# Patient Record
Sex: Female | Born: 1965 | Race: Black or African American | Hispanic: No | Marital: Married | Smoking: Current every day smoker
Health system: Southern US, Community
[De-identification: ages and names within clinical notes are randomized; demographics above are authoritative.]

## PROBLEM LIST (undated history)

## (undated) ENCOUNTER — Emergency Department (HOSPITAL_COMMUNITY): Admission: EM | Payer: Self-pay | Source: Home / Self Care

## (undated) DIAGNOSIS — I10 Essential (primary) hypertension: Secondary | ICD-10-CM

## (undated) DIAGNOSIS — E039 Hypothyroidism, unspecified: Secondary | ICD-10-CM

## (undated) DIAGNOSIS — R87629 Unspecified abnormal cytological findings in specimens from vagina: Secondary | ICD-10-CM

## (undated) DIAGNOSIS — E7800 Pure hypercholesterolemia, unspecified: Secondary | ICD-10-CM

## (undated) DIAGNOSIS — I639 Cerebral infarction, unspecified: Secondary | ICD-10-CM

## (undated) HISTORY — DX: Unspecified abnormal cytological findings in specimens from vagina: R87.629

## (undated) HISTORY — PX: DILATION AND CURETTAGE OF UTERUS: SHX78

---

## 2001-10-21 ENCOUNTER — Emergency Department (HOSPITAL_COMMUNITY): Admission: EM | Admit: 2001-10-21 | Discharge: 2001-10-21 | Payer: Self-pay | Admitting: Emergency Medicine

## 2003-07-05 ENCOUNTER — Emergency Department (HOSPITAL_COMMUNITY): Admission: EM | Admit: 2003-07-05 | Discharge: 2003-07-05 | Payer: Self-pay | Admitting: Emergency Medicine

## 2003-08-28 ENCOUNTER — Emergency Department (HOSPITAL_COMMUNITY): Admission: EM | Admit: 2003-08-28 | Discharge: 2003-08-28 | Payer: Self-pay | Admitting: Emergency Medicine

## 2003-09-01 ENCOUNTER — Emergency Department (HOSPITAL_COMMUNITY): Admission: EM | Admit: 2003-09-01 | Discharge: 2003-09-01 | Payer: Self-pay | Admitting: Emergency Medicine

## 2005-07-19 ENCOUNTER — Emergency Department (HOSPITAL_COMMUNITY): Admission: EM | Admit: 2005-07-19 | Discharge: 2005-07-20 | Payer: Self-pay | Admitting: Emergency Medicine

## 2008-12-31 ENCOUNTER — Emergency Department (HOSPITAL_COMMUNITY): Admission: EM | Admit: 2008-12-31 | Discharge: 2008-12-31 | Payer: Self-pay | Admitting: Emergency Medicine

## 2009-10-29 ENCOUNTER — Emergency Department (HOSPITAL_COMMUNITY): Admission: EM | Admit: 2009-10-29 | Discharge: 2009-10-30 | Payer: Self-pay | Admitting: Emergency Medicine

## 2010-01-27 ENCOUNTER — Emergency Department (HOSPITAL_COMMUNITY): Admission: EM | Admit: 2010-01-27 | Discharge: 2010-01-27 | Payer: Self-pay | Admitting: Emergency Medicine

## 2011-04-07 ENCOUNTER — Emergency Department (HOSPITAL_COMMUNITY): Payer: Self-pay

## 2011-04-07 ENCOUNTER — Inpatient Hospital Stay (HOSPITAL_COMMUNITY)
Admission: EM | Admit: 2011-04-07 | Discharge: 2011-04-10 | DRG: 065 | Disposition: A | Discharge status: Home / Self Care | Payer: Self-pay | Attending: Internal Medicine | Admitting: Internal Medicine

## 2011-04-07 ENCOUNTER — Other Ambulatory Visit: Payer: Self-pay

## 2011-04-07 DIAGNOSIS — I639 Cerebral infarction, unspecified: Secondary | ICD-10-CM | POA: Diagnosis present

## 2011-04-07 DIAGNOSIS — Z7902 Long term (current) use of antithrombotics/antiplatelets: Secondary | ICD-10-CM

## 2011-04-07 DIAGNOSIS — R29898 Other symptoms and signs involving the musculoskeletal system: Secondary | ICD-10-CM | POA: Diagnosis present

## 2011-04-07 DIAGNOSIS — Z6841 Body Mass Index (BMI) 40.0 and over, adult: Secondary | ICD-10-CM

## 2011-04-07 DIAGNOSIS — F17200 Nicotine dependence, unspecified, uncomplicated: Secondary | ICD-10-CM | POA: Diagnosis present

## 2011-04-07 DIAGNOSIS — Z7982 Long term (current) use of aspirin: Secondary | ICD-10-CM

## 2011-04-07 DIAGNOSIS — E669 Obesity, unspecified: Secondary | ICD-10-CM | POA: Diagnosis present

## 2011-04-07 DIAGNOSIS — I6529 Occlusion and stenosis of unspecified carotid artery: Secondary | ICD-10-CM | POA: Diagnosis present

## 2011-04-07 DIAGNOSIS — Z79899 Other long term (current) drug therapy: Secondary | ICD-10-CM

## 2011-04-07 DIAGNOSIS — I63239 Cerebral infarction due to unspecified occlusion or stenosis of unspecified carotid arteries: Principal | ICD-10-CM | POA: Diagnosis present

## 2011-04-07 DIAGNOSIS — I10 Essential (primary) hypertension: Secondary | ICD-10-CM | POA: Diagnosis present

## 2011-04-07 DIAGNOSIS — E785 Hyperlipidemia, unspecified: Secondary | ICD-10-CM | POA: Diagnosis present

## 2011-04-07 LAB — BASIC METABOLIC PANEL
BUN: 5 mg/dL — ABNORMAL LOW (ref 6–23)
Chloride: 105 mEq/L (ref 96–112)
GFR calc non Af Amer: >90 mL/min (ref >90)
Glucose, Bld: 94 mg/dL (ref 70–99)
Potassium: 3.5 mEq/L (ref 3.5–5.1)
Sodium: 137 mEq/L (ref 135–145)

## 2011-04-07 LAB — CBC
Hemoglobin: 12.4 g/dL (ref 12.0–15.0)
Platelets: 367 10*3/uL (ref 150–400)
RBC: 4.46 MIL/uL (ref 3.87–5.11)
WBC: 7.9 10*3/uL (ref 4.0–10.5)

## 2011-04-07 LAB — DIFFERENTIAL
Eosinophils Absolute: 0 10*3/uL (ref 0.0–0.7)
Lymphocytes Relative: 37 % (ref 12–46)
Lymphs Abs: 2.9 10*3/uL (ref 0.7–4.0)
Monocytes Relative: 5 % (ref 3–12)
Neutro Abs: 4.6 10*3/uL (ref 1.7–7.7)
Neutrophils Relative %: 58 % (ref 43–77)

## 2011-04-07 MED ORDER — ACETAMINOPHEN 325 MG PO TABS
650.0000 mg | ORAL_TABLET | ORAL | Status: DC | PRN
  Administered 2011-04-08: 650 mg via ORAL
  Filled 2011-04-07: qty 2

## 2011-04-07 MED ORDER — SENNOSIDES-DOCUSATE SODIUM 8.6-50 MG PO TABS: 1.0000 | ORAL_TABLET | Freq: Every evening | ORAL | Status: DC | PRN

## 2011-04-07 MED ORDER — IOHEXOL 350 MG/ML SOLN
80.0000 mL | Freq: Once | INTRAVENOUS | Status: AC | PRN
  Administered 2011-04-07: 80 mL via INTRAVENOUS

## 2011-04-07 MED ORDER — ASPIRIN 81 MG PO CHEW
324.0000 mg | CHEWABLE_TABLET | Freq: Once | ORAL | Status: AC
  Administered 2011-04-07: 324 mg via ORAL
  Filled 2011-04-07: qty 4

## 2011-04-07 MED ORDER — NICOTINE 7 MG/24HR TD PT24
7.0000 mg | MEDICATED_PATCH | Freq: Every day | TRANSDERMAL | Status: DC
  Administered 2011-04-07 – 2011-04-09 (×3): 7 mg via TRANSDERMAL
  Filled 2011-04-07 (×3): qty 1

## 2011-04-07 MED ORDER — SODIUM CHLORIDE 0.9 % IJ SOLN: 3.0000 mL | INTRAMUSCULAR | Status: DC | PRN

## 2011-04-07 MED ORDER — ASPIRIN 81 MG PO CHEW
324.0000 mg | CHEWABLE_TABLET | Freq: Every day | ORAL | Status: DC
  Administered 2011-04-08: 324 mg via ORAL
  Filled 2011-04-07: qty 4

## 2011-04-07 MED ORDER — SODIUM CHLORIDE 0.9 % IV SOLN: 250.0000 mL | INTRAVENOUS | Status: DC | PRN

## 2011-04-07 MED ORDER — SODIUM CHLORIDE 0.9 % IJ SOLN
3.0000 mL | Freq: Two times a day (BID) | INTRAMUSCULAR | Status: DC
  Administered 2011-04-07 – 2011-04-09 (×4): 3 mL via INTRAVENOUS

## 2011-04-07 MED ORDER — ACETAMINOPHEN 650 MG RE SUPP: 650.0000 mg | RECTAL | Status: DC | PRN

## 2011-04-07 NOTE — ED Notes (Signed)
Complain of weakness in left arm 2 days

## 2011-04-07 NOTE — ED Notes (Signed)
Family at bedside. EDP speaking with pt's daughter at this time

## 2011-04-07 NOTE — ED Notes (Signed)
Dr. Onalee Hua has spoken with pt and pt now to be transported to ct

## 2011-04-07 NOTE — H&P (Signed)
Chief Complaint:  Left arm and hand weakness  HPI: 46 year old African American female with only a questionable history of hypertension in the past but is not taking any medications and tobacco abuse who presents to the emergency department after noticing some left upper extremity weakness and numbness since yesterday morning. She has never previously had any cardiovascular disease she states that she's having difficulty in grasping things with her left hand. She denies any facial drooping or slurred speech. She denies any problems with her gait or any weakness or numbness in her lower extremities. Her right upper extremity has been functioning normally. She denies any blurred vision any difficulty in swallowing or any headache. She denies any recent illnesses no fever, nausea, vomiting, diarrhea, rashes.  Review of Systems:  Otherwise negative  Past Medical History: History reviewed. No pertinent past medical history. History reviewed. No pertinent past surgical history.  Medications: Prior to Admission medications   Not on File    Allergies:  No Known Allergies  Social History:  reports that she has been smoking Cigarettes.  She does not have any smokeless tobacco history on file. She reports that she does not drink alcohol or use illicit drugs.  Family History: History reviewed. No pertinent family history.  Physical Exam: Filed Vitals:   04/07/11 1322 04/07/11 1844 04/07/11 1845 04/07/11 1945  BP: 166/98  151/99 171/90  Pulse: 88  84 85  Temp: 98.4 F (36.9 C) 98 F (36.7 C) 98 F (36.7 C)   TempSrc: Oral  Oral   Resp: 20   18  Height: 5\' 6"  (1.676 m)     Weight: 117.935 kg (260 lb)     SpO2: 98%  100% 100%   BP 171/90  Pulse 85  Temp(Src) 98 F (36.7 C) (Oral)  Resp 18  Ht 5\' 6"  (1.676 m)  Wt 117.935 kg (260 lb)  BMI 41.97 kg/m2  SpO2 100%  LMP 03/27/2011 General appearance: alert, cooperative and no distress Lungs: clear to auscultation bilaterally Heart:  regular rate and rhythm, S1, S2 normal, no murmur, click, rub or gallop Abdomen: soft, non-tender; bowel sounds normal; no masses,  no organomegaly Extremities: extremities normal, atraumatic, no cyanosis or edema Pulses: 2+ and symmetric Skin: Skin color, texture, turgor normal. No rashes or lesions Neurologic: Mental status: Alert, oriented, thought content appropriate Cranial nerves: normal Motor: decreased strength lue 4/5 with decreased hand grasp compared to right arm(s) left  Reflexes: 2+ and symmetric    Labs on Admission:   [facility] 04/07/11 1712  NA 137  K 3.5  CL 105  CO2 27  GLUCOSE 94  BUN 5*  CREATININE 0.67  CALCIUM 10.1  MG --  PHOS --    Basename 04/07/11 1712  WBC 7.9  NEUTROABS 4.6  HGB 12.4  HCT 39.0  MCV 87.4  PLT 367    Radiological Exams on Admission: Mr Brain Wo Contrast  04/07/2011  *RADIOLOGY REPORT*  Clinical Data: Weakness of the left arm and leg for 2 days.  MRI HEAD WITHOUT CONTRAST  Technique:  Multiplanar, multiecho pulse sequences of the brain and surrounding structures were obtained according to standard protocol without intravenous contrast.  Comparison: None.  Findings: There is right common carotid occlusion with an absence of flow in that vessel.  There are areas of acute infarction affecting the right basal ganglia, anterior limb internal capsule, scattered foci within the right middle cerebral artery territory and watershed infarctions along the margins of the anterior and middle cerebral arteries on  the right.  There is no abnormality of the brainstem or cerebellum.  There is no abnormality of the left cerebral hemisphere.  No evidence of hemorrhage.  No significant swelling or mass effect/shift.  No mass lesion, hydrocephalus or extra-axial collection.  No pituitary mass.  Sinuses, middle ears and mastoids are clear.  IMPRESSION: Right carotid artery occlusion. Acute watershed zone infarctions throughout the right hemisphere along the  margins of the anterior and middle cerebral artery territories.  Scattered small cortical infarctions in the right middle cerebral artery territory.  Acute infarction in the right basal ganglia and anterior limb internal capsule.  Original Report Authenticated By: Thomasenia Sales, M.D.    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 EDP Dr. Garnetta Buddy has already notified neurology Dr. Pearlean Brownie at [facility] who recommended patient to be transferred there for further evaluation and treatment. We'll obtain frequent neurological checks placed on telemetry floor. She is currently getting a CTA of her head and neck here at [facility].  to better evaluate any stenosis. I have written orders for the [facility] staff to notify both [facility] hospitalist service and neurology service on her arrival to their floor. I've also spoken to triad hospitalist at [facility] about her case.  .Carotid artery occlusion CTA head and neck pending further recommendations per neurology service at [facility].  .Left hand weakness .HTN (hypertension) parameters have been placed would not aggressively decreased at this point due to the above issues.  Patient be on [place] team  #4 and is a full code and is currently hemodynamically stable.   Sherilynn Dieu A [phone] 04/07/2011, 8:35 PM

## 2011-04-07 NOTE — ED Notes (Signed)
Pt left shoes, [facility] had already left so shoes given to pt's daughter

## 2011-04-07 NOTE — ED Notes (Signed)
Report taken from [place], pt requesting to go outside and smoke, pt advised of no smoking policy EDP in to speak with pt

## 2011-04-07 NOTE — ED Notes (Signed)
Pt still unable to move left arm

## 2011-04-07 NOTE — ED Provider Notes (Signed)
History     CSN: [id number]  Arrival date & time 04/07/11  1305   First MD Initiated Contact with Patient 04/07/11 1723      Chief Complaint  Patient presents with  . Arm Pain    Patient is a 46 y.o. female presenting with extremity weakness. The history is provided by the patient.  Extremity Weakness This is a new problem. The current episode started yesterday. The problem occurs constantly. The problem has been gradually worsening. Associated symptoms include headaches. Pertinent negatives include no chest pain, no abdominal pain and no shortness of breath. The symptoms are aggravated by nothing. The symptoms are relieved by nothing.  Pt reports that she woke up yesterday morning with left UE weakness No numbness No trauma/fall No neck pain No back pain No cp/sob No leg weakness No numbness to left UE, just reported difficulty gripping with left hand Never had this before Denies h/o CAD/CVA   PMH - none  History reviewed. No pertinent past surgical history.  History reviewed. No pertinent family history.  History  Substance Use Topics  . Smoking status: Current Everyday Smoker    Types: Cigarettes  . Smokeless tobacco: Not on file  . Alcohol Use: No    OB History    Grav Para Term Preterm Abortions TAB SAB Ect Mult Living                  Review of Systems  Respiratory: Negative for shortness of breath.   Cardiovascular: Negative for chest pain.  Gastrointestinal: Negative for abdominal pain.  Musculoskeletal: Positive for extremity weakness.  Neurological: Positive for headaches.  All other systems reviewed and are negative.    Allergies  Review of patient's allergies indicates no known allergies.  Home Medications  No current outpatient prescriptions on file.  BP 166/98  Pulse 88  Temp(Src) 98.4 F (36.9 C) (Oral)  Resp 20  Ht 5\' 6"  (1.676 m)  Wt 260 lb (117.935 kg)  BMI 41.97 kg/m2  SpO2 98%  LMP 03/27/2011  Physical  Exam  CONSTITUTIONAL: Well developed/well nourished HEAD AND FACE: Normocephalic/atraumatic EYES: EOMI/PERRL ENMT: Mucous membranes moist NECK: supple no meningeal signs SPINE:entire spine nontender, mild cspine tenderness with ROM of neck CV: S1/S2 noted, no murmurs/rubs/gallops noted LUNGS: Lungs are clear to auscultation bilaterally, no apparent distress ABDOMEN: soft, nontender, no rebound or guarding GU:no cva tenderness NEURO: Pt is awake/alert, gait normal  facies symmetric, no leg drift is noted Cranial nerves 3/4/5/6/10/10/08/11/12 tested and intact No visual deficit is noted She has some drift of left UE against gravity.  She equal power with left elbow flexion/extension.  She has some weakness noted with grip of left hand and is unable to fully extend left 4th/5th finger due to weakness There is no sensory deficit in the left UE EXTREMITIES: pulses normal/equal, full ROM SKIN: warm, color normal PSYCH: no abnormalities of mood noted   ED Course  Procedures    Labs Reviewed  CBC  DIFFERENTIAL  BASIC METABOLIC PANEL   4:09 PM tPA in stroke considered but not given due to:  Onset over 3-4.5hours  Pt without any other neurologic symptoms except weakness in left UE.  ? Cervical radiculopathy, will obtain mr brain/cspine  7:06 PM Mr brain shows CVA, likely right carotid occlusion Pt has no new symptoms Vascular paged, will also likely need neuro consult  7:14 PM D/w vascular, no acute intervention at this point would defer to neuro at this point Awaiting call back from neuro  7:37 PM D/w dr Pearlean Brownie, neuro can be admitted to [facility] via hospitalist, obtain CT head/neck angio with/without contrast  7:46 PM D/w triad, will admit/transfer to [facility]  MDM  Nursing notes reviewed and considered in documentation All labs/vitals reviewed and considered        Date: 04/07/2011  Rate: 81  Rhythm: normal sinus rhythm  QRS Axis: normal  Intervals: normal  ST/T  Wave abnormalities: normal  Conduction Disutrbances:none  Narrative Interpretation:      Joya Gaskins, MD 04/07/11 1946

## 2011-04-08 ENCOUNTER — Inpatient Hospital Stay (HOSPITAL_COMMUNITY): Payer: Self-pay

## 2011-04-08 LAB — HEMOGLOBIN A1C
Hgb A1c MFr Bld: 5.2 % (ref <5.7)
Mean Plasma Glucose: 103 mg/dL (ref <117)

## 2011-04-08 LAB — LIPID PANEL
HDL: 37 mg/dL — ABNORMAL LOW (ref >39)
Triglycerides: 114 mg/dL (ref <150)

## 2011-04-08 MED ORDER — CLOPIDOGREL BISULFATE 75 MG PO TABS
75.0000 mg | ORAL_TABLET | Freq: Every day | ORAL | Status: DC
  Administered 2011-04-09 – 2011-04-10 (×2): 75 mg via ORAL
  Filled 2011-04-08 (×3): qty 1

## 2011-04-08 MED ORDER — SIMVASTATIN 40 MG PO TABS
40.0000 mg | ORAL_TABLET | Freq: Every day | ORAL | Status: DC
  Administered 2011-04-08 – 2011-04-09 (×2): 40 mg via ORAL
  Filled 2011-04-08 (×3): qty 1

## 2011-04-08 MED ORDER — ASPIRIN 81 MG PO CHEW
81.0000 mg | CHEWABLE_TABLET | Freq: Every day | ORAL | Status: DC
  Administered 2011-04-09 – 2011-04-10 (×2): 81 mg via ORAL
  Filled 2011-04-08 (×2): qty 1

## 2011-04-08 NOTE — Progress Notes (Signed)
Physical Therapy Evaluation Patient Details Name: [name] MRN: [id number] DOB: 03/15/1966 Today's Date: 04/08/2011  Problem List:  Patient Active Problem List  Diagnoses  . CVA (cerebral infarction)  . Carotid artery occlusion  . Left hand weakness  . HTN (hypertension)    Past Medical History: History reviewed. No pertinent past medical history. Past Surgical History: History reviewed. No pertinent past surgical history.  PT Assessment/Plan/Recommendation PT Assessment Clinical Impression Statement: Patient is a 46 yo female admitted with right CVA with left hemiparesis.  Patient with decerased balance and gait abnormality.  Recommend follow-up OP PT to address these issues. PT Recommendation/Assessment: Patient will need skilled PT in the acute care venue PT Problem List: Decreased strength;Decreased balance;Decreased coordination;Decreased knowledge of use of DME PT Therapy Diagnosis : Abnormality of gait;Hemiplegia non-dominant side PT Plan PT Frequency: Min 4X/week PT Treatment/Interventions: DME instruction;Gait training;Stair training;Functional mobility training;Balance training;Patient/family education PT Recommendation Follow Up Recommendations: Outpatient PT Equipment Recommended: Cane PT Goals  Acute Rehab PT Goals PT Goal Formulation: With patient Time For Goal Achievement: 7 days Pt will go Supine/Side to Sit: Independently;with HOB 0 degrees PT Goal: Supine/Side to Sit - Progress: Not met Pt will go Sit to Stand: with modified independence PT Goal: Sit to Stand - Progress: Not met Pt will go Stand to Sit: with modified independence PT Goal: Stand to Sit - Progress: Not met Pt will Ambulate: >150 feet;with modified independence;with least restrictive assistive device (without loss of balance) PT Goal: Ambulate - Progress: Not met Pt will Go Up / Down Stairs: 3-5 stairs;with supervision;with least restrictive assistive device PT Goal: Up/Down Stairs -  Progress: Not met  PT Evaluation Precautions/Restrictions  Precautions Precautions: Fall Required Braces or Orthoses: No Restrictions Weight Bearing Restrictions: No Prior Functioning  Home Living Lives With: Family Type of Home: House Home Layout: One level Home Access: Stairs to enter Entrance Stairs-Rails: Right Entrance Stairs-Number of Steps: 3 Bathroom Shower/Tub: Forensic scientist: Standard Bathroom Accessibility: Yes How Accessible: Accessible via wheelchair;Accessible via walker Home Adaptive Equipment: None Prior Function Level of Independence: Independent with basic ADLs;Independent with homemaking with ambulation;Independent with gait Driving: Yes Vocation: Full time employment Cognition Cognition Arousal/Alertness: Awake/alert Overall Cognitive Status: Appears within functional limits for tasks assessed Orientation Level: Oriented X4 Cognition - Other Comments: Flat affect Sensation/Coordination Sensation Light Touch: Appears Intact Coordination Gross Motor Movements are Fluid and Coordinated: No Coordination and Movement Description: Decreased control of LLE during gait.  See OT note for LUE Extremity Assessment LUE Assessment LUE Assessment:  (See OT note) RLE Assessment RLE Assessment: Within Functional Limits LLE Assessment LLE Assessment: Within Functional Limits (Decreased strength/control hip/knee in closed chain) Mobility (including Balance) Bed Mobility Bed Mobility: No Transfers Transfers: Yes Sit to Stand: 5: Supervision;With upper extremity assist;With armrests;From chair/3-in-1 Sit to Stand Details (indicate cue type and reason): Cues to move slowly for safety Stand to Sit: 5: Supervision;With upper extremity assist;With armrests;To chair/3-in-1 Stand to Sit Details: Patient "flopped" back into chair.  Discussed safety and need to slowly lower self to chair. Ambulation/Gait Ambulation/Gait: Yes Ambulation/Gait  Assistance: 4: Min assist Ambulation/Gait Assistance Details (indicate cue type and reason): Patient requiring min assist to avoid obstacles in hallway.  Patient ran into wall computers, door jam, and bedside table during gait.  States she could see objects, but had trouble controlling gait to miss objects.  Patient also with decreased control of left knee during stance - knee "popping" back into extension.  Also noted hip instability on left.  Ambulation Distance (Feet): 250 Feet Assistive device: None Gait Pattern: Step-through pattern;Decreased stance time - left;Lateral hip instability (Decreased control left knee in stance) Gait velocity: Slow gait speed  Posture/Postural Control Posture/Postural Control: No significant limitations High Level Balance High Level Balance Activites: Direction changes;Turns;Sudden stops;Head turns High Level Balance Comments: Decrease balance and alteration of gait during high level balance activities Exercise    End of Session PT - End of Session Equipment Utilized During Treatment: Gait belt Activity Tolerance: Patient tolerated treatment well Patient left: in chair;with call bell in reach Nurse Communication: Mobility status for ambulation General Behavior During Session: [facility] for tasks performed Cognition: [facility] for tasks performed  Elizabeth, Frost 04/08/2011, 4:58 PM

## 2011-04-08 NOTE — Progress Notes (Signed)
Nursing- Pt arrived via [facility]. VSS. Pt in no signs of distress. Assessment with NIH completed. MD notified of pt arrival per order. Patient oriented to room and placed on telemetry. Pt continued to ask if she could have a cigarette. I made her aware of the anti-smoking policy, and informed her I would try to get a nicotine patch ordered from the MD.  Pt resting comfortably in bed. Will cont to monitor

## 2011-04-08 NOTE — Progress Notes (Signed)
Utilization review completed. [name] 04/08/2011

## 2011-04-08 NOTE — Progress Notes (Signed)
Subjective: [name] was admitted with left arm weakness. Left arm is still weak but seems to be somewhat better. 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 She is a smoker. She is obese. She is hypertensive. She is hyperlipidemic.           Physical Exam: Blood pressure 142/94, pulse 68, temperature 98 F (36.7 C), temperature source Oral, resp. rate 18, height 5\' 6"  (1.676 m), weight 117.935 kg (260 lb), last menstrual period 03/27/2011, SpO2 100.00%. She does look systemically well. She is alert and orientated. She does have left arm weakness and a clear pronator drift. The left leg is normal strength. There is no facial weakness. Heart sounds are present and normal. Lung fields are clear.   Investigations:     Basic Metabolic Panel:  Basename 04/07/11 1712  NA 137  K 3.5  CL 105  CO2 27  GLUCOSE 94  BUN 5*  CREATININE 0.67  CALCIUM 10.1  MG --  PHOS --       CBC:  Basename 04/07/11 1712  WBC 7.9  NEUTROABS 4.6  HGB 12.4  HCT 39.0  MCV 87.4  PLT 367    Ct Angio Head W/cm &/or Wo Cm  04/08/2011  *RADIOLOGY REPORT*  Clinical Data:  The right carotid occlusion.  Left arm weakness.  CT ANGIOGRAPHY HEAD AND NECK  Technique:  Multidetector CT imaging of the head and neck was performed using the standard protocol during bolus administration of intravenous contrast.  Multiplanar CT image reconstructions including MIPs were obtained to evaluate the vascular anatomy. Carotid stenosis measurements (when applicable) are obtained utilizing NASCET criteria, using the distal internal carotid diameter as the denominator.  Contrast: 80mL OMNIPAQUE IOHEXOL 350 MG/ML IV SOLN  Comparison:  MRI brain 04/06/2010.  CTA NECK  Findings: The left vertebral artery originates from the aortic arch, a normal variant.  The right vertebral artery originates from the subclavian.  There is some streak artifact across the arch area is secondary patient body habitus.  No focal  stenosis is evident.  The vertebral arteries are within normal limits throughout the remainder of the neck.  The right vertebral artery is slightly dominant to the left.  No focal stenosis is evident.  The right common carotid artery is within normal limits.  The right internal carotid artery is occluded 8 mm from the bifurcation. There is no proximal reconstitution.  The external carotid artery is within normal limits.  The left common carotid artery is within normal limits.  The bifurcation is unremarkable.  The left internal carotid artery is normal throughout the neck.  Mild degenerative changes are present at C5-6 without significant stenosis.   Review of the MIP images confirms the above findings.  IMPRESSION:  1.  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 2.  The left vertebral artery originates from the aortic arch, a normal variant. 3.  Normal appearance of the left carotid artery.  CTA HEAD  Findings:  The right caudate head and watershed territory infarcts are redemonstrated.  No hemorrhage or mass lesion is present.  The right internal carotid artery is occluded.  It is reconstituted at the level of the ophthalmic artery.  The A1 and M1 segments fill normally on both sides.  The MCA bifurcations are within normal limits.  There is significant attenuation of MCA branch vessels bilaterally, more prominently on the right.  The right vertebral artery is the dominant vessel.  The basilar artery is within normal  limits.  The PICA origins are not discretely visualized.  Both posterior cerebral arteries originate from basilar tip.  No left posterior communicating artery is present.  No definite right posterior communicating artery is seen. The there is some attenuation of distal PCA branch vessels bilaterally.   Review of the MIP images confirms the above findings.  IMPRESSION:  1.  Occluded right internal carotid artery is reconstituted at the level of the ophthalmic artery. 2.   Moderate small vessel disease. 3.  Significant asymmetric attenuation of right MCA branches versus the left, compatible with diminished perfusion.  Original Report Authenticated By: Jamesetta Orleans. MATTERN, M.D.   Dg Chest 2 View  04/08/2011  *RADIOLOGY REPORT*  Clinical Data: Headache.  Stroke.  CHEST - 2 VIEW  Comparison: 04/08/2011  Findings: Heart size is normal.  No pleural effusion or pulmonary edema.  There is no airspace consolidation identified.  Review of the visualized osseous structures is unremarkable.  IMPRESSION:  1.  No active cardiopulmonary abnormalities.  Original Report Authenticated By: Rosealee Albee, M.D.   Ct Angio Neck W/cm &/or Wo/cm  04/08/2011  *RADIOLOGY REPORT*  Clinical Data:  The right carotid occlusion.  Left arm weakness.  CT ANGIOGRAPHY HEAD AND NECK  Technique:  Multidetector CT imaging of the head and neck was performed using the standard protocol during bolus administration of intravenous contrast.  Multiplanar CT image reconstructions including MIPs were obtained to evaluate the vascular anatomy. Carotid stenosis measurements (when applicable) are obtained utilizing NASCET criteria, using the distal internal carotid diameter as the denominator.  Contrast: 80mL OMNIPAQUE IOHEXOL 350 MG/ML IV SOLN  Comparison:  MRI brain 04/06/2010.  CTA NECK  Findings: The left vertebral artery originates from the aortic arch, a normal variant.  The right vertebral artery originates from the subclavian.  There is some streak artifact across the arch area is secondary patient body habitus.  No focal stenosis is evident.  The vertebral arteries are within normal limits throughout the remainder of the neck.  The right vertebral artery is slightly dominant to the left.  No focal stenosis is evident.  The right common carotid artery is within normal limits.  The right internal carotid artery is occluded 8 mm from the bifurcation. There is no proximal reconstitution.  The external carotid artery  is within normal limits.  The left common carotid artery is within normal limits.  The bifurcation is unremarkable.  The left internal carotid artery is normal throughout the neck.  Mild degenerative changes are present at C5-6 without significant stenosis.   Review of the MIP images confirms the above findings.  IMPRESSION:  1.  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 2.  The left vertebral artery originates from the aortic arch, a normal variant. 3.  Normal appearance of the left carotid artery.  CTA HEAD  Findings:  The right caudate head and watershed territory infarcts are redemonstrated.  No hemorrhage or mass lesion is present.  The right internal carotid artery is occluded.  It is reconstituted at the level of the ophthalmic artery.  The A1 and M1 segments fill normally on both sides.  The MCA bifurcations are within normal limits.  There is significant attenuation of MCA branch vessels bilaterally, more prominently on the right.  The right vertebral artery is the dominant vessel.  The basilar artery is within normal limits.  The PICA origins are not discretely visualized.  Both posterior cerebral arteries originate from basilar tip.  No left posterior communicating  artery is present.  No definite right posterior communicating artery is seen. The there is some attenuation of distal PCA branch vessels bilaterally.   Review of the MIP images confirms the above findings.  IMPRESSION:  1.  Occluded right internal carotid artery is reconstituted at the level of the ophthalmic artery. 2.  Moderate small vessel disease. 3.  Significant asymmetric attenuation of right MCA branches versus the left, compatible with diminished perfusion.  Original Report Authenticated By: Jamesetta Orleans. MATTERN, M.D.   Mr Brain Wo Contrast  04/07/2011  *RADIOLOGY REPORT*  Clinical Data: Weakness of the left arm and leg for 2 days.  MRI HEAD WITHOUT CONTRAST  Technique:  Multiplanar, multiecho pulse  sequences of the brain and surrounding structures were obtained according to standard protocol without intravenous contrast.  Comparison: None.  Findings: There is right common carotid occlusion with an absence of flow in that vessel.  There are areas of acute infarction affecting the right basal ganglia, anterior limb internal capsule, scattered foci within the right middle cerebral artery territory and watershed infarctions along the margins of the anterior and middle cerebral arteries on the right.  There is no abnormality of the brainstem or cerebellum.  There is no abnormality of the left cerebral hemisphere.  No evidence of hemorrhage.  No significant swelling or mass effect/shift.  No mass lesion, hydrocephalus or extra-axial collection.  No pituitary mass.  Sinuses, middle ears and mastoids are clear.  IMPRESSION: Right carotid artery occlusion. Acute watershed zone infarctions throughout the right hemisphere along the margins of the anterior and middle cerebral artery territories.  Scattered small cortical infarctions in the right middle cerebral artery territory.  Acute infarction in the right basal ganglia and anterior limb internal capsule.  Original Report Authenticated By: Thomasenia Sales, M.D.      Medications: I have reviewed the patient's current medications.  Impression: 1. 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 2. Obesity. 3. Hypertension. 4. Hyperlipidemia. 5. Right carotid artery occlusion. 6. Tobacco abuse, ongoing.     Plan: 1. Agree with aspirin. 2. Start simvastatin for hyperlipidemia. 3. Await neurology consultation. 4. I've discussed at length with this patient regarding tobacco abuse and her obesity and strategies for losing weight.     LOS: 1 day   Wilson Singer Pager [phone]  04/08/2011, 9:46 AM

## 2011-04-08 NOTE — Consult Note (Signed)
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 Who developed sudden onset of left hand weakness 3 days ago upon arising from sleep. She chose not to seek medical advice until 2 days later when she came to [facility] yesterday. 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 She has remained stable since arrival here but continues to have left distal hand weakness. She denies any headache, blurred vision, vertigo, diplopia date of balance problems. She has no prior history of strokes TIAs migraines or any significant neurological problems.  Stroke risk factors include smoking, obesity and probably hypertension. She admits to smoking marijuana often on but denies other drug abuse. TPA considered : No as presented late    History reviewed. No pertinent past medical history.  History reviewed. No pertinent past surgical history.  History reviewed. No pertinent family history.  Social History:  reports that she has been smoking Cigarettes.  She does not have any smokeless tobacco history on file. She reports that she does not drink alcohol or use illicit drugs.  Allergies: No Known Allergies  Medications: I have reviewed the patient's current medications.  Results for orders placed during the hospital encounter of 04/07/11 (from the past 48 hour(s))  CBC     Status: Normal   Collection Time   04/07/11  5:12 PM      Component Value Range Comment   WBC 7.9  4.0 - 10.5 (K/uL)    RBC 4.46  3.87 - 5.11 (MIL/uL)    Hemoglobin 12.4  12.0 - 15.0 (g/dL)    HCT 16.1  09.6 - 04.5 (%)    MCV 87.4  78.0 - 100.0 (fL)    MCH 27.8  26.0 - 34.0 (pg)    MCHC 31.8  30.0 - 36.0 (g/dL)    RDW 40.9  81.1 - 91.4 (%)    Platelets 367  150 - 400 (K/uL)   DIFFERENTIAL     Status: Normal   Collection Time   04/07/11  5:12 PM      Component Value Range Comment   Neutrophils Relative 58  43 - 77 (%)    Neutro Abs 4.6  1.7 - 7.7 (K/uL)    Lymphocytes Relative 37  12 - 46 (%)    Lymphs Abs 2.9  0.7 - 4.0 (K/uL)    Monocytes Relative 5  3 - 12 (%)    Monocytes Absolute 0.4  0.1 - 1.0 (K/uL)    Eosinophils Relative 1  0 - 5 (%)    Eosinophils Absolute 0.0  0.0 - 0.7 (K/uL)    Basophils Relative 1  0 - 1 (%)    Basophils Absolute 0.0  0.0 - 0.1 (K/uL)   BASIC METABOLIC PANEL     Status: Abnormal   Collection Time   04/07/11  5:12 PM      Component Value Range Comment   Sodium 137  135 - 145 (mEq/L)    Potassium 3.5  3.5 - 5.1 (mEq/L)    Chloride 105  96 - 112 (mEq/L)    CO2 27  19 - 32 (mEq/L)    Glucose, Bld 94  70 - 99 (mg/dL)    BUN 5 (*) 6 -  23 (mg/dL)    Creatinine, Ser 4.09  0.50 - 1.10 (mg/dL)    Calcium 81.1  8.4 - 10.5 (mg/dL)    GFR calc non Af Amer >90  >90 (mL/min)    GFR calc Af Amer >90  >90 (mL/min)   LIPID PANEL     Status: Abnormal   Collection Time   04/08/11  6:15 AM      Component Value Range Comment   Cholesterol 213 (*) 0 - 200 (mg/dL)    Triglycerides 914  <150 (mg/dL)    HDL 37 (*) >78 (mg/dL)    Total CHOL/HDL Ratio 5.8      VLDL 23  0 - 40 (mg/dL)    LDL Cholesterol 295 (*) 0 - 99 (mg/dL)     Ct Angio Head W/cm &/or Wo Cm  04/08/2011  *RADIOLOGY REPORT*  Clinical Data:  The right carotid occlusion.  Left arm weakness.  CT ANGIOGRAPHY HEAD AND NECK  Technique:  Multidetector CT imaging of the head and neck was performed using the standard protocol during bolus administration of intravenous contrast.  Multiplanar CT image reconstructions including MIPs were obtained to evaluate the vascular anatomy. Carotid stenosis measurements (when applicable) are obtained utilizing NASCET criteria, using the distal internal carotid diameter as the denominator.   Contrast: 80mL OMNIPAQUE IOHEXOL 350 MG/ML IV SOLN  Comparison:  MRI brain 04/06/2010.  CTA NECK  Findings: The left vertebral artery originates from the aortic arch, a normal variant.  The right vertebral artery originates from the subclavian.  There is some streak artifact across the arch area is secondary patient body habitus.  No focal stenosis is evident.  The vertebral arteries are within normal limits throughout the remainder of the neck.  The right vertebral artery is slightly dominant to the left.  No focal stenosis is evident.  The right common carotid artery is within normal limits.  The right internal carotid artery is occluded 8 mm from the bifurcation. There is no proximal reconstitution.  The external carotid artery is within normal limits.  The left common carotid artery is within normal limits.  The bifurcation is unremarkable.  The left internal carotid artery is normal throughout the neck.  Mild degenerative changes are present at C5-6 without significant stenosis.   Review of the MIP images confirms the above findings.  IMPRESSION:  1.  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 2.  The left vertebral artery originates from the aortic arch, a normal variant. 3.  Normal appearance of the left carotid artery.  CTA HEAD  Findings:  The right caudate head and watershed territory infarcts are redemonstrated.  No hemorrhage or mass lesion is present.  The right internal carotid artery is occluded.  It is reconstituted at the level of the ophthalmic artery.  The A1 and M1 segments fill normally on both sides.  The MCA bifurcations are within normal limits.  There is significant attenuation of MCA branch vessels bilaterally, more prominently on the right.  The right vertebral artery is the dominant vessel.  The basilar artery is within normal limits.  The PICA origins are not discretely visualized.  Both posterior cerebral arteries originate from basilar tip.  No left  posterior communicating artery is present.  No definite right posterior communicating artery is seen. The there is some attenuation of distal PCA branch vessels bilaterally.   Review of the MIP images confirms the above findings.  IMPRESSION:  1.  Occluded right internal carotid artery is reconstituted  at the level of the ophthalmic artery. 2.  Moderate small vessel disease. 3.  Significant asymmetric attenuation of right MCA branches versus the left, compatible with diminished perfusion.  Original Report Authenticated By: Jamesetta Orleans. MATTERN, M.D.   Dg Chest 2 View  04/08/2011  *RADIOLOGY REPORT*  Clinical Data: Headache.  Stroke.  CHEST - 2 VIEW  Comparison: 04/08/2011  Findings: Heart size is normal.  No pleural effusion or pulmonary edema.  There is no airspace consolidation identified.  Review of the visualized osseous structures is unremarkable.  IMPRESSION:  1.  No active cardiopulmonary abnormalities.  Original Report Authenticated By: Rosealee Albee, M.D.   Ct Angio Neck W/cm &/or Wo/cm  04/08/2011  *RADIOLOGY REPORT*  Clinical Data:  The right carotid occlusion.  Left arm weakness.  CT ANGIOGRAPHY HEAD AND NECK  Technique:  Multidetector CT imaging of the head and neck was performed using the standard protocol during bolus administration of intravenous contrast.  Multiplanar CT image reconstructions including MIPs were obtained to evaluate the vascular anatomy. Carotid stenosis measurements (when applicable) are obtained utilizing NASCET criteria, using the distal internal carotid diameter as the denominator.  Contrast: 80mL OMNIPAQUE IOHEXOL 350 MG/ML IV SOLN  Comparison:  MRI brain 04/06/2010.  CTA NECK  Findings: The left vertebral artery originates from the aortic arch, a normal variant.  The right vertebral artery originates from the subclavian.  There is some streak artifact across the arch area is secondary patient body habitus.  No focal stenosis is evident.  The vertebral arteries are  within normal limits throughout the remainder of the neck.  The right vertebral artery is slightly dominant to the left.  No focal stenosis is evident.  The right common carotid artery is within normal limits.  The right internal carotid artery is occluded 8 mm from the bifurcation. There is no proximal reconstitution.  The external carotid artery is within normal limits.  The left common carotid artery is within normal limits.  The bifurcation is unremarkable.  The left internal carotid artery is normal throughout the neck.  Mild degenerative changes are present at C5-6 without significant stenosis.   Review of the MIP images confirms the above findings.  IMPRESSION:  1.  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 2.  The left vertebral artery originates from the aortic arch, a normal variant. 3.  Normal appearance of the left carotid artery.  CTA HEAD  Findings:  The right caudate head and watershed territory infarcts are redemonstrated.  No hemorrhage or mass lesion is present.  The right internal carotid artery is occluded.  It is reconstituted at the level of the ophthalmic artery.  The A1 and M1 segments fill normally on both sides.  The MCA bifurcations are within normal limits.  There is significant attenuation of MCA branch vessels bilaterally, more prominently on the right.  The right vertebral artery is the dominant vessel.  The basilar artery is within normal limits.  The PICA origins are not discretely visualized.  Both posterior cerebral arteries originate from basilar tip.  No left posterior communicating artery is present.  No definite right posterior communicating artery is seen. The there is some attenuation of distal PCA branch vessels bilaterally.   Review of the MIP images confirms the above findings.  IMPRESSION:  1.  Occluded right internal carotid artery is reconstituted at the level of the ophthalmic artery. 2.  Moderate small vessel disease. 3.  Significant  asymmetric attenuation of right MCA branches versus  the left, compatible with diminished perfusion.  Original Report Authenticated By: Jamesetta Orleans. MATTERN, M.D.   Mr Brain Wo Contrast  04/07/2011  *RADIOLOGY REPORT*  Clinical Data: Weakness of the left arm and leg for 2 days.  MRI HEAD WITHOUT CONTRAST  Technique:  Multiplanar, multiecho pulse sequences of the brain and surrounding structures were obtained according to standard protocol without intravenous contrast.  Comparison: None.  Findings: There is right common carotid occlusion with an absence of flow in that vessel.  There are areas of acute infarction affecting the right basal ganglia, anterior limb internal capsule, scattered foci within the right middle cerebral artery territory and watershed infarctions along the margins of the anterior and middle cerebral arteries on the right.  There is no abnormality of the brainstem or cerebellum.  There is no abnormality of the left cerebral hemisphere.  No evidence of hemorrhage.  No significant swelling or mass effect/shift.  No mass lesion, hydrocephalus or extra-axial collection.  No pituitary mass.  Sinuses, middle ears and mastoids are clear.  IMPRESSION: Right carotid artery occlusion. Acute watershed zone infarctions throughout the right hemisphere along the margins of the anterior and middle cerebral artery territories.  Scattered small cortical infarctions in the right middle cerebral artery territory.  Acute infarction in the right basal ganglia and anterior limb internal capsule.  Original Report Authenticated By: Thomasenia Sales, M.D.       ROS  Negative for any chest pain, shortness of breath, palpitations, diarrhea, vomiting or weight loss. Blood pressure 138/84, pulse 80, temperature 97.9 F (36.6 C), temperature source Oral, resp. rate 18, height 5\' 6"  (1.676 m), weight 117.935 kg (260 lb), last menstrual period 03/27/2011, SpO2 98.00%.  PHYSICAL EXAM  Young African american obese  lady  currently not in distress. Afebrile. Head isn on traumatic. Neck is supple without bruit. Hearing is normal. Cardiac exam normal heart sounds, no murmur or gallop. Lungs are clear to auscultation. Abdomen soft nontender. Neurological exam : Awake  Alert oriented x 3. Normal speech and language.eye movements full without nystagmus. Face symmetric. Tongue midline. Normal strength, tone, reflexes and coordination except minimum left upper extruded disc and significant weakness of the left grip intrinsic and muscles and wrist extensors. No lower extremity weakness.. Normal sensation. Gait deferred.   NIH stroke scale 1. Modified Rankin scale 0 Assessment/Plan: 46 year old lady with right brain watershed infarcts secondary to extracranial right internal carotid artery occlusion. Etiology not clear atherosclerotic versus dissection  even though medical history is not suggestive of any neck injury.patient has prevented beyond window for intervention and appears neurologically stable.  Plan: Aspirin 81 mg a day plus Plavix 75 mg for the next 3 months followed by Plavix alone. I have counseled the patient to quit smoking . Check hemoglobin A1c and lipid profile. Outpatient evaluation for sleep apnea later. Physica and l occupational therapy consults. Mobilize out of bed. Check 2-D echocardiogram. I had a long discussion with the patient her mother and other family members in regards to her condition, plan for evaluation, treatment and prognosis and answered questions.    [name],PRAMODKUMAR P 04/08/2011, 12:33 PM

## 2011-04-08 NOTE — Progress Notes (Signed)
Occupational Therapy Evaluation Patient Details Name: [name] MRN: [id number] DOB: [birth year] Today's Date: 04/08/2011 11:22-12:00  evII Problem List:  Patient Active Problem List  Diagnoses  . CVA (cerebral infarction)  . Carotid artery occlusion  . Left hand weakness  . HTN (hypertension)    Past Medical History: History reviewed. No pertinent past medical history. Past Surgical History: History reviewed. No pertinent past surgical history.  OT Assessment/Plan/Recommendation OT Assessment Clinical Impression Statement: 46 year old with watershed CVA in the right hemisphere.  Pt currently presents with decreased LUE functional use,coordination, and strength.  Will benefit from acute OT services to help address this weakness as it has impacted pt's current independence with ADLS.  Anticipate pt will need outpatient OT at d'/c to help maximze independence. OT Recommendation/Assessment: Patient will need skilled OT in the acute care venue OT Problem List: Decreased strength;Decreased range of motion;Decreased coordination;Impaired UE functional use OT Therapy Diagnosis : Generalized weakness;Hemiplegia non-dominant side OT Plan OT Frequency: Min 2X/week OT Treatment/Interventions: Therapeutic exercise;Self-care/ADL training;Therapeutic activities;Neuromuscular education;Balance training;Patient/family education OT Recommendation Follow Up Recommendations: Outpatient OT Equipment Recommended: None recommended by OT Individuals Consulted Consulted and Agree with Results and Recommendations: Patient OT Goals Acute Rehab OT Goals OT Goal Formulation: With patient Time For Goal Achievement: 7 days ADL Goals Pt Will Perform Upper Body Bathing: with modified independence;Standing at sink ADL Goal: Upper Body Bathing - Progress: Not met Pt Will Perform Upper Body Dressing: with modified independence;Sitting, bed ADL Goal: Upper Body Dressing - Progress: Not met Pt Will Perform  Lower Body Dressing: with modified independence;Sit to stand from bed ADL Goal: Lower Body Dressing - Progress: Not met Additional ADL Goal #1: Pt will perform AROM/AAROM exercises with independence following handout to increase functional use of the LUE. ADL Goal: Additional Goal #1 - Progress: Not met  OT Evaluation Precautions/Restrictions  Precautions Precautions: Fall Required Braces or Orthoses: No Restrictions Weight Bearing Restrictions: No Prior Functioning Home Living Lives With:  (Grandfather) Type of Home: House Home Layout: One level Home Access: Stairs to enter Entrance Stairs-Rails: Right Entrance Stairs-Number of Steps: 3 Bathroom Shower/Tub: Forensic scientist: Standard Bathroom Accessibility: Yes How Accessible: Accessible via walker;Accessible via wheelchair Prior Function Level of Independence: Independent with basic ADLs Driving: Yes Vocation: Full time employment ADL ADL Eating/Feeding: Simulated;Modified independent (Using RUE primarily.) Where Assessed - Eating/Feeding: Edge of bed Grooming: Simulated;Modified independent;Other (comment) (Using RUE primarily.) Where Assessed - Grooming: Standing at sink Upper Body Bathing: Simulated;Minimal assistance Where Assessed - Upper Body Bathing: Standing at sink Lower Body Bathing: Simulated;Supervision/safety Where Assessed - Lower Body Bathing: Sit to stand from bed Upper Body Dressing: Simulated;Minimal assistance Where Assessed - Upper Body Dressing: Sitting, bed Lower Body Dressing: Performed;Minimal assistance (For fastening buttons.) Where Assessed - Lower Body Dressing: Sit to stand from bed Toilet Transfer: Performed;Independent Toilet Transfer Method: Proofreader: Regular height toilet Toileting - Clothing Manipulation: Simulated;Modified independent Where Assessed - Toileting Clothing Manipulation: Sit to stand from 3-in-1 or toilet Toileting -  Hygiene: Simulated;Modified independent Where Assessed - Toileting Hygiene: Sit to stand from 3-in-1 or toilet Tub/Shower Transfer: Performed;Modified independent Tub/Shower Transfer Method: Science writer: Transfer tub bench ADL Comments: Pt with limited LUE function to an active assist level with min assist.  Needs assist from the RUE to lift overhead.  Gross grasp and release present but unable to fully extend fingers completely or oppose thumb to 4th and 5th digits. Vision/Perception  Vision - History Baseline Vision: No visual  deficits Vision - Assessment Eye Alignment: Within Functional Limits Vision Assessment: Vision tested Perception Perception: Within Functional Limits Praxis Praxis: Intact Cognition Cognition Arousal/Alertness: Awake/alert Overall Cognitive Status: Appears within functional limits for tasks assessed Orientation Level: Oriented X4 Sensation/Coordination Sensation Light Touch: Appears Intact Stereognosis: Impaired by gross assessment Hot/Cold: Appears Intact Proprioception: Appears Intact Coordination Gross Motor Movements are Fluid and Coordinated: No Fine Motor Movements are Fluid and Coordinated: No Coordination and Movement Description: Pt with limited LUE use secondary to CVA.  Currently with Brunnstrum stage 5 movement in the right arm and hand. Extremity Assessment RUE Assessment RUE Assessment: Within Functional Limits LUE Assessment LUE Assessment: Exceptions to WFL LUE AROM (degrees) LUE Overall AROM Comments: Pt currently Brunnstrum stage V in the arm and hand.  Pt can use the UE as a gross assist for selfcare tasks independently but needs assist to incorporate as an active assist.  Demonstrates good gross grasp but decreased digit extension, especially in the 4th and 5th digits.   Mobility  Bed Mobility Bed Mobility: Yes Supine to Sit: 7: Independent Transfers Transfers: Yes Sit to Stand: 7:  Independent Exercises General Exercises - Upper Extremity Shoulder Flexion: AROM;Supine;10 reps;Left Elbow Flexion: AROM;Left;10 reps;Seated;Standing Wrist Extension: AROM;Left;10 reps;Seated Composite Extension: AROM;Left;10 reps Hand Exercises Opposition: AROM;Left;10 reps;Seated End of Session OT - End of Session Activity Tolerance: Patient tolerated treatment well Patient left: in bed;with family/visitor present;with call bell in reach General Behavior During Session: [facility] for tasks performed Cognition: [facility] for tasks performed   Jayquan Bradsher OTR/L 04/08/2011, 12:47 PM  Pager number [phone]

## 2011-04-08 NOTE — Progress Notes (Signed)
Speech Language/Pathology Speech Language Pathology Evaluation Patient Details Name: [name] MRN: [id number] DOB: 1965-10-04 Today's Date: 04/08/2011  Problem List:  Patient Active Problem List  Diagnoses  . CVA (cerebral infarction)  . Carotid artery occlusion  . Left hand weakness  . HTN (hypertension)   Past Medical History: History reviewed. No pertinent past medical history. Past Surgical History: History reviewed. No pertinent past surgical history.  SLP Assessment/Plan/Recommendation Assessment Clinical Impression Statement: Pt admitted with acute watershed infarcts in right hemisphere affecting the anterrior and middle cerebral artery territories. Pt presents with cognitive impairment characterized by decreased awareness, selective attention and functional problem solving. with impulsive behavior impacting safety and decision making. Pt also demonstrates delayed processing  impacting  auditory comprehension. Pt would benefit from skilled SLP services  to maximize cognitive function and  independence.   SLP Recommendation/Assessment: Patient will need skilled Speech Lanaguage Pathology Services in the acute care venue to address identified deficits Problem List: Auditory comprehension;Attention;Problem Solving;Reasoning Therapy Diagnosis: Cognitive Impairments Plan Speech Therapy Frequency: min 2x/week Duration: 1 week Treatment/Interventions: Cognitive reorganization;Patient/family education;SLP instruction and feedback;Cueing hierarchy;Environmental controls;Functional tasks Potential to Achieve Goals: Good SLP Recommendations Equipment Recommended: None recommended by SLP Individuals Consulted Consulted and Agree with Results and Recommendations: Patient  SLP Goals  SLP Goals Potential to Achieve Goals: Good SLP Goal #1: Pt will demonstrate recall/carryover of safety precautions with Mod A question cues SLP Goal #2: Pt will utilize call bell to request  wants/needs with Mod A verbal and questioning cues SLP Goal #3: Pt will identify 2 cognitive deficits with Mod A semantic and questioning cues  SLP Evaluation Prior Functioning Type of Home: House Lives With: Family Vocation: Full time employment  Cognition Arousal/Alertness: Awake/alert Orientation Level: Oriented X4 Attention: Selective Selective Attention: Impaired Selective Attention Impairment: Functional basic Memory: Appears intact Awareness: Impaired Awareness Impairment: Emergent impairment Problem Solving: Impaired Problem Solving Impairment: Verbal basic;Functional basic Behaviors: Impulsive Safety/Judgment: Impaired Comments: Pt getting up out of bed without assistance and need Max verbal and tactile cues to wait for assistance to remove   Comprehension Auditory Comprehension Yes/No Questions: Impaired Complex Questions: 75-100% accurate (80 %) Paragraph Comprehension (via yes/no questions): 26-50% accurate (50%) Commands: Within Functional Limits Conversation: Simple Interfering Components: Attention EffectiveTechniques: Extra processing time Visual Recognition/Discrimination Discrimination: Within Function Limits Reading Comprehension Reading Status: Not tested  Expression Expression Primary Mode of Expression: Verbal Verbal Expression Overall Verbal Expression: Appears within functional limits for tasks assessed  Oral/Motor Oral Motor/Sensory Function Overall Oral Motor/Sensory Function: Appears within functional limits for tasks assessed  [name] 04/08/2011, 3:30 PM

## 2011-04-08 NOTE — Progress Notes (Signed)
Pt smokes 1/2 ppd and is in contemplation stage. Discussed risk factors of smoking and its adverse effects. Recommended 14 mg patch x 2 weeks and 7 mg patch x 2 weeks. Discussed patch use instructions. Referred to 1-800 quit now for f/u and support. Discussed oral fixation substitutes, second hand smoke and in home smoking policy. Reviewed and gave pt Written education/contact information.

## 2011-04-09 DIAGNOSIS — I6789 Other cerebrovascular disease: Secondary | ICD-10-CM

## 2011-04-09 LAB — COMPREHENSIVE METABOLIC PANEL
AST: 19 U/L (ref 0–37)
Albumin: 3.2 g/dL — ABNORMAL LOW (ref 3.5–5.2)
Calcium: 9.4 mg/dL (ref 8.4–10.5)
Creatinine, Ser: 0.7 mg/dL (ref 0.50–1.10)
Sodium: 137 mEq/L (ref 135–145)
Total Protein: 7.8 g/dL (ref 6.0–8.3)

## 2011-04-09 MED ORDER — NICOTINE 21 MG/24HR TD PT24
21.0000 mg | MEDICATED_PATCH | Freq: Every day | TRANSDERMAL | Status: DC
  Administered 2011-04-09 – 2011-04-10 (×2): 21 mg via TRANSDERMAL
  Filled 2011-04-09 (×2): qty 1

## 2011-04-09 MED ORDER — ZOLPIDEM TARTRATE 5 MG PO TABS
5.0000 mg | ORAL_TABLET | Freq: Once | ORAL | Status: AC
  Administered 2011-04-09: 5 mg via ORAL
  Filled 2011-04-09: qty 1

## 2011-04-09 MED ORDER — ENOXAPARIN SODIUM 40 MG/0.4ML ~~LOC~~ SOLN
40.0000 mg | SUBCUTANEOUS | Status: DC
  Administered 2011-04-09: 40 mg via SUBCUTANEOUS
  Filled 2011-04-09 (×2): qty 0.4

## 2011-04-09 NOTE — Progress Notes (Signed)
*  PRELIMINARY RESULTS* Echocardiogram 2D Echocardiogram has been performed.  Clide Deutscher RDCS 04/09/2011, 9:38 AM

## 2011-04-09 NOTE — Progress Notes (Signed)
Occupational Therapy Treatment Patient Details Name: [name] MRN: [id number] DOB: 1966-03-29 Today's Date: 04/09/2011  OT Assessment/Plan OT Assessment/Plan - Increased strength proximately. Brunstrom stage IV arm. III - IV hand. Will benefit from neuro outpt. Comments on Treatment Session: pt participated well. Appeared restless. Wanting to go smoke. OT Plan: Discharge plan remains appropriate OT Frequency: Min 2X/week Follow Up Recommendations: Outpatient OT OT Goals ADL Goals ADL Goal: Upper Body Bathing - Progress: Progressing toward goals ADL Goal: Upper Body Dressing - Progress: Progressing toward goals ADL Goal: Lower Body Dressing - Progress: Progressing toward goals ADL Goal: Additional Goal #1 - Progress: Progressing toward goals  OT Treatment Precautions/Restrictions  Precautions Precautions: Fall Required Braces or Orthoses: No Restrictions Weight Bearing Restrictions: No   ADL  overall supervision to min A Mobility  Transfers Transfers: Yes Sit to Stand: 5: Supervision Stand to Sit: 5: Supervision Exercises General Exercises - Upper Extremity Shoulder Flexion: AROM;AAROM;Self ROM;Strengthening;Left Shoulder ABduction: AROM;AAROM;Strengthening;Left Elbow Flexion: Self ROM;Strengthening;Left Elbow Extension: AROM;Self ROM;Strengthening;Left Wrist Flexion: AROM;Strengthening;Left Wrist Extension: AROM;AAROM;Strengthening;Left Digit Composite Flexion: AROM;Strengthening;Left Hand Exercises Digit Lifts: AROM;AAROM;Left Thumb Abduction: AROM;Self ROM;Strengthening Thumb Adduction: AROM;Self ROM;Strengthening;Left  End of Session OT - End of Session Equipment Utilized During Treatment: Gait belt Activity Tolerance: Patient tolerated treatment well Patient left: in chair;with call bell in reach;with family/visitor present General Behavior During Session: Restless Cognition: Impaired (appears impulsive. poor judgement. ? premorbid?)  [name],[name]    04/09/2011, 4:13 PM

## 2011-04-09 NOTE — Progress Notes (Signed)
PATIENT DETAILS Name: [name] Age: 46 y.o. Sex: female Date of Birth: 1965-07-14 Admit Date: 04/07/2011 PCP:No primary provider on file.  Subjective: Wants to smoke!!!  Objective: Vital signs in last 24 hours: Filed Vitals:   04/09/11 0315 04/09/11 0500 04/09/11 1005 04/09/11 1415  BP: 122/78 116/78 133/86 121/77  Pulse: 68 72 75 77  Temp: 97.9 F (36.6 C) 98.2 F (36.8 C) 97.9 F (36.6 C) 98.7 F (37.1 C)  TempSrc: Oral Oral Oral Oral  Resp: 18 18 16 16   Height:      Weight:      SpO2: 97% 95% 92% 96%    Weight change:   Body mass index is 41.97 kg/(m^2).  Intake/Output from previous day: No intake or output data in the 24 hours ending 04/09/11 1517  PHYSICAL EXAM: Gen Exam: Awake and alert with clear speech.   Neck: Supple, No JVD.   Chest: B/L Clear.   CVS: S1 S2 Regular, no murmurs.  Abdomen: soft, BS +, non tender, non distended.  Extremities: no edema, lower extremities warm to touch. Neurologic: Mild left upper extremity weakness. Skin: No Rash.   Wounds: N/A.    CONSULTS:  neurology  LAB RESULTS: CBC  Lab 04/07/11 1712  WBC 7.9  HGB 12.4  HCT 39.0  PLT 367  MCV 87.4  MCH 27.8  MCHC 31.8  RDW 14.5  LYMPHSABS 2.9  MONOABS 0.4  EOSABS 0.0  BASOSABS 0.0  BANDABS --    Chemistries   Lab 04/09/11 0640 04/07/11 1712  NA 137 137  K 3.4* 3.5  CL 103 105  CO2 24 27  GLUCOSE 91 94  BUN 6 5*  CREATININE 0.70 0.67  CALCIUM 9.4 10.1  MG -- --    GFR Estimated Creatinine Clearance: 117.2 ml/min (by C-G formula based on Cr of 0.7).  Coagulation profile No results found for this basename: INR:5,PROTIME:5 in the last 168 hours  Cardiac Enzymes No results found for this basename: CK:3,CKMB:3,TROPONINI:3,MYOGLOBIN:3 in the last 168 hours  No components found with this basename: POCBNP:3 No results found for this basename: DDIMER:2 in the last 72 hours  Basename 04/08/11 0615  HGBA1C 5.2    Basename 04/08/11 0615  CHOL 213*   HDL 37*  LDLCALC 153*  TRIG 114  CHOLHDL 5.8  LDLDIRECT --   No results found for this basename: TSH,T4TOTAL,FREET3,T3FREE,THYROIDAB in the last 72 hours No results found for this basename: VITAMINB12:2,FOLATE:2,FERRITIN:2,TIBC:2,IRON:2,RETICCTPCT:2 in the last 72 hours No results found for this basename: LIPASE:2,AMYLASE:2 in the last 72 hours  Urine Studies No results found for this basename: UACOL:2,UAPR:2,USPG:2,UPH:2,UTP:2,UGL:2,UKET:2,UBIL:2,UHGB:2,UNIT:2,UROB:2,ULEU:2,UEPI:2,UWBC:2,URBC:2,UBAC:2,CAST:2,CRYS:2,UCOM:2,BILUA:2 in the last 72 hours  MICROBIOLOGY: No results found for this or any previous visit (from the past 240 hour(s)).  RADIOLOGY STUDIES/RESULTS: Ct Angio Head W/cm &/or Wo Cm  04/08/2011  *RADIOLOGY REPORT*  Clinical Data:  The right carotid occlusion.  Left arm weakness.  CT ANGIOGRAPHY HEAD AND NECK  Technique:  Multidetector CT imaging of the head and neck was performed using the standard protocol during bolus administration of intravenous contrast.  Multiplanar CT image reconstructions including MIPs were obtained to evaluate the vascular anatomy. Carotid stenosis measurements (when applicable) are obtained utilizing NASCET criteria, using the distal internal carotid diameter as the denominator.  Contrast: 80mL OMNIPAQUE IOHEXOL 350 MG/ML IV SOLN  Comparison:  MRI brain 04/06/2010.  CTA NECK  Findings: The left vertebral artery originates from the aortic arch, a normal variant.  The right vertebral artery originates from the subclavian.  There is  some streak artifact across the arch area is secondary patient body habitus.  No focal stenosis is evident.  The vertebral arteries are within normal limits throughout the remainder of the neck.  The right vertebral artery is slightly dominant to the left.  No focal stenosis is evident.  The right common carotid artery is within normal limits.  The right internal carotid artery is occluded 8 mm from the bifurcation. There is no  proximal reconstitution.  The external carotid artery is within normal limits.  The left common carotid artery is within normal limits.  The bifurcation is unremarkable.  The left internal carotid artery is normal throughout the neck.  Mild degenerative changes are present at C5-6 without significant stenosis.   Review of the MIP images confirms the above findings.  IMPRESSION:  1.  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 2.  The left vertebral artery originates from the aortic arch, a normal variant. 3.  Normal appearance of the left carotid artery.  CTA HEAD  Findings:  The right caudate head and watershed territory infarcts are redemonstrated.  No hemorrhage or mass lesion is present.  The right internal carotid artery is occluded.  It is reconstituted at the level of the ophthalmic artery.  The A1 and M1 segments fill normally on both sides.  The MCA bifurcations are within normal limits.  There is significant attenuation of MCA branch vessels bilaterally, more prominently on the right.  The right vertebral artery is the dominant vessel.  The basilar artery is within normal limits.  The PICA origins are not discretely visualized.  Both posterior cerebral arteries originate from basilar tip.  No left posterior communicating artery is present.  No definite right posterior communicating artery is seen. The there is some attenuation of distal PCA branch vessels bilaterally.   Review of the MIP images confirms the above findings.  IMPRESSION:  1.  Occluded right internal carotid artery is reconstituted at the level of the ophthalmic artery. 2.  Moderate small vessel disease. 3.  Significant asymmetric attenuation of right MCA branches versus the left, compatible with diminished perfusion.  Original Report Authenticated By: Jamesetta Orleans. MATTERN, M.D.   Dg Chest 2 View  04/08/2011  *RADIOLOGY REPORT*  Clinical Data: Headache.  Stroke.  CHEST - 2 VIEW  Comparison: 04/08/2011   Findings: Heart size is normal.  No pleural effusion or pulmonary edema.  There is no airspace consolidation identified.  Review of the visualized osseous structures is unremarkable.  IMPRESSION:  1.  No active cardiopulmonary abnormalities.  Original Report Authenticated By: Rosealee Albee, M.D.   Ct Angio Neck W/cm &/or Wo/cm  04/08/2011  *RADIOLOGY REPORT*  Clinical Data:  The right carotid occlusion.  Left arm weakness.  CT ANGIOGRAPHY HEAD AND NECK  Technique:  Multidetector CT imaging of the head and neck was performed using the standard protocol during bolus administration of intravenous contrast.  Multiplanar CT image reconstructions including MIPs were obtained to evaluate the vascular anatomy. Carotid stenosis measurements (when applicable) are obtained utilizing NASCET criteria, using the distal internal carotid diameter as the denominator.  Contrast: 80mL OMNIPAQUE IOHEXOL 350 MG/ML IV SOLN  Comparison:  MRI brain 04/06/2010.  CTA NECK  Findings: The left vertebral artery originates from the aortic arch, a normal variant.  The right vertebral artery originates from the subclavian.  There is some streak artifact across the arch area is secondary patient body habitus.  No focal stenosis is evident.  The vertebral arteries are  within normal limits throughout the remainder of the neck.  The right vertebral artery is slightly dominant to the left.  No focal stenosis is evident.  The right common carotid artery is within normal limits.  The right internal carotid artery is occluded 8 mm from the bifurcation. There is no proximal reconstitution.  The external carotid artery is within normal limits.  The left common carotid artery is within normal limits.  The bifurcation is unremarkable.  The left internal carotid artery is normal throughout the neck.  Mild degenerative changes are present at C5-6 without significant stenosis.   Review of the MIP images confirms the above findings.  IMPRESSION:  1.   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 2.  The left vertebral artery originates from the aortic arch, a normal variant. 3.  Normal appearance of the left carotid artery.  CTA HEAD  Findings:  The right caudate head and watershed territory infarcts are redemonstrated.  No hemorrhage or mass lesion is present.  The right internal carotid artery is occluded.  It is reconstituted at the level of the ophthalmic artery.  The A1 and M1 segments fill normally on both sides.  The MCA bifurcations are within normal limits.  There is significant attenuation of MCA branch vessels bilaterally, more prominently on the right.  The right vertebral artery is the dominant vessel.  The basilar artery is within normal limits.  The PICA origins are not discretely visualized.  Both posterior cerebral arteries originate from basilar tip.  No left posterior communicating artery is present.  No definite right posterior communicating artery is seen. The there is some attenuation of distal PCA branch vessels bilaterally.   Review of the MIP images confirms the above findings.  IMPRESSION:  1.  Occluded right internal carotid artery is reconstituted at the level of the ophthalmic artery. 2.  Moderate small vessel disease. 3.  Significant asymmetric attenuation of right MCA branches versus the left, compatible with diminished perfusion.  Original Report Authenticated By: Jamesetta Orleans. MATTERN, M.D.   Mr Brain Wo Contrast  04/07/2011  *RADIOLOGY REPORT*  Clinical Data: Weakness of the left arm and leg for 2 days.  MRI HEAD WITHOUT CONTRAST  Technique:  Multiplanar, multiecho pulse sequences of the brain and surrounding structures were obtained according to standard protocol without intravenous contrast.  Comparison: None.  Findings: There is right common carotid occlusion with an absence of flow in that vessel.  There are areas of acute infarction affecting the right basal ganglia, anterior limb internal  capsule, scattered foci within the right middle cerebral artery territory and watershed infarctions along the margins of the anterior and middle cerebral arteries on the right.  There is no abnormality of the brainstem or cerebellum.  There is no abnormality of the left cerebral hemisphere.  No evidence of hemorrhage.  No significant swelling or mass effect/shift.  No mass lesion, hydrocephalus or extra-axial collection.  No pituitary mass.  Sinuses, middle ears and mastoids are clear.  IMPRESSION: Right carotid artery occlusion. Acute watershed zone infarctions throughout the right hemisphere along the margins of the anterior and middle cerebral artery territories.  Scattered small cortical infarctions in the right middle cerebral artery territory.  Acute infarction in the right basal ganglia and anterior limb internal capsule.  Original Report Authenticated By: Thomasenia Sales, M.D.    MEDICATIONS: Scheduled Meds:   . aspirin  81 mg Oral Daily  . clopidogrel  75 mg Oral Q breakfast  . nicotine  21 mg Transdermal Daily  . simvastatin  40 mg Oral q1800  . DISCONTD: nicotine  7 mg Transdermal Daily  . DISCONTD: sodium chloride  3 mL Intravenous Q12H   Continuous Infusions:  PRN Meds:.acetaminophen, acetaminophen, senna-docusate, DISCONTD: sodium chloride, DISCONTD: sodium chloride  Antibiotics: Anti-infectives    None     Assessment/Plan: Patient Active Hospital Problem List: CVA (cerebral infarction)    Assessment: Stable following a subacute CVA. Mostly has weakness and her left upper extremity    Plan: Continue with aspirin and Plavix. Await 2-D echocardiogram. Will continue with statin as well.   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    Plan: Continue with antiplatelet agents and statin.    Dyslipidemia    Assessment: DL  308    Plan: Continue with statin   Disposition: Home  DVT Prophylaxis: Lovenox  Code Status: Full code  Maretta Bees, MD. 04/09/2011, 3:17 PM

## 2011-04-09 NOTE — Progress Notes (Signed)
Stroke Team Progress Note   SUBJECTIVE  [name] is an 46 y.o. female. Who developed sudden onset of left hand weakness 3 days ago upon arising from sleep. She chose not to seek medical advice until 2 days later when she came to [facility] yesterday. 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 She has remained stable since arrival here but continues to have left distal hand weakness. She denies any headache, blurred vision, vertigo, diplopia date of balance problems. She has no prior history of strokes TIAs migraines or any significant neurological problems.  Stroke risk factors include smoking, obesity and probably hypertension. She admits to smoking marijuana often on but denies other drug abuse.  TPA considered : No as presented late  OBJECTIVE Most recent Vital Signs: Temp: 98.7 F (37.1 C) (01/05 1415) Temp src: Oral (01/05 1415) BP: 121/77 mmHg (01/05 1415) Pulse Rate: 77  (01/05 1415) Respiratory Rate: 16 O2 Saturdation: 96%  CBG (last 3)  No results found for this basename: GLUCAP:3 in the last 72 hours       . aspirin  81 mg Oral Daily  . clopidogrel  75 mg Oral Q breakfast  . nicotine  21 mg Transdermal Daily  . simvastatin  40 mg Oral q1800  . DISCONTD: nicotine  7 mg Transdermal Daily  . DISCONTD: sodium chloride  3 mL Intravenous Q12H  CBC    Component Value Date/Time   WBC 7.9 04/07/2011 1712   RBC 4.46 04/07/2011 1712   HGB 12.4 04/07/2011 1712   HCT 39.0 04/07/2011 1712   PLT 367 04/07/2011 1712   MCV 87.4 04/07/2011 1712   MCH 27.8 04/07/2011 1712   MCHC 31.8 04/07/2011 1712   RDW 14.5 04/07/2011 1712   LYMPHSABS 2.9 04/07/2011 1712   MONOABS 0.4 04/07/2011 1712   EOSABS 0.0 04/07/2011 1712   BASOSABS 0.0 04/07/2011 1712   CMP    Component Value Date/Time   NA 137 04/09/2011 0640   K 3.4* 04/09/2011 0640   CL 103 04/09/2011 0640   CO2 24 04/09/2011 0640   GLUCOSE 91 04/09/2011 0640   BUN 6 04/09/2011 0640   CREATININE 0.70 04/09/2011 0640   CALCIUM 9.4 04/09/2011 0640   PROT 7.8 04/09/2011 0640   ALBUMIN 3.2* 04/09/2011 0640   AST 19 04/09/2011 0640   ALT 17 04/09/2011 0640   ALKPHOS 90 04/09/2011 0640   BILITOT 0.3 04/09/2011 0640   GFRNONAA >90 04/09/2011 0640   GFRAA >90 04/09/2011 0640   COAGS No results found for this basename: INR, PROTIME   Lipid Panel    Component Value Date/Time   CHOL 213* 04/08/2011 0615   TRIG 114 04/08/2011 0615   HDL 37* 04/08/2011 0615   CHOLHDL 5.8 04/08/2011 0615   VLDL 23 04/08/2011 0615   LDLCALC 153* 04/08/2011 0615   HgbA1C  Lab Results  Component Value Date   HGBA1C 5.2 04/08/2011   Urine Drug Screen  No results found for this basename: labopia, cocainscrnur, labbenz, amphetmu, thcu, labbarb    Alcohol Level No results found for this basename: eth     Ct Angio Head W/cm &/or Wo Cm  04/08/2011  *RADIOLOGY REPORT*  Clinical Data:  The right carotid occlusion.  Left arm weakness.  CT ANGIOGRAPHY HEAD AND NECK  Technique:  Multidetector CT imaging of the head and neck was performed using the standard protocol during bolus administration of intravenous contrast.  Multiplanar CT image reconstructions including MIPs were obtained to evaluate the vascular anatomy. Carotid stenosis measurements (when applicable) are obtained utilizing NASCET criteria, using the distal internal carotid diameter as the denominator.  Contrast: 80mL OMNIPAQUE IOHEXOL 350 MG/ML IV SOLN  Comparison:  MRI brain 04/06/2010.  CTA NECK  Findings: The left vertebral artery originates from the aortic arch, a normal variant.  The right vertebral artery originates from the subclavian.  There is some streak artifact across the arch area is secondary patient body habitus.  No focal stenosis is evident.  The  vertebral arteries are within normal limits throughout the remainder of the neck.  The right vertebral artery is slightly dominant to the left.  No focal stenosis is evident.  The right common carotid artery is within normal limits.  The right internal carotid artery is occluded 8 mm from the bifurcation. There is no proximal reconstitution.  The external carotid artery is within normal limits.  The left common carotid artery is within normal limits.  The bifurcation is unremarkable.  The left internal carotid artery is normal throughout the neck.  Mild degenerative changes are present at C5-6 without significant stenosis.   Review of the MIP images confirms the above findings.  IMPRESSION:  1.  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 2.  The left vertebral artery originates from the aortic arch, a normal variant. 3.  Normal appearance of the left carotid artery.  CTA HEAD  Findings:  The right caudate head and watershed territory infarcts are redemonstrated.  No hemorrhage or mass lesion is present.  The right internal carotid artery is occluded.  It is reconstituted at the level of the ophthalmic artery.  The A1 and M1 segments fill normally on both sides.  The MCA bifurcations are within normal limits.  There is significant attenuation of MCA branch vessels bilaterally, more prominently on the right.  The right vertebral artery is the dominant vessel.  The basilar artery is within normal limits.  The PICA origins are not discretely visualized.  Both posterior cerebral arteries originate from basilar tip.  No left posterior communicating artery is present.  No definite right posterior communicating artery is seen. The there is some attenuation of distal PCA branch vessels bilaterally.   Review of the MIP images confirms the above findings.  IMPRESSION:  1.  Occluded right internal carotid artery is reconstituted at the level of the ophthalmic artery. 2.  Moderate small vessel  disease. 3.  Significant asymmetric attenuation of right MCA branches versus the left, compatible with diminished perfusion.  Original Report Authenticated By: Jamesetta Orleans. MATTERN, M.D.   Dg Chest 2 View  04/08/2011  *RADIOLOGY REPORT*  Clinical Data: Headache.  Stroke.  CHEST - 2 VIEW  Comparison: 04/08/2011  Findings: Heart size is normal.  No pleural effusion or pulmonary edema.  There is no airspace consolidation identified.  Review of the visualized osseous structures is unremarkable.  IMPRESSION:  1.  No active cardiopulmonary abnormalities.  Original Report Authenticated By: Rosealee Albee, M.D.   Ct Angio Neck W/cm &/or Wo/cm  04/08/2011  *RADIOLOGY REPORT*  Clinical Data:  The right carotid occlusion.  Left arm weakness.  CT ANGIOGRAPHY HEAD AND NECK  Technique:  Multidetector CT imaging of the head and neck was performed using the standard protocol during  bolus administration of intravenous contrast.  Multiplanar CT image reconstructions including MIPs were obtained to evaluate the vascular anatomy. Carotid stenosis measurements (when applicable) are obtained utilizing NASCET criteria, using the distal internal carotid diameter as the denominator.  Contrast: 80mL OMNIPAQUE IOHEXOL 350 MG/ML IV SOLN  Comparison:  MRI brain 04/06/2010.  CTA NECK  Findings: The left vertebral artery originates from the aortic arch, a normal variant.  The right vertebral artery originates from the subclavian.  There is some streak artifact across the arch area is secondary patient body habitus.  No focal stenosis is evident.  The vertebral arteries are within normal limits throughout the remainder of the neck.  The right vertebral artery is slightly dominant to the left.  No focal stenosis is evident.  The right common carotid artery is within normal limits.  The right internal carotid artery is occluded 8 mm from the bifurcation. There is no proximal reconstitution.  The external carotid artery is within normal  limits.  The left common carotid artery is within normal limits.  The bifurcation is unremarkable.  The left internal carotid artery is normal throughout the neck.  Mild degenerative changes are present at C5-6 without significant stenosis.   Review of the MIP images confirms the above findings.   IMPRESSION:  1.  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 2.  The left vertebral artery originates from the aortic arch, a normal variant. 3.  Normal appearance of the left carotid artery.  CTA HEAD  Findings:  The right caudate head and watershed territory infarcts are redemonstrated.  No hemorrhage or mass lesion is present.  The right internal carotid artery is occluded.  It is reconstituted at the level of the ophthalmic artery.  The A1 and M1 segments fill normally on both sides.  The MCA bifurcations are within normal limits.  There is significant attenuation of MCA branch vessels bilaterally, more prominently on the right.  The right vertebral artery is the dominant vessel.  The basilar artery is within normal limits.  The PICA origins are not discretely visualized.  Both posterior cerebral arteries originate from basilar tip.  No left posterior communicating artery is present.  No definite right posterior communicating artery is seen. The there is some attenuation of distal PCA branch vessels bilaterally.   Review of the MIP images confirms the above findings.  IMPRESSION:  1.  Occluded right internal carotid artery is reconstituted at the level of the ophthalmic artery. 2.  Moderate small vessel disease. 3.  Significant asymmetric attenuation of right MCA branches versus the left, compatible with diminished perfusion.  Original Report Authenticated By: Jamesetta Orleans. MATTERN, M.D.   Mr Brain Wo Contrast  04/07/2011  *RADIOLOGY REPORT*  Clinical Data: Weakness of the left arm and leg for 2 days.  MRI HEAD WITHOUT CONTRAST  Technique:  Multiplanar, multiecho pulse sequences of the  brain and surrounding structures were obtained according to standard protocol without intravenous contrast.  Comparison: None.  Findings: There is right common carotid occlusion with an absence of flow in that vessel.  There are areas of acute infarction affecting the right basal ganglia, anterior limb internal capsule, scattered foci within the right middle cerebral artery territory and watershed infarctions along the margins of the anterior and middle cerebral arteries on the right.  There is no abnormality of the brainstem or cerebellum.  There is no abnormality of the left cerebral hemisphere.  No evidence of hemorrhage.  No significant swelling or mass effect/shift.  No mass lesion, hydrocephalus or extra-axial collection.  No pituitary mass.  Sinuses, middle ears and mastoids are clear.  IMPRESSION: Right carotid artery occlusion. Acute watershed zone infarctions throughout the right hemisphere along the margins of the anterior and middle cerebral artery territories.  Scattered small cortical infarctions in the right middle cerebral artery territory.  Acute infarction in the right basal ganglia and anterior limb internal capsule.  Original Report Authenticated By: Thomasenia Sales, M.D.     EKG  NSR   Physical Exam Neurologic Exam:  Mental Status:  Alert, oriented, thought content appropriate. Speech fluent without evidence of aphasia. Able to follow 3 step commands without difficulty.  Cranial Nerves:  II- Visual fields grossly intact.  III/IV/VI-Extraocular movements intact. Pupils reactive bilaterally.  V/VII-Smile symmetric  VIII-hearing grossly intact  IX/X-normal gag  XI-bilateral shoulder shrug  XII-midline tongue extension  Motor: Has proximal 4+/5 strength L upper extremity with , weak grip L hand, and significant pronator drift on the L. LLE strength 5/5. R UE/LE 5/5. Normal muscle bulk.  Sensory: Light touch intact throughout, bilaterally  Deep Tendon Reflexes: 2+ and symmetric  throughout  Plantars: Downgoing bilaterally  Cerebellar: deferred.      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 Etiology not clear atherosclerotic versus dissection even though medical history is not suggestive of any neck injury. Patient has presented beyond window for intervention. Remains neurologically stable, but with persistent left UE and L grip weakness.  Plan:  1. Aspirin 81 mg a day plus Plavix 75 mg for the next 3 months followed by Plavix alone.  2. Smoking cessation consult recommended. Would not recommend nicotine replacement in recent stroke case.  3. LDL 153, has been started on simvastatin; Goal LDL < 100.  4. 2-D echocardiogram ordered, results pending.  5. PT, OT, ST.      Melvyn Novas, MD [facility] 04/09/2011 2:38 PMSubjective: Pt doing well.  Has weakness of LUE, uses R hand to lift up left.  Says Left fatigues easily.  Lives near the hospital in [place] and is willing to attend out patient rehab there.  Objective: BP 121/77  Pulse 77  Temp(Src) 98.7 F (37.1 C) (Oral)  Resp 16  Ht 5\' 6"  (1.676 m)  Wt 117.935 kg (260 lb)  BMI 41.97 kg/m2  SpO2 96%  LMP 03/27/2011   Medications:    . aspirin  81 mg Oral Daily  . clopidogrel  75 mg Oral Q breakfast  . nicotine  21 mg Transdermal Daily  . simvastatin  40 mg Oral q1800   Neurologic Exam: Mental Status: Alert, oriented, thought content appropriate.  Speech fluent without evidence of aphasia. Able to follow 3 step commands without difficulty. Cranial Nerves: II- Visual fields grossly intact. III/IV/VI-Extraocular movements intact.  Pupils reactive bilaterally. V/VII-Smile symmetric VIII-hearing grossly intact IX/X-normal gag XI-bilateral shoulder shrug XII-midline tongue extension Motor: Has proximal 4+/5 strength L upper extremity with , weak grip L hand, and significant  pronator drift on the L.  LLE strength 5/5. R UE/LE 5/5.  Normal muscle bulk. Sensory: Light touch intact throughout, bilaterally Deep Tendon Reflexes: 2+ and symmetric throughout Plantars: Downgoing bilaterally Cerebellar: deferred.  Lab Results: HEMOGLOBIN A1C     Status: Normal   04/08/11  6:15 AM   Hemoglobin A1C 5.2  <5.7 (%)    Mean Plasma Glucose 103  <117 (mg/dL)   LIPID PANEL     Status: Abnormal  04/08/11  6:15 AM   Cholesterol 213 (*) 0 - 200 (mg/dL)    Triglycerides 846  <150 (mg/dL)    HDL 37 (*) >96 (mg/dL)    Total CHOL/HDL Ratio 5.8      VLDL 23  0 - 40 (mg/dL)    LDL Cholesterol 295 (*) 0 - 99 (mg/dL)   COMPREHENSIVE METABOLIC PANEL     Status: Abnormal   04/09/11  6:40 AM   Sodium 137  135 - 145 (mEq/L)    Potassium 3.4 (*) 3.5 - 5.1 (mEq/L)    Chloride 103  96 - 112 (mEq/L)    CO2 24  19 - 32 (mEq/L)    Glucose, Bld 91  70 - 99 (mg/dL)    BUN 6  6 - 23 (mg/dL)    Creatinine, Ser 2.84  0.50 - 1.10 (mg/dL)    Calcium 9.4  8.4 - 10.5 (mg/dL)    Total Protein 7.8  6.0 - 8.3 (g/dL)    Albumin 3.2 (*) 3.5 - 5.2 (g/dL)    AST 19  0 - 37 (U/L)    ALT 17  0 - 35 (U/L)    Alkaline Phosphatase 90  39 - 117 (U/L)    Total Bilirubin 0.3  0.3 - 1.2 (mg/dL)    GFR calc non Af Amer >90  >90 (mL/min)    GFR calc Af Amer >90  >90 (mL/min)     Studies/Results:  04/08/2011   CT ANGIOGRAPHY  NECK Findings: The left vertebral artery originates from the aortic arch, a normal variant.  The right vertebral artery originates from the subclavian.  There is some streak artifact across the arch area is secondary patient body habitus.  No focal stenosis is evident.  The vertebral arteries are within normal limits throughout the remainder of the neck.  The right vertebral artery is slightly dominant to the left.  No focal stenosis is evident.  The right common carotid artery is within normal limits.  The right internal carotid artery is occluded 8 mm from the bifurcation. There is no proximal  reconstitution.  The external carotid artery is within normal limits.  The left common carotid artery is within normal limits.  The bifurcation is unremarkable.  The left internal carotid artery is normal throughout the neck.  Mild degenerative changes are present at C5-6 without significant stenosis.   Review of the MIP images confirms the above findings.  IMPRESSION:  1.  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 2.  The left vertebral artery originates from the aortic arch, a normal variant. 3.  Normal appearance of the left carotid artery.    04/08/2011 CTA HEAD  Findings:  The right caudate head and watershed territory infarcts are redemonstrated.  No hemorrhage or mass lesion is present.  The right internal carotid artery is occluded.  It is reconstituted at the level of the ophthalmic artery.  The A1 and M1 segments fill normally on both sides.  The MCA bifurcations are within normal limits.  There is significant attenuation of MCA branch vessels bilaterally, more prominently on the right.  The right vertebral artery is the dominant vessel.  The basilar artery is within normal limits.  The PICA origins are not discretely visualized.  Both posterior cerebral arteries originate from basilar tip.  No left posterior communicating artery is present.  No definite right posterior communicating artery is seen. The there is some attenuation of distal PCA branch vessels bilaterally.   Review of  the MIP images confirms the above findings.  IMPRESSION:  1.  Occluded right internal carotid artery is reconstituted at the level of the ophthalmic artery. 2.  Moderate small vessel disease. 3.  Significant asymmetric attenuation of right MCA branches versus the left, compatible with diminished perfusion.   CHRISTOPHER W. MATTERN, M.D.   04/07/2011 MRI HEAD WITHOUT CONTRAST    Findings: There is right common carotid occlusion with an absence of flow in that vessel.  There are areas of acute  infarction affecting the right basal ganglia, anterior limb internal capsule, scattered foci within the right middle cerebral artery territory and watershed infarctions along the margins of the anterior and middle cerebral arteries on the right.  There is no abnormality of the brainstem or cerebellum.  There is no abnormality of the left cerebral hemisphere.  No evidence of hemorrhage.  No significant swelling or mass effect/shift.  No mass lesion, hydrocephalus or extra-axial collection.  No pituitary mass.  Sinuses, middle ears and mastoids are clear.  IMPRESSION: Right carotid artery occlusion. Acute watershed zone infarctions throughout the right hemisphere along the margins of the anterior and middle cerebral artery territories.  Scattered small cortical infarctions in the right middle cerebral artery territory.  Acute infarction in the right basal ganglia and anterior limb internal capsule.Thomasenia Sales, M.D.   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 Etiology not clear atherosclerotic versus dissection even though medical history is not suggestive of any neck injury.  Patient has presented beyond window for intervention.  Remains neurologically stable, but with persistent left UE and L grip weakness.   Plan:  1. Aspirin 81 mg a day plus Plavix 75 mg for the next 3 months followed by Plavix alone.  2. Smoking cessation consult recommended.  Would not recommend nicotine replacement in recent stroke case. 3. LDL 153, has been started on simvastatin;  Goal LDL < 100. 4. 2-D echocardiogram ordered, results pending. 5. PT, OT, ST.   LOS: 2 days    04/09/2011  2:38 PM

## 2011-04-09 NOTE — Progress Notes (Signed)
Subjective: Pt doing well.  Has weakness of LUE, uses R hand to lift up left.  Says Left fatigues easily.  Lives near the hospital in [place] and is willing to attend out patient rehab there.  Objective: BP 133/86  Pulse 75  Temp(Src) 97.9 F (36.6 C) (Oral)  Resp 16  Ht 5\' 6"  (1.676 m)  Wt 117.935 kg (260 lb)  BMI 41.97 kg/m2  SpO2 92%  LMP 03/27/2011   Medications:    . aspirin  81 mg Oral Daily  . clopidogrel  75 mg Oral Q breakfast  . nicotine  21 mg Transdermal Daily  . simvastatin  40 mg Oral q1800   Neurologic Exam: Mental Status: Alert, oriented, thought content appropriate.  Speech fluent without evidence of aphasia. Able to follow 3 step commands without difficulty. Cranial Nerves: II- Visual fields grossly intact. III/IV/VI-Extraocular movements intact.  Pupils reactive bilaterally. V/VII-Smile symmetric VIII-hearing grossly intact IX/X-normal gag XI-bilateral shoulder shrug XII-midline tongue extension Motor: Has proximal 4+/5 strength L upper extremity with , weak grip L hand, and significant pronator drift on the L.  LLE strength 5/5. R UE/LE 5/5.  Normal muscle bulk. Sensory: Light touch intact throughout, bilaterally Deep Tendon Reflexes: 2+ and symmetric throughout Plantars: Downgoing bilaterally Cerebellar: deferred.  Lab Results: HEMOGLOBIN A1C     Status: Normal   04/08/11  6:15 AM   Hemoglobin A1C 5.2  <5.7 (%)    Mean Plasma Glucose 103  <117 (mg/dL)   LIPID PANEL     Status: Abnormal   04/08/11  6:15 AM   Cholesterol 213 (*) 0 - 200 (mg/dL)    Triglycerides 782  <150 (mg/dL)    HDL 37 (*) >95 (mg/dL)    Total CHOL/HDL Ratio 5.8      VLDL 23  0 - 40 (mg/dL)    LDL Cholesterol 621 (*) 0 - 99 (mg/dL)   COMPREHENSIVE METABOLIC PANEL     Status: Abnormal   04/09/11  6:40 AM   Sodium 137  135 - 145 (mEq/L)    Potassium 3.4 (*) 3.5 - 5.1 (mEq/L)    Chloride 103  96 - 112 (mEq/L)    CO2 24  19 - 32 (mEq/L)    Glucose, Bld 91  70 - 99 (mg/dL)    BUN 6  6 - 23 (mg/dL)    Creatinine, Ser 3.08  0.50 - 1.10 (mg/dL)    Calcium 9.4  8.4 - 10.5 (mg/dL)    Total Protein 7.8  6.0 - 8.3 (g/dL)    Albumin 3.2 (*) 3.5 - 5.2 (g/dL)    AST 19  0 - 37 (U/L)    ALT 17  0 - 35 (U/L)    Alkaline Phosphatase 90  39 - 117 (U/L)    Total Bilirubin 0.3  0.3 - 1.2 (mg/dL)    GFR calc non Af Amer >90  >90 (mL/min)    GFR calc Af Amer >90  >90 (mL/min)     Studies/Results:  04/08/2011   CT ANGIOGRAPHY  NECK Findings: The left vertebral artery originates from the aortic arch, a normal variant.  The right vertebral artery originates from the subclavian.  There is some streak artifact across the arch area is secondary patient body habitus.  No focal stenosis is evident.  The vertebral arteries are within normal limits throughout the remainder of the neck.  The right vertebral artery is slightly dominant to the left.  No focal stenosis is evident.  The right common carotid artery  is within normal limits.  The right internal carotid artery is occluded 8 mm from the bifurcation. There is no proximal reconstitution.  The external carotid artery is within normal limits.  The left common carotid artery is within normal limits.  The bifurcation is unremarkable.  The left internal carotid artery is normal throughout the neck.  Mild degenerative changes are present at C5-6 without significant stenosis.   Review of the MIP images confirms the above findings.  IMPRESSION:  1.  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 2.  The left vertebral artery originates from the aortic arch, a normal variant. 3.  Normal appearance of the left carotid artery.    04/08/2011 CTA HEAD  Findings:  The right caudate head and watershed territory infarcts are redemonstrated.  No hemorrhage or mass lesion is present.  The right internal carotid artery is occluded.  It is reconstituted at the level of the ophthalmic artery.  The A1 and M1 segments fill normally on both  sides.  The MCA bifurcations are within normal limits.  There is significant attenuation of MCA branch vessels bilaterally, more prominently on the right.  The right vertebral artery is the dominant vessel.  The basilar artery is within normal limits.  The PICA origins are not discretely visualized.  Both posterior cerebral arteries originate from basilar tip.  No left posterior communicating artery is present.  No definite right posterior communicating artery is seen. The there is some attenuation of distal PCA branch vessels bilaterally.   Review of the MIP images confirms the above findings.  IMPRESSION:  1.  Occluded right internal carotid artery is reconstituted at the level of the ophthalmic artery. 2.  Moderate small vessel disease. 3.  Significant asymmetric attenuation of right MCA branches versus the left, compatible with diminished perfusion.   CHRISTOPHER W. MATTERN, M.D.   04/07/2011 MRI HEAD WITHOUT CONTRAST    Findings: There is right common carotid occlusion with an absence of flow in that vessel.  There are areas of acute infarction affecting the right basal ganglia, anterior limb internal capsule, scattered foci within the right middle cerebral artery territory and watershed infarctions along the margins of the anterior and middle cerebral arteries on the right.  There is no abnormality of the brainstem or cerebellum.  There is no abnormality of the left cerebral hemisphere.  No evidence of hemorrhage.  No significant swelling or mass effect/shift.  No mass lesion, hydrocephalus or extra-axial collection.  No pituitary mass.  Sinuses, middle ears and mastoids are clear.  IMPRESSION: Right carotid artery occlusion. Acute watershed zone infarctions throughout the right hemisphere along the margins of the anterior and middle cerebral artery territories.  Scattered small cortical infarctions in the right middle cerebral artery territory.  Acute infarction in the right basal ganglia and anterior limb  internal capsule.Thomasenia Sales, M.D.   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 Etiology not clear atherosclerotic versus dissection even though medical history is not suggestive of any neck injury.  Patient has presented beyond window for intervention.  Remains neurologically stable, but with persistent left UE and L grip weakness.   Plan:  1. Aspirin 81 mg a day plus Plavix 75 mg for the next 3 months followed by Plavix alone.  2. Smoking cessation consult recommended.  Would not recommend nicotine replacement in recent stroke case. 3. LDL 153, has been started on simvastatin;  Goal LDL < 100. 4. 2-D  echocardiogram ordered, results pending. 5. PT, OT, ST.   LOS: 2 days   Marya Fossa PA-C [facility] [phone] 04/09/2011  1:44 PM

## 2011-04-10 MED ORDER — SIMVASTATIN 40 MG PO TABS: 40.0000 mg | ORAL_TABLET | Freq: Every day | ORAL | Status: DC

## 2011-04-10 MED ORDER — CARVEDILOL 3.125 MG PO TABS: 3.1250 mg | ORAL_TABLET | Freq: Two times a day (BID) | ORAL | Status: DC

## 2011-04-10 MED ORDER — ASPIRIN 81 MG PO CHEW: 81.0000 mg | CHEWABLE_TABLET | Freq: Every day | ORAL | Status: AC

## 2011-04-10 MED ORDER — CLOPIDOGREL BISULFATE 75 MG PO TABS: 75.0000 mg | ORAL_TABLET | Freq: Every day | ORAL | Status: DC

## 2011-04-10 NOTE — Progress Notes (Signed)
Pt non complaint on using call light, and falling instructions.  Has cigarettes and lighter in room.  Refuses to give them to staff, states she won't smoke in here.  Gets up and walks about in room without calling for assistance.  Disconnects telemetery leads.

## 2011-04-10 NOTE — Discharge Summary (Signed)
PATIENT DETAILS Name: [name] Age: 46 y.o. Sex: female Date of Birth: 1965-05-14 MRN: [id number]. Admit Date: 04/07/2011 Admitting Physician: Jeoffrey Massed PCP:No primary provider on file.  PRIMARY DISCHARGE DIAGNOSIS:  Principal Problem:  *CVA (cerebral infarction) Active Problems:  Carotid artery occlusion  Left hand weakness  HTN (hypertension)      PAST MEDICAL HISTORY: History reviewed. No pertinent past medical history.  DISCHARGE MEDICATIONS: Current Discharge Medication List    START taking these medications   Details  aspirin 81 MG chewable tablet Chew 1 tablet (81 mg total) by mouth daily. Qty: 30 tablet, Refills: 0    carvedilol (COREG) 3.125 MG tablet Take 1 tablet (3.125 mg total) by mouth 2 (two) times daily with a meal. Qty: 60 tablet, Refills: 0    clopidogrel (PLAVIX) 75 MG tablet Take 1 tablet (75 mg total) by mouth daily with breakfast. Qty: 30 tablet, Refills: 0    simvastatin (ZOCOR) 40 MG tablet Take 1 tablet (40 mg total) by mouth daily at 6 PM. Qty: 30 tablet, Refills: 0         BRIEF HPI:  See H&P, Labs, Consult and Test reports for all details in brief, patient was admitted for let arm and left leg weakness, for further details please see the H&P done on admission.  CONSULTATIONS:   neurology  PERTINENT RADIOLOGIC STUDIES: Ct Angio Head W/cm &/or Wo Cm  04/08/2011  *RADIOLOGY REPORT*  Clinical Data:  The right carotid occlusion.  Left arm weakness.  CT ANGIOGRAPHY HEAD AND NECK  Technique:  Multidetector CT imaging of the head and neck was performed using the standard protocol during bolus administration of intravenous contrast.  Multiplanar CT image reconstructions including MIPs were obtained to evaluate the vascular anatomy. Carotid stenosis measurements (when applicable) are obtained utilizing NASCET criteria, using the distal internal carotid diameter as the denominator.  Contrast: 80mL OMNIPAQUE IOHEXOL 350 MG/ML IV SOLN   Comparison:  MRI brain 04/06/2010.  CTA NECK  Findings: The left vertebral artery originates from the aortic arch, a normal variant.  The right vertebral artery originates from the subclavian.  There is some streak artifact across the arch area is secondary patient body habitus.  No focal stenosis is evident.  The vertebral arteries are within normal limits throughout the remainder of the neck.  The right vertebral artery is slightly dominant to the left.  No focal stenosis is evident.  The right common carotid artery is within normal limits.  The right internal carotid artery is occluded 8 mm from the bifurcation. There is no proximal reconstitution.  The external carotid artery is within normal limits.  The left common carotid artery is within normal limits.  The bifurcation is unremarkable.  The left internal carotid artery is normal throughout the neck.  Mild degenerative changes are present at C5-6 without significant stenosis.   Review of the MIP images confirms the above findings.  IMPRESSION:  1.  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 2.  The left vertebral artery originates from the aortic arch, a normal variant. 3.  Normal appearance of the left carotid artery.  CTA HEAD  Findings:  The right caudate head and watershed territory infarcts are redemonstrated.  No hemorrhage or mass lesion is present.  The right internal carotid artery is occluded.  It is reconstituted at the level of the ophthalmic artery.  The A1 and M1 segments fill normally on both sides.  The MCA bifurcations are within normal limits.  There is significant attenuation of MCA branch vessels bilaterally, more prominently on the right.  The right vertebral artery is the dominant vessel.  The basilar artery is within normal limits.  The PICA origins are not discretely visualized.  Both posterior cerebral arteries originate from basilar tip.  No left posterior communicating artery is present.  No definite  right posterior communicating artery is seen. The there is some attenuation of distal PCA branch vessels bilaterally.   Review of the MIP images confirms the above findings.  IMPRESSION:  1.  Occluded right internal carotid artery is reconstituted at the level of the ophthalmic artery. 2.  Moderate small vessel disease. 3.  Significant asymmetric attenuation of right MCA branches versus the left, compatible with diminished perfusion.  Original Report Authenticated By: Jamesetta Orleans. MATTERN, M.D.   Dg Chest 2 View  04/08/2011  *RADIOLOGY REPORT*  Clinical Data: Headache.  Stroke.  CHEST - 2 VIEW  Comparison: 04/08/2011  Findings: Heart size is normal.  No pleural effusion or pulmonary edema.  There is no airspace consolidation identified.  Review of the visualized osseous structures is unremarkable.  IMPRESSION:  1.  No active cardiopulmonary abnormalities.  Original Report Authenticated By: Rosealee Albee, M.D.   Ct Angio Neck W/cm &/or Wo/cm  04/08/2011  *RADIOLOGY REPORT*  Clinical Data:  The right carotid occlusion.  Left arm weakness.  CT ANGIOGRAPHY HEAD AND NECK  Technique:  Multidetector CT imaging of the head and neck was performed using the standard protocol during bolus administration of intravenous contrast.  Multiplanar CT image reconstructions including MIPs were obtained to evaluate the vascular anatomy. Carotid stenosis measurements (when applicable) are obtained utilizing NASCET criteria, using the distal internal carotid diameter as the denominator.  Contrast: 80mL OMNIPAQUE IOHEXOL 350 MG/ML IV SOLN  Comparison:  MRI brain 04/06/2010.  CTA NECK  Findings: The left vertebral artery originates from the aortic arch, a normal variant.  The right vertebral artery originates from the subclavian.  There is some streak artifact across the arch area is secondary patient body habitus.  No focal stenosis is evident.  The vertebral arteries are within normal limits throughout the remainder of the neck.   The right vertebral artery is slightly dominant to the left.  No focal stenosis is evident.  The right common carotid artery is within normal limits.  The right internal carotid artery is occluded 8 mm from the bifurcation. There is no proximal reconstitution.  The external carotid artery is within normal limits.  The left common carotid artery is within normal limits.  The bifurcation is unremarkable.  The left internal carotid artery is normal throughout the neck.  Mild degenerative changes are present at C5-6 without significant stenosis.   Review of the MIP images confirms the above findings.  IMPRESSION:  1.  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 2.  The left vertebral artery originates from the aortic arch, a normal variant. 3.  Normal appearance of the left carotid artery.  CTA HEAD  Findings:  The right caudate head and watershed territory infarcts are redemonstrated.  No hemorrhage or mass lesion is present.  The right internal carotid artery is occluded.  It is reconstituted at the level of the ophthalmic artery.  The A1 and M1 segments fill normally on both sides.  The MCA bifurcations are within normal limits.  There is significant attenuation of MCA branch vessels bilaterally, more prominently on the right.  The right vertebral artery is the dominant vessel.  The basilar artery is within normal limits.  The PICA origins are not discretely visualized.  Both posterior cerebral arteries originate from basilar tip.  No left posterior communicating artery is present.  No definite right posterior communicating artery is seen. The there is some attenuation of distal PCA branch vessels bilaterally.   Review of the MIP images confirms the above findings.  IMPRESSION:  1.  Occluded right internal carotid artery is reconstituted at the level of the ophthalmic artery. 2.  Moderate small vessel disease. 3.  Significant asymmetric attenuation of right MCA branches versus the left,  compatible with diminished perfusion.  Original Report Authenticated By: Jamesetta Orleans. MATTERN, M.D.   Mr Brain Wo Contrast  04/07/2011  *RADIOLOGY REPORT*  Clinical Data: Weakness of the left arm and leg for 2 days.  MRI HEAD WITHOUT CONTRAST  Technique:  Multiplanar, multiecho pulse sequences of the brain and surrounding structures were obtained according to standard protocol without intravenous contrast.  Comparison: None.  Findings: There is right common carotid occlusion with an absence of flow in that vessel.  There are areas of acute infarction affecting the right basal ganglia, anterior limb internal capsule, scattered foci within the right middle cerebral artery territory and watershed infarctions along the margins of the anterior and middle cerebral arteries on the right.  There is no abnormality of the brainstem or cerebellum.  There is no abnormality of the left cerebral hemisphere.  No evidence of hemorrhage.  No significant swelling or mass effect/shift.  No mass lesion, hydrocephalus or extra-axial collection.  No pituitary mass.  Sinuses, middle ears and mastoids are clear.  IMPRESSION: Right carotid artery occlusion. Acute watershed zone infarctions throughout the right hemisphere along the margins of the anterior and middle cerebral artery territories.  Scattered small cortical infarctions in the right middle cerebral artery territory.  Acute infarction in the right basal ganglia and anterior limb internal capsule.  Original Report Authenticated By: Thomasenia Sales, M.D.     PERTINENT LAB RESULTS: CBC:  Basename 04/07/11 1712  WBC 7.9  HGB 12.4  HCT 39.0  PLT 367   CMET CMP     Component Value Date/Time   NA 137 04/09/2011 0640   K 3.4* 04/09/2011 0640   CL 103 04/09/2011 0640   CO2 24 04/09/2011 0640   GLUCOSE 91 04/09/2011 0640   BUN 6 04/09/2011 0640   CREATININE 0.70 04/09/2011 0640   CALCIUM 9.4 04/09/2011 0640   PROT 7.8 04/09/2011 0640   ALBUMIN 3.2* 04/09/2011 0640   AST 19  04/09/2011 0640   ALT 17 04/09/2011 0640   ALKPHOS 90 04/09/2011 0640   BILITOT 0.3 04/09/2011 0640   GFRNONAA >90 04/09/2011 0640   GFRAA >90 04/09/2011 0640    GFR Estimated Creatinine Clearance: 117.2 ml/min (by C-G formula based on Cr of 0.7). No results found for this basename: LIPASE:2,AMYLASE:2 in the last 72 hours No results found for this basename: CKTOTAL:3,CKMB:3,CKMBINDEX:3,TROPONINI:3 in the last 72 hours No components found with this basename: POCBNP:3 No results found for this basename: DDIMER:2 in the last 72 hours  Basename 04/08/11 0615  HGBA1C 5.2    Basename 04/08/11 0615  CHOL 213*  HDL 37*  LDLCALC 153*  TRIG 114  CHOLHDL 5.8  LDLDIRECT --   No results found for this basename: TSH,T4TOTAL,FREET3,T3FREE,THYROIDAB in the last 72 hours No results found for this basename: VITAMINB12:2,FOLATE:2,FERRITIN:2,TIBC:2,IRON:2,RETICCTPCT:2 in the last 72 hours Coags: No results found for this basename: PT:2,INR:2 in the last 72 hours Microbiology: No  results found for this or any previous visit (from the past 240 hour(s)).   BRIEF HOSPITAL COURSE:   Principal Problem:  *CVA (cerebral infarction) -Patient was admitted, underwent the CVA work up, Neurology was consulted, MRI did confirm acute CVA.CTA neck showed 100 percent occlusion of there right common carotid artery-hence patient was not a candidate for Carotid Endarterectomy. - Per Neurology recommendation patient was started on ASA/Plavix. -She continues to have some left upper and lower ext weakness, has been seen by PT/OT and has been recommended to have outpatient PT.Patient has been provoded resources and outpatient PT centers tel no to call and make an appointment.  Tobacco Abuse - I have spent a considerable amount of time speaking with the patient and her two daughters at bedside regarding the importance of completely ceasing tobacco use, she claims understanding. I have also counselled her extensively regarding the  role it plays in cardiovascular disease, however at this point, patient is still not ready to quit smoking.She is alert and oriented and knows the risks of smoking, but she is electing at this point to continue to smoke!!   TODAY-DAY OF DISCHARGE:  Subjective:   Dalphine Cowie today has no headache,no chest abdominal pain,no new weakness tingling or numbness, feels much better wants to go home today.   Objective:   Blood pressure 147/86, pulse 74, temperature 98.8 F (37.1 C), temperature source Oral, resp. rate 18, height 5\' 6"  (1.676 m), weight 117.935 kg (260 lb), last menstrual period 03/27/2011, SpO2 100.00%.  Intake/Output Summary (Last 24 hours) at 04/10/11 1422 Last data filed at 04/10/11 1000  Gross per 24 hour  Intake    840 ml  Output      0 ml  Net    840 ml    Exam Awake Alert, Oriented *3, left upper and lower ext deficits unchanged, Normal affect [place].AT,PERRAL Supple Neck,No JVD, No cervical lymphadenopathy appriciated.  Symmetrical Chest wall movement, Good air movement bilaterally, CTAB RRR,No Gallops,Rubs or new Murmurs, No Parasternal Heave +ve B.Sounds, Abd Soft, Non tender, No organomegaly appriciated, No rebound -guarding or rigidity. No Cyanosis, Clubbing or edema, No new Rash or bruise  DISPOSITION: Home with outpatient PT  DISCHARGE INSTRUCTIONS:    Follow-up Information    Please follow up. (RN will provide number for [facility] for patient to call and make appotintment)          Total Time spent on discharge equals 45 minutes.  SignedJeoffrey Massed 04/10/2011 2:22 PM

## 2011-04-10 NOTE — Progress Notes (Addendum)
Subjective: Pt ready to go home.  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  Plans to do OP PT/OT in [place].  Says she intends to quit smoking after she finishes her last 3 cigs.  Objective: BP 134/84  Pulse 73  Temp 97.3 F   Resp 18  Ht 5\' 6"    WT. 260 lb BMI 41.97 kg/m2  SpO2 95%  LMP 03/27/2011  Intake/Output from previous day: 01/05 0701 - 01/06 0700 In: 480 [P.O.:480] Out: -   Diet: Heart healthy, thin Activity: up ad lib  Medications: . aspirin  81 mg Oral Daily  . clopidogrel  75 mg Oral Q breakfast  . enoxaparin (LOVENOX) injection  40 mg Subcutaneous Q24H  . nicotine  21 mg Transdermal Daily  . simvastatin  40 mg Oral q1800  . zolpidem  5 mg Oral Once   Neurologic Exam: Alert, oriented, thought content appropriate. Speech fluent without evidence of aphasia. Able to follow 3 step commands without difficulty.  Cranial Nerves:  II- Visual fields grossly intact.  III/IV/VI-Extraocular movements intact. Pupils reactive bilaterally.  V/VII-Smile symmetric  VIII-hearing grossly intact  IX/X-normal gag  XI-bilateral shoulder shrug  XII-midline tongue extension  Motor: Has proximal 4+/5 strength L upper extremity, weak grip L hand, and significant pronator drift on the L. LLE strength 5/5. R UE/LE 5/5. Normal muscle bulk.  Sensory: Light touch intact throughout, bilaterally  Deep Tendon Reflexes: 2+ and symmetric throughout  Plantars: Downgoing bilaterally  Cerebellar: deferred.   Lab Results: Results for orders placed during the hospital encounter of 04/07/11 (from the past 48 hour(s))  COMPREHENSIVE METABOLIC PANEL     Status: Abnormal   04/09/11  6:40 AM   Sodium 137  135 - 145 (mEq/L)    Potassium 3.4 (*) 3.5 - 5.1 (mEq/L)    Chloride 103  96 - 112 (mEq/L)    CO2 24  19 - 32 (mEq/L)    Glucose, Bld 91  70 - 99 (mg/dL)    BUN 6  6 - 23 (mg/dL)    Creatinine, Ser 8.29  0.50 - 1.10 (mg/dL)    Calcium 9.4  8.4 - 10.5 (mg/dL)    Total Protein 7.8   6.0 - 8.3 (g/dL)    Albumin 3.2 (*) 3.5 - 5.2 (g/dL)    AST 19  0 - 37 (U/L)    ALT 17  0 - 35 (U/L)    Alkaline Phosphatase 90  39 - 117 (U/L)    Total Bilirubin 0.3  0.3 - 1.2 (mg/dL)    GFR calc non Af Amer >90  >90 (mL/min)    GFR calc Af Amer >90  >90 (mL/min)    Studies/Results: 04/09/11- 2D Echo-LV mild concentric hypertrophy. EF 45% to 50%.  (grade 1 diastolic dysfunction).  Impressions: No cardiac source of embolism was identified, but cannot be ruled out on the basis of this.   Therapies: PT: skilled PT in the acute care venue due to decreased strength; decreased balance; decreased    Coordination; decreased knowledge of use of DME. Min 4X/week  OT: Increased strength proximately. Brunstrom stage IV arm. III - IV hand. Will benefit from neuro outpt. Min 2X/week   Assessment: 46 year old AAF with:  1. CVA; right brain watershed infarcts due to extracranial right internal carotid artery occlusion.    -Remains neurologically stable with persistent left UE and L grip weakness.    - on ASA 81, Plavix 75 for secondary prevention.   -  Echo showed no cardioembolic source.  Carotid occl. felt to be atherosclerotic vs dissection.   2. LVH/DD gr. 1/ borderline elevated BP.  3. Hyperlipidemia-has been started on simvastatin; Goal LDL < 100.   4. Obesity-   5. Tobacco Abuse Rec:  1. Consider adding carvedilol 6.25 mg BID for BP/ HR control given LVH, slightly depressed EF and CVA. 2. Aspirin 81 mg a day plus Plavix 75 mg for the next 3 months followed by Plavix alone.  3. Smoking cessation consult recommended. Would not recommend nicotine replacement in recent stroke case.  4. Nutrition consult; CVA, obesity, hyperlipidemia, HTN  Discussed with Dr. Vickey Huger.  LOS: 3 days   Marya Fossa PA-C [facility] [phone] 04/10/2011  9:10 AM

## 2011-04-10 NOTE — Plan of Care (Signed)
Problem: Phase I Progression Outcomes Goal: Bedrest with HOB 0-30 degrees Outcome: Not Progressing Patient refuses safety measures such as bedrest and walking in room without assistance.

## 2011-08-22 ENCOUNTER — Encounter (HOSPITAL_COMMUNITY): Payer: Self-pay | Admitting: *Deleted

## 2011-08-22 ENCOUNTER — Emergency Department (HOSPITAL_COMMUNITY)
Admission: EM | Admit: 2011-08-22 | Discharge: 2011-08-22 | Disposition: A | Discharge status: Home / Self Care | Payer: Self-pay | Attending: Emergency Medicine | Admitting: Emergency Medicine

## 2011-08-22 DIAGNOSIS — Z79899 Other long term (current) drug therapy: Secondary | ICD-10-CM | POA: Insufficient documentation

## 2011-08-22 DIAGNOSIS — I10 Essential (primary) hypertension: Secondary | ICD-10-CM | POA: Insufficient documentation

## 2011-08-22 DIAGNOSIS — I69998 Other sequelae following unspecified cerebrovascular disease: Secondary | ICD-10-CM | POA: Insufficient documentation

## 2011-08-22 DIAGNOSIS — E7800 Pure hypercholesterolemia, unspecified: Secondary | ICD-10-CM | POA: Insufficient documentation

## 2011-08-22 DIAGNOSIS — M79672 Pain in left foot: Secondary | ICD-10-CM

## 2011-08-22 DIAGNOSIS — F17200 Nicotine dependence, unspecified, uncomplicated: Secondary | ICD-10-CM | POA: Insufficient documentation

## 2011-08-22 DIAGNOSIS — M79609 Pain in unspecified limb: Secondary | ICD-10-CM | POA: Insufficient documentation

## 2011-08-22 DIAGNOSIS — M79642 Pain in left hand: Secondary | ICD-10-CM

## 2011-08-22 DIAGNOSIS — R209 Unspecified disturbances of skin sensation: Secondary | ICD-10-CM | POA: Insufficient documentation

## 2011-08-22 DIAGNOSIS — Z7982 Long term (current) use of aspirin: Secondary | ICD-10-CM | POA: Insufficient documentation

## 2011-08-22 HISTORY — DX: Essential (primary) hypertension: I10

## 2011-08-22 HISTORY — DX: Cerebral infarction, unspecified: I63.9

## 2011-08-22 HISTORY — DX: Pure hypercholesterolemia, unspecified: E78.00

## 2011-08-22 NOTE — ED Provider Notes (Signed)
History   This chart was scribed for Elizabeth Hutching, MD by Clarita Crane. The patient was seen in room APA02/APA02. Patient's care was started at 0929.    CSN: [id number]  Arrival date & time 08/22/11  0865   First MD Initiated Contact with Patient 08/22/11 [phone]      Chief Complaint  Patient presents with  . Numbness    (Consider location/radiation/quality/duration/timing/severity/associated sxs/prior treatment) HPI [name] is a 46 y.o. female who presents to the Emergency Department complaining of constant moderate swelling, numbness and pain to left hand onset 4 months ago following stroke and persistent since. Patient reports having stroke on April 06, 2011 and was initially treated in [place] ED with transfer to [facility] and has residual effects to distal aspects of left side extremities. Denies fever, chills, nausea, vomiting, chest pain. Patient with h/o stroke, HTN, high cholesterol and is a current smoker.   Past Medical History  Diagnosis Date  . Stroke   . Hypertension   . High cholesterol     Past Surgical History  Procedure Date  . Dilation and curettage of uterus     No family history on file.  History  Substance Use Topics  . Smoking status: Current Everyday Smoker    Types: Cigarettes  . Smokeless tobacco: Not on file  . Alcohol Use: Yes     occasional    OB History    Grav Para Term Preterm Abortions TAB SAB Ect Mult Living                  Review of Systems A complete 10 system review of systems was obtained and all systems are negative except as noted in the HPI and PMH.   Allergies  Review of patient's allergies indicates no known allergies.  Home Medications   Current Outpatient Rx  Name Route Sig Dispense Refill  . ASPIRIN 81 MG PO CHEW Oral Chew 1 tablet (81 mg total) by mouth daily. 30 tablet 0  . CARVEDILOL 3.125 MG PO TABS Oral Take 1 tablet (3.125 mg total) by mouth 2 (two) times daily with a meal. 60 tablet 0  .  CLOPIDOGREL BISULFATE 75 MG PO TABS Oral Take 1 tablet (75 mg total) by mouth daily with breakfast. 30 tablet 0  . SIMVASTATIN 40 MG PO TABS Oral Take 1 tablet (40 mg total) by mouth daily at 6 PM. 30 tablet 0    BP 148/94  Pulse 86  Temp(Src) 98.3 F (36.8 C) (Oral)  Resp 20  Ht 5\' 6"  (1.676 m)  Wt 250 lb (113.399 kg)  BMI 40.35 kg/m2  SpO2 98%  LMP 07/13/2011  Physical Exam  Nursing note and vitals reviewed. Constitutional: She is oriented to person, place, and time. She appears well-developed and well-nourished. No distress.  HENT:  Head: Normocephalic and atraumatic.  Eyes: EOM are normal. Pupils are equal, round, and reactive to light.  Neck: Neck supple. No tracheal deviation present.  Cardiovascular: Normal rate and regular rhythm.  Exam reveals no gallop and no friction rub.   No murmur heard. Pulmonary/Chest: Effort normal. No respiratory distress. She has no wheezes. She has no rales.  Abdominal: Soft. She exhibits no distension.  Musculoskeletal:       Swelling and tenderness to thenar eminence of left hand. Unable to move toes of left foot. Weakness of fingers of left hand. Good ROM of LUE and LLE.   Neurological: She is alert and oriented to person, place, and  time. No sensory deficit.  Skin: Skin is warm and dry.  Psychiatric: She has a normal mood and affect. Her behavior is normal.    ED Course  Procedures (including critical care time)  DIAGNOSTIC STUDIES: Oxygen Saturation is 98% on room air, normal by my interpretation.    COORDINATION OF CARE: 10:29AM-Patient informed of current plan for treatment and evaluation and agrees with plan at this time. Will attempt to arrange physical therapy for patient and administer anti inflammatory.     Labs Reviewed - No data to display No results found.   No diagnosis found.    MDM  Patient has residual left hand pain and left toe numbness secondary to stroke in January.  We'll set her up for physical therapy  starting in 2 days. No new injuries or medical problems      I personally performed the services described in this documentation, which was scribed in my presence. The recorded information has been reviewed and considered.    Elizabeth Hutching, MD 08/22/11 [phone]

## 2011-08-22 NOTE — ED Notes (Signed)
Hx of stroke with mild weakness to left hand - reports numbness to left toes and unable to move x 2 wks.  Denies any other sx.

## 2011-08-22 NOTE — Discharge Instructions (Signed)
Physical Therapy appointment Wednesday 08/24/2011 at 0800 at Short Stay Entrance.

## 2011-08-24 ENCOUNTER — Ambulatory Visit (HOSPITAL_COMMUNITY): Payer: Self-pay | Admitting: Physical Therapy

## 2011-08-27 ENCOUNTER — Emergency Department (HOSPITAL_COMMUNITY): Payer: Self-pay

## 2011-08-27 ENCOUNTER — Emergency Department (HOSPITAL_COMMUNITY)
Admission: EM | Admit: 2011-08-27 | Discharge: 2011-08-27 | Disposition: A | Discharge status: Home / Self Care | Payer: Self-pay | Attending: Emergency Medicine | Admitting: Emergency Medicine

## 2011-08-27 ENCOUNTER — Encounter (HOSPITAL_COMMUNITY): Payer: Self-pay

## 2011-08-27 DIAGNOSIS — S93409A Sprain of unspecified ligament of unspecified ankle, initial encounter: Secondary | ICD-10-CM | POA: Insufficient documentation

## 2011-08-27 DIAGNOSIS — E7800 Pure hypercholesterolemia, unspecified: Secondary | ICD-10-CM | POA: Insufficient documentation

## 2011-08-27 DIAGNOSIS — M25579 Pain in unspecified ankle and joints of unspecified foot: Secondary | ICD-10-CM | POA: Insufficient documentation

## 2011-08-27 DIAGNOSIS — X500XXA Overexertion from strenuous movement or load, initial encounter: Secondary | ICD-10-CM | POA: Insufficient documentation

## 2011-08-27 DIAGNOSIS — I69959 Hemiplegia and hemiparesis following unspecified cerebrovascular disease affecting unspecified side: Secondary | ICD-10-CM | POA: Insufficient documentation

## 2011-08-27 DIAGNOSIS — F17200 Nicotine dependence, unspecified, uncomplicated: Secondary | ICD-10-CM | POA: Insufficient documentation

## 2011-08-27 DIAGNOSIS — I10 Essential (primary) hypertension: Secondary | ICD-10-CM | POA: Insufficient documentation

## 2011-08-27 MED ORDER — OXYCODONE-ACETAMINOPHEN 5-325 MG PO TABS
1.0000 | ORAL_TABLET | Freq: Once | ORAL | Status: AC
  Administered 2011-08-27: 1 via ORAL
  Filled 2011-08-27: qty 1

## 2011-08-27 MED ORDER — HYDROCODONE-ACETAMINOPHEN 5-325 MG PO TABS: 1.0000 | ORAL_TABLET | ORAL | Status: AC | PRN

## 2011-08-27 NOTE — ED Notes (Signed)
Assisted to bed. Ice pack on L ankle. Pt given warm blanket and positioned for comfort.

## 2011-08-27 NOTE — ED Notes (Signed)
Discharge instructions reviewed with pt, questions answered. Pt verbalized understanding.  

## 2011-08-27 NOTE — ED Notes (Signed)
Pt BP elevated and pt denies taking medications. Pt placed on acute ED side.

## 2011-08-27 NOTE — Discharge Instructions (Signed)
Ankle Sprain An ankle sprain is an injury to the strong, fibrous tissues (ligaments) that hold the bones of your ankle joint together.  CAUSES Ankle sprain usually is caused by a fall or by twisting your ankle. People who participate in sports are more prone to these types of injuries.  SYMPTOMS  Symptoms of ankle sprain include:  Pain in your ankle. The pain may be present at rest or only when you are trying to stand or walk.   Swelling.   Bruising. Bruising may develop immediately or within 1 to 2 days after your injury.   Difficulty standing or walking.  DIAGNOSIS  Your caregiver will ask you details about your injury and perform a physical exam of your ankle to determine if you have an ankle sprain. During the physical exam, your caregiver will press and squeeze specific areas of your foot and ankle. Your caregiver will try to move your ankle in certain ways. An X-ray exam may be done to be sure a bone was not broken or a ligament did not separate from one of the bones in your ankle (avulsion).  TREATMENT  Certain types of braces can help stabilize your ankle. Your caregiver can make a recommendation for this. Your caregiver may recommend the use of medication for pain. If your sprain is severe, your caregiver may refer you to a surgeon who helps to restore function to parts of your skeletal system (orthopedist) or a physical therapist. HOME CARE INSTRUCTIONS  Apply ice to your injury for 1 to 2 days or as directed by your caregiver. Applying ice helps to reduce inflammation and pain.  Put ice in a plastic bag.   Place a towel between your skin and the bag.   Leave the ice on for 15 to 20 minutes at a time, every 2 hours while you are awake.   Take over-the-counter or prescription medicines for pain, discomfort, or fever only as directed by your caregiver.   Keep your injured leg elevated, when possible, to lessen swelling.   If your caregiver recommends crutches, use them as  instructed. Gradually, put weight on the affected ankle. Continue to use crutches or a cane until you can walk without feeling pain in your ankle.   If you have a plaster splint, wear the splint as directed by your caregiver. Do not rest it on anything harder than a pillow the first 24 hours. Do not put weight on it. Do not get it wet. You may take it off to take a shower or bath.   You may have been given an elastic bandage to wear around your ankle to provide support. If the elastic bandage is too tight (you have numbness or tingling in your foot or your foot becomes cold and blue), adjust the bandage to make it comfortable.   If you have an air splint, you may blow more air into it or let air out to make it more comfortable. You may take your splint off at night and before taking a shower or bath.   Wiggle your toes in the splint several times per day if you are able.  SEEK MEDICAL CARE IF:   You have an increase in bruising, swelling, or pain.   Your toes feel cold.   Pain relief is not achieved with medication.  SEEK IMMEDIATE MEDICAL CARE IF: Your toes are numb or blue or you have severe pain. MAKE SURE YOU:   Understand these instructions.   Will watch your condition.     Will get help right away if you are not doing well or get worse.  Document Released: 03/21/2005 Document Revised: 03/10/2011 Document Reviewed: 10/24/2007 [facility] Patient Information 2012 [place], [place].   Your history of your injury and your symptoms tonight are most consistent with an ankle sprain,  But your xray shows the possibility of a small avulsion fracture, as discussed.  Wear the cam walker at all times when awake.  Ice and elevation may help with your pain and swelling.  Get a walker in the morning using the prescription - go to medical supply for this.  Do not drive within 4 hours of taking hydrocodone as this will make you drowsy.

## 2011-08-27 NOTE — ED Notes (Signed)
Pt presents with left ankle pain since 1700 today. Pt denies injury but pt states she has Hx of CVA and left ankle always is swollen.

## 2011-08-29 NOTE — ED Provider Notes (Signed)
History     CSN: [id number]  Arrival date & time 08/27/11  1911   First MD Initiated Contact with Patient 08/27/11 2235      Chief Complaint  Patient presents with  . Ankle Pain    (Consider location/radiation/quality/duration/timing/severity/associated sxs/prior treatment) HPI Comments: [name] presents for evaluation of left ankle pain.  She is in the recovery stages of a cva which resulted in left sided weakness and is anticipating starting physical therapy next week.  She ambulates but with difficulty and has difficulty due to weakness in her left foot and ankle.  She states she may have twisted her left foot today while stepping, but is unsure.  She reports pain in the left lateral ankle.  She has swelling at this site as well, but reports this is chronic.  She has chronic numbness in this extremity as well as her left hand.  She is concerned for injury in the left ankle as she is motivated to start PT and does not want this to delay her therapy.  Her pain is constant,  Worse with flexion of her ankle and with palpation.  She has taken no medicines for this nor has she applied ice.  There is no radiation of pain.  The history is provided by the patient.    Past Medical History  Diagnosis Date  . Stroke   . Hypertension   . High cholesterol     Past Surgical History  Procedure Date  . Dilation and curettage of uterus     No family history on file.  History  Substance Use Topics  . Smoking status: Current Everyday Smoker    Types: Cigarettes  . Smokeless tobacco: Not on file  . Alcohol Use: Yes     occasional    OB History    Grav Para Term Preterm Abortions TAB SAB Ect Mult Living                  Review of Systems  Constitutional: Negative for fever.  HENT: Negative.   Eyes: Negative.   Respiratory: Negative for shortness of breath.   Cardiovascular: Negative.   Gastrointestinal: Negative.   Musculoskeletal: Positive for joint swelling and  arthralgias.  Skin: Negative.  Negative for rash and wound.  Neurological: Positive for weakness and numbness. Negative for light-headedness.  Hematological: Negative.   Psychiatric/Behavioral: Negative.     Allergies  Review of patient's allergies indicates no known allergies.  Home Medications   Current Outpatient Rx  Name Route Sig Dispense Refill  . ASPIRIN 81 MG PO CHEW Oral Chew 1 tablet (81 mg total) by mouth daily. 30 tablet 0  . CARVEDILOL 3.125 MG PO TABS Oral Take 1 tablet (3.125 mg total) by mouth 2 (two) times daily with a meal. 60 tablet 0  . SIMVASTATIN 40 MG PO TABS Oral Take 1 tablet (40 mg total) by mouth daily at 6 PM. 30 tablet 0  . HYDROCODONE-ACETAMINOPHEN 5-325 MG PO TABS Oral Take 1 tablet by mouth every 4 (four) hours as needed for pain. 20 tablet 0    BP 134/101  Pulse 98  Temp(Src) 98.4 F (36.9 C) (Oral)  Resp 20  Ht 5\' 6"  (1.676 m)  Wt 250 lb (113.399 kg)  BMI 40.35 kg/m2  SpO2 100%  LMP 07/13/2011  Physical Exam  Constitutional: She is oriented to person, place, and time. She appears well-developed and well-nourished.  HENT:  Head: Atraumatic.  Neck: Normal range of motion.  Cardiovascular:  Pulses:      Dorsalis pedis pulses are 2+ on the right side, and 2+ on the left side.  Pulmonary/Chest: Breath sounds normal. She has no rales.  Musculoskeletal: She exhibits tenderness.       Left ankle: She exhibits swelling. She exhibits normal range of motion, no ecchymosis, no deformity and normal pulse. tenderness. Lateral malleolus tenderness found. No proximal fibula tenderness found.       Swelling is localized to left lateral malleolus and anterior ankle.  She is not point tender over anterior ankle.  Neurological: She is alert and oriented to person, place, and time. She has normal strength. A sensory deficit is present.       Pt does have weakness in flexion and plantar flexion of left ankle.    Skin: Skin is warm and dry.  Psychiatric: She  has a normal mood and affect.    ED Course  Procedures (including critical care time)  Labs Reviewed - No data to display Dg Ankle Complete Left  08/27/2011  *RADIOLOGY REPORT*  Clinical Data: Left ankle pain  LEFT ANKLE COMPLETE - 3+ VIEW  Comparison: None.  Findings: Mild irregularity of the anterior aspect of the distal tibia, correlate with the site of the patient's pain.  The ankle mortise is otherwise intact.  The base of the fifth metatarsal is unremarkable.  Mild soft tissue swelling overlying the dorsal aspect of the ankle.  IMPRESSION: Mild irregularity of the anterior aspect of the distal tibia, correlate with the site of the patient's pain to exclude acute fracture.  Overlying mild dorsal soft tissue swelling.  Original Report Authenticated By: Charline Bills, M.D.     1. Ankle sprain       MDM  xrays reviewed.  Pt placed in cam walker and prescribed walker.  She was prescribed hydrocodone,  Encouraged RICE,  And encouraged her to go to her first PT next week as the therapist can help also with her new injury.  Discussed irregularity at anterior distal tibia seen on xray.  Although pt non tender at this site,  Encouraged recheck by ortho for further eval.  Referral given.  Discussed bp.  Pt states has not taken her evening dose of coreg - encouraged to take as soon as she gets home.         Burgess Amor, PA 08/29/11 1331

## 2011-08-31 ENCOUNTER — Ambulatory Visit (HOSPITAL_COMMUNITY)
Admission: RE | Admit: 2011-08-31 | Discharge: 2011-08-31 | Disposition: A | Discharge status: Home / Self Care | Payer: Self-pay | Source: Ambulatory Visit | Attending: Emergency Medicine | Admitting: Emergency Medicine

## 2011-08-31 DIAGNOSIS — R269 Unspecified abnormalities of gait and mobility: Secondary | ICD-10-CM | POA: Insufficient documentation

## 2011-08-31 DIAGNOSIS — I10 Essential (primary) hypertension: Secondary | ICD-10-CM | POA: Insufficient documentation

## 2011-08-31 DIAGNOSIS — R262 Difficulty in walking, not elsewhere classified: Secondary | ICD-10-CM | POA: Insufficient documentation

## 2011-08-31 DIAGNOSIS — IMO0001 Reserved for inherently not codable concepts without codable children: Secondary | ICD-10-CM | POA: Insufficient documentation

## 2011-08-31 DIAGNOSIS — M79609 Pain in unspecified limb: Secondary | ICD-10-CM | POA: Insufficient documentation

## 2011-08-31 DIAGNOSIS — R531 Weakness: Secondary | ICD-10-CM | POA: Insufficient documentation

## 2011-08-31 DIAGNOSIS — M6281 Muscle weakness (generalized): Secondary | ICD-10-CM | POA: Insufficient documentation

## 2011-08-31 DIAGNOSIS — R2689 Other abnormalities of gait and mobility: Secondary | ICD-10-CM | POA: Insufficient documentation

## 2011-08-31 NOTE — Evaluation (Signed)
Physical Therapy Evaluation  Patient Details  Name: [name] MRN: [id number] Date of Birth: 01-17-1966  Today's Date: 08/31/2011 Time: 0900-1000 PT Time Calculation (min): 60 min  Visit#: 1  of 24   Re-eval: 09/30/11 Assessment Diagnosis: CVA Prior Therapy: acute IP  Authorization: Pt is appealing medicaid denial  Authorization Time Period: none at this time  Authorization Visit#: 1  of 12    Past Medical History:  Past Medical History  Diagnosis Date  . Stroke   . Hypertension   . High cholesterol    Past Surgical History:  Past Surgical History  Procedure Date  . Dilation and curettage of uterus     Subjective Symptoms/Limitations Symptoms: [name] states that she had her stroke Jan.2.  She states that she was was admitted into [facility] until Jan. 6th.  She did not have any therapy after this due to not having any insurance.  She is currently appealing a decline of Medicaid services.  She does not have a MD. The patient states that she got up Saturday and when she went to turn to get into her chair her ankle twisted and she heard a "pop".  She went to the ER.  She has been referred to PT by [name] from the ER after being seen there for a possible tibia fracture.    The patient is to call [name] for a follow up.  She states she is having difficulty showering and bathing, she states that before she rolled her left ankle it gave her no support and  would roll when she got up on it.  She is currently not walking at home she is in a wheelchair.  She states that she is having difficulty using her left hand and has quite a bit a numbness in it.  She is currently being referred to out-patient physical therapy to try and improve her functional ability. How long can you sit comfortably?: no problem How long can you stand comfortably?: The patient states that she is unable to stand greater than ten minutes How long can you walk comfortably?: The patient states the  longers she has walked for with a walker has been ten minutes. Pain Assessment Currently in Pain?: No/denies Pain Score:  (Pt does get pain in the back of her calf)  Precautions/Restrictions  Precautions Precautions: Fall Restrictions Weight Bearing Restrictions: Yes LLE Weight Bearing: Weight bearing as tolerated (This has not been verified with ortho) Other Position/Activity Restrictions: CAM Boot  Prior Functioning  Home Living Lives With: Family Type of Home: House Home Access: Stairs to enter Secretary/administrator of Steps:  (3) Entrance Stairs-Rails: None Home Layout: One level Prior Function Level of Independence: Independent with basic ADLs Able to Take Stairs?: Reciprically Driving: Yes Vocation: Full time employment 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) Leisure: Hobbies-yes (Comment) Comments: play cards  Cognition/Observation Cognition Overall Cognitive Status: Appears within functional limits for tasks assessed  Sensation/Coordination/Flexibility/Functional Tests Sensation Proprioception: Impaired by gross assessment Coordination Gross Motor Movements are Fluid and Coordinated: No Functional Tests Functional Tests: LEFS at 60% Functional Tests: ABCbalance confidence scale 20.8%  Assessment RLE Strength Right Hip Flexion: 3/5 Right Hip Extension: 4/5 Right Hip ABduction: 5/5 Right Hip ADduction: 5/5 Right Knee Flexion: 5/5 Right Knee Extension: 5/5 Right Ankle Dorsiflexion: 5/5 Right Ankle Plantar Flexion: 5/5 Right Ankle Inversion: 5/5 Right Ankle Eversion: 5/5 LLE Strength Left Hip Flexion: 3-/5 Left Hip Extension: 3/5 Left Hip ABduction: 3/5 Left Hip ADduction: 2/5 Left Knee  Flexion: 1/5 Left Knee Extension: 3/5  Exercise/Treatments     eated Long Arc Quad: Left;10 reps Supine Straight Leg Raises: Both;5 reps Sidelying Hip ABduction: Left;5 reps Other Sidelying Knee Exercises: AA knee flex x 5 Prone  Hip Extension: Both;5  reps    Physical Therapy Assessment and Plan PT Assessment and Plan Clinical Impression Statement: Pt with recent stroke who has a possible L tibial fracture due to loss of balance on 08/27/2011.  PTwill benefit from both OT and PT services as pt has limited use of L LE and UE due to weakness.  L ankle MM and  ROM was not tested today due to the fact that the patient has not seen the orthopedic MD yet,(she was placed in a cam boot at the ER on Saturday).  Pt will benefit from physical therapy to improve safety and I in mobility to improve her quality of life. Pt will benefit from skilled therapeutic intervention in order to improve on the following deficits: Abnormal gait;Decreased activity tolerance;Decreased strength;Difficulty walking;Decreased mobility;Decreased safety awareness Rehab Potential: Good PT Frequency: Min 3X/week PT Duration: 8 weeks PT Treatment/Interventions: Gait training;Therapeutic activities;Therapeutic exercise;Balance training;Neuromuscular re-education;Patient/family education PT Plan: Begin side-lying ham curls, supine clams, sidelying clams, bent knee raise, .as well as starting balance activities.    Goals Home Exercise Program Pt will Perform Home Exercise Program: Independently PT Short Term Goals Time to Complete Short Term Goals: 2 weeks PT Short Term Goal 1: Pt to be walking in her house with walker. PT Short Term Goal 2: Pt to be able to stand for 20 minutes without fatigut to make a meal. PT Long Term Goals Time to Complete Long Term Goals: 4 weeks PT Long Term Goal 1: Pt to be able to walk with walker for 60 minutes without difficulty PT Long Term Goal 2: Pt to be able to stand for 40 minutes without difficulty Long Term Goal 3: Pt LEFS to increase by 10 points  Long Term Goal 4: PT ABC to be iimproved by 20%  Problem List Patient Active Problem List  Diagnoses  . CVA (cerebral infarction)  . Carotid artery occlusion  . Left hand weakness  . HTN  (hypertension)  . Difficulty in walking  . Unstable balance  . Weakness of left side of body    PT - End of Session Equipment Utilized During Treatment: Gait belt Activity Tolerance: Patient tolerated treatment well General Behavior During Session: [facility] for tasks performed Cognition: [facility] for tasks performed PT Plan of Care PT Home Exercise Plan: given Consulted and Agree with Plan of Care: Patient  GP  Functional Reporting Modifier  Current Status  [phone] - Mobility: Walking & Moving Around CM - At least 80% but less than 100% impaired, limited or restricted  Goal Status  [id number] - Mobility: [place] & Moving Around CJ - At least 20% but less than 40% impaired, limited or restricted   Breckin Zafar,CINDY 08/31/2011, 10:18 AM  Physician Documentation Your signature is required to indicate approval of the treatment plan as stated above.  Please sign and either send electronically or make a copy of this report for your files and return this physician signed original.   Please mark one 1.__approve of plan  2. ___approve of plan with the following conditions.   ______________________________  _____________________ Physician Signature                                                                                                             Date

## 2011-09-01 NOTE — ED Provider Notes (Signed)
Medical screening examination/treatment/procedure(s) were performed by non-physician practitioner and as supervising physician I was immediately available for consultation/collaboration.   Saraiah Bhat M Kerby Hockley, MD 09/01/11 0053 

## 2011-09-05 ENCOUNTER — Telehealth (HOSPITAL_COMMUNITY): Payer: Self-pay

## 2011-09-05 ENCOUNTER — Inpatient Hospital Stay (HOSPITAL_COMMUNITY): Admission: RE | Admit: 2011-09-05 | Payer: Self-pay | Source: Ambulatory Visit | Admitting: Physical Therapy

## 2011-09-07 ENCOUNTER — Ambulatory Visit (HOSPITAL_COMMUNITY)
Admission: RE | Admit: 2011-09-07 | Discharge: 2011-09-07 | Disposition: A | Discharge status: Home / Self Care | Payer: Self-pay | Source: Ambulatory Visit | Attending: Emergency Medicine | Admitting: Emergency Medicine

## 2011-09-07 DIAGNOSIS — R269 Unspecified abnormalities of gait and mobility: Secondary | ICD-10-CM | POA: Insufficient documentation

## 2011-09-07 DIAGNOSIS — IMO0001 Reserved for inherently not codable concepts without codable children: Secondary | ICD-10-CM | POA: Insufficient documentation

## 2011-09-07 DIAGNOSIS — I10 Essential (primary) hypertension: Secondary | ICD-10-CM | POA: Insufficient documentation

## 2011-09-07 DIAGNOSIS — M6281 Muscle weakness (generalized): Secondary | ICD-10-CM | POA: Insufficient documentation

## 2011-09-07 DIAGNOSIS — M79609 Pain in unspecified limb: Secondary | ICD-10-CM | POA: Insufficient documentation

## 2011-09-07 NOTE — Progress Notes (Signed)
Physical Therapy Treatment Patient Details  Name: [name] MRN: [id number] Date of Birth: May 30, 1965  Today's Date: 09/07/2011 Time: 1110-1150 PT Time Calculation (min): 40 min  Visit#: 2  of 24   Re-eval: 09/30/11 Charges: Therex x 38'  Authorization: Pt is appealing medicaid denial  Authorization Time Period: none at this time  Authorization Visit#: 2  of 12    Subjective: Symptoms/Limitations Symptoms: Pt is pain free but states that her L foot has been swollen all week. Pain Assessment Currently in Pain?: No/denies Pain Score: 0-No pain   Exercise/Treatments Seated Long Arc Quad: 15 reps;Left Supine Straight Leg Raises: 10 reps;Left Other Supine Knee Exercises: clams x 10 B Other Supine Knee Exercises: bent knee raise x 10 Sidelying Hip ABduction: 10 reps;Left Clams: 5x10" Other Sidelying Knee Exercises: AA knee flex x 5 Prone  Hamstring Curl: 5 sets;Limitations Hamstring Curl Limitations: 1/2 available range only with mm tapping Hip Extension: 10 reps;Limitations Hip Extension Limitations: with mm tapping   Physical Therapy Assessment and Plan PT Assessment and Plan Clinical Impression Statement: Tx focus on mm strengthening this session. Pt responds well to mm tapping to facilitate mm contraction. Pt often says "I can't do it." but is able to complete activities after encouragement and mm facilitation. Pt  has yet to see orthopedic MD and is still wearing CAM boot. Pt reports 0/10 pain at end of session PT Plan: Continue to progress per PT POC. Begin balance activities as well as continuing therex next session.     Problem List Patient Active Problem List  Diagnoses  . CVA (cerebral infarction)  . Carotid artery occlusion  . Left hand weakness  . HTN (hypertension)  . Difficulty in walking  . Unstable balance  . Weakness of left side of body    PT - End of Session Activity Tolerance: Patient tolerated treatment well General Behavior During  Session: [facility] for tasks performed Cognition: [facility] for tasks performed    Seth Bake, PTA 09/07/2011, 11:57 AM

## 2011-09-09 ENCOUNTER — Inpatient Hospital Stay (HOSPITAL_COMMUNITY): Admission: RE | Admit: 2011-09-09 | Payer: Self-pay | Source: Ambulatory Visit

## 2011-09-12 ENCOUNTER — Telehealth (HOSPITAL_COMMUNITY): Payer: Self-pay

## 2011-09-12 ENCOUNTER — Inpatient Hospital Stay (HOSPITAL_COMMUNITY): Admission: RE | Admit: 2011-09-12 | Payer: Self-pay | Source: Ambulatory Visit | Admitting: Physical Therapy

## 2011-09-14 ENCOUNTER — Inpatient Hospital Stay (HOSPITAL_COMMUNITY): Admission: RE | Admit: 2011-09-14 | Payer: Self-pay | Source: Ambulatory Visit | Admitting: Physical Therapy

## 2011-09-19 ENCOUNTER — Telehealth (HOSPITAL_COMMUNITY): Payer: Self-pay

## 2011-09-19 ENCOUNTER — Ambulatory Visit (HOSPITAL_COMMUNITY): Payer: Self-pay | Admitting: *Deleted

## 2011-09-21 ENCOUNTER — Telehealth (HOSPITAL_COMMUNITY): Payer: Self-pay

## 2011-09-21 ENCOUNTER — Ambulatory Visit (HOSPITAL_COMMUNITY): Payer: Self-pay | Admitting: Physical Therapy

## 2011-09-23 ENCOUNTER — Inpatient Hospital Stay (HOSPITAL_COMMUNITY): Admission: RE | Admit: 2011-09-23 | Payer: Self-pay | Source: Ambulatory Visit | Admitting: Physical Therapy

## 2011-09-26 ENCOUNTER — Ambulatory Visit (HOSPITAL_COMMUNITY): Payer: Self-pay | Admitting: Physical Therapy

## 2011-09-28 ENCOUNTER — Ambulatory Visit (HOSPITAL_COMMUNITY)
Admission: RE | Admit: 2011-09-28 | Discharge: 2011-09-28 | Disposition: A | Discharge status: Home / Self Care | Payer: Self-pay | Source: Ambulatory Visit | Attending: Emergency Medicine | Admitting: Emergency Medicine

## 2011-09-28 NOTE — Progress Notes (Signed)
Physical Therapy Treatment Patient Details  Name: [name] MRN: [id number] Date of Birth: Nov 30, 1965  Today's Date: 09/28/2011 Time: 1100-1150 PT Time Calculation (min): 50 min  Visit#: 3  of 24   Re-eval: 09/30/11 Charges: Therex x 20' NMR x 18'  Authorization: Pt is appealing medicaid denial  Authorization Time Period: none at this time  Authorization Visit#: 3  of 12    Subjective: Symptoms/Limitations Symptoms: Pt states that she has been unable to come to therapy because her daughter's car was in the shop. Pain Assessment Currently in Pain?: No/denies  Exercise/Treatments Standing Other Standing Knee Exercises: Static stangin x 2' ; Wt shifts R/L x 1' Other Standing Knee Exercises: Standing wt shifting to touch multiple places on wall Seated Long Arc Quad: 15 reps;Left Supine Straight Leg Raises: 10 reps;Left Other Supine Knee Exercises: clams x 10 B Other Supine Knee Exercises: bent knee raise x 10  Physical Therapy Assessment and Plan PT Assessment and Plan Clinical Impression Statement: Pt presents to therapy in CAM boot. Pt states she has not seen orthopedic doctor about her L foot/ankle. Pt complains of discomfort in shoulder. Pt educated on occupational therapy and is interested in coming in for OT eval. Therapist will attempt to contact ER doctor who referred pt to PT. Began standing balance activities with moderate cueing to put wt through L LE. PT Plan: Reassess next session.     Problem List Patient Active Problem List  Diagnosis  . CVA (cerebral infarction)  . Carotid artery occlusion  . Left hand weakness  . HTN (hypertension)  . Difficulty in walking  . Unstable balance  . Weakness of left side of body    PT - End of Session Activity Tolerance: Patient tolerated treatment well General Behavior During Session: [facility] for tasks performed Cognition: [facility] for tasks performed   Seth Bake, PTA 09/28/2011, 12:08 PM

## 2011-09-30 ENCOUNTER — Ambulatory Visit (HOSPITAL_COMMUNITY): Payer: Self-pay | Admitting: Physical Therapy

## 2011-10-19 ENCOUNTER — Inpatient Hospital Stay (HOSPITAL_COMMUNITY): Admission: RE | Admit: 2011-10-19 | Payer: Self-pay | Source: Ambulatory Visit | Admitting: Specialist

## 2012-12-05 ENCOUNTER — Other Ambulatory Visit (HOSPITAL_COMMUNITY): Payer: Self-pay | Admitting: Family Medicine

## 2012-12-05 DIAGNOSIS — N6310 Unspecified lump in the right breast, unspecified quadrant: Secondary | ICD-10-CM

## 2012-12-12 ENCOUNTER — Other Ambulatory Visit (HOSPITAL_COMMUNITY): Payer: Self-pay | Admitting: Family Medicine

## 2012-12-12 ENCOUNTER — Ambulatory Visit (HOSPITAL_COMMUNITY)
Admission: RE | Admit: 2012-12-12 | Discharge: 2012-12-12 | Disposition: A | Discharge status: Home / Self Care | Payer: PRIVATE HEALTH INSURANCE | Source: Ambulatory Visit | Attending: Family Medicine | Admitting: Family Medicine

## 2012-12-12 DIAGNOSIS — N6310 Unspecified lump in the right breast, unspecified quadrant: Secondary | ICD-10-CM

## 2012-12-12 DIAGNOSIS — N63 Unspecified lump in unspecified breast: Secondary | ICD-10-CM | POA: Insufficient documentation

## 2013-01-15 ENCOUNTER — Other Ambulatory Visit (HOSPITAL_COMMUNITY): Payer: Self-pay | Admitting: Nurse Practitioner

## 2013-01-15 DIAGNOSIS — N6310 Unspecified lump in the right breast, unspecified quadrant: Secondary | ICD-10-CM

## 2013-01-23 ENCOUNTER — Ambulatory Visit (HOSPITAL_COMMUNITY): Payer: Self-pay

## 2013-01-28 ENCOUNTER — Emergency Department (HOSPITAL_COMMUNITY)
Admission: EM | Admit: 2013-01-28 | Discharge: 2013-01-28 | Disposition: A | Discharge status: Home / Self Care | Payer: PRIVATE HEALTH INSURANCE | Attending: Emergency Medicine | Admitting: Emergency Medicine

## 2013-01-28 ENCOUNTER — Encounter (HOSPITAL_COMMUNITY): Payer: Self-pay | Admitting: Emergency Medicine

## 2013-01-28 DIAGNOSIS — M549 Dorsalgia, unspecified: Secondary | ICD-10-CM | POA: Insufficient documentation

## 2013-01-28 DIAGNOSIS — M25569 Pain in unspecified knee: Secondary | ICD-10-CM | POA: Insufficient documentation

## 2013-01-28 DIAGNOSIS — R110 Nausea: Secondary | ICD-10-CM | POA: Insufficient documentation

## 2013-01-28 DIAGNOSIS — E7800 Pure hypercholesterolemia, unspecified: Secondary | ICD-10-CM | POA: Insufficient documentation

## 2013-01-28 DIAGNOSIS — F17200 Nicotine dependence, unspecified, uncomplicated: Secondary | ICD-10-CM | POA: Insufficient documentation

## 2013-01-28 DIAGNOSIS — Z8673 Personal history of transient ischemic attack (TIA), and cerebral infarction without residual deficits: Secondary | ICD-10-CM | POA: Insufficient documentation

## 2013-01-28 DIAGNOSIS — R531 Weakness: Secondary | ICD-10-CM

## 2013-01-28 DIAGNOSIS — R5381 Other malaise: Secondary | ICD-10-CM | POA: Insufficient documentation

## 2013-01-28 DIAGNOSIS — I10 Essential (primary) hypertension: Secondary | ICD-10-CM | POA: Insufficient documentation

## 2013-01-28 LAB — COMPREHENSIVE METABOLIC PANEL
ALT: 14 U/L (ref 0–35)
AST: 19 U/L (ref 0–37)
Albumin: 3.9 g/dL (ref 3.5–5.2)
Alkaline Phosphatase: 93 U/L (ref 39–117)
BUN: 9 mg/dL (ref 6–23)
CO2: 27 mEq/L (ref 19–32)
Chloride: 100 mEq/L (ref 96–112)
Potassium: 3.5 mEq/L (ref 3.5–5.1)
Total Bilirubin: 0.3 mg/dL (ref 0.3–1.2)
Total Protein: 8.6 g/dL — ABNORMAL HIGH (ref 6.0–8.3)

## 2013-01-28 LAB — CBC WITH DIFFERENTIAL/PLATELET
Basophils Absolute: 0 10*3/uL (ref 0.0–0.1)
Basophils Relative: 0 % (ref 0–1)
Hemoglobin: 12.4 g/dL (ref 12.0–15.0)
Lymphocytes Relative: 31 % (ref 12–46)
MCH: 29.2 pg (ref 26.0–34.0)
MCHC: 33.2 g/dL (ref 30.0–36.0)
Monocytes Relative: 6 % (ref 3–12)
Neutro Abs: 4.4 10*3/uL (ref 1.7–7.7)
Neutrophils Relative %: 61 % (ref 43–77)
RDW: 14 % (ref 11.5–15.5)
WBC: 7.3 10*3/uL (ref 4.0–10.5)

## 2013-01-28 NOTE — ED Notes (Signed)
Pt states that she feels week from her waist to her legs on both sides, also co back pain.

## 2013-01-28 NOTE — ED Provider Notes (Signed)
CSN: [id number]     Arrival date & time 01/28/13  1205 History  This chart was scribed for Benny Lennert, MD by Bennett Scrape, ED Scribe. This patient was seen in room APA10/APA10 and the patient's care was started at 12:44 PM.   Chief Complaint  Patient presents with  . Weakness    Patient is a 47 y.o. female presenting with weakness. The history is provided by the patient. No language interpreter was used.  Weakness This is a new problem. The current episode started more than 1 week ago. The problem occurs constantly. The problem has been gradually worsening. Pertinent negatives include no chest pain, no abdominal pain and no headaches. The symptoms are aggravated by walking. The symptoms are relieved by rest. She has tried nothing for the symptoms.    HPI Comments: [name] is a 47 y.o. female who presents to the Emergency Department complaining of diffuse weakness from the waist down described as "feeling run down" for the past several months. She states that she has followed up with her PCP for the same and has been advised to walk. She states that ambulation worsens the symptoms and causes diffuse pain, especially in her back and knees. She denies any recent fevers or emessi. She has a h/o CVA with residual left-sided deficits. She states that at baseline she can ambulate with a cane.   Past Medical History  Diagnosis Date  . Stroke   . Hypertension   . High cholesterol    Past Surgical History  Procedure Laterality Date  . Dilation and curettage of uterus     History reviewed. No pertinent family history. History  Substance Use Topics  . Smoking status: Current Every Day Smoker    Types: Cigarettes  . Smokeless tobacco: Not on file  . Alcohol Use: Yes     Comment: occasional   No OB history provided.  Review of Systems  Constitutional: Negative for appetite change and fatigue.  HENT: Negative for congestion, ear discharge and sinus pressure.   Eyes:  Negative for discharge.  Respiratory: Negative for cough.   Cardiovascular: Negative for chest pain.  Gastrointestinal: Negative for abdominal pain and diarrhea.  Genitourinary: Negative for frequency and hematuria.  Musculoskeletal: Positive for arthralgias and back pain.  Skin: Negative for rash.  Neurological: Positive for weakness. Negative for seizures and headaches.  Psychiatric/Behavioral: Negative for hallucinations.    Allergies  Review of patient's allergies indicates no known allergies.  Home Medications   Current Outpatient Rx  Name  Route  Sig  Dispense  Refill  . EXPIRED: carvedilol (COREG) 3.125 MG tablet   Oral   Take 1 tablet (3.125 mg total) by mouth 2 (two) times daily with a meal.   60 tablet   0   . EXPIRED: simvastatin (ZOCOR) 40 MG tablet   Oral   Take 1 tablet (40 mg total) by mouth daily at 6 PM.   30 tablet   0    Triage vitals: BP 123/84  Pulse 102  Temp(Src) 98.1 F (36.7 C) (Oral)  Resp 20  Ht 5\' 6"  (1.676 m)  Wt 265 lb (120.203 kg)  BMI 42.79 kg/m2  SpO2 98%  LMP 07/29/2012  Physical Exam  Nursing note and vitals reviewed. Constitutional: She is oriented to person, place, and time. She appears well-developed and well-nourished.  HENT:  Head: Normocephalic and atraumatic.  Eyes: Conjunctivae and EOM are normal. No scleral icterus.  Neck: Neck supple. No thyromegaly present.  Cardiovascular:  Normal rate and regular rhythm.  Exam reveals no gallop and no friction rub.   No murmur heard. Pulmonary/Chest: Effort normal and breath sounds normal. No stridor. She has no wheezes. She has no rales. She exhibits no tenderness.  Abdominal: Soft. She exhibits no distension. There is no tenderness. There is no rebound.  Musculoskeletal: Normal range of motion. She exhibits no edema.  Lymphadenopathy:    She has no cervical adenopathy.  Neurological: She is alert and oriented to person, place, and time. She exhibits normal muscle tone.  Coordination normal.  Moderate to severe weakness in left leg and left arm from prior CVA  Skin: Skin is warm and dry. No rash noted. No erythema.  Psychiatric: She has a normal mood and affect. Her behavior is normal.    ED Course  Procedures (including critical care time)  DIAGNOSTIC STUDIES: Oxygen Saturation is 98% on room air, normal by my interpretation.    COORDINATION OF CARE: 12:48 PM-Discussed treatment plan which includes CBC panel, CMP and UA with pt at bedside and pt agreed to plan.   Labs Review Labs Reviewed - No data to display Imaging Review No results found.  EKG Interpretation   None       MDM  No diagnosis found.  The chart was scribed for me under my direct supervision.  I personally performed the history, physical, and medical decision making and all procedures in the evaluation of this patient.Benny Lennert, MD 01/28/13 1500

## 2013-01-28 NOTE — ED Notes (Addendum)
Weakness "from waist to my legs"  And pain in same area. For months. Nausea, no vomiting,,no diarrhea , no fever.  Hx of stroke 2013 affecting lt side of body.  Walks with a cane.Says she fell yesterday.

## 2013-01-28 NOTE — ED Notes (Signed)
Discharge instructions reviewed with pt, questions answered. Pt verbalized understanding.  

## 2013-01-30 ENCOUNTER — Ambulatory Visit (HOSPITAL_COMMUNITY)
Admission: RE | Admit: 2013-01-30 | Discharge: 2013-01-30 | Disposition: A | Discharge status: Home / Self Care | Payer: Self-pay | Source: Ambulatory Visit | Attending: Nurse Practitioner | Admitting: Nurse Practitioner

## 2013-01-30 ENCOUNTER — Other Ambulatory Visit (HOSPITAL_COMMUNITY): Payer: Self-pay

## 2013-01-30 DIAGNOSIS — N6310 Unspecified lump in the right breast, unspecified quadrant: Secondary | ICD-10-CM

## 2013-02-06 ENCOUNTER — Other Ambulatory Visit (HOSPITAL_COMMUNITY): Payer: Self-pay | Admitting: Nurse Practitioner

## 2013-02-06 ENCOUNTER — Ambulatory Visit (HOSPITAL_COMMUNITY)
Admission: RE | Admit: 2013-02-06 | Discharge: 2013-02-06 | Disposition: A | Discharge status: Home / Self Care | Payer: PRIVATE HEALTH INSURANCE | Source: Ambulatory Visit | Attending: Nurse Practitioner | Admitting: Nurse Practitioner

## 2013-02-06 DIAGNOSIS — N6310 Unspecified lump in the right breast, unspecified quadrant: Secondary | ICD-10-CM

## 2013-02-06 DIAGNOSIS — N63 Unspecified lump in unspecified breast: Secondary | ICD-10-CM | POA: Insufficient documentation

## 2013-02-06 DIAGNOSIS — R599 Enlarged lymph nodes, unspecified: Secondary | ICD-10-CM | POA: Insufficient documentation

## 2013-02-06 MED ORDER — LIDOCAINE HCL (PF) 2 % IJ SOLN
10.0000 mL | Freq: Once | INTRAMUSCULAR | Status: AC
  Administered 2013-02-06: 10 mL

## 2013-02-06 MED ORDER — LIDOCAINE HCL (PF) 2 % IJ SOLN
INTRAMUSCULAR | Status: AC
  Administered 2013-02-06: 10 mL
  Filled 2013-02-06: qty 10

## 2013-02-13 ENCOUNTER — Other Ambulatory Visit (HOSPITAL_COMMUNITY): Payer: Self-pay | Admitting: Family Medicine

## 2013-02-13 ENCOUNTER — Ambulatory Visit (HOSPITAL_COMMUNITY)
Admission: RE | Admit: 2013-02-13 | Discharge: 2013-02-13 | Disposition: A | Discharge status: Home / Self Care | Payer: PRIVATE HEALTH INSURANCE | Source: Ambulatory Visit | Attending: Family Medicine | Admitting: Family Medicine

## 2013-02-13 DIAGNOSIS — M25579 Pain in unspecified ankle and joints of unspecified foot: Secondary | ICD-10-CM | POA: Insufficient documentation

## 2013-02-13 DIAGNOSIS — M2010 Hallux valgus (acquired), unspecified foot: Secondary | ICD-10-CM | POA: Insufficient documentation

## 2013-02-13 DIAGNOSIS — G8929 Other chronic pain: Secondary | ICD-10-CM

## 2013-02-13 DIAGNOSIS — M79609 Pain in unspecified limb: Secondary | ICD-10-CM | POA: Insufficient documentation

## 2013-02-23 ENCOUNTER — Emergency Department (HOSPITAL_COMMUNITY)
Admission: EM | Admit: 2013-02-23 | Discharge: 2013-02-23 | Disposition: A | Discharge status: Home / Self Care | Payer: PRIVATE HEALTH INSURANCE | Attending: Emergency Medicine | Admitting: Emergency Medicine

## 2013-02-23 ENCOUNTER — Encounter (HOSPITAL_COMMUNITY): Payer: Self-pay | Admitting: Emergency Medicine

## 2013-02-23 ENCOUNTER — Emergency Department (HOSPITAL_COMMUNITY): Payer: PRIVATE HEALTH INSURANCE

## 2013-02-23 DIAGNOSIS — R269 Unspecified abnormalities of gait and mobility: Secondary | ICD-10-CM | POA: Insufficient documentation

## 2013-02-23 DIAGNOSIS — I69998 Other sequelae following unspecified cerebrovascular disease: Secondary | ICD-10-CM | POA: Insufficient documentation

## 2013-02-23 DIAGNOSIS — M6281 Muscle weakness (generalized): Secondary | ICD-10-CM | POA: Insufficient documentation

## 2013-02-23 DIAGNOSIS — Z79899 Other long term (current) drug therapy: Secondary | ICD-10-CM | POA: Insufficient documentation

## 2013-02-23 DIAGNOSIS — I10 Essential (primary) hypertension: Secondary | ICD-10-CM | POA: Insufficient documentation

## 2013-02-23 DIAGNOSIS — E7800 Pure hypercholesterolemia, unspecified: Secondary | ICD-10-CM | POA: Insufficient documentation

## 2013-02-23 DIAGNOSIS — M7989 Other specified soft tissue disorders: Secondary | ICD-10-CM | POA: Insufficient documentation

## 2013-02-23 DIAGNOSIS — F17200 Nicotine dependence, unspecified, uncomplicated: Secondary | ICD-10-CM | POA: Insufficient documentation

## 2013-02-23 DIAGNOSIS — R209 Unspecified disturbances of skin sensation: Secondary | ICD-10-CM | POA: Insufficient documentation

## 2013-02-23 MED ORDER — ENOXAPARIN SODIUM 120 MG/0.8ML ~~LOC~~ SOLN
1.0000 mg/kg | Freq: Once | SUBCUTANEOUS | Status: AC
  Administered 2013-02-23: 120 mg via SUBCUTANEOUS
  Filled 2013-02-23: qty 0.8

## 2013-02-23 MED ORDER — TRAMADOL HCL 50 MG PO TABS: 50.0000 mg | ORAL_TABLET | Freq: Four times a day (QID) | ORAL | Status: DC | PRN

## 2013-02-23 NOTE — ED Notes (Addendum)
Pt c/o swelling to left hand, left arm and left knee; denies any injury, c/o pain to left wrist, denies SOB

## 2013-02-23 NOTE — ED Notes (Signed)
Patient given a drink and peanut butter crackers per RN approval.

## 2013-02-23 NOTE — ED Notes (Signed)
Pt with hx of CVA and left sided is her affected side; [facility]

## 2013-02-23 NOTE — ED Provider Notes (Signed)
CSN: [id number]     Arrival date & time 02/23/13  1152 History  This chart was scribed for Benny Lennert, MD,  by Ashley Jacobs, ED Scribe. The patient was seen in room APA04/APA04 and the patient's care was started at 12:22 PM.  First MD Initiated Contact with Patient 02/23/13 1220     Chief Complaint  Patient presents with  . Leg Swelling   (Consider location/radiation/quality/duration/timing/severity/associated sxs/prior Treatment) Patient is a 47 y.o. female presenting with extremity weakness. The history is provided by the patient and medical records (the pt complains of swelling to her left arm.  she has weakness on the left side from a stroke). No language interpreter was used.  Extremity Weakness This is a chronic problem. The current episode started more than 1 week ago. The problem occurs constantly. The problem has not changed since onset.Pertinent negatives include no chest pain and no abdominal pain. Nothing aggravates the symptoms. Nothing relieves the symptoms.   HPI Comments: [name] is a 47 y.o. female who had a previous stroke due to HTN presents to the Emergency Department complaining of moderate left leg swelling. Pt states experiencing pain and swelling to her left knee and left wrist pain that radiates to her forearm. She reports having left sided numbness and weakness to her left arm and leg but she explains this is baseline. The pain is worse with movement and flexion/extension. Pt states she is unable to move with out pain and walking makes the knee pain worse. She does not have any known allergies to medications. Pt has a previous medical hx of stroke, HTN and high cholesterol. Pt smokes tobacco and drinks alcohol.  Past Medical History  Diagnosis Date  . Stroke   . Hypertension   . High cholesterol    Past Surgical History  Procedure Laterality Date  . Dilation and curettage of uterus     History reviewed. No pertinent family history. History   Substance Use Topics  . Smoking status: Current Every Day Smoker    Types: Cigarettes  . Smokeless tobacco: Not on file  . Alcohol Use: Yes     Comment: occasional   OB History   Grav Para Term Preterm Abortions TAB SAB Ect Mult Living                 Review of Systems  Cardiovascular: Positive for leg swelling. Negative for chest pain.  Gastrointestinal: Negative for abdominal pain.  Musculoskeletal: Positive for arthralgias, extremity weakness, gait problem and myalgias.  Neurological: Positive for weakness and numbness.  All other systems reviewed and are negative.    Allergies  Review of patient's allergies indicates no known allergies.  Home Medications   Current Outpatient Rx  Name  Route  Sig  Dispense  Refill  . carvedilol (COREG) 3.125 MG tablet   Oral   Take 3.125 mg by mouth 2 (two) times daily with a meal.         . PRESCRIPTION MEDICATION   Oral   Take 1 capsule by mouth daily. Depression medication. **unsure of name**         . simvastatin (ZOCOR) 40 MG tablet   Oral   Take 40 mg by mouth every evening.          BP 112/47  Pulse 70  Temp(Src) 98.4 F (36.9 C) (Oral)  Resp 20  Ht 5\' 6"  (1.676 m)  Wt 265 lb (120.203 kg)  BMI 42.79 kg/m2  SpO2 99%  LMP  07/29/2012 Physical Exam  Nursing note and vitals reviewed. Constitutional: She appears well-developed and well-nourished. No distress.  HENT:  Head: Normocephalic and atraumatic.  Right Ear: External ear normal.  Left Ear: External ear normal.  Eyes: Conjunctivae are normal. Right eye exhibits no discharge. Left eye exhibits no discharge. No scleral icterus.  Neck: Neck supple. No tracheal deviation present.  Cardiovascular: Normal rate, regular rhythm and intact distal pulses.   Pulmonary/Chest: Effort normal and breath sounds normal. No stridor. No respiratory distress. She has no wheezes. She has no rales.  Abdominal: Soft. Bowel sounds are normal. She exhibits no distension. There is  no tenderness. There is no rebound and no guarding.  Musculoskeletal: She exhibits edema and tenderness.  Profound weakness in left arm and leg Swelling of the left arm and leg Decreased ROM of wrist and elbow Minor swelling and tenderness of left knee   Neurological: She is alert. She has normal strength. No sensory deficit. Cranial nerve deficit:  no gross defecits noted. She exhibits normal muscle tone. She displays no seizure activity. Coordination normal.  Skin: Skin is warm and dry. No rash noted.  Psychiatric: She has a normal mood and affect.    ED Course  Procedures (including critical care time) DIAGNOSTIC STUDIES: Oxygen Saturation is 99% on room air, normal by my interpretation.    COORDINATION OF CARE: 12:25 PM Discussed course of care with pt. Pt understands and agrees.  Labs Review Labs Reviewed - No data to display Imaging Review No results found.  EKG Interpretation   None       MDM  Swelling left forearm,  Will get [place] in the am,  If neg pt can follow up with pcp this week  Benny Lennert, MD 02/23/13 1454

## 2013-02-24 ENCOUNTER — Ambulatory Visit (HOSPITAL_COMMUNITY)
Admission: RE | Admit: 2013-02-24 | Discharge: 2013-02-24 | Disposition: A | Discharge status: Home / Self Care | Payer: PRIVATE HEALTH INSURANCE | Source: Ambulatory Visit | Attending: Emergency Medicine | Admitting: Emergency Medicine

## 2013-02-24 DIAGNOSIS — M7989 Other specified soft tissue disorders: Secondary | ICD-10-CM | POA: Insufficient documentation

## 2013-06-25 ENCOUNTER — Encounter (HOSPITAL_COMMUNITY): Payer: Self-pay | Admitting: Emergency Medicine

## 2013-06-25 ENCOUNTER — Emergency Department (HOSPITAL_COMMUNITY): Payer: Self-pay

## 2013-06-25 ENCOUNTER — Inpatient Hospital Stay (HOSPITAL_COMMUNITY)
Admission: EM | Admit: 2013-06-25 | Discharge: 2013-06-28 | DRG: 101 | Disposition: A | Discharge status: Home Health | Payer: Self-pay | Attending: Family Medicine | Admitting: Family Medicine

## 2013-06-25 DIAGNOSIS — I639 Cerebral infarction, unspecified: Secondary | ICD-10-CM | POA: Diagnosis present

## 2013-06-25 DIAGNOSIS — I69959 Hemiplegia and hemiparesis following unspecified cerebrovascular disease affecting unspecified side: Secondary | ICD-10-CM

## 2013-06-25 DIAGNOSIS — G8190 Hemiplegia, unspecified affecting unspecified side: Secondary | ICD-10-CM

## 2013-06-25 DIAGNOSIS — Z5989 Other problems related to housing and economic circumstances: Secondary | ICD-10-CM

## 2013-06-25 DIAGNOSIS — E782 Mixed hyperlipidemia: Secondary | ICD-10-CM | POA: Diagnosis present

## 2013-06-25 DIAGNOSIS — Z6841 Body Mass Index (BMI) 40.0 and over, adult: Secondary | ICD-10-CM

## 2013-06-25 DIAGNOSIS — G40109 Localization-related (focal) (partial) symptomatic epilepsy and epileptic syndromes with simple partial seizures, not intractable, without status epilepticus: Principal | ICD-10-CM | POA: Diagnosis present

## 2013-06-25 DIAGNOSIS — G8194 Hemiplegia, unspecified affecting left nondominant side: Secondary | ICD-10-CM

## 2013-06-25 DIAGNOSIS — Z598 Other problems related to housing and economic circumstances: Secondary | ICD-10-CM

## 2013-06-25 DIAGNOSIS — I6350 Cerebral infarction due to unspecified occlusion or stenosis of unspecified cerebral artery: Secondary | ICD-10-CM

## 2013-06-25 DIAGNOSIS — E7800 Pure hypercholesterolemia, unspecified: Secondary | ICD-10-CM | POA: Diagnosis present

## 2013-06-25 DIAGNOSIS — I10 Essential (primary) hypertension: Secondary | ICD-10-CM | POA: Diagnosis present

## 2013-06-25 DIAGNOSIS — E785 Hyperlipidemia, unspecified: Secondary | ICD-10-CM | POA: Diagnosis present

## 2013-06-25 DIAGNOSIS — I6529 Occlusion and stenosis of unspecified carotid artery: Secondary | ICD-10-CM | POA: Diagnosis present

## 2013-06-25 DIAGNOSIS — F17200 Nicotine dependence, unspecified, uncomplicated: Secondary | ICD-10-CM | POA: Diagnosis present

## 2013-06-25 DIAGNOSIS — Z5987 Material hardship due to limited financial resources, not elsewhere classified: Secondary | ICD-10-CM

## 2013-06-25 DIAGNOSIS — Z800 Family history of malignant neoplasm of digestive organs: Secondary | ICD-10-CM

## 2013-06-25 DIAGNOSIS — Z9119 Patient's noncompliance with other medical treatment and regimen: Secondary | ICD-10-CM

## 2013-06-25 DIAGNOSIS — Z8249 Family history of ischemic heart disease and other diseases of the circulatory system: Secondary | ICD-10-CM

## 2013-06-25 DIAGNOSIS — Z91199 Patient's noncompliance with other medical treatment and regimen due to unspecified reason: Secondary | ICD-10-CM

## 2013-06-25 DIAGNOSIS — R251 Tremor, unspecified: Secondary | ICD-10-CM | POA: Diagnosis present

## 2013-06-25 DIAGNOSIS — E669 Obesity, unspecified: Secondary | ICD-10-CM | POA: Diagnosis present

## 2013-06-25 DIAGNOSIS — G4700 Insomnia, unspecified: Secondary | ICD-10-CM | POA: Diagnosis present

## 2013-06-25 DIAGNOSIS — I6930 Unspecified sequelae of cerebral infarction: Secondary | ICD-10-CM

## 2013-06-25 LAB — BASIC METABOLIC PANEL
BUN: 9 mg/dL (ref 6–23)
CO2: 27 mEq/L (ref 19–32)
Calcium: 9.2 mg/dL (ref 8.4–10.5)
Chloride: 103 mEq/L (ref 96–112)
Creatinine, Ser: 0.7 mg/dL (ref 0.50–1.10)
GFR calc Af Amer: >90 mL/min (ref >90)
GFR calc non Af Amer: >90 mL/min (ref >90)
Glucose, Bld: 91 mg/dL (ref 70–99)
Potassium: 4 mEq/L (ref 3.7–5.3)
Sodium: 142 mEq/L (ref 137–147)

## 2013-06-25 LAB — CBC WITH DIFFERENTIAL/PLATELET
Basophils Absolute: 0 10*3/uL (ref 0.0–0.1)
Basophils Relative: 0 % (ref 0–1)
Eosinophils Absolute: 0.1 10*3/uL (ref 0.0–0.7)
Eosinophils Relative: 1 % (ref 0–5)
HCT: 37.8 % (ref 36.0–46.0)
Hemoglobin: 12 g/dL (ref 12.0–15.0)
Lymphocytes Relative: 29 % (ref 12–46)
Lymphs Abs: 2 10*3/uL (ref 0.7–4.0)
MCH: 27.9 pg (ref 26.0–34.0)
MCHC: 31.7 g/dL (ref 30.0–36.0)
MCV: 87.9 fL (ref 78.0–100.0)
Monocytes Absolute: 0.4 10*3/uL (ref 0.1–1.0)
Monocytes Relative: 5 % (ref 3–12)
Neutro Abs: 4.5 10*3/uL (ref 1.7–7.7)
Neutrophils Relative %: 65 % (ref 43–77)
Platelets: 380 10*3/uL (ref 150–400)
RBC: 4.3 MIL/uL (ref 3.87–5.11)
RDW: 14.4 % (ref 11.5–15.5)
WBC: 7 10*3/uL (ref 4.0–10.5)

## 2013-06-25 MED ORDER — SODIUM CHLORIDE 0.9 % IV SOLN
500.0000 mg | Freq: Two times a day (BID) | INTRAVENOUS | Status: DC
  Filled 2013-06-25 (×3): qty 5

## 2013-06-25 MED ORDER — SODIUM CHLORIDE 0.9 % IV SOLN
1000.0000 mg | Freq: Once | INTRAVENOUS | Status: AC
  Administered 2013-06-26: 1000 mg via INTRAVENOUS
  Filled 2013-06-25: qty 10

## 2013-06-25 MED ORDER — ASPIRIN EC 325 MG PO TBEC
325.0000 mg | DELAYED_RELEASE_TABLET | Freq: Every day | ORAL | Status: DC
  Administered 2013-06-26 – 2013-06-28 (×3): 325 mg via ORAL
  Filled 2013-06-25 (×3): qty 1

## 2013-06-25 MED ORDER — LORAZEPAM 2 MG/ML IJ SOLN: 1.0000 mg | INTRAMUSCULAR | Status: DC | PRN

## 2013-06-25 MED ORDER — FENTANYL CITRATE 0.05 MG/ML IJ SOLN
12.5000 ug | INTRAMUSCULAR | Status: DC | PRN
  Administered 2013-06-26: 12.5 ug via INTRAVENOUS
  Filled 2013-06-25: qty 2

## 2013-06-25 MED ORDER — HYDROMORPHONE HCL PF 1 MG/ML IJ SOLN: 0.5000 mg | INTRAMUSCULAR | Status: DC | PRN

## 2013-06-25 MED ORDER — MORPHINE SULFATE 4 MG/ML IJ SOLN
4.0000 mg | Freq: Once | INTRAMUSCULAR | Status: AC
  Administered 2013-06-25: 4 mg via INTRAVENOUS
  Filled 2013-06-25: qty 1

## 2013-06-25 MED ORDER — SENNOSIDES-DOCUSATE SODIUM 8.6-50 MG PO TABS: 1.0000 | ORAL_TABLET | Freq: Every evening | ORAL | Status: DC | PRN

## 2013-06-25 MED ORDER — ENOXAPARIN SODIUM 40 MG/0.4ML ~~LOC~~ SOLN
40.0000 mg | SUBCUTANEOUS | Status: DC
  Administered 2013-06-26 (×2): 40 mg via SUBCUTANEOUS
  Filled 2013-06-25 (×2): qty 0.4

## 2013-06-25 MED ORDER — DIAZEPAM 5 MG/ML IJ SOLN
5.0000 mg | Freq: Once | INTRAMUSCULAR | Status: AC
  Administered 2013-06-25: 5 mg via INTRAVENOUS
  Filled 2013-06-25: qty 2

## 2013-06-25 MED ORDER — ASPIRIN 81 MG PO CHEW
324.0000 mg | CHEWABLE_TABLET | Freq: Once | ORAL | Status: AC
  Administered 2013-06-25: 324 mg via ORAL
  Filled 2013-06-25: qty 4

## 2013-06-25 MED ORDER — DIPHENHYDRAMINE HCL 50 MG/ML IJ SOLN
25.0000 mg | Freq: Once | INTRAMUSCULAR | Status: AC
  Administered 2013-06-25: 25 mg via INTRAVENOUS
  Filled 2013-06-25: qty 1

## 2013-06-25 MED ORDER — LEVETIRACETAM 500 MG/5ML IV SOLN
INTRAVENOUS | Status: AC
  Filled 2013-06-25: qty 10

## 2013-06-25 NOTE — ED Provider Notes (Signed)
CSN: [id number]     Arrival date & time 06/25/13  1647 History   First MD Initiated Contact with Patient 06/25/13 1738     Chief Complaint  Patient presents with  . Headache     (Consider location/radiation/quality/duration/timing/severity/associated sxs/prior Treatment) HPI  48 year old female with headache and jerking of her left extremities. Her past history is pertinent for a CVA in January of 2013. As part of this workup she had a CTA which showed complete occlusion of the right common carotid artery and not a candidate for endarterectomy. Neurology was recommending aspirin and Plavix at that time. Patient was lost to followup and has not been taking any medications at all secondary to financial constraints. Her symptoms have been relatively stable with regards to her prior stroke until today. At baseline she ambulates with a cane.  Today she woke up with a headache and involuntary jerking of her left arm and left leg. She reports her left-sided weakness may be a little bit worse than her baseline but has been able to still ambulate with a cane. Her headache is diffuse, but worse in the frontal region. She denies any trauma. She was in her usual state of health when she went to bed last night. No acute visual changes. No change in her speech. Denies any drug use. Is a smoker, although she states that she has cut back considerably recently. No neck pain or neck stiffness. No fevers or chills. No history of seizure disorder.  Past Medical History  Diagnosis Date  . Stroke   . Hypertension   . High cholesterol    Past Surgical History  Procedure Laterality Date  . Dilation and curettage of uterus     History reviewed. No pertinent family history. History  Substance Use Topics  . Smoking status: Light Tobacco Smoker    Types: Cigarettes  . Smokeless tobacco: Not on file  . Alcohol Use: Yes     Comment: occasional   OB History   Grav Para Term Preterm Abortions TAB SAB Ect Mult  Living                 Review of Systems  All systems reviewed and negative, other than as noted in HPI.   Allergies  Review of patient's allergies indicates no known allergies.  Home Medications  No current outpatient prescriptions on file. BP 125/69  Temp(Src) 97.8 F (36.6 C) (Oral)  Resp 18  Ht 5\' 6"  (1.676 m)  Wt 260 lb (117.935 kg)  BMI 41.99 kg/m2  SpO2 98%  LMP 07/29/2012 Physical Exam  Nursing note and vitals reviewed. Constitutional: She is oriented to person, place, and time. She appears well-developed and well-nourished. No distress.  HENT:  Head: Normocephalic and atraumatic.  Eyes: Conjunctivae are normal. Right eye exhibits no discharge. Left eye exhibits no discharge.  Neck: Neck supple.  Cardiovascular: Normal rate, regular rhythm and normal heart sounds.  Exam reveals no gallop and no friction rub.   No murmur heard. Pulmonary/Chest: Effort normal and breath sounds normal. No respiratory distress.  Abdominal: Soft. She exhibits no distension. There is no tenderness.  Musculoskeletal: She exhibits no edema and no tenderness.  Neurological: She is alert and oriented to person, place, and time.  Speech is clear. Content is appropriate. Cranial nerves are intact. Barely able to overcome gravity with left upper extremity. Grip strength is markedly diminished as compared to the right. Unable to lift her leg off the bed. Occasional myoclonic jerking of both the left  upper and left lower extremity, more pronounced in the left upper extremity. No right-sided deficits noted. Good finger to nose testing with you right upper extremity. Unable to assess the left secondary to weakness.  Skin: Skin is warm and dry.  Psychiatric: She has a normal mood and affect. Her behavior is normal. Thought content normal.    ED Course  Procedures (including critical care time) Labs Review Labs Reviewed - No data to display Imaging Review Ct Head Wo Contrast  06/25/2013   CLINICAL  DATA:  Headache. History of stroke with left-sided paralysis.  EXAM: CT HEAD WITHOUT CONTRAST  TECHNIQUE: Contiguous axial images were obtained from the base of the skull through the vertex without intravenous contrast.  COMPARISON:  04/07/2011  FINDINGS: Images are degraded by motion. There is new hypoattenuation involving cortex and white matter and the predominantly medial right frontal lobe in the general distribution of the ACA. Slightly more extensive confluent hypoattenuation is present near the vertex. There is new, mild ex vacuo dilatation of the right lateral ventricle. There is no midline shift. No acute intracranial hemorrhage is identified. There is no midline shift or extra-axial fluid collection. Orbits are grossly unremarkable. Visualized mastoid air cells and paranasal sinuses are clear.  IMPRESSION: New right frontal lobe hypoattenuation compatible with ACA infarct, possibly subacute.  These results were called by telephone at the time of interpretation on 06/25/2013 at 7:14 PM to Dr. Manus Gunning, who verbally acknowledged these results.   Electronically Signed   By: Sebastian Ache   On: 06/25/2013 19:14   Mr Brain Wo Contrast  06/25/2013   CLINICAL DATA:  Altered mental status. Weakness. Hypertension. Hypercholesterolemia. Prior stroke.  EXAM: MRI HEAD WITHOUT CONTRAST  TECHNIQUE: Multiplanar, multiecho pulse sequences of the brain and surrounding structures were obtained without intravenous contrast.  COMPARISON:  06/25/2013 CT and 04/07/2011 MR.  FINDINGS: Exam is motion degraded.  Remote large right hemispheric infarct with encephalomalacia right frontal lobe, right parietal lobe and posterior right temporal lobe with subsequent dilation of the right lateral ventricle.  Acute infarct posterior right temporal -parietal lobe consistent with extension of previous infarct.  No obvious intracranial hemorrhage although motion degradation limits evaluation.  No obvious intracranial mass.  Chronic  occlusion of the right internal carotid artery.  Soft tissue prominence posterior superior nasopharynx less bulbous than on the prior exam.  IMPRESSION: Chronically occluded right internal carotid artery and remote large right hemispheric infarct. Findings consistent with extension of this infarct now with acute infarction posterior right temporal lobe-parietal lobe. This suggests inadequate collateral flow.  No obvious hemorrhage noted on this motion degraded exam.  These results were called by telephone at the time of interpretation on 06/25/2013 at 9:26 PM to Dr. Raeford Razor , who verbally acknowledged these results.   Electronically Signed   By: Bridgett Larsson M.D.   On: 06/25/2013 21:28     EKG Interpretation   Date/Time:  Tuesday June 25 2013 19:35:16 EDT Ventricular Rate:  80 PR Interval:  180 QRS Duration: 88 QT Interval:  392 QTC Calculation: 452 R Axis:   30 Text Interpretation:  Normal sinus rhythm Normal ECG When compared with  ECG of 07-Apr-2011 18:48, No significant change was found Confirmed by  Juleen China  MD, Dharma Pare ([id number]) on 06/25/2013 10:28:37 PM      MDM   Final diagnoses:  Acute CVA (cerebrovascular accident)    48 year old female with known complete right common carotid occlusion and baseline left-sided weakness now with headache and myoclonic jerking  of the left-sided extremities. Imaging showing evidence of prior infarct with an acute component. Patient has been noncompliant with her antiplatelet agents secondary to financial issues. Smoking history, although she states that she has not smoked "recently." Discussed case with neurology, Dr [name]. No acute intervention. At this time, recommend starting on antiplatelet agents, possibly reassessing collateral vasculature and EEG. Will discuss with medicine for admission.     Raeford Razor, MD 06/28/13 1622

## 2013-06-25 NOTE — H&P (Signed)
[facility] History and Physical  MICAL LOUCH DCV:[id number] DOB: Sep 27, 1965 DOA: 06/25/2013  Referring physician: EDP PCP: Isabella Stalling, MD Specialists:   Chief Complaint: Headache and Twitching of Left Arm  HPI: [name] is a 48 y.o. 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   EMS was called and administered 2 mg of IV Ativan and  She was evaluated when she arrived at the ED and had a CT scan and MRI of the brain which revealed Extension of the infarct of the posterior Right temporal Lobe -Parietal Lobe, and an Occluded Right ICA.   Patient is currently on no medications, and has not been seen by her PCP Dr Janna Arch in several years.  She reports not being able to afford the Plavix therapy and also had not taken the Aspirin therapy that had been prescribed for her previous CVA.      Review of Systems:  Constitutional: No Weight Loss, No Weight Gain, Night Sweats, Fevers, Chills, Fatigue, or Generalized Weakness HEENT: +Headaches, Difficulty Swallowing,Tooth/Dental Problems,Sore Throat,  No Sneezing, Rhinitis, Ear Ache, Nasal Congestion, or Post Nasal Drip,  Cardio-vascular:  No Chest pain, Orthopnea, PND, Edema in lower extremities, Anasarca, Dizziness, Palpitations  Resp: No Dyspnea, No DOE, No Productive Cough, No Non-Productive Cough, No Hemoptysis, No Change in Color of Mucus,  No Wheezing.    GI: No Heartburn, Indigestion, Abdominal Pain, Nausea, Vomiting, Diarrhea, Change in Bowel Habits,  Loss of Appetite  GU: No Dysuria, Change in Color of Urine, No Urgency or Frequency.  No flank pain.  Musculoskeletal: No Joint Pain or Swelling.  No Back Pain.  Neurologic: No Syncope, No Seizures, + Left Sided Weakness, Paresthesia, Vision Disturbance or Loss, No Diplopia, No Vertigo,  +Difficulty Walking,  Skin: No Rash or Lesions. Psych: No Change in Mood or Affect. No Depression or Anxiety. No Memory loss. No Confusion or Hallucinations   Past Medical History  Diagnosis Date  . Stroke   . Hypertension   . High cholesterol       Past Surgical History  Procedure Laterality Date  . Dilation and curettage of uterus         Prior to Admission medications   Not on File      No Known Allergies   Social History:  reports that she has been smoking Cigarettes.  She has been smoking about 0.00 packs per day. She does not have any smokeless tobacco history on file. She reports that she drinks alcohol. She reports that she does not use illicit drugs.     Family History:     Mother - GI Cancer  Father- CAD    Physical Exam:  GEN:  Pleasant Obese  48 y.o. African American female  examined  and in no acute distress; cooperative with exam Filed Vitals:   06/25/13 1939 06/25/13 1945 06/25/13 2000 06/25/13 2200  BP: 109/75  130/95   Pulse: 78 81  87  Temp:      TempSrc:      Resp: 20 25 24 23   Height:      Weight:      SpO2: 100% 100%  97%   Blood pressure 130/95, pulse 87, temperature 97.8 F (36.6 C), temperature source Oral, resp. rate 23, height 5\' 6"  (1.676 m), weight 117.935 kg (260  lb), last menstrual period 07/29/2012, SpO2 97.00%. PSYCH: She is alert and oriented x4; does not appear anxious does not appear depressed; affect is normal HEENT: Normocephalic and Atraumatic, Mucous membranes pink; PERRLA; EOM intact; Fundi:  Benign;  No scleral icterus, Nares: Patent, Oropharynx: Clear, Fair Dentition, Neck:  FROM, no cervical lymphadenopathy nor thyromegaly or carotid bruit; no JVD; Breasts:: Not examined CHEST WALL: No tenderness CHEST: Normal respiration, clear to auscultation bilaterally HEART: Regular rate and rhythm; no murmurs rubs or gallops BACK: No kyphosis or scoliosis; no CVA tenderness ABDOMEN: Positive Bowel Sounds, Obese, soft  non-tender; no masses, no organomegaly, no pannus; no intertriginous candida. Rectal Exam: Not done EXTREMITIES: No cyanosis, clubbing or edema; no ulcerations. Genitalia: not examined PULSES: 2+ and symmetric SKIN: Normal hydration no rash or ulceration CNS:  Alert and Oriented x 4, Speech Mildly Dysarthric,  +Left UE Hemiparesis with Strength 2/5, and contracted Left hand and elbow.   LLE:  Strength 3/5.  Gait:Deferred  Babinski: +Left foot Vascular: pulses palpable throughout    Labs on Admission:  Basic Metabolic Panel:  Recent Labs Lab 06/25/13 1952  NA 142  K 4.0  CL 103  CO2 27  GLUCOSE 91  BUN 9  CREATININE 0.70  CALCIUM 9.2   Liver Function Tests: No results found for this basename: AST, ALT, ALKPHOS, BILITOT, PROT, ALBUMIN,  in the last 168 hours No results found for this basename: LIPASE, AMYLASE,  in the last 168 hours No results found for this basename: AMMONIA,  in the last 168 hours CBC:  Recent Labs Lab 06/25/13 1952  WBC 7.0  NEUTROABS 4.5  HGB 12.0  HCT 37.8  MCV 87.9  PLT 380   Cardiac Enzymes: No results found for this basename: CKTOTAL, CKMB, CKMBINDEX, TROPONINI,  in the last 168 hours  BNP (last 3 results) No results found for this basename: PROBNP,  in the last 8760 hours CBG: No results found for this basename: GLUCAP,  in the last 168 hours  Radiological Exams on Admission: Ct Head Wo Contrast  06/25/2013   CLINICAL DATA:  Headache. History of stroke with left-sided paralysis.  EXAM: CT HEAD WITHOUT CONTRAST  TECHNIQUE: Contiguous axial images were obtained from the base of the skull through the vertex without intravenous contrast.  COMPARISON:  04/07/2011  FINDINGS: Images are degraded by motion. There is new hypoattenuation involving cortex and white matter and the predominantly medial right frontal lobe in the general distribution of the ACA. Slightly more extensive confluent hypoattenuation is present near the vertex. There is new, mild  ex vacuo dilatation of the right lateral ventricle. There is no midline shift. No acute intracranial hemorrhage is identified. There is no midline shift or extra-axial fluid collection. Orbits are grossly unremarkable. Visualized mastoid air cells and paranasal sinuses are clear.  IMPRESSION: New right frontal lobe hypoattenuation compatible with ACA infarct, possibly subacute.  These results were called by telephone at the time of interpretation on 06/25/2013 at 7:14 PM to Dr. Manus Gunning, who verbally acknowledged these results.   Electronically Signed   By: Sebastian Ache   On: 06/25/2013 19:14   Mr Brain Wo Contrast  06/25/2013   CLINICAL DATA:  Altered mental status. Weakness. Hypertension. Hypercholesterolemia. Prior stroke.  EXAM: MRI HEAD WITHOUT CONTRAST  TECHNIQUE: Multiplanar, multiecho pulse sequences of the brain and surrounding structures were obtained without intravenous contrast.  COMPARISON:  06/25/2013 CT and 04/07/2011 MR.  FINDINGS: Exam is motion degraded.  Remote large right hemispheric infarct with encephalomalacia right  frontal lobe, right parietal lobe and posterior right temporal lobe with subsequent dilation of the right lateral ventricle.  Acute infarct posterior right temporal -parietal lobe consistent with extension of previous infarct.  No obvious intracranial hemorrhage although motion degradation limits evaluation.  No obvious intracranial mass.  Chronic occlusion of the right internal carotid artery.  Soft tissue prominence posterior superior nasopharynx less bulbous than on the prior exam.  IMPRESSION: Chronically occluded right internal carotid artery and remote large right hemispheric infarct. Findings consistent with extension of this infarct now with acute infarction posterior right temporal lobe-parietal lobe. This suggests inadequate collateral flow.  No obvious hemorrhage noted on this motion degraded exam.  These results were called by telephone at the time of interpretation on  06/25/2013 at 9:26 PM to Dr. Raeford Razor , who verbally acknowledged these results.   Electronically Signed   By: Bridgett Larsson M.D.   On: 06/25/2013 21:28       Assessment/Plan:   48 y.o. female with  Principal Problem:   Acute CVA (cerebrovascular accident) Active Problems:   Carotid artery occlusion   HTN (hypertension)   Tremor of left hand   Hemiparesis, left   High cholesterol      1.   Acute CVA-  On CT scan and MRI, Known Occluded Right ICA, now with further extension,   Initiate Ischemic CVA protocol, ASA Rx,  Eval by PT,OT and speech.  Telemetry Monitoring, Neurology Dr. [name] to see in AM.     2.   Tremors vs Focal Seizure-  IV Keppra Load of 1000 mg X1 ordered then 500 mg IV q 12 hours.  EEG ordered for AM.  PRN IV Ativan.     3.   Hyperlipidemia- check Liipds, initiate Statin RX.     4.   Left Hemiparesis - chronic from  Previous CVA  5.   Other - social work consult due to issues of affordability of medications and health care.     6.  DVT prophylaxis with Lovenox.       Code Status:   FULL CODE Family Communication:    No Family present Disposition Plan:     Inpatient    Time spent:  15 Minutes  Ron Parker [facility] Pager [phone]  If 7PM-7AM, please contact night-coverage [email or web address] Password [facility] 06/25/2013, 11:03 PM

## 2013-06-25 NOTE — ED Notes (Addendum)
Reported that pt with HA at 100 and left sided twitching, 2mg  Ativan given en route due to twitching, mild twitching noted at arrival, hx of CVA with left sided paralysis

## 2013-06-26 ENCOUNTER — Inpatient Hospital Stay (HOSPITAL_COMMUNITY): Payer: Self-pay

## 2013-06-26 ENCOUNTER — Other Ambulatory Visit (HOSPITAL_COMMUNITY): Payer: PRIVATE HEALTH INSURANCE

## 2013-06-26 DIAGNOSIS — I517 Cardiomegaly: Secondary | ICD-10-CM

## 2013-06-26 LAB — LIPID PANEL
CHOL/HDL RATIO: 4.6 ratio
CHOLESTEROL: 224 mg/dL — AB (ref 0–200)
HDL: 49 mg/dL (ref >39)
LDL Cholesterol: 149 mg/dL — ABNORMAL HIGH (ref 0–99)
TRIGLYCERIDES: 129 mg/dL (ref <150)
VLDL: 26 mg/dL (ref 0–40)

## 2013-06-26 LAB — GLUCOSE, CAPILLARY
GLUCOSE-CAPILLARY: 94 mg/dL (ref 70–99)
GLUCOSE-CAPILLARY: 76 mg/dL (ref 70–99)
Glucose-Capillary: 89 mg/dL (ref 70–99)
Glucose-Capillary: 98 mg/dL (ref 70–99)

## 2013-06-26 LAB — HEMOGLOBIN A1C
Hgb A1c MFr Bld: 5.2 % (ref <5.7)
Mean Plasma Glucose: 103 mg/dL (ref <117)

## 2013-06-26 MED ORDER — SODIUM CHLORIDE 0.9 % IV SOLN
1000.0000 mg | Freq: Two times a day (BID) | INTRAVENOUS | Status: DC
  Administered 2013-06-26 – 2013-06-27 (×4): 1000 mg via INTRAVENOUS
  Filled 2013-06-26 (×5): qty 10

## 2013-06-26 MED ORDER — SIMVASTATIN 20 MG PO TABS
20.0000 mg | ORAL_TABLET | Freq: Every day | ORAL | Status: DC
  Administered 2013-06-26 – 2013-06-27 (×2): 20 mg via ORAL
  Filled 2013-06-26 (×2): qty 1

## 2013-06-26 MED ORDER — LISINOPRIL 10 MG PO TABS
10.0000 mg | ORAL_TABLET | Freq: Every day | ORAL | Status: DC
  Administered 2013-06-26 – 2013-06-28 (×2): 10 mg via ORAL
  Filled 2013-06-26 (×3): qty 1

## 2013-06-26 NOTE — Evaluation (Signed)
Occupational Therapy Evaluation Patient Details Name: [name] MRN: [id number] DOB: 29-Dec-1965 Today's Date: 06/26/2013    History of Present Illness [name] is a 48 y.o. 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   EMS was called and administered 2 mg of IV Ativan and  She was evaluated when she arrived at the ED and had a CT scan and MRI of the brain which revealed Extension of the infarct of the posterior Right temporal Lobe -Parietal Lobe, and an Occluded Right ICA.  She states that she has not taken her medication prescribed for her blood pressure in three months secondary to not having the money to afford the medication.   Clinical Impression   Pt is currently presenting to acute OT with above situation.  At baseline she has deficits in her LUE and LLE, but pt indicates that she is currently weaker than baseline.  She ahs been receiving ADL assist from her mother and is comfortable continuing to receive this assist. Pt has not been transferring into shower lately though, due to decreased ability to transfer out of shower - pt was interested in TTB to ease safety in transfers.  Pt will benefit from continued OT services to increase functional use of LUE.  Pt indicated her mother is currently ill, leading to concern about her ability to care for pt's needs, but pt wishes to return home. Recommend outpatient OT services.    Follow Up Recommendations  Outpatient OT (Pt could also benefit from SNF placement, also considering potential of mother's current illness. Pt prefers outpatient therapy.)    Equipment Recommendations  Tub/shower bench    Recommendations for Other Services       Precautions / Restrictions Precautions Precautions: None Restrictions Weight Bearing Restrictions: No       Mobility Bed Mobility Overal bed mobility: Modified Independent                Transfers Overall transfer level: Modified independent Equipment used: Straight cane             General transfer comment: Cues to slow down    Balance Overall balance assessment: Needs assistance Sitting-balance support: Bilateral upper extremity supported Sitting balance-Leahy Scale: Good     Standing balance support: Single extremity supported Standing balance-Leahy Scale: Fair                  ADL   Grooming: Wash/dry hands;Supervision/safety;Standing   Upper Body Dressing : Minimal assistance     Toilet Transfer: Min guard Toileting- Clothing Manipulation and Hygiene: Min guard;Sit to/from stand     General ADL Comments: pt was very impulsize during ADL assessment, and also became frustrated at the difficulty she was having. Pt verbalized activities as being harder than normal.     Vision  pt does not wear glasses, but does have baseline blurriness.               Additional Comments: Pt tested well with tracking when following commands. Pt has verbalized a right visual preference.   Perception     Praxis      Pertinent Vitals/Pain Pt denies pain     Hand Dominance Right   Extremity/Trunk Assessment Upper Extremity Assessment Upper Extremity Assessment: LUE deficits/detail LUE Deficits / Details: Trace to no shoulder  flexion/abduction, full shoulder elevation, trace elbow flexion, 0/5 elbo extesion, 50% wrist extension, <25% wrist flexion, supination/pronation AROM to neutral, 3/5 digit flexion, 3/5 digits 1-3 extension, 3-/5 digits 4-5 extension. LUE Sensation: decreased light touch;decreased proprioception (fair stereognosis) LUE Coordination: decreased fine motor;decreased gross motor   Lower Extremity Assessment Lower Extremity Assessment: Defer to PT evaluation LLE Deficits / Details: hip strenght generally 2/5; knee 3-/5; ankle 2-/5 increased  extensor tone.       Communication Communication Communication: No difficulties   Cognition Arousal/Alertness: Awake/alert Behavior During Therapy: WFL for tasks assessed/performed Overall Cognitive Status: Within Functional Limits for tasks assessed                     General Comments       Exercises      Home Living Family/patient expects to be discharged to:: Private residence Living Arrangements: Parent Available Help at Discharge: Family (Pt's mother has been assisting with ADLs prior to admission - pt's mother is also currenlty undergoing cancer treatments and may be  in a weakened state.) Type of Home: House Home Access: Level entry Entrance Stairs-Number of Steps: level entry at the back   Home Layout: One level     Bathroom Shower/Tub: Chief Strategy Officer: Standard     Home Equipment: Cane - single point          Prior Functioning/Environment Level of Independence: Needs assistance  Gait / Transfers Assistance Needed: uses cane; pt stays inside ADL's / Homemaking Assistance Needed: Pt's mother assists with ADLs as needed, including with bathing. pt attempted to complete some IADLs, but requires assist (pt verbalizes she cannot fold laundry). Communication / Swallowing Assistance Needed: I      OT Diagnosis:  Hemiplegia - non-dominant side   OT Problem List: Decreased strength;Decreased range of motion;Impaired balance (sitting and/or standing);Impaired vision/perception;Decreased coordination;Impaired sensation;Impaired UE functional use   OT Treatment/Interventions: Self-care/ADL training;Therapeutic exercise;Neuromuscular education;Energy conservation;Manual therapy;Modalities;Therapeutic activities;Visual/perceptual remediation/compensation;Patient/family education;Balance training    OT Goals(Current goals can be found in the care plan section) Acute Rehab OT Goals Patient Stated Goal: "Trying to walk again" OT Goal  Formulation: With patient Time For Goal Achievement: 07/10/13 Potential to Achieve Goals: Good ADL Goals Pt Will Perform Upper Body Dressing: with modified independence Pt Will Perform Tub/Shower Transfer: with min assist;tub bench Pt/caregiver will Perform Home Exercise Program: Increased ROM;Increased strength;Left upper extremity  OT Frequency: Min 3X/week   Barriers to D/C: Decreased caregiver support (Pt verbalized her mother has been ill due to cancer dx)          End of Session: Equipment Utilized During Treatment: Gait belt  Activity Tolerance: Patient tolerated treatment well Patient left: in chair (with physical therapist)   Time: 6811-5726 OT Time Calculation (min): 46 min Charges:  OT General Charges $OT Visit: 1 Procedure OT Evaluation $Initial OT Evaluation Tier I: 1 Procedure OT Treatments $Self Care/Home Management : 8-22 mins G-Codes:      Marry Guan, MS, OTR/L [phone] 06/26/2013, 12:29 PM

## 2013-06-26 NOTE — Progress Notes (Signed)
*  PRELIMINARY RESULTS* Echocardiogram 2D Echocardiogram has been performed.  Xavier Fournier 06/26/2013, 11:58 AM

## 2013-06-26 NOTE — Progress Notes (Signed)
[id number] 

## 2013-06-26 NOTE — Progress Notes (Signed)
UR chart review completed.  

## 2013-06-26 NOTE — Progress Notes (Signed)
Tech came from [facility] to perform EEG patient was out of room having another test performed. EEG will be performed tomorrow 3/26

## 2013-06-26 NOTE — Consult Note (Signed)
Milroy A. Merlene Laughter, MD     [email or web address]          [name] is an 48 y.o. female.   ASSESSMENT/PLAN: 1. Epilepsia partialis continua/ Partial status epilepticus. The seizure is undoubtedly due to the patient's old cortical infarct. Review of her scan indicates that she likely does not have a new infarct but the increased signal seen is due to continuous seizures or not an acute infarct. The patient's Keppra will be increased.   2. 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 Risk factors nicotine addiction, hypertension and dyslipidemia. The patient will be restarted on 2 antiplatelet agents. Lipid medication will also be suggested.  3. Likely significant obstructive sleep apnea syndrome. Sleep study will be arranged in the outpatient setting.  4. Obesity.  This is a 48 year old right-handed black female who presents with the relatively acute onset of holocephalic headache. Headache started in mid range but gradually over a few hours increases to a 10/10 severe headache. She reports that she does have headaches from time to time but this was more unusual. Concurrently, she developed continuous twitching of the left side. The patient had a stroke in 2013 associated with mild left-sided weakness but was due to occluded right extracranial carotid disease. This is documented on CT and [place]. The patient was placed on dual antiplatelet agent of aspirin and Plavix. Unfortunately, the last 3 months she has been off her medications because of not having insurance and not having the finances. The patient does not report other symptoms such as chest pain, shortness of breath, GI GU symptoms. Does appears to be sleepy during the evaluation today. She does report having difficulty sleeping. She takes prescription medication for insomnia but she ran out of this medication. She reported that she is currently sleepy  because she did not sleep well last night in the emergency room. She admits to snoring.  GENERAL: This is a pleasant obese lady who is sleeping but easily arousable. She is in no acute distress.  HEENT: Supple. Atraumatic normocephalic. She has crowded posterior space and a large tongue.  ABDOMEN: soft  EXTREMITIES: No edema   BACK: Normal.  SKIN: Normal by inspection.    MENTAL STATUS: Alert and oriented. Speech, language and cognition are generally intact. Judgment and insight normal.   CRANIAL NERVES: Pupils are equal, round and reactive to light and accommodation; extra ocular movements are full, there is no significant nystagmus; visual fields are full; upper and lower facial muscles are normal in strength and symmetric, there is no flattening of the nasolabial folds; tongue is midline; uvula is midline; shoulder elevation is normal.  MOTOR: She has normal tone, bulk and strength on the right side. Left upper extremity has significant increased tone with continuous twitching. The strength is graded as 2/5. Left leg for 3/5 on hip flexion and dorsiflexion 0/5. Again, there is continuous twitching their involving the left lower extremity and increased tone.  COORDINATION: Left finger to nose is normal, right finger to nose is normal, No rest tremor; no intention tremor; no postural tremor; no bradykinesia. She has continuous twitching of the left upper extremity and left leg. Additionally, there is increased tone especially of the left upper extremity. There is also some twitching of the neck on the left side.  REFLEXES: Deep tendon reflexes are symmetrical and normal.   SENSATION: Normal to light touch.  The patient's brain MRI is reviewed in person. There is increased signal seen  on diffusion imaging involving the right parietal occipital area limited to the cortex/gray matter. The corresponding area is somewhat dark on the ADC scan. On FLAIR imaging in the area is associated with the  increased signal and also dark signal suggestive of encephalomalacia. The corresponding areas consistent with the old infarct involving the posterior watershed distribution between the PCA and Marathon distribution. 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     Past Medical History  Diagnosis Date  . Stroke   . Hypertension   . High cholesterol     Past Surgical History  Procedure Laterality Date  . Dilation and curettage of uterus      History reviewed. No pertinent family history.  Social History:  reports that she has been smoking Cigarettes.  She has been smoking about 0.00 packs per day. She does not have any smokeless tobacco history on file. She reports that she drinks alcohol. She reports that she does not use illicit drugs.  Allergies: No Known Allergies  Medications: Prior to Admission medications   Not on File    Scheduled Meds: . aspirin EC  325 mg Oral Daily  . enoxaparin (LOVENOX) injection  40 mg Subcutaneous Q24H  . levETIRAcetam  500 mg Intravenous Q12H   Continuous Infusions:  PRN Meds:.fentaNYL, LORazepam, senna-docusate   Blood pressure 134/87, pulse 69, temperature 97.6 F (36.4 C), temperature source Oral, resp. rate 20, height 5' 6"  (1.676 m), weight 122.4 kg (269 lb 13.5 oz), last menstrual period 07/29/2012, SpO2 99.00%.   Results for orders placed during the hospital encounter of 06/25/13 (from the past 48 hour(s))  CBC WITH DIFFERENTIAL     Status: None   Collection Time    06/25/13  7:52 PM      Result Value Ref Range   WBC 7.0  4.0 - 10.5 K/uL   RBC 4.30  3.87 - 5.11 MIL/uL   Hemoglobin 12.0  12.0 - 15.0 g/dL   HCT 37.8  36.0 - 46.0 %   MCV 87.9  78.0 - 100.0 fL   MCH 27.9  26.0 - 34.0 pg   MCHC 31.7  30.0 - 36.0 g/dL   RDW 14.4  11.5 - 15.5 %   Platelets 380  150 - 400 K/uL   Neutrophils Relative % 65  43 - 77 %   Neutro Abs 4.5  1.7 - 7.7 K/uL   Lymphocytes Relative 29  12 - 46 %   Lymphs Abs 2.0  0.7 - 4.0 K/uL     Monocytes Relative 5  3 - 12 %   Monocytes Absolute 0.4  0.1 - 1.0 K/uL   Eosinophils Relative 1  0 - 5 %   Eosinophils Absolute 0.1  0.0 - 0.7 K/uL   Basophils Relative 0  0 - 1 %   Basophils Absolute 0.0  0.0 - 0.1 K/uL  BASIC METABOLIC PANEL     Status: None   Collection Time    06/25/13  7:52 PM      Result Value Ref Range   Sodium 142  137 - 147 mEq/L   Potassium 4.0  3.7 - 5.3 mEq/L   Chloride 103  96 - 112 mEq/L   CO2 27  19 - 32 mEq/L   Glucose, Bld 91  70 - 99 mg/dL   BUN 9  6 - 23 mg/dL   Creatinine, Ser 0.70  0.50 - 1.10 mg/dL   Calcium 9.2  8.4 - 10.5 mg/dL   GFR calc non Af  Amer >90  >90 mL/min   GFR calc Af Amer >90  >90 mL/min   Comment: (NOTE)     The eGFR has been calculated using the CKD EPI equation.     This calculation has not been validated in all clinical situations.     eGFR's persistently <90 mL/min signify possible Chronic Kidney     Disease.  GLUCOSE, CAPILLARY     Status: None   Collection Time    06/26/13 12:35 AM      Result Value Ref Range   Glucose-Capillary 98  70 - 99 mg/dL  GLUCOSE, CAPILLARY     Status: None   Collection Time    06/26/13  3:39 AM      Result Value Ref Range   Glucose-Capillary 94  70 - 99 mg/dL   Comment 1 Documented in Chart     Comment 2 Notify RN    LIPID PANEL     Status: Abnormal   Collection Time    06/26/13  6:07 AM      Result Value Ref Range   Cholesterol 224 (*) 0 - 200 mg/dL   Triglycerides 129  <150 mg/dL   HDL 49  >39 mg/dL   Total CHOL/HDL Ratio 4.6     VLDL 26  0 - 40 mg/dL   LDL Cholesterol 149 (*) 0 - 99 mg/dL   Comment:            Total Cholesterol/HDL:CHD Risk     Coronary Heart Disease Risk Table                         Men   Women      1/2 Average Risk   3.4   3.3      Average Risk       5.0   4.4      2 X Average Risk   9.6   7.1      3 X Average Risk  23.4   11.0                Use the calculated Patient Ratio     above and the CHD Risk Table     to determine the patient's CHD  Risk.                ATP III CLASSIFICATION (LDL):      <100     mg/dL   Optimal      100-129  mg/dL   Near or Above                        Optimal      130-159  mg/dL   Borderline      160-189  mg/dL   High      >190     mg/dL   Very High  GLUCOSE, CAPILLARY     Status: None   Collection Time    06/26/13  8:04 AM      Result Value Ref Range   Glucose-Capillary 76  70 - 99 mg/dL   Comment 1 Notify RN      Ct Head Wo Contrast  06/25/2013   CLINICAL DATA:  Headache. History of stroke with left-sided paralysis.  EXAM: CT HEAD WITHOUT CONTRAST  TECHNIQUE: Contiguous axial images were obtained from the base of the skull through the vertex without intravenous contrast.  COMPARISON:  04/07/2011  FINDINGS: Images are degraded by motion.  There is new hypoattenuation involving cortex and white matter and the predominantly medial right frontal lobe in the general distribution of the ACA. Slightly more extensive confluent hypoattenuation is present near the vertex. There is new, mild ex vacuo dilatation of the right lateral ventricle. There is no midline shift. No acute intracranial hemorrhage is identified. There is no midline shift or extra-axial fluid collection. Orbits are grossly unremarkable. Visualized mastoid air cells and paranasal sinuses are clear.  IMPRESSION: New right frontal lobe hypoattenuation compatible with ACA infarct, possibly subacute.  These results were called by telephone at the time of interpretation on 06/25/2013 at 7:14 PM to Dr. Wyvonnia Dusky, who verbally acknowledged these results.   Electronically Signed   By: Logan Bores   On: 06/25/2013 19:14   Mr Brain Wo Contrast  06/25/2013   CLINICAL DATA:  Altered mental status. Weakness. Hypertension. Hypercholesterolemia. Prior stroke.  EXAM: MRI HEAD WITHOUT CONTRAST  TECHNIQUE: Multiplanar, multiecho pulse sequences of the brain and surrounding structures were obtained without intravenous contrast.  COMPARISON:  06/25/2013 CT and  04/07/2011 MR.  FINDINGS: Exam is motion degraded.  Remote large right hemispheric infarct with encephalomalacia right frontal lobe, right parietal lobe and posterior right temporal lobe with subsequent dilation of the right lateral ventricle.  Acute infarct posterior right temporal -parietal lobe consistent with extension of previous infarct.  No obvious intracranial hemorrhage although motion degradation limits evaluation.  No obvious intracranial mass.  Chronic occlusion of the right internal carotid artery.  Soft tissue prominence posterior superior nasopharynx less bulbous than on the prior exam.  IMPRESSION: Chronically occluded right internal carotid artery and remote large right hemispheric infarct. Findings consistent with extension of this infarct now with acute infarction posterior right temporal lobe-parietal lobe. This suggests inadequate collateral flow.  No obvious hemorrhage noted on this motion degraded exam.  These results were called by telephone at the time of interpretation on 06/25/2013 at 9:26 PM to Dr. Virgel Manifold , who verbally acknowledged these results.   Electronically Signed   By: Chauncey Cruel M.D.   On: 06/25/2013 21:28        Melayah Skorupski A. Merlene Laughter, M.D.  Diplomate, Tax adviser of Psychiatry and Neurology ( Neurology). 06/26/2013, 8:42 AM

## 2013-06-26 NOTE — Clinical Social Work Note (Signed)
CSW received referral for medication assistance. Notified CM. Will sign off, but can be reconsulted if needed.  Derenda Fennel, [place] [phone]

## 2013-06-26 NOTE — Evaluation (Signed)
Physical Therapy Evaluation Patient Details Name: [name] MRN: [id number] DOB: [birth year] Today's Date: 06/26/2013   History of Present Illness  [name] is a 48 y.o. 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   EMS was called and administered 2 mg of IV Ativan and  She was evaluated when she arrived at the ED and had a CT scan and MRI of the brain which revealed Extension of the infarct of the posterior Right temporal Lobe -Parietal Lobe, and an Occluded Right ICA.  She states that she has not taken her medication prescribed for her blood pressure in three months secondary to not having the money to afford the medication.  Clinical Impression  Pt is a 48 yo female who states she has not taken her medication for three months due to financial reasons.  The pt has had an extension of her previous CVA.  Pt demonstrates decreased strength, decreased balance increased tone and will benefit from skilled PT to maximize her functional capacity.    Follow Up Recommendations Outpatient PT    Equipment Recommendations  None recommended by PT    Recommendations for Other Services OT consult     Precautions / Restrictions Precautions Precautions: None Restrictions Weight Bearing Restrictions: No      Mobility  Bed mobility Mod I; Sit to stand mod I Pt ambulated with assistive device x 15 feet with mod I when MD came in to exam pt therefore treatment was terminated.    Balance Overall balance assessment: Needs assistance         Standing balance support: Single extremity supported Standing balance-Leahy Scale: Fair   Single Leg Stance - Right Leg: 0 Single Leg Stance - Left Leg: 0                 Pertinent Vitals/Pain None noted.     Home Living Family/patient expects to be  discharged to:: Private residence Living Arrangements: Parent Available Help at Discharge: Family Type of Home: House Home Access: Level entry   Entrance Stairs-Number of Steps: level entry at the back Home Layout: One level Home Equipment: Cane - single point      Prior Function Level of Independence: Needs assistance   Gait / Transfers Assistance Needed: uses cane; pt stays inside  ADL's / Homemaking Assistance Needed: mother completes           Extremity/Trunk Assessment   Upper Extremity Assessment: Defer to OT evaluation     Lower Extremity Assessment: LLE deficits/detail   LLE Deficits / Details: hip strenght generally 2/5; knee 3-/5; ankle 2-/5 increased extensor tone.     Communication    I  Cognition Arousal/Alertness: Awake/alert   Overall Cognitive Status: Within Functional Limits for tasks assessed         Exercises General Exercises - Lower Extremity Long Arc Quad: Left;5 reps Hip ABduction/ADduction: Left;5 reps Straight Leg Raises: Left;5 reps Hip Flexion/Marching:  (attempted pt did not feel comfortable completing.) Heel Raises: Both;10 reps Mini-Sqauts: Both;10 reps      Assessment/Plan Pt to be seen 3x week.  PT will benefit from outpatient PT and OT services.   PT Assessment Patient needs continued PT services  PT Diagnosis Difficulty walking;Generalized weakness   PT Problem List Decreased strength;Decreased activity tolerance;Decreased balance;Decreased coordination;Impaired tone  PT Treatment Interventions Gait training;Therapeutic exercise;Balance training   PT Goals (Current goals can be found in the Care Plan section) Acute Rehab PT Goals Patient Stated Goal: to get as functional as possible.  PT states she can tell Lt LE is weaker than it was. PT Goal Formulation: With patient Time For Goal Achievement: 06/28/13 Potential to Achieve Goals: Good    Frequency Min 3X/week   Barriers to discharge  none      End of Session  Equipment Utilized During Treatment: Gait belt Activity Tolerance: Patient tolerated treatment well Patient left: in bed;with call bell/phone within reach;Other (comment) (MD present)         Time: 2119-4174 PT Time Calculation (min): 36 min   Charges:   PT Evaluation $Initial PT Evaluation Tier I: 1 Procedure             Terril Amaro,CINDY 06/26/2013, 9:51 AM

## 2013-06-26 NOTE — Progress Notes (Signed)
SLP Cancellation Note  Patient Details Name: [name] MRN: [id number] DOB: 1966/03/28   Cancelled treatment:       Reason Eval/Treat Not Completed: SLP screened, no needs identified, will sign off; Pt's speech, language, and swallow function are at baseline at this time.    [name] S 06/26/2013, 2:14 PM

## 2013-06-27 ENCOUNTER — Inpatient Hospital Stay (HOSPITAL_COMMUNITY)
Admit: 2013-06-27 | Discharge: 2013-06-27 | Disposition: A | Discharge status: Home / Self Care | Payer: PRIVATE HEALTH INSURANCE | Attending: Internal Medicine | Admitting: Internal Medicine

## 2013-06-27 LAB — RPR: RPR Ser Ql: NONREACTIVE

## 2013-06-27 LAB — TSH: TSH: 4.866 u[IU]/mL — ABNORMAL HIGH (ref 0.350–4.500)

## 2013-06-27 LAB — HIV ANTIBODY (ROUTINE TESTING W REFLEX): HIV: NONREACTIVE

## 2013-06-27 MED ORDER — PHENYTOIN SODIUM 50 MG/ML IJ SOLN
100.0000 mg | Freq: Three times a day (TID) | INTRAMUSCULAR | Status: DC
  Administered 2013-06-27 – 2013-06-28 (×4): 100 mg via INTRAVENOUS
  Filled 2013-06-27 (×4): qty 2

## 2013-06-27 MED ORDER — PHENYTOIN SODIUM 50 MG/ML IJ SOLN
1000.0000 mg | Freq: Once | INTRAMUSCULAR | Status: AC
  Administered 2013-06-27: 1000 mg via INTRAVENOUS
  Filled 2013-06-27: qty 20

## 2013-06-27 NOTE — Progress Notes (Signed)
[name], GRUEL NO.:  [id number]  MEDICAL RECORD NO.:  [id number]  LOCATION:  A334                          FACILITY:  [facility]  PHYSICIAN:  Melvyn Novas, MDDATE OF BIRTH:  1965/11/19  DATE OF PROCEDURE: DATE OF DISCHARGE:                                PROGRESS NOTE   A 48 year old, obese black female with chronic noncompliance, history of hyperlipidemia, hypertension, history of preceding CVA, total occlusion of internal carotid artery with presumed extension of the CVA to the right temporal parietal lobe based on CT.  She also had some partial seizure activity.  Keppra was instituted and then increased.  Currently on a 1000 mg IV q.12.  The patient placed on Lovenox for DVT prophylaxis only and a full dose aspirin.  The patient is alert and oriented. Swallowing study performed and she is tolerating a dysphagia III diet. She denies chest pain, palpitations, dizziness, or syncope.  She has a left hemiparesis of significance.  Blood pressure is 162/84, pulse is 80 and regular.  She is afebrile. Respiratory rate is 18.  Cholesterol 224.  The patient was previously placed on Pravachol.  She states she stopped her medicines due to no insurance and no money to pay for them.  She was also on lisinopril 20/12.5.  PLAN:  Right now is to continue full-dose aspirin.  Continue DVT prophylaxis with Lovenox.  Will get physical therapy consult for strengthening and ambulation.  Carotid ultrasound as well as a 2D echo are pending.  Appreciate Neurology consult.  The patient hemodynamically stable. We will reinstitute lisinopril 10 mg per day, currently on Zocor 20.  We will make further recommendations as the database expands.     Melvyn Novas, MD     RMD/MEDQ  D:  06/26/2013  T:  06/27/2013  Job:  [id number]

## 2013-06-27 NOTE — Progress Notes (Signed)
OT Cancellation Note  Patient Details Name: [name] MRN: [id number] DOB: 10/30/1965   Cancelled Treatment:    Reason Eval/Treat Not Completed: Patient at procedure or test/ unavailable Pt receiving EEG at time of attempted treatment. After discussion with PT, OT to re-eval for change in d/c recommendations. Based on previous eval and conversation with PT, changing d/c recommendation to [facility] for further safety for pt.  Marry Guan, MS, OTR/L [phone]  06/27/2013, 1:31 PM

## 2013-06-27 NOTE — Progress Notes (Signed)
Physical Therapy Treatment Patient Details Name: [name] MRN: [id number] DOB: [birth year]Today's Date: 06/27/2013             Follow Up Recommendations  Home health PT     Equipment Recommendations    may need a hemi walker      Precautions / Restrictions Precautions Precautions: Fall Restrictions Weight Bearing Restrictions: No                     Transfers Overall transfer level: Needs assistance Equipment used: Hemi-walker Transfers: Sit to/from Stand Sit to Stand: Min guard                                   )                                           Cognition Arousal/Alertness: Awake/alert Behavior During Therapy: Impulsive Overall Cognitive Status: History of cognitive impairments - at baseline                            General Comments:   this note is added in order to update discharge plan                                                        PT Plan Discharge plan needs to be updated                         Konrad Penta 06/27/2013, 1:12 PM

## 2013-06-27 NOTE — Progress Notes (Signed)
Physical Therapy Treatment Patient Details Name: COUMBA KELLISON MRN: [id number] DOB: [birth year] Today's Date: 06/27/2013    PT Comments    Possible functional and cognitive decline noted. Pt is confused with moments of lucidness. When donning shoes put the right shoe on the left foot and requires verbal cueing to correct. Pt states at 10:00 that she just looked at end clock and is said 4:00. She states: "My imagination is doing strange things." Pt is very impulsive. When asked to complete gait training she attempted to stand up quickly before therapist could don gait belt. She does not appear to realize the severity of her strength and balance deficits. Gait with SPC is extremely unsteady. Pt has difficulty holding cane in right hand. She tends to put cane too far in front of BOS and almost drops cane multiple times. Pt displays multiple LOB with gait training requiring mod assist to recover. Pt will be seen again today by physical therapist to reassess discharge plan.    Mobility  Bed Mobility Overal bed mobility: Modified Independent                Transfers Overall transfer level: Needs assistance Equipment used: Straight cane Transfers: Sit to/from Stand Sit to Stand: Min guard            Ambulation/Gait Ambulation/Gait assistance: Mod assist Ambulation Distance (Feet): 20 Feet Assistive device: Straight cane Gait Pattern/deviations: Decreased stance time - left;Drifts right/left;Trunk flexed;Decreased weight shift to left;Decreased dorsiflexion - left   Gait velocity interpretation: Below normal speed for age/gender General Gait Details: Gait with SPC is extremely unsteady. Pt has difficulty hold cane in right hand and almost drops it multiple times. Pt tends to drag LLE and displays multiple LOB requiring mod assist to recover.                            Pertinent Vitals/Pain No pain.     PT Plan Discharge plan needs to be updated (PT plans to see  pt again reguarding discharge plan.)    End of Session Equipment Utilized During Treatment: Gait belt Activity Tolerance: Patient tolerated treatment well Patient left: in bed;with call bell/phone within reach;with bed alarm set     Time: 0940-1000 PT Time Calculation (min): 20 min  Charges:  $Gait Training: 8-22 mins                     Seth Bake, PTA  06/27/2013, 10:19 AM

## 2013-06-27 NOTE — Progress Notes (Signed)
This patient has been seen twice this AM for gait training with multiple assistive devices by 2 PTAs.  They have been very concerned about gait instability in all settings.  I have been present intermittently during both PT sessions.  In my opinion, pt's primary gait instability is due to her inability to accept, understand and follow through with teaching.  She is extremely impulsive and doesn't have any comprehension of her deficits.  Prior to this event, pt states that she usually did not use any assistive device for gait ("except when I would lose my balance I would use my cane").  Currently, she continues to try to ambulate with no assistive device if given the opportunity.  She is at risk of falling, but because of her cognitive problems she will not be appropriate for SNF.  She would, however, be more appropriate for HHPT rather than OP PT as initially recommended.

## 2013-06-27 NOTE — Progress Notes (Signed)
About 2000 last night when I went into patients room to begin my assessment pt was confused saying that when she came back from her CT scan they switched her room. She swore that she was in a different room and that the room and bathroom looked completely different. I told patient that she was in the same room and reoriented her.

## 2013-06-27 NOTE — Progress Notes (Signed)
EEG completed; results pending.    

## 2013-06-27 NOTE — Care Management Note (Signed)
    Page 1 of 2   06/28/2013     3:51:43 PM   CARE MANAGEMENT NOTE 06/28/2013  Patient:  [name], [name]   Account Number:  [id number]  Date Initiated:  06/27/2013  Documentation initiated by:  Rosemary Holms  Subjective/Objective Assessment:   Pt from home who has had a CVA. PT recommending HH PT. CM will ask [facility] to send PT out to assess home and fall risk at home. Also will order SW.     Action/Plan:   Anticipated DC Date:  06/28/2013   Anticipated DC Plan:  HOME W HOME HEALTH SERVICES  In-house referral  Clinical Social Worker      DC Planning Services  CM consult  MATCH Program      [facility] Choice  HOME HEALTH   Choice offered to / List presented to:  C-1 Patient        HH arranged  HH-2 PT  HH-6 SOCIAL WORKER      HH agency  [facility]   Status of service:  Completed, signed off Medicare Important Message given?   (If response is "NO", the following Medicare IM given date fields will be blank) Date Medicare IM given:   Date Additional Medicare IM given:    Discharge Disposition:    Per UR Regulation:  Reviewed for med. necessity/level of care/duration of stay  If discussed at Long Length of Stay Meetings, dates discussed:    Comments:  06/28/13 1530 Anibal Henderson RN  HH to follow at D/C. Pt C/O having no money to buy her medications. Assisted through [facility] program, and encouraged to visit [facility] for assistance if she is not able to afford meds in the future. She will be seeing Dr Janna Arch, her PCP,  in 1 week. Reminded these meds are to controm B/P and  hopefully help prevent another CVA 06/27/13 Amy Leanord Hawking RN BSN CM Spoke to [place] at [facility]. Requested a PT eval of home and safety. Also requested a SW to assist as needed.

## 2013-06-27 NOTE — Progress Notes (Signed)
[id number] 

## 2013-06-27 NOTE — Progress Notes (Signed)
Patient ID: [name], female   DOB: 07-10-1965, 48 y.o.   MRN: [id number]  Elizabeth A. Merlene Laughter, MD     [email or web address]          [name] is an 48 y.o. female.   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 The seizure is undoubtedly due to the patient's old cortical infarct. Review of her scan indicates that she likely does not have a new infarct but the increased signal seen is due to continuous seizures or not an acute infarct. Given the recurrent and continuous twitching which we think is due to partial seizures. The patient will be started on Dilantin. The EEG will be followed up.  2. 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 Risk factors nicotine addiction, hypertension and dyslipidemia. The patient will be restarted on 2 antiplatelet agents. Lipid medication will also be suggested.  3. Likely significant obstructive sleep apnea syndrome. Sleep study will be arranged in the outpatient setting.  4. Obesity.    She continues to have frequent twitching of the left upper extremity. She apparently had a spell of confusion on yesterday and disorientation after coming back from her testing. Unfortunately, her EEG was not done.  GENERAL: This is a pleasant obese lady who is sleeping but easily arousable. She is in no acute distress.  HEENT: Supple. Atraumatic normocephalic. She has crowded posterior space and a large tongue.  ABDOMEN: soft  EXTREMITIES: No edema  BACK: Normal.  SKIN: Normal by inspection.  MENTAL STATUS: Alert and oriented. Speech, language and cognition are generally intact. Judgment and insight normal.  CRANIAL NERVES: Pupils are equal, round and reactive to light and accommodation; extra ocular movements are full, there is no significant nystagmus; visual fields are full; upper and lower facial  muscles are normal in strength and symmetric, there is no flattening of the nasolabial folds; tongue is midline; uvula is midline; shoulder elevation is normal.  MOTOR: She has normal tone, bulk and strength on the right side. Left upper extremity has significant increased tone with continuous twitching. The strength is graded as 2/5. Left leg for 3/5 on hip flexion and dorsiflexion 0/5. Again, there is continuous twitching their involving the left lower extremity and increased tone.  COORDINATION: Left finger to nose is normal, right finger to nose is normal, No rest tremor; no intention tremor; no postural tremor; no bradykinesia. She has continuous twitching of the left upper extremity and left leg. Additionally, there is increased tone especially of the left upper extremity. There is also some twitching of the neck on the left side.  REFLEXES: Deep tendon reflexes are symmetrical and normal.  SENSATION: Normal to light touch.     Objective: Vital signs in last 24 hours: Temp:  [97.3 F (36.3 C)-98.7 F (37.1 C)] 97.7 F (36.5 C) (03/26 0436) Pulse Rate:  [69-82] 69 (03/26 0436) Resp:  [20] 20 (03/26 0436) BP: (98-126)/(57-65) 98/58 mmHg (03/26 0436) SpO2:  [98 %-100 %] 100 % (03/26 0436) Weight:  [122.108 kg (269 lb 3.2 oz)] 122.108 kg (269 lb 3.2 oz) (03/26 0436)  Intake/Output from previous day: 03/25 0701 - 03/26 0700 In: 350 [P.O.:240; IV Piggyback:110] Out: 300 [Urine:300] Intake/Output this shift:   Nutritional status: Dysphagia   Lab Results: Results for orders placed during the hospital encounter of 06/25/13 (from the past 48 hour(s))  RPR     Status: None   Collection Time  06/25/13  2:16 PM      Result Value Ref Range   RPR NON REACTIVE  NON REACTIVE   Comment: Performed at [place] (ROUTINE TESTING)     Status: None   Collection Time    06/25/13  2:16 PM      Result Value Ref Range   HIV NON REACTIVE  NON REACTIVE   Comment: (NOTE)      Effective July 08, 2013, [employer] will no longer offer the     current 3rd Generation HIV diagnostic screening assay, HIV Antibodies,     HIV-1/2 EIA, with reflexes. At that time, [employer] will     only offer HIV-1/2 Ag/Ab, 4th Gen, w/ Reflexes as recommended by the     CDC. This HIV diagnostic screening assay tests for antibodies to HIV-1     and HIV-2 as well as HIV p24 antigen and provides greater sensitivity     for the detection of recent infection. Any orders for the 3rd     Generation assay will automatically be referred to the 4th Generation     assay.     Performed at [employer] [facility]  CBC WITH DIFFERENTIAL     Status: None   Collection Time    06/25/13  7:52 PM      Result Value Ref Range   WBC 7.0  4.0 - 10.5 K/uL   RBC 4.30  3.87 - 5.11 MIL/uL   Hemoglobin 12.0  12.0 - 15.0 g/dL   HCT 37.8  36.0 - 46.0 %   MCV 87.9  78.0 - 100.0 fL   MCH 27.9  26.0 - 34.0 pg   MCHC 31.7  30.0 - 36.0 g/dL   RDW 14.4  11.5 - 15.5 %   Platelets 380  150 - 400 K/uL   Neutrophils Relative % 65  43 - 77 %   Neutro Abs 4.5  1.7 - 7.7 K/uL   Lymphocytes Relative 29  12 - 46 %   Lymphs Abs 2.0  0.7 - 4.0 K/uL   Monocytes Relative 5  3 - 12 %   Monocytes Absolute 0.4  0.1 - 1.0 K/uL   Eosinophils Relative 1  0 - 5 %   Eosinophils Absolute 0.1  0.0 - 0.7 K/uL   Basophils Relative 0  0 - 1 %   Basophils Absolute 0.0  0.0 - 0.1 K/uL  BASIC METABOLIC PANEL     Status: None   Collection Time    06/25/13  7:52 PM      Result Value Ref Range   Sodium 142  137 - 147 mEq/L   Potassium 4.0  3.7 - 5.3 mEq/L   Chloride 103  96 - 112 mEq/L   CO2 27  19 - 32 mEq/L   Glucose, Bld 91  70 - 99 mg/dL   BUN 9  6 - 23 mg/dL   Creatinine, Ser 0.70  0.50 - 1.10 mg/dL   Calcium 9.2  8.4 - 10.5 mg/dL   GFR calc non Af Amer >90  >90 mL/min   GFR calc Af Amer >90  >90 mL/min   Comment: (NOTE)     The eGFR has been calculated using the CKD EPI equation.     This calculation  has not been validated in all clinical situations.     eGFR's persistently <90 mL/min signify possible Chronic Kidney     Disease.  GLUCOSE, CAPILLARY  Status: None   Collection Time    06/26/13 12:35 AM      Result Value Ref Range   Glucose-Capillary 98  70 - 99 mg/dL  GLUCOSE, CAPILLARY     Status: None   Collection Time    06/26/13  3:39 AM      Result Value Ref Range   Glucose-Capillary 94  70 - 99 mg/dL   Comment 1 Documented in Chart     Comment 2 Notify RN    HEMOGLOBIN A1C     Status: None   Collection Time    06/26/13  5:53 AM      Result Value Ref Range   Hemoglobin A1C 5.2  <5.7 %   Comment: (NOTE)                                                                               According to the ADA Clinical Practice Recommendations for 2011, when     HbA1c is used as a screening test:      >=6.5%   Diagnostic of Diabetes Mellitus               (if abnormal result is confirmed)     5.7-6.4%   Increased risk of developing Diabetes Mellitus     References:Diagnosis and Classification of Diabetes Mellitus,Diabetes     PVXY,8016,55(VZSMO 1):S62-S69 and Standards of Medical Care in             Diabetes - 2011,Diabetes Care,2011,34 (Suppl 1):S11-S61.   Mean Plasma Glucose 103  <117 mg/dL   Comment: Performed at [place]     Status: Abnormal   Collection Time    06/26/13  6:07 AM      Result Value Ref Range   Cholesterol 224 (*) 0 - 200 mg/dL   Triglycerides 129  <150 mg/dL   HDL 49  >39 mg/dL   Total CHOL/HDL Ratio 4.6     VLDL 26  0 - 40 mg/dL   LDL Cholesterol 149 (*) 0 - 99 mg/dL   Comment:            Total Cholesterol/HDL:CHD Risk     Coronary Heart Disease Risk Table                         Men   Women      1/2 Average Risk   3.4   3.3      Average Risk       5.0   4.4      2 X Average Risk   9.6   7.1      3 X Average Risk  23.4   11.0                Use the calculated Patient Ratio     above and the CHD Risk Table     to  determine the patient's CHD Risk.                ATP III CLASSIFICATION (LDL):      <100     mg/dL   Optimal      100-129  mg/dL   Near or Above                        Optimal      130-159  mg/dL   Borderline      160-189  mg/dL   High      >190     mg/dL   Very High  GLUCOSE, CAPILLARY     Status: None   Collection Time    06/26/13  8:04 AM      Result Value Ref Range   Glucose-Capillary 76  70 - 99 mg/dL   Comment 1 Notify RN    GLUCOSE, CAPILLARY     Status: None   Collection Time    06/26/13 11:25 AM      Result Value Ref Range   Glucose-Capillary 89  70 - 99 mg/dL   Comment 1 Notify RN      Lipid Panel  Recent Labs  06/26/13 0607  CHOL 224*  TRIG 129  HDL 49  CHOLHDL 4.6  VLDL 26  LDLCALC 149*    Studies/Results: Ct Head Wo Contrast  06/25/2013   CLINICAL DATA:  Headache. History of stroke with left-sided paralysis.  EXAM: CT HEAD WITHOUT CONTRAST  TECHNIQUE: Contiguous axial images were obtained from the base of the skull through the vertex without intravenous contrast.  COMPARISON:  04/07/2011  FINDINGS: Images are degraded by motion. There is new hypoattenuation involving cortex and white matter and the predominantly medial right frontal lobe in the general distribution of the ACA. Slightly more extensive confluent hypoattenuation is present near the vertex. There is new, mild ex vacuo dilatation of the right lateral ventricle. There is no midline shift. No acute intracranial hemorrhage is identified. There is no midline shift or extra-axial fluid collection. Orbits are grossly unremarkable. Visualized mastoid air cells and paranasal sinuses are clear.  IMPRESSION: New right frontal lobe hypoattenuation compatible with ACA infarct, possibly subacute.  These results were called by telephone at the time of interpretation on 06/25/2013 at 7:14 PM to Dr. Wyvonnia Dusky, who verbally acknowledged these results.   Electronically Signed   By: Logan Bores   On: 06/25/2013 19:14   Mr  Brain Wo Contrast  06/25/2013   CLINICAL DATA:  Altered mental status. Weakness. Hypertension. Hypercholesterolemia. Prior stroke.  EXAM: MRI HEAD WITHOUT CONTRAST  TECHNIQUE: Multiplanar, multiecho pulse sequences of the brain and surrounding structures were obtained without intravenous contrast.  COMPARISON:  06/25/2013 CT and 04/07/2011 MR.  FINDINGS: Exam is motion degraded.  Remote large right hemispheric infarct with encephalomalacia right frontal lobe, right parietal lobe and posterior right temporal lobe with subsequent dilation of the right lateral ventricle.  Acute infarct posterior right temporal -parietal lobe consistent with extension of previous infarct.  No obvious intracranial hemorrhage although motion degradation limits evaluation.  No obvious intracranial mass.  Chronic occlusion of the right internal carotid artery.  Soft tissue prominence posterior superior nasopharynx less bulbous than on the prior exam.  IMPRESSION: Chronically occluded right internal carotid artery and remote large right hemispheric infarct. Findings consistent with extension of this infarct now with acute infarction posterior right temporal lobe-parietal lobe. This suggests inadequate collateral flow.  No obvious hemorrhage noted on this motion degraded exam.  These results were called by telephone at the time of interpretation on 06/25/2013 at 9:26 PM to Dr. Virgel Manifold , who verbally acknowledged these results.   Electronically Signed   By: Chauncey Cruel M.D.   On:  06/25/2013 21:28   US Carotid Duplex Bilateral  06/26/2013   CLINICAL DATA:  Right-sided CVA  EXAM: BILATERAL CAROTID DUPLEX ULTRASOUND  TECHNIQUE: Pearline Cables scale imaging, color Doppler and duplex ultrasound were performed of bilateral carotid and vertebral arteries in the neck.  COMPARISON:  MR HEAD W/O CM dated 06/25/2013; MR HEAD W/O CM dated 04/07/2011  FINDINGS: The examination is degraded due to patient body habitus and difficulty tolerating the examination.   Criteria: Quantification of carotid stenosis is based on velocity parameters that correlate the residual internal carotid diameter with NASCET-based stenosis levels, using the diameter of the distal internal carotid lumen as the denominator for stenosis measurement.  The following velocity measurements were obtained:  RIGHT  ICA:  Occluded  CCA:  16/10 cm/sec  SYSTOLIC ICA/CCA RATIO:  N/A  DIASTOLIC ICA/CCA RATIO:  N/A  ECA:  112 cm/sec  LEFT  ICA:  109/37 cm/sec  CCA:  96/04 cm/sec  SYSTOLIC ICA/CCA RATIO:  1.2  DIASTOLIC ICA/CCA RATIO:  2.1  ECA:  94 cm/sec  RIGHT CAROTID ARTERY: The right internal carotid artery appears occluded shortly after its origin (image 31, 34 and 37).  RIGHT VERTEBRAL ARTERY:  Antegrade flow  LEFT CAROTID ARTERY: There is a minimal amount of eccentric hypoechoic plaque involving the distal aspect of the left common carotid artery (image 53). There is a minimal amount of eccentric hypoechoic plaque within the left carotid bulb (image 57), not resulting in elevated peak systolic velocities within the interrogated course of the left internal carotid artery to suggest a hemodynamically significant stenosis.  LEFT VERTEBRAL ARTERY:  Antegrade flow  IMPRESSION: 1. Degraded examination demonstrates chronic occlusion of the right internal carotid artery (as demonstrated on remote brain MRI performed 04/2011). 2. Minimal amount of left-sided atherosclerotic plaque, not definitely resulting in a hemodynamically significant stenosis. 3. Preserved antegrade flow demonstrated within the bilateral vertebral arteries.   Electronically Signed   By: Sandi Mariscal M.D.   On: 06/26/2013 16:31   Dg Chest Port 1 View  06/26/2013   CLINICAL DATA:  History of CVA on March 24th  EXAM: PORTABLE CHEST - 1 VIEW  COMPARISON:  None.  FINDINGS: The lungs are well-expanded and clear. The cardiopericardial silhouette is normal in size. The mediastinum is normal in width. There is no pleural effusion. The trachea is  midline. The observed portions of the bony thorax exhibit no acute abnormalities.  IMPRESSION: There is no evidence of active cardiopulmonary disease.   Electronically Signed   By: David  Martinique   On: 06/26/2013 15:13    Medications:  Scheduled Meds: . aspirin EC  325 mg Oral Daily  . enoxaparin (LOVENOX) injection  40 mg Subcutaneous Q24H  . levETIRAcetam  1,000 mg Intravenous Q12H  . lisinopril  10 mg Oral Daily  . simvastatin  20 mg Oral q1800   Continuous Infusions:  PRN Meds:.fentaNYL, LORazepam, senna-docusate     LOS: 2 days   Chestina Komatsu A. Merlene Frost, M.D.  Diplomate, Tax adviser of Psychiatry and Neurology ( Neurology).

## 2013-06-27 NOTE — Progress Notes (Signed)
Physical Therapy Treatment Patient Details Name: [name] MRN: [id number] DOB: Mar 29, 1966 Today's Date: 06/27/2013    History of Present Illness      PT Comments    Pt very impulsive through session.  Trial with change of AD to hemiwalker.  Gait training demonstration and verbal cueing for proper sequencing with hemiwalker.  Multimodal cueing attempts, pt unable to following instruction.  Pt gait very unstable requiring mod assistance for LOB episodes.  Current gait mechanics indicate high risk of falls. Pt left in chair with nursing aid in room and chair alarm set, call bell within reach.    Follow Up Recommendations        Equipment Recommendations       Recommendations for Other Services       Precautions / Restrictions Precautions Precautions: Fall Restrictions Weight Bearing Restrictions: No    Mobility  Bed Mobility Overal bed mobility: Modified Independent                Transfers Overall transfer level: Needs assistance Equipment used: Hemi-walker Transfers: Sit to/from Stand Sit to Stand: Min guard         General transfer comment: Cueing to slow down and improve spatial awareness to reduce risk of falls  Ambulation/Gait Ambulation/Gait assistance: Mod assist Ambulation Distance (Feet): 25 Feet Assistive device: Hemi-walker Gait Pattern/deviations: Trunk flexed;Decreased step length - right;Decreased stance time - left   Gait velocity interpretation: Below normal speed for age/gender General Gait Details: Unstable stance, max cueing for sequence with hemiwalker.  Pt drags Lt LE and displays multiple LOB requiring mod assistance to recover.   Stairs            Wheelchair Mobility    Modified Rankin (Stroke Patients Only)       Balance                                    Cognition Arousal/Alertness: Awake/alert Behavior During Therapy: Impulsive Overall Cognitive Status: History of cognitive impairments - at  baseline                      Exercises      General Comments         Home Living                      Prior Function            PT Goals (current goals can now be found in the care plan section) Progress towards PT goals: Progressing toward goals    Frequency       PT Plan Discharge plan needs to be updated    End of Session Equipment Utilized During Treatment: Gait belt Activity Tolerance: Patient tolerated treatment well Patient left: in chair;with call bell/phone within reach;with chair alarm set     Time: 1130-1155 PT Time Calculation (min): 25 min  Charges:  $Gait Training: 8-22 mins $Therapeutic Activity: 8-22 mins                    G Codes:      [name] 06/27/2013, 12:23 PM

## 2013-06-27 NOTE — Progress Notes (Signed)
[name], [name] NO.:  [id number]  MEDICAL RECORD NO.:  [id number]  LOCATION:  A334                          FACILITY:  [facility]  PHYSICIAN:  Melvyn Novas, MDDATE OF BIRTH:  03-31-1966  DATE OF PROCEDURE: DATE OF DISCHARGE:                                PROGRESS NOTE   SUBJECTIVE:  The patient had a right-sided CVA which is an extension from previous CVA, essentially has had chronic noncompliance with hyperlipidemia and hypertension; also tobacco abuse, 1/2 to 1 pack per day according to the patient.  The patient is currently hemodynamically stable.  Left-sided hemiparesis is mildly improved.  The patient was placed on aspirin and Lovenox for DVT prophylaxis only. Carotid ultrasound reveals chronically occluded right internal carotid system with patent vertebral arteries system. MRI, MRA reveal similar anatomic physiologic findings.  OBJECTIVE:  GENERAL:  Alert and oriented.  Undergoing physical therapy. LUNGS:  Clear.  No rales, wheeze, or rhonchi. HEART:  Regular rhythm.  No murmurs, gallops, or rubs. ABDOMEN:  Soft, nontender.  Bowel sounds normoactive. VITAL SIGNS:  Blood pressure is 132/78.  She is afebrile.  Respiratory rate is 18.  ASSESSMENT AND PLAN:  The question at present is should this patient be considered for revascularization of her right side and if so, 1 would be the appropriate timing.  She is currently controlled on aspirin and lisinopril 10 and Zocor at 20.  She states she stopped her medicines due to financial issues.  We will await Neurology input as to possible revascularization and possible timing of revascularization if they deem clinically appropriate.     Melvyn Novas, MD     RMD/MEDQ  D:  06/27/2013  T:  06/27/2013  Job:  [id number]

## 2013-06-28 MED ORDER — PRAVASTATIN SODIUM 40 MG PO TABS: 40.0000 mg | ORAL_TABLET | Freq: Every day | ORAL | Status: DC

## 2013-06-28 MED ORDER — PHENYTOIN SODIUM EXTENDED 100 MG PO CAPS: 100.0000 mg | ORAL_CAPSULE | Freq: Three times a day (TID) | ORAL | Status: DC

## 2013-06-28 MED ORDER — ENOXAPARIN SODIUM 60 MG/0.6ML ~~LOC~~ SOLN: 60.0000 mg | SUBCUTANEOUS | Status: DC

## 2013-06-28 MED ORDER — PHENYTOIN SODIUM EXTENDED 100 MG PO CAPS
100.0000 mg | ORAL_CAPSULE | Freq: Three times a day (TID) | ORAL | Status: DC
  Administered 2013-06-28: 100 mg via ORAL
  Filled 2013-06-28: qty 1

## 2013-06-28 MED ORDER — LEVETIRACETAM 500 MG PO TABS
1500.0000 mg | ORAL_TABLET | Freq: Two times a day (BID) | ORAL | Status: DC
  Administered 2013-06-28: 1500 mg via ORAL
  Filled 2013-06-28: qty 3

## 2013-06-28 MED ORDER — ASPIRIN 325 MG PO TBEC: 325.0000 mg | DELAYED_RELEASE_TABLET | Freq: Every day | ORAL | Status: DC

## 2013-06-28 MED ORDER — STROKE: EARLY STAGES OF RECOVERY BOOK
Freq: Once | Status: AC
  Administered 2013-06-28: 10:00:00
  Filled 2013-06-28: qty 1

## 2013-06-28 MED ORDER — LISINOPRIL 10 MG PO TABS: 20.0000 mg | ORAL_TABLET | Freq: Every day | ORAL | Status: DC

## 2013-06-28 MED ORDER — LEVETIRACETAM 750 MG PO TABS: 1500.0000 mg | ORAL_TABLET | Freq: Two times a day (BID) | ORAL | Status: DC

## 2013-06-28 NOTE — Discharge Summary (Signed)
NAME:  GALLOWAY, Hanaan              ACCOUNT NO.:  [id number]  MEDICAL RECORD NO.:  [id number]  LOCATION:  EE                           FACILITY:  [facility]  PHYSICIAN:  Kaylyn Garrow Michael Lenoir Facchini, MDDATE OF BIRTH:  02/13/1966  DATE OF ADMISSION:  06/27/2013 DATE OF DISCHARGE:  03/26/2015LH                              DISCHARGE SUMMARY   A 48-year-old obese, black female with a history of hypertension, hyperlipidemia, who had a previous right-sided CVA with total occlusion of the internal carotid system previously, admitted to the hospital with was appeared to be an extension of the CVA, however, reviewed by Neurology, Dr. Doonquah, was not quite convinced of this.  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 Dilantin.  There was no further seizure activity.  She was hemodynamically stable.  Her cholesterol was 223.  She had been noncompliant with hypertensive and lipid medicines due to she states financial issues although she has Medicaid.  The patient had physical therapy and did well.  She had a left-sided hemiparesis.  Discussion was had about possible revascularization with Dr. Doonquah, did not feel this was advantageous certainly at present.  Blood pressure is well controlled.  A 2-D echo revealed ejection fraction of 50% to 55%.  No evidence of thrombogenic source from the atrioventricular valves.  She was urged to have smoking cessation, and she will follow up in the office for this counseling.  She was discharged on the following medicines; aspirin 325 p.o. daily, lisinopril 10 mg p.o. daily, Pravachol 40 mg p.o. daily, and Dilantin 100 mg p.o. t.i.d.  She is urged to follow up for smoking cessation counseling, possibly Chantix, and not to smoke.  She understands this and verbalizes that she will comply.     Carlette Palmatier Michael Canton Yearby, MD     RMD/MEDQ  D:   06/28/2013  T:  06/28/2013  Job:  [id number] 

## 2013-06-28 NOTE — Progress Notes (Signed)
Patient has discharge summary but no discharge order.  Dr. Janna Arch paged by RN.  Dr. Janna Arch returned page and gave order for patient to be discharged today.

## 2013-06-28 NOTE — Procedures (Signed)
  [facility] [name] A. [name], MD     [email or web address]           HISTORY: This 48 year old presented to recurrent clonic activity of the left side. 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  MEDICATIONS: Scheduled Meds: Continuous Infusions: PRN Meds:.    Prior to Admission medications   Medication Sig Start Date End Date Taking? Authorizing Provider  aspirin EC 325 MG EC tablet Take 1 tablet (325 mg total) by mouth daily. 06/28/13   Isabella Stalling, MD  levETIRAcetam (KEPPRA) 750 MG tablet Take 2 tablets (1,500 mg total) by mouth 2 (two) times daily. 06/28/13   Isabella Stalling, MD  lisinopril (PRINIVIL,ZESTRIL) 10 MG tablet Take 2 tablets (20 mg total) by mouth daily. 06/28/13   Isabella Stalling, MD  phenytoin (DILANTIN) 100 MG ER capsule Take 1 capsule (100 mg total) by mouth 3 (three) times daily. 06/28/13   Isabella Stalling, MD  phenytoin (DILANTIN) 100 MG ER capsule Take 1 capsule (100 mg total) by mouth 3 (three) times daily. 06/28/13   Isabella Stalling, MD  pravastatin (PRAVACHOL) 40 MG tablet Take 1 tablet (40 mg total) by mouth daily. 06/28/13   Isabella Stalling, MD      ANALYSIS: A 16 channel recording using standard 10 20 measurements is conducted for 23 minutes. There is a posterior dominant rhythm of 11 Hz on the left side and tend have her son the right side. However, there is superimposed 2-4 Hz delta slowing seen over the right hemisphere. Additionally, there is focal slowing noted of the right temporal area T4 and T6. No clear epileptiform discharges are seen. Sleep activities observed with K complexes and sleep spindles.   IMPRESSION: This recording is abnormal due to the following: 1. Lateralized right hemispheric slowing indicating a right hemispheric dysfunction. 2. Focal right temporal slowing which can be associated with focal cerebral disturbance and epileptic focus.      Brysen Shankman A. [name], M.D.    Diplomate, Biomedical engineer of Psychiatry and Neurology ( Neurology).

## 2013-06-28 NOTE — Discharge Summary (Signed)
[id number] 

## 2013-06-28 NOTE — Discharge Summary (Deleted)
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 [name], was not quite convinced of this.  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 Dilantin.  There was no further seizure activity.  She was hemodynamically stable.  Her cholesterol was 223.  She had been noncompliant with hypertensive and lipid medicines due to she states financial issues although she has Medicaid.  The patient had physical therapy and did well.  She had a left-sided hemiparesis.  Discussion was had about possible revascularization with Dr. [name], did not feel this was advantageous certainly at present.  Blood pressure is well controlled.  A 2-D echo revealed ejection fraction of 50% to 55%.  No evidence of thrombogenic source from the atrioventricular valves.  She was urged to have smoking cessation, and she will follow up in the office for this counseling.  She was discharged on the following medicines; aspirin 325 p.o. daily, lisinopril 10 mg p.o. daily, Pravachol 40 mg p.o. daily, and Dilantin 100 mg p.o. t.i.d.  She is urged to follow up for smoking cessation counseling, possibly Chantix, and not to smoke.  She understands this and verbalizes that she will comply.     Melvyn Novas, MD     RMD/MEDQ  D:   06/28/2013  T:  06/28/2013  Job:  [id number]

## 2013-06-28 NOTE — Progress Notes (Signed)
Patient ID: [name], female   DOB: Mar 02, 1966, 48 y.o.   MRN: [id number]  [facility] Jalayah Gutridge A. [name], MD     [email or web address]          [name] is an 48 y.o. female.   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 The seizure is undoubtedly due to the patient's old cortical infarct. Review of her scan indicates that she likely does not have a new infarct but the increased signal seen is due to continuous seizures and not an acute infarct. The patient seemed to have responded to the Dilantin. However, given the recurrent seizures, the dose of Keppra will be increased. She'll be switched over to oral medications. Given that she is eager to go home, further adjustments will be made in the outpatient.  2. 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 Risk factors nicotine addiction, hypertension and dyslipidemia. The patient will be restarted on 2 antiplatelet agents. Lipid medication will also be suggested.  3. Likely significant obstructive sleep apnea syndrome. Sleep study will be arranged in the outpatient setting.  4. Obesity.   The patient has had difficulty with her IV line with the IV Dilantin burning and limited infusion. She is very eager to go home. She was kept over overnight so we can get a better control of these twitching and to get an EEG.  GENERAL: This is a pleasant obese lady who is sleeping but easily arousable. She is in no acute distress.  HEENT: Supple. Atraumatic normocephalic. She has crowded posterior space and a large tongue.  ABDOMEN: soft  EXTREMITIES: No edema  BACK: Normal.  SKIN: Normal by inspection.  MENTAL STATUS: Alert and oriented. Speech, language and cognition are generally intact. Judgment and insight normal.  CRANIAL NERVES: Pupils are equal, round and reactive to light and accommodation;  extra ocular movements are full, there is no significant nystagmus; visual fields are full; upper and lower facial muscles are normal in strength and symmetric, there is no flattening of the nasolabial folds; tongue is midline; uvula is midline; shoulder elevation is normal.  MOTOR: She has normal tone, bulk and strength on the right side. LUE The strength is graded as 3/5. Left leg for 4+/5 on hip flexion and dorsiflexion 1-2/5. The twitching on the left side is now interrupted and not continuous as before.  COORDINATION: Left finger to nose is normal, right finger to nose is normal, No rest tremor; no intention tremor; no postural tremor; no bradykinesia. She has continuous twitching of the left upper extremity and left leg. Additionally, there is increased tone especially of the left upper extremity. There is also some twitching of the neck on the left side.  REFLEXES: Deep tendon reflexes are symmetrical and normal.  SENSATION: Normal to light touch.     Objective: Vital signs in last 24 hours: Temp:  [97.8 F (36.6 C)-98.3 F (36.8 C)] 97.8 F (36.6 C) (03/27 0500) Pulse Rate:  [78-118] 78 (03/27 0500) Resp:  [18-20] 18 (03/27 0500) BP: (98-121)/(31-82) 121/82 mmHg (03/27 0500) SpO2:  [91 %-99 %] 97 % (03/27 0500)  Intake/Output from previous day: 03/26 0701 - 03/27 0700 In: 480 [P.O.:480] Out: -  Intake/Output this shift: Total I/O In: 240 [P.O.:240] Out: -  Nutritional status: Dysphagia   Lab Results: Results for orders placed during the hospital encounter of 06/25/13 (from the past 48 hour(s))  GLUCOSE, CAPILLARY     Status: None  Collection Time    06/26/13 11:25 AM      Result Value Ref Range   Glucose-Capillary 89  70 - 99 mg/dL   Comment 1 Notify RN    TSH     Status: Abnormal   Collection Time    06/27/13  6:15 AM      Result Value Ref Range   TSH 4.866 (*) 0.350 - 4.500 uIU/mL   Comment: Performed at [facility]    Lipid Panel  Recent Labs   06/26/13 0607  CHOL 224*  TRIG 129  HDL 49  CHOLHDL 4.6  VLDL 26  LDLCALC 657*    Studies/Results: US Carotid Duplex Bilateral  06/26/2013   CLINICAL DATA:  Right-sided CVA  EXAM: BILATERAL CAROTID DUPLEX ULTRASOUND  TECHNIQUE: Wallace Cullens scale imaging, color Doppler and duplex ultrasound were performed of bilateral carotid and vertebral arteries in the neck.  COMPARISON:  MR HEAD W/O CM dated 06/25/2013; MR HEAD W/O CM dated 04/07/2011  FINDINGS: The examination is degraded due to patient body habitus and difficulty tolerating the examination.  Criteria: Quantification of carotid stenosis is based on velocity parameters that correlate the residual internal carotid diameter with NASCET-based stenosis levels, using the diameter of the distal internal carotid lumen as the denominator for stenosis measurement.  The following velocity measurements were obtained:  RIGHT  ICA:  Occluded  CCA:  74/10 cm/sec  SYSTOLIC ICA/CCA RATIO:  N/A  DIASTOLIC ICA/CCA RATIO:  N/A  ECA:  112 cm/sec  LEFT  ICA:  109/37 cm/sec  CCA:  90/18 cm/sec  SYSTOLIC ICA/CCA RATIO:  1.2  DIASTOLIC ICA/CCA RATIO:  2.1  ECA:  94 cm/sec  RIGHT CAROTID ARTERY: The right internal carotid artery appears occluded shortly after its origin (image 31, 34 and 37).  RIGHT VERTEBRAL ARTERY:  Antegrade flow  LEFT CAROTID ARTERY: There is a minimal amount of eccentric hypoechoic plaque involving the distal aspect of the left common carotid artery (image 53). There is a minimal amount of eccentric hypoechoic plaque within the left carotid bulb (image 57), not resulting in elevated peak systolic velocities within the interrogated course of the left internal carotid artery to suggest a hemodynamically significant stenosis.  LEFT VERTEBRAL ARTERY:  Antegrade flow  IMPRESSION: 1. Degraded examination demonstrates chronic occlusion of the right internal carotid artery (as demonstrated on remote brain MRI performed 04/2011). 2. Minimal amount of left-sided  atherosclerotic plaque, not definitely resulting in a hemodynamically significant stenosis. 3. Preserved antegrade flow demonstrated within the bilateral vertebral arteries.   Electronically Signed   By: Simonne Come M.D.   On: 06/26/2013 16:31   Dg Chest Port 1 View  06/26/2013   CLINICAL DATA:  History of CVA on March 24th  EXAM: PORTABLE CHEST - 1 VIEW  COMPARISON:  None.  FINDINGS: The lungs are well-expanded and clear. The cardiopericardial silhouette is normal in size. The mediastinum is normal in width. There is no pleural effusion. The trachea is midline. The observed portions of the bony thorax exhibit no acute abnormalities.  IMPRESSION: There is no evidence of active cardiopulmonary disease.   Electronically Signed   By: David  [place]   On: 06/26/2013 15:13    Medications:  Scheduled Meds: . aspirin EC  325 mg Oral Daily  . enoxaparin (LOVENOX) injection  40 mg Subcutaneous Q24H  . levETIRAcetam  1,500 mg Oral BID  . lisinopril  10 mg Oral Daily  . phenytoin  100 mg Oral TID  . simvastatin  20 mg Oral  q1800   Continuous Infusions:  PRN Meds:.fentaNYL, LORazepam, senna-docusate     LOS: 3 days   Judah Carchi A. [name], M.D.  Diplomate, Biomedical engineer of Psychiatry and Neurology ( Neurology).

## 2013-06-28 NOTE — Progress Notes (Signed)
AVS reviewed with patient.  Patient verbalized understanding of discharge instructions, physician follow-up and medications.  Prescriptions provided to patient.  Stroke booklet and education provided to patient.  Verbalized understanding.  [facility] to follow patient for [facility] PT and SW services per [facility].  Patient's IV removed.  Site WNL.  Patient transported by NT via w/c to main entrance for discharge.  Patient stable at time of discharge.

## 2013-07-05 ENCOUNTER — Emergency Department (HOSPITAL_COMMUNITY)
Admission: EM | Admit: 2013-07-05 | Discharge: 2013-07-05 | Disposition: A | Discharge status: Home / Self Care | Payer: MEDICAID | Attending: Emergency Medicine | Admitting: Emergency Medicine

## 2013-07-05 ENCOUNTER — Encounter (HOSPITAL_COMMUNITY): Payer: Self-pay | Admitting: Emergency Medicine

## 2013-07-05 DIAGNOSIS — Z79899 Other long term (current) drug therapy: Secondary | ICD-10-CM | POA: Insufficient documentation

## 2013-07-05 DIAGNOSIS — T7840XA Allergy, unspecified, initial encounter: Secondary | ICD-10-CM

## 2013-07-05 DIAGNOSIS — Z8673 Personal history of transient ischemic attack (TIA), and cerebral infarction without residual deficits: Secondary | ICD-10-CM | POA: Insufficient documentation

## 2013-07-05 DIAGNOSIS — R220 Localized swelling, mass and lump, head: Secondary | ICD-10-CM | POA: Insufficient documentation

## 2013-07-05 DIAGNOSIS — T420X5A Adverse effect of hydantoin derivatives, initial encounter: Secondary | ICD-10-CM | POA: Insufficient documentation

## 2013-07-05 DIAGNOSIS — F17200 Nicotine dependence, unspecified, uncomplicated: Secondary | ICD-10-CM | POA: Insufficient documentation

## 2013-07-05 DIAGNOSIS — Z7982 Long term (current) use of aspirin: Secondary | ICD-10-CM | POA: Insufficient documentation

## 2013-07-05 DIAGNOSIS — IMO0002 Reserved for concepts with insufficient information to code with codable children: Secondary | ICD-10-CM | POA: Insufficient documentation

## 2013-07-05 DIAGNOSIS — I10 Essential (primary) hypertension: Secondary | ICD-10-CM | POA: Insufficient documentation

## 2013-07-05 DIAGNOSIS — G40909 Epilepsy, unspecified, not intractable, without status epilepticus: Secondary | ICD-10-CM | POA: Insufficient documentation

## 2013-07-05 DIAGNOSIS — R221 Localized swelling, mass and lump, neck: Principal | ICD-10-CM

## 2013-07-05 DIAGNOSIS — E7800 Pure hypercholesterolemia, unspecified: Secondary | ICD-10-CM | POA: Insufficient documentation

## 2013-07-05 MED ORDER — DIPHENHYDRAMINE HCL 25 MG PO CAPS
50.0000 mg | ORAL_CAPSULE | Freq: Once | ORAL | Status: AC
  Administered 2013-07-05: 50 mg via ORAL
  Filled 2013-07-05: qty 2

## 2013-07-05 MED ORDER — PREDNISONE 50 MG PO TABS
60.0000 mg | ORAL_TABLET | Freq: Once | ORAL | Status: AC
  Administered 2013-07-05: 60 mg via ORAL
  Filled 2013-07-05 (×2): qty 1

## 2013-07-05 MED ORDER — FAMOTIDINE 20 MG PO TABS
40.0000 mg | ORAL_TABLET | Freq: Once | ORAL | Status: AC
  Administered 2013-07-05: 40 mg via ORAL
  Filled 2013-07-05: qty 2

## 2013-07-05 MED ORDER — PREDNISONE 20 MG PO TABS: 40.0000 mg | ORAL_TABLET | Freq: Every day | ORAL | Status: DC

## 2013-07-05 NOTE — ED Notes (Signed)
Pt reports she is ready to leave.  Discharge instructions and script reviewed. Pt verbalized understanding.  Pt did not sign discharge, but no questions verbalized.

## 2013-07-05 NOTE — ED Notes (Signed)
Pt states lips started to swell for the past week after she started taking a new seizure medication. Note that pt does take Lisinopril for HTN. Pt has no difficulty swallowing or eating, sats 100% on RA.

## 2013-07-05 NOTE — Discharge Instructions (Signed)
°Emergency Department Resource Guide °1) Find a Doctor and Pay Out of Pocket °Although you won't have to find out who is covered by your insurance plan, it is a good idea to ask around and get recommendations. You will then need to call the office and see if the doctor you have chosen will accept you as a new patient and what types of options they offer for patients who are self-pay. Some doctors offer discounts or will set up payment plans for their patients who do not have insurance, but you will need to ask so you aren't surprised when you get to your appointment. ° °2) Contact Your Local Health Department °Not all health departments have doctors that can see patients for sick visits, but many do, so it is worth a call to see if yours does. If you don't know where your local health department is, you can check in your phone book. The CDC also has a tool to help you locate your state's health department, and many state websites also have listings of all of their local health departments. ° °3) Find a Walk-in Clinic °If your illness is not likely to be very severe or complicated, you may want to try a walk in clinic. These are popping up all over the country in pharmacies, drugstores, and shopping centers. They're usually staffed by nurse practitioners or physician assistants that have been trained to treat common illnesses and complaints. They're usually fairly quick and inexpensive. However, if you have serious medical issues or chronic medical problems, these are probably not your best option. ° °No Primary Care Doctor: °- Call [facility] at  832-8000 - they can help you locate a primary care doctor that  accepts your insurance, provides certain services, etc. °- Physician Referral Service- 1-800-533-3463 ° °Chronic Pain Problems: °Organization         Address  Phone   Notes  °[facility]  (336) 297-2271 Patients need to be referred by their primary care doctor.  ° °Medication  Assistance: °Organization         Address  Phone   Notes  °[place] [facility] [address] °[place], [place] [address] (336) 641-8030 --Must be a resident of [place] °-- Must have NO insurance coverage whatsoever (no Medicaid/ Medicare, etc.) °-- The pt. MUST have a primary care doctor that directs their care regularly and follows them in the community °  °[facility]  (866) 331-1348   °[employer]  (888) 892-1162   ° °Agencies that provide inexpensive medical care: °Organization         Address  Phone   Notes  °[facility]  (336) 832-8035   °[facility]    (336) 832-7272   °[facility] [address] °[place], [place] [address] (336) 832-4777   °[facility] [address], °[place] (336) 271-4999   °[facility]    (336) 373-0678   °[facility]    (336) 272-1050   °[facility] ° [address], [place] Phone:  (336) 832-4444, Fax:  (336) 832-4440 Hours of Operation:  9 am - 6 pm, M-F.  Also accepts Medicaid/Medicare and self-pay.  °[facility] ° [address], [place] Phone: (336) 832-3150, Fax: (336) 832-3151. Hours of Operation:  8:30 am - 5:30 pm, M-F.  Also accepts Medicaid and self-pay.  °[facility] [place] [address], [place] Phone: (336) 878-6027   °[facility] [address], [place], [place] (336)723-1848, Ext. 123 Mondays & Thursdays: 7-9 AM.  First 15 patients are seen on a first come, first serve basis. °  ° °Medicaid-accepting [place] Providers: ° °Organization         Address  Phone   Notes  °[facility] [address], [place] (336) 641-2100 Also accepts self-pay patients.  °[facility] [address], [place] ° (336) 856-9996   °[facility] [address] [place] [address], [place]  (336) 288-8857   °[facility] [address] [place] [address], [place] (336) 299-7000   °Veita Bland [address], [place]  ° (336) 373-1557 Only accepts [place] Access Medicaid patients after they have their name applied to their card.  ° °Self-Pay (no insurance) in [place]: ° °Organization         Address  Phone   Notes  °Sickle Cell Patients, [facility] [address], [place] (336) 832-1970   °[facility] [address], [place] (336) 832-4400   °[facility] ° [address], [place] (336) 992-4800   °[facility]/Dr. Osei-Bonsu ° [address] [place] [address], [place] or [address], [place] (336) 841-8500 Phone number for both [place] and [place] locations is the same.  °[facility] [address], [place] (336) 299-0000   °[facility] [address] [place] [address], [place] or [address] (336) 852-7530 °(336) 878-2260   °[facility] [address], [place] (336) 350-1642, phone; (336) 294-5005, fax Sees patients 1st and 3rd Saturday of every month.  Must not qualify for public or private insurance (i.e. Medicaid, Medicare, [place] Health Choice, Veterans' Benefits) • Household income should be no more than 200% of the poverty level •The clinic cannot treat you if you are pregnant or think you are pregnant • Sexually transmitted diseases are not treated at the clinic.  ° ° °Dental Care: °Organization         Address  Phone  Notes  °[place] [facility] [address], [place] (336) 641-6152 Accepts children up to age 21 who are enrolled in Medicaid or [place] Health Choice; pregnant women with a Medicaid card; and children who have applied for Medicaid or [place] Health Choice, but were declined, whose parents can pay a reduced fee at time of service.  °[place]  [facility] [place]  [address], [place] (336) 641-7733 Accepts children up to age 21 who are enrolled in Medicaid or [place] Health Choice; pregnant women with a Medicaid card; and children who have applied for Medicaid or [place] Health Choice, but were declined, whose parents can pay a reduced fee at time of service.  °[facility] ° [address], [place] (336) 641-4533 Patients are seen by appointment only. Walk-ins are not accepted. [facility] will see patients 18 years of age and older. °Monday - Tuesday (8am-5pm) °Most Wednesdays (8:30-5pm) °$30 per visit, cash only  °[facility] ° [address], [place] (336) 641-4533 Patients are seen by appointment only. Walk-ins are not accepted. [facility] will see patients 18 years of age and older. °One   Wednesday Evening (Monthly: Volunteer Based).  $30 per visit, cash only  °[facility]  (919) 537-3737 for adults; Children under age 4, call Graduate Pediatric Dentistry at (919) [phone]. Children aged 4-14, please call (919) [phone] to request a pediatric application. ° Dental services are provided in all areas of dental care including fillings, crowns and bridges, complete and partial dentures, implants, gum treatment, root canals, and extractions. Preventive care is also provided. Treatment is provided to both adults and children. °Patients are selected via a lottery and there is often a waiting list. °  °[facility] [address], °[place] ° (336) 763-8833 [email or web address] °  °[facility] [address], [place], [place] (336)723-1848, Ext. 123 Second and Fourth Thursday of each month, opens at 6:30 AM; Clinic ends at 9 AM.  Patients are seen on a first-come first-served basis, and a limited number are seen during each clinic.  ° °[facility] ° [address], [place], [place] (336) 723-7904    Eligibility Requirements °You must have lived in [place], or [place] counties for at least the last three months. °  You cannot be eligible for state or federal sponsored healthcare insurance, including Veterans Administration, Medicaid, or Medicare. °  You generally cannot be eligible for healthcare insurance through your employer.  °  How to apply: °Eligibility screenings are held every Tuesday and Wednesday afternoon from 1:00 pm until 4:00 pm. You do not need an appointment for the interview!  °[facility] [address], [place], [place] 336-631-2330   °[facility]  336-342-8273   °[facility]  336-703-3100   °[facility]  336-570-6415   ° °Behavioral Health Resources in the Community: °Intensive Outpatient Programs °Organization         Address  Phone  Notes  °[facility] [address], [place], [place] 336-878-6098   °[facility] [address], [place], [place] 336-832-9800   °[facility] [address], [place], [place] ° 336-882-2125   °[facility] [address],  °[place], [place] 1-800-853-5163 or 336-641-4981   °Substance Abuse Resources °Organization         Address  Phone  Notes  °[facility]  336-882-2125   °[facility]  336-784-9470   °[facility]  336-285-9073   °[facility]  336-845-3988   °Residential & Outpatient Substance Abuse Program  1-800-659-3381   °Psychological Services °Organization         Address  Phone  Notes  °[facility]  336- 832-9600   °[facility]  336- 378-7881   °[facility] [address], [place] 1-800-853-5163 or 336-641-4981   ° °Mobile Crisis Teams °Organization         Address  Phone  Notes  °[facility], Mobile Crisis Care Unit  1-877-626-1772   °Assertive °[facility] ° [address]  [place], [place] 336-834-9664   °Sharon DeEsch [address] °[place] 336-554-5454   ° °Self-Help/Support Groups °Organization         Address  Phone             Notes  °[facility] - variety of support groups  336- 373-1402 Call for more information  °Narcotics Anonymous (NA), [facility] [address], °[place]  2 meetings at this location  ° °  Residential Treatment Programs °Organization         Address  Phone  Notes  °[facility] [address],    °[place]  1-866-801-8205   °[facility] ° [address], [place], [place] 704-293-8524   °[facility] [address], [place] 336-845-3988 Admissions: 8am-3pm M-F  °[facility] [address]    °[place], [place] 336-841-1104   °[facility] [address], [place], [place] 336-379-7146   °[facility] [address]  °[place], [place] 336-285-9073   °[facility] - Intensive Outpatient [address], [place], [place] 336-852-3033   °[facility] [address]  °[place], [place] 1-877-615-2722 or 336-784-9470   °[facility] [address] [place], [place] 336-227-7417 Accepts Medicaid  °[facility] [address]  °[place] 1-800-659-3381 Substance Abuse/Addiction Treatment  ° °[place] Behavioral Health Resources °Organization         Address  Phone  Notes  °[facility]  (888) 581-9988   °Julie Brannon, PhD [address] [place], [place]   (336) 349-5553 or (336) 951-0000   °[facility]   [address] °[place], [place] (336) 349-4454   °[facility] [address], [place], [place] (336) 342-8316 Insurance/Medicaid/sponsorship through [facility]  °[facility] [address]                                    [place], [place] (336) 342-8316 Therapy/tele-psych/case    °[facility] [address]  ° [place], [place] (336) 349-2233    °Dr. Arfeen  (336) 349-4544   °[facility] [place]  [employer] [place] [facility] 1) [address], [place] °2) [address], [place] °3)  [address], [place] (336) 349-3220 °(336) 342-7768 ° °(336) 342-8140   °[place] [facility] (336) 342-1394 or (336) 342-3537 (After Hours)    ° ° °Take the prescription as directed.  Take over the counter benadryl, as directed on packaging, as needed for itching.  If the benadryl is too sedating, take an over the counter non-sedating antihistamine such as claritin, allegra or zyrtec, as directed on packaging.  Call your regular medical doctor on Monday to schedule a follow up appointment within the next 3 days.  Return to the Emergency Department immediately sooner if worsening.  ° °. °

## 2013-07-05 NOTE — ED Provider Notes (Signed)
CSN: [id number]     Arrival date & time 07/05/13  1707 History   First MD Initiated Contact with Patient 07/05/13 1745     Chief Complaint  Patient presents with  . Allergic Reaction      HPI Pt was seen at 1755. Per pt, c/o gradual onset and persistence of constant upper outer lip edge "swelling" that began 5 days ago. Pt has not taken any OTC's to treat the swelling. States the swelling began shortly after she was started on dilantin for seizures. Denies any change of dose of her usual lisinopril medication. States she has been able to eat and drink without choking/gagging. Denies SOB/wheezing, no dysphagia, no sore throat, no rash, no intra-oral edema.    Past Medical History  Diagnosis Date  . Stroke   . Hypertension   . High cholesterol   . Seizures    Past Surgical History  Procedure Laterality Date  . Dilation and curettage of uterus      History  Substance Use Topics  . Smoking status: Light Tobacco Smoker    Types: Cigarettes  . Smokeless tobacco: Not on file  . Alcohol Use: Yes     Comment: occasional    Review of Systems ROS: Statement: All systems negative except as marked or noted in the HPI; Constitutional: Negative for fever and chills. ; ; Eyes: Negative for eye pain, redness and discharge. ; ; ENMT: Negative for ear pain, hoarseness, nasal congestion, sinus pressure and sore throat. ; ; Cardiovascular: Negative for chest pain, palpitations, diaphoresis, dyspnea and peripheral edema. ; ; Respiratory: Negative for cough, wheezing and stridor. ; ; Gastrointestinal: Negative for nausea, vomiting, diarrhea, abdominal pain, blood in stool, hematemesis, jaundice and rectal bleeding. . ; ; Genitourinary: Negative for dysuria, flank pain and hematuria. ; ; Musculoskeletal:  Negative for back pain and neck pain. Negative for swelling and trauma.; ; Skin: +upper outer lip edge swelling. Negative for pruritus, rash, abrasions, blisters, bruising and skin lesion.; ; Neuro:  Negative for headache, lightheadedness and neck stiffness. Negative for weakness, altered level of consciousness , altered mental status, extremity weakness, paresthesias, involuntary movement, seizure and syncope.      Allergies  Review of patient's allergies indicates no known allergies.  Home Medications   Current Outpatient Rx  Name  Route  Sig  Dispense  Refill  . aspirin EC 81 MG tablet   Oral   Take 324 mg by mouth daily.         Marland Kitchen lisinopril (PRINIVIL,ZESTRIL) 10 MG tablet   Oral   Take 2 tablets (20 mg total) by mouth daily.   30 tablet   2   . phenytoin (DILANTIN) 100 MG ER capsule   Oral   Take 1 capsule (100 mg total) by mouth 3 (three) times daily.         Marland Kitchen levETIRAcetam (KEPPRA) 750 MG tablet   Oral   Take 2 tablets (1,500 mg total) by mouth 2 (two) times daily.         . pravastatin (PRAVACHOL) 40 MG tablet   Oral   Take 1 tablet (40 mg total) by mouth daily.   30 tablet   2   . predniSONE (DELTASONE) 20 MG tablet   Oral   Take 2 tablets (40 mg total) by mouth daily.   10 tablet   0    BP 114/78  Pulse 81  Temp(Src) 98.6 F (37 C) (Oral)  Resp 16  Ht 5\' 6"  (1.676 m)  Wt 250 lb (113.399 kg)  BMI 40.37 kg/m2  SpO2 100%  LMP 07/29/2012 Physical Exam 1800: Physical examination:  Nursing notes reviewed; Vital signs and O2 SAT reviewed;  Constitutional: Well developed, Well nourished, Well hydrated, In no acute distress; Head:  Normocephalic, atraumatic; Eyes: EOMI, PERRL, No scleral icterus; ENMT: TM's clear bilat. +edemetous nasal turbinates bilat with clear rhinorrhea. +upper outer lip edge with mild localized edema. No rash, no open wounds. Mouth and pharynx without lesions. No tonsillar exudates. No intra-oral edema. No submandibular or sublingual edema. No hoarse voice, no drooling, no stridor. No pain with manipulation of larynx. No trismus. Mouth and pharynx normal, Mucous membranes moist; Neck: Supple, Full range of motion, No  lymphadenopathy; Cardiovascular: Regular rate and rhythm, No gallop; Respiratory: Breath sounds clear & equal bilaterally, No rales, rhonchi, wheezes.  Speaking full sentences with ease, Normal respiratory effort/excursion; Chest: Nontender, Movement normal; Abdomen: Soft, Nontender, Nondistended, Normal bowel sounds; Genitourinary: No CVA tenderness; Extremities: Pulses normal, No tenderness, No edema, No calf edema or asymmetry.; Neuro: AA&Ox3, Major CN grossly intact.  Speech clear. Hx left sided weakness per previous CVA, otherwise no new gross focal motor deficits in extremities. Climbs on and off stretcher easily by herself. Gait steady.;; Skin: Color normal, Warm, Dry. No hives.    ED Course  Procedures     EKG Interpretation None      MDM  MDM Reviewed: previous chart, nursing note and vitals     1925:   Meds given PO approx 1 hour ago; upper lip edema improving. No intra-oral edema. Pt states she "has to leave right now" and is starting to leave the ED.  Will continue to tx symptomatically; f/u PMD Monday. Dx and testing d/w pt.  Questions answered.  Verb understanding, agreeable to d/c home with outpt f/u.   Laray Anger, DO 07/08/13 1336

## 2014-04-18 ENCOUNTER — Emergency Department (HOSPITAL_COMMUNITY)
Admission: EM | Admit: 2014-04-18 | Discharge: 2014-04-18 | Disposition: A | Discharge status: Home / Self Care | Payer: MEDICAID | Attending: Emergency Medicine | Admitting: Emergency Medicine

## 2014-04-18 ENCOUNTER — Encounter (HOSPITAL_COMMUNITY): Payer: Self-pay | Admitting: *Deleted

## 2014-04-18 DIAGNOSIS — Z720 Tobacco use: Secondary | ICD-10-CM | POA: Insufficient documentation

## 2014-04-18 DIAGNOSIS — G40909 Epilepsy, unspecified, not intractable, without status epilepticus: Secondary | ICD-10-CM | POA: Insufficient documentation

## 2014-04-18 DIAGNOSIS — M549 Dorsalgia, unspecified: Secondary | ICD-10-CM | POA: Insufficient documentation

## 2014-04-18 DIAGNOSIS — Z7952 Long term (current) use of systemic steroids: Secondary | ICD-10-CM | POA: Insufficient documentation

## 2014-04-18 DIAGNOSIS — R42 Dizziness and giddiness: Secondary | ICD-10-CM | POA: Insufficient documentation

## 2014-04-18 DIAGNOSIS — I10 Essential (primary) hypertension: Secondary | ICD-10-CM | POA: Insufficient documentation

## 2014-04-18 DIAGNOSIS — Z7982 Long term (current) use of aspirin: Secondary | ICD-10-CM | POA: Insufficient documentation

## 2014-04-18 DIAGNOSIS — E780 Pure hypercholesterolemia: Secondary | ICD-10-CM | POA: Insufficient documentation

## 2014-04-18 DIAGNOSIS — Z79899 Other long term (current) drug therapy: Secondary | ICD-10-CM | POA: Insufficient documentation

## 2014-04-18 DIAGNOSIS — Z8673 Personal history of transient ischemic attack (TIA), and cerebral infarction without residual deficits: Secondary | ICD-10-CM | POA: Insufficient documentation

## 2014-04-18 DIAGNOSIS — G8929 Other chronic pain: Secondary | ICD-10-CM | POA: Insufficient documentation

## 2014-04-18 LAB — BASIC METABOLIC PANEL
ANION GAP: 5 (ref 5–15)
BUN: 8 mg/dL (ref 6–23)
CHLORIDE: 108 meq/L (ref 96–112)
CO2: 25 mmol/L (ref 19–32)
Calcium: 9.2 mg/dL (ref 8.4–10.5)
Creatinine, Ser: 0.72 mg/dL (ref 0.50–1.10)
GFR calc non Af Amer: >90 mL/min (ref >90)
GLUCOSE: 84 mg/dL (ref 70–99)
POTASSIUM: 3.6 mmol/L (ref 3.5–5.1)
Sodium: 138 mmol/L (ref 135–145)

## 2014-04-18 LAB — CBC WITH DIFFERENTIAL/PLATELET
BASOS ABS: 0.1 10*3/uL (ref 0.0–0.1)
BASOS PCT: 1 % (ref 0–1)
Eosinophils Absolute: 0.1 10*3/uL (ref 0.0–0.7)
Eosinophils Relative: 1 % (ref 0–5)
HEMATOCRIT: 39.1 % (ref 36.0–46.0)
HEMOGLOBIN: 12.7 g/dL (ref 12.0–15.0)
Lymphocytes Relative: 43 % (ref 12–46)
Lymphs Abs: 2.9 10*3/uL (ref 0.7–4.0)
MCH: 28.3 pg (ref 26.0–34.0)
MCHC: 32.5 g/dL (ref 30.0–36.0)
MCV: 87.3 fL (ref 78.0–100.0)
Monocytes Absolute: 0.4 10*3/uL (ref 0.1–1.0)
Monocytes Relative: 5 % (ref 3–12)
NEUTROS ABS: 3.4 10*3/uL (ref 1.7–7.7)
Neutrophils Relative %: 50 % (ref 43–77)
Platelets: 357 10*3/uL (ref 150–400)
RBC: 4.48 MIL/uL (ref 3.87–5.11)
RDW: 14.1 % (ref 11.5–15.5)
WBC: 6.8 10*3/uL (ref 4.0–10.5)

## 2014-04-18 LAB — CBG MONITORING, ED: GLUCOSE-CAPILLARY: 96 mg/dL (ref 70–99)

## 2014-04-18 NOTE — ED Notes (Signed)
Pt states she was dizzy upon awakening this morning, also co nausea, feels this way when changing position.

## 2014-04-18 NOTE — ED Notes (Signed)
Pt's sister to be called, Burna Mortimer [phone], when DC.

## 2014-04-18 NOTE — ED Provider Notes (Signed)
CSN: [id number]     Arrival date & time 04/18/14  1149 History  This chart was scribed for Elizabeth Camel, MD by Luisa Dago, ED Scribe. This patient was seen in room APA11/[facility]11 and the patient's care was started at 2:38 PM.    Chief Complaint  Patient presents with  . Dizziness   The history is provided by the patient and medical records. No language interpreter was used.   HPI Comments: [name] is a 49 y.o. female with PMhx of stroke, HTN, High cholesterol, and seizures presents to the Emergency Department complaining of a sudden onset of dizziness that started this AM upon waking. She states that after waking up she turned unto her back and she noticed that the room was spinning. Pt states that she tried to get rid of the sensation but the dizziness persisted with every time she turned lasting approximately 1 minute. Currently that symptom has resolved. She admits to having a HA and feeling nauseated. Due the the pt hx of 2 strokes (2013/2015) she has weakness to her left arm with some associated pain. Baseline pain is present, however, what she states is new is the pain to her upper left posterior arm for 1 month. Hurts to move arm. Denies any weakness, emesis, dysuria, cough, hematuria, blood in stool, diarrhea, constipation, numbness, tinnitus, trouble walking, or ear pain.  Past Medical History  Diagnosis Date  . Stroke   . Hypertension   . High cholesterol   . Seizures    Past Surgical History  Procedure Laterality Date  . Dilation and curettage of uterus     History reviewed. No pertinent family history. History  Substance Use Topics  . Smoking status: Light Tobacco Smoker    Types: Cigarettes  . Smokeless tobacco: Not on file  . Alcohol Use: Yes     Comment: occasional   OB History    No data available     Review of Systems  Constitutional: Negative for fever.  HENT: Negative for ear pain and sinus pressure.   Eyes: Negative for visual disturbance.   Respiratory: Negative for cough and shortness of breath.   Cardiovascular: Negative for chest pain.  Gastrointestinal: Positive for nausea. Negative for vomiting, abdominal pain and diarrhea.  Genitourinary: Negative for dysuria and frequency.  Musculoskeletal: Positive for back pain (chronic).  Skin: Negative for rash.  Neurological: Positive for dizziness, weakness (chronic) and headaches.  Psychiatric/Behavioral: Negative for hallucinations.      Allergies  Review of patient's allergies indicates no known allergies.  Home Medications   Prior to Admission medications   Medication Sig Start Date End Date Taking? Authorizing Provider  aspirin EC 81 MG tablet Take 324 mg by mouth daily.    Historical Provider, MD  levETIRAcetam (KEPPRA) 750 MG tablet Take 2 tablets (1,500 mg total) by mouth 2 (two) times daily. 06/28/13   Isabella Stalling, MD  lisinopril (PRINIVIL,ZESTRIL) 10 MG tablet Take 2 tablets (20 mg total) by mouth daily. 06/28/13   Isabella Stalling, MD  phenytoin (DILANTIN) 100 MG ER capsule Take 1 capsule (100 mg total) by mouth 3 (three) times daily. 06/28/13   Isabella Stalling, MD  pravastatin (PRAVACHOL) 40 MG tablet Take 1 tablet (40 mg total) by mouth daily. 06/28/13   Isabella Stalling, MD  predniSONE (DELTASONE) 20 MG tablet Take 2 tablets (40 mg total) by mouth daily. 07/05/13   Samuel Jester, DO   BP 144/83 mmHg  Pulse 87  Temp(Src) 97.5  F (36.4 C) (Oral)  Resp 18  Ht 5\' 6"  (1.676 m)  Wt 250 lb (113.399 kg)  BMI 40.37 kg/m2  SpO2 100%  LMP 07/29/2012  Physical Exam  Constitutional: She is oriented to person, place, and time. She appears well-developed and well-nourished. No distress.  HENT:  Head: Normocephalic and atraumatic.  Right Ear: Tympanic membrane and external ear normal.  Left Ear: Tympanic membrane and external ear normal.  Nose: Nose normal.  Mouth/Throat: Oropharynx is clear and moist. No oropharyngeal exudate.  Eyes: Conjunctivae and  EOM are normal. Pupils are equal, round, and reactive to light.  No nystagmus.   Neck: Neck supple.  Cardiovascular: Normal rate, regular rhythm and normal heart sounds.   Pulmonary/Chest: Effort normal and breath sounds normal. No respiratory distress.  Abdominal: She exhibits no distension. There is no tenderness.  Neurological: She is alert and oriented to person, place, and time.  Cranial nerves 2-12 grossly intact. Left upper and lower extremity weakness. Normal finger to nose on right. Unable to do left due to weakness Able to ambulate at her baseline with dragging left leg like normal. No swaying or instability.   Skin: Skin is warm and dry.  Psychiatric: She has a normal mood and affect. Her behavior is normal.  Nursing note and vitals reviewed.   ED Course  Procedures (including critical care time)  DIAGNOSTIC STUDIES: Oxygen Saturation is 100% on RA, normal by my interpretation.    COORDINATION OF CARE: 2:47 PM- Pt advised of plan for treatment and pt agrees.  Labs Review Labs Reviewed  BASIC METABOLIC PANEL  CBC WITH DIFFERENTIAL  CBG MONITORING, ED    Imaging Review No results found.   EKG Interpretation   Date/Time:  Friday April 18 2014 15:18:11 EST Ventricular Rate:  82 PR Interval:  163 QRS Duration: 83 QT Interval:  381 QTC Calculation: 445 R Axis:   35 Text Interpretation:  Sinus rhythm Borderline T abnormalities, diffuse  leads T wave abnormalities new since Mar 2015 Confirmed by Aamirah Salmi  MD,  Emanie Behan ([id number]) on 04/18/2014 3:36:40 PM      MDM   Final diagnoses:  Vertigo    Patient with transient vertigo symptoms x 1 hour. Only occurred with changing head direction/moving. Likely BPPV, but with no symptoms now difficult to be sure. Unlikely to be CVA given symptoms resolved. Labwork unremarkable. Left arm pain appears MSK, is chronic for over 1 month. EKG shows nonspecific T waves but no chest pain, dyspnea or pressure. Feel she is stable for  discharge with f/u with PCP.  I personally performed the services described in this documentation, which was scribed in my presence. The recorded information has been reviewed and is accurate.    04/20/2014, MD 04/18/14 [phone]

## 2014-04-18 NOTE — ED Notes (Signed)
Gave patient graham crackers and sprite as requested and approved by MD.

## 2014-04-18 NOTE — Discharge Instructions (Signed)
Benign Positional Vertigo °Vertigo means you feel like you or your surroundings are moving when they are not. Benign positional vertigo is the most common form of vertigo. Benign means that the cause of your condition is not serious. Benign positional vertigo is more common in older adults. °CAUSES  °Benign positional vertigo is the result of an upset in the labyrinth system. This is an area in the middle ear that helps control your balance. This may be caused by a viral infection, head injury, or repetitive motion. However, often no specific cause is found. °SYMPTOMS  °Symptoms of benign positional vertigo occur when you move your head or eyes in different directions. Some of the symptoms may include: °· Loss of balance and falls. °· Vomiting. °· Blurred vision. °· Dizziness. °· Nausea. °· Involuntary eye movements (nystagmus). °DIAGNOSIS  °Benign positional vertigo is usually diagnosed by physical exam. If the specific cause of your benign positional vertigo is unknown, your caregiver may perform imaging tests, such as magnetic resonance imaging (MRI) or computed tomography (CT). °TREATMENT  °Your caregiver may recommend movements or procedures to correct the benign positional vertigo. Medicines such as meclizine, benzodiazepines, and medicines for nausea may be used to treat your symptoms. In rare cases, if your symptoms are caused by certain conditions that affect the inner ear, you may need surgery. °HOME CARE INSTRUCTIONS  °· Follow your caregiver's instructions. °· Move slowly. Do not make sudden body or head movements. °· Avoid driving. °· Avoid operating heavy machinery. °· Avoid performing any tasks that would be dangerous to you or others during a vertigo episode. °· Drink enough fluids to keep your urine clear or pale yellow. °SEEK IMMEDIATE MEDICAL CARE IF:  °· You develop problems with walking, weakness, numbness, or using your arms, hands, or legs. °· You have difficulty speaking. °· You develop  severe headaches. °· Your nausea or vomiting continues or gets worse. °· You develop visual changes. °· Your family or friends notice any behavioral changes. °· Your condition gets worse. °· You have a fever. °· You develop a stiff neck or sensitivity to light. °MAKE SURE YOU:  °· Understand these instructions. °· Will watch your condition. °· Will get help right away if you are not doing well or get worse. °Document Released: 12/27/2005 Document Revised: 06/13/2011 Document Reviewed: 12/09/2010 °[employer]® Patient Information ©2015 [employer]. This information is not intended to replace advice given to you by your health care provider. Make sure you discuss any questions you have with your health care provider. ° °Dizziness °Dizziness is a common problem. It is a feeling of unsteadiness or light-headedness. You may feel like you are about to faint. Dizziness can lead to injury if you stumble or fall. A person of any age group can suffer from dizziness, but dizziness is more common in older adults. °CAUSES  °Dizziness can be caused by many different things, including: °· Middle ear problems. °· Standing for too long. °· Infections. °· An allergic reaction. °· Aging. °· An emotional response to something, such as the sight of blood. °· Side effects of medicines. °· Tiredness. °· Problems with circulation or blood pressure. °· Excessive use of alcohol or medicines, or illegal drug use. °· Breathing too fast (hyperventilation). °· An irregular heart rhythm (arrhythmia). °· A low red blood cell count (anemia). °· Pregnancy. °· Vomiting, diarrhea, fever, or other illnesses that cause body fluid loss (dehydration). °· Diseases or conditions such as Parkinson's disease, high blood pressure (hypertension), diabetes, and thyroid problems. °·   Exposure to extreme heat. °DIAGNOSIS  °Your health care provider will ask about your symptoms, perform a physical exam, and perform an electrocardiogram (ECG) to record the electrical  activity of your heart. Your health care provider may also perform other heart or blood tests to determine the cause of your dizziness. These may include: °· Transthoracic echocardiogram (TTE). During echocardiography, sound waves are used to evaluate how blood flows through your heart. °· Transesophageal echocardiogram (TEE). °· Cardiac monitoring. This allows your health care provider to monitor your heart rate and rhythm in real time. °· Holter monitor. This is a portable device that records your heartbeat and can help diagnose heart arrhythmias. It allows your health care provider to track your heart activity for several days if needed. °· Stress tests by exercise or by giving medicine that makes the heart beat faster. °TREATMENT  °Treatment of dizziness depends on the cause of your symptoms and can vary greatly. °HOME CARE INSTRUCTIONS  °· Drink enough fluids to keep your urine clear or pale yellow. This is especially important in very hot weather. In older adults, it is also important in cold weather. °· Take your medicine exactly as directed if your dizziness is caused by medicines. When taking blood pressure medicines, it is especially important to get up slowly. °¨ Rise slowly from chairs and steady yourself until you feel okay. °¨ In the morning, first sit up on the side of the bed. When you feel okay, stand slowly while holding onto something until you know your balance is fine. °· Move your legs often if you need to stand in one place for a long time. Tighten and relax your muscles in your legs while standing. °· Have someone stay with you for 1-2 days if dizziness continues to be a problem. Do this until you feel you are well enough to stay alone. Have the person call your health care provider if he or she notices changes in you that are concerning. °· Do not drive or use heavy machinery if you feel dizzy. °· Do not drink alcohol. °SEEK IMMEDIATE MEDICAL CARE IF:  °· Your dizziness or light-headedness  gets worse. °· You feel nauseous or vomit. °· You have problems talking, walking, or using your arms, hands, or legs. °· You feel weak. °· You are not thinking clearly or you have trouble forming sentences. It may take a friend or family member to notice this. °· You have chest pain, abdominal pain, shortness of breath, or sweating. °· Your vision changes. °· You notice any bleeding. °· You have side effects from medicine that seems to be getting worse rather than better. °MAKE SURE YOU:  °· Understand these instructions. °· Will watch your condition. °· Will get help right away if you are not doing well or get worse. °Document Released: 09/14/2000 Document Revised: 03/26/2013 Document Reviewed: 10/08/2010 °[employer]® Patient Information ©2015 [employer]. This information is not intended to replace advice given to you by your health care provider. Make sure you discuss any questions you have with your health care provider. ° °

## 2014-11-05 IMAGING — MG MM DIGITAL DIAGNOSTIC BILAT
7 of 12 series · 7 of 12 positions shown · non-contrast
Comparison: None

CLINICAL DATA: Patient presents with history of prior reported
palpable abnormality within the periareolar right breast.  She
states that in the prior week she had one episode of yellow nipple
discharge.  She states this has not occurred since and she can not
longer palpate any masses in the periareolar right breast.

DIGITAL DIAGNOSTIC BILATERAL MAMMOGRAM CAD AND BILATERAL BREAST
ULTRASOUND:

[L CC]
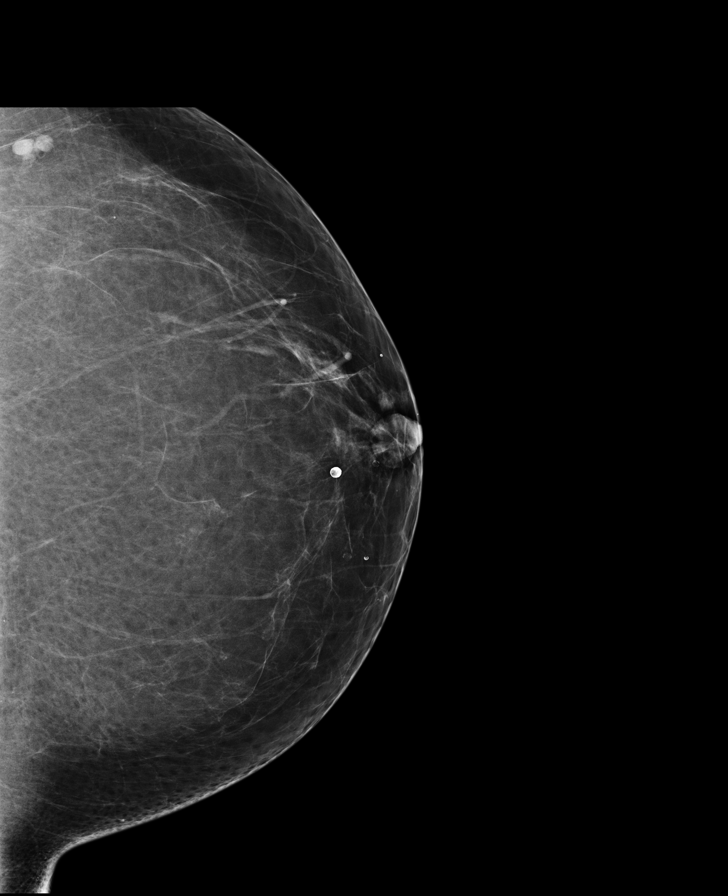

[L MLO]
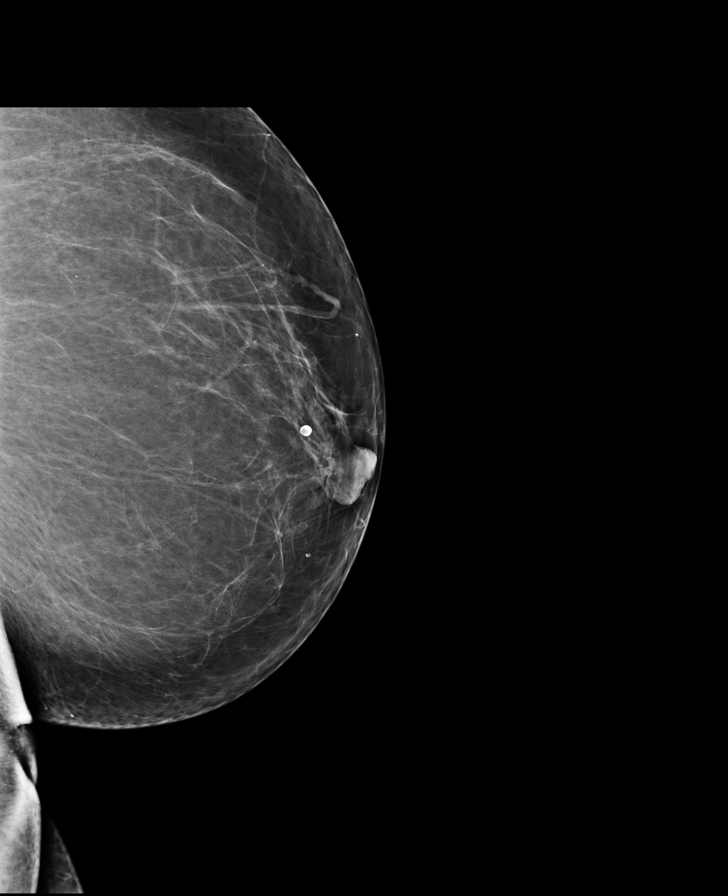

[R CC (1 of 2)]
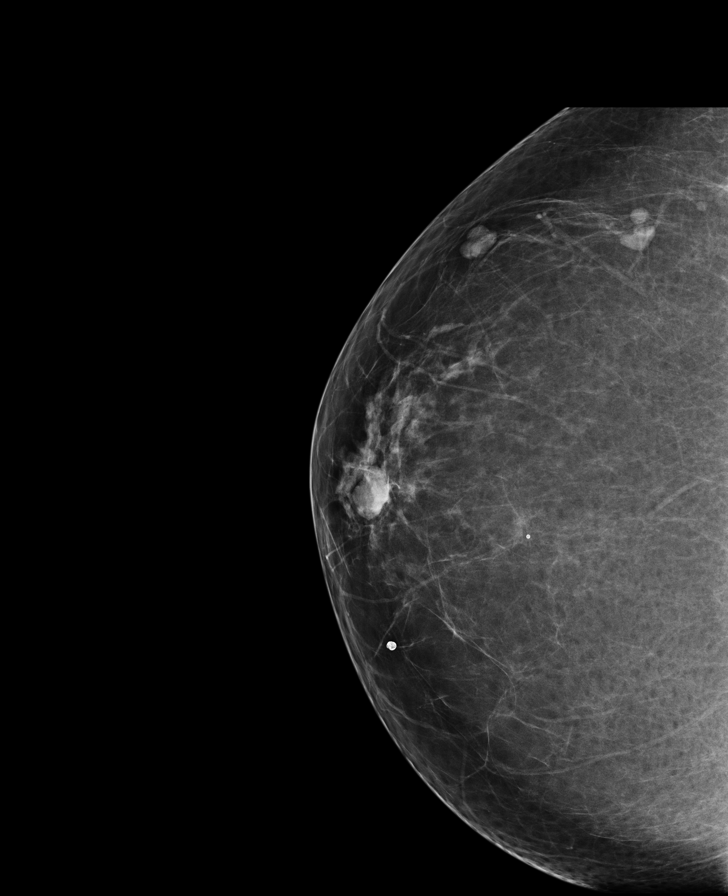

[R MLO (1 of 2)]
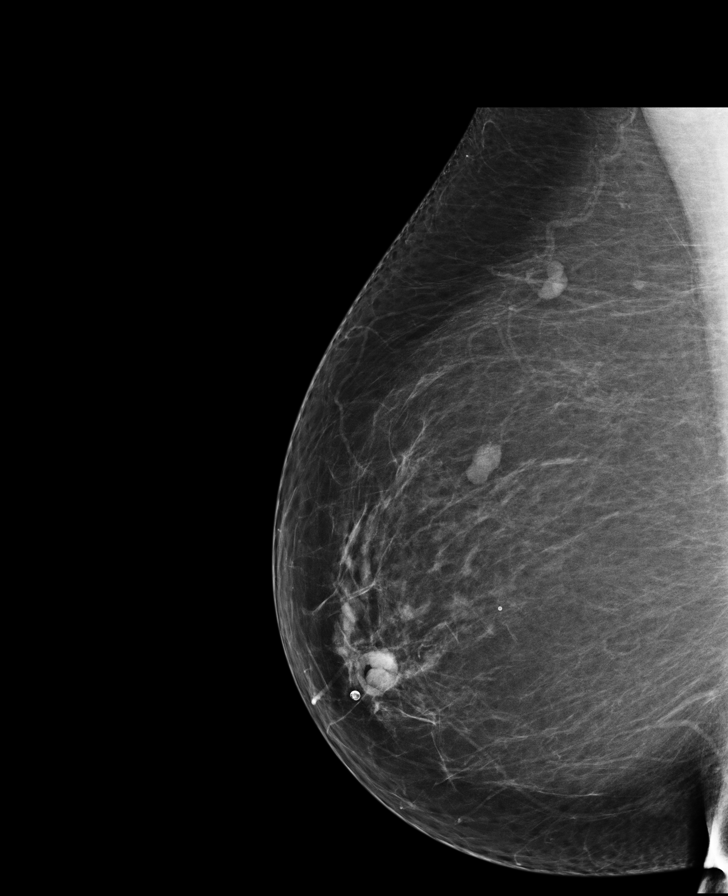

[R CC (2 of 2)]
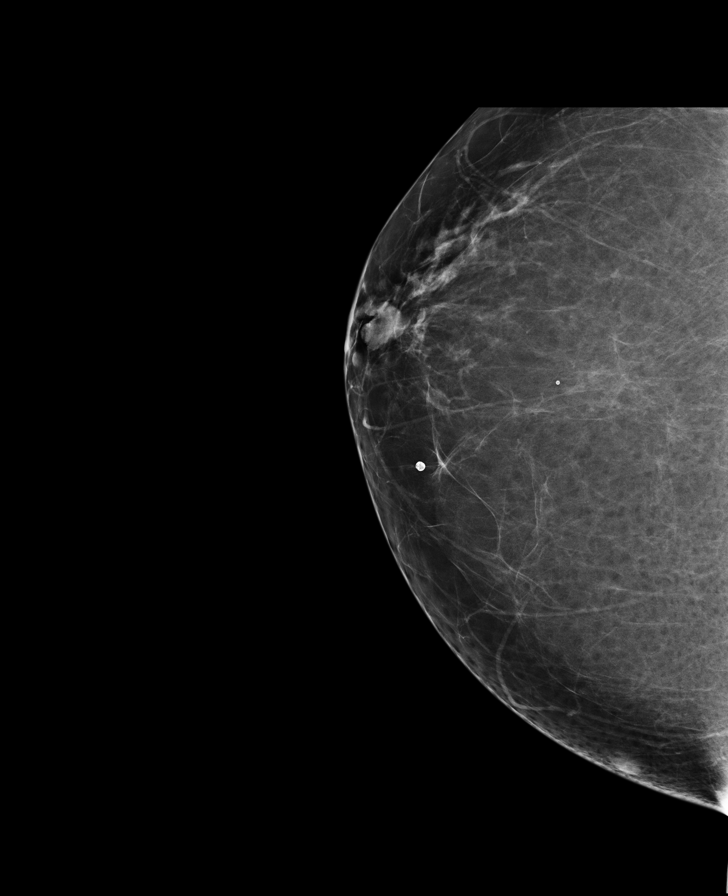

[R MLO (2 of 2)]
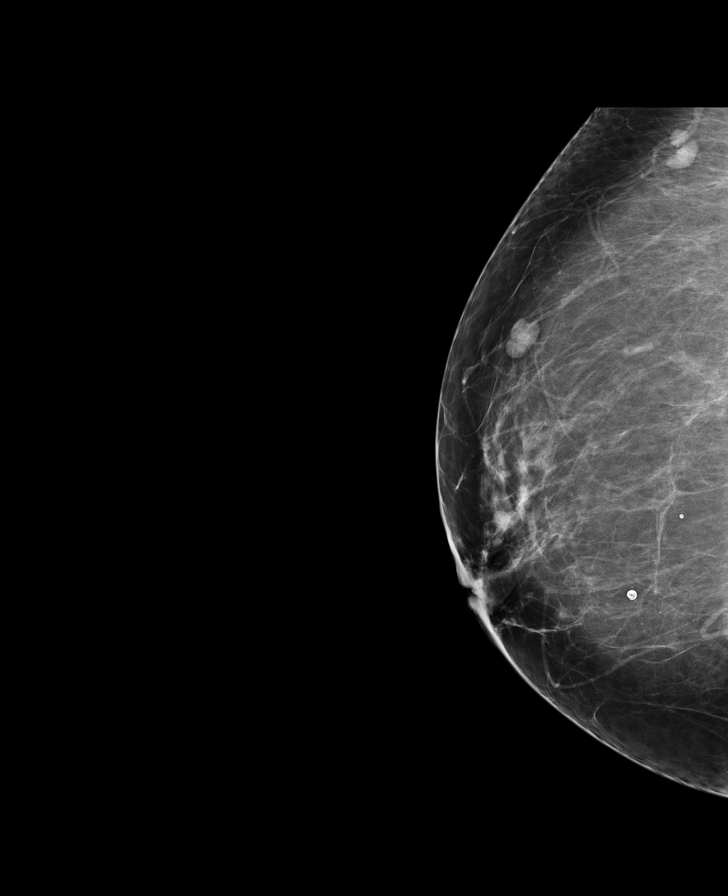

[R TAN]
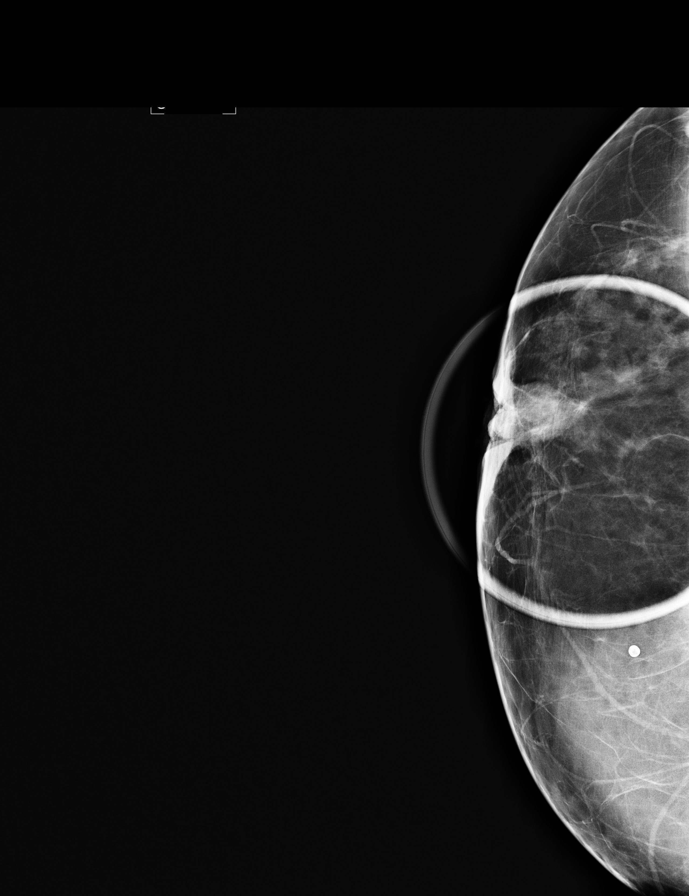

[7 of 12 positions shown; findings below may reference images not displayed]

FINDINGS: ACR Breast Density Category a:  The breast tissue is almost
entirely fatty.

The right nipple is inverted.  No definite subareolar mass is
identified on mammogram.  This area will be further evaluated with
ultrasound.

Within the upper-outer quadrant of the right breast there are two
oval circumscribed masses with suggestion of internal fat density,
potentially representing intramammary lymph nodes.  These will be
further evaluated ultrasound.

Within the upper-outer quadrant of the left breast posterior depth
there is an oval circumscribed mass with internal fat density,
potentially representing an intramammary lymph node.  This will be
further evaluated with ultrasound.

Mammographic images were processed with CAD.

On physical exam, I palpate no discrete mass within either breast.
The patients right nipple is inverted.  The patient states the
right nipple has been inverted for as long as she can remember and
this is not a new change.

Ultrasound is performed, showing an inverted right nipple without
definite concerning subareolar mass.

Within the right breast 11 o'clock position 13 cm from the nipple
there is a 0.8 x 0.9 x 0.3 cm oval circumscribed mass with a
suggestion of an echogenic hilum, potentially representing an
intramammary lymph node.

Within the right breast 10 o'clock position 7 cm from the nipple
there is a 1.2 x 0.8 x 0.8 cm circumscribed hypoechoic mass with
suggestion of echogenic hilum, likely representing an intramammary
lymph node.  Of note the cortex is thickened measuring up to 5 mm.

Within the left breast 2 o'clock position 10 cm from the nipple
there is a 1.0 x 0.5 x 1.1 cm oval circumscribed hypoechoic mass
with suggestion of an echogenic hilum, likely representing an
intramammary lymph node.
IMPRESSION: 1.  Bilateral breast masses as above, likely representing
intramammary lymph nodes.  As there are no prior examinations for
comparison and there is thickened cortices involving these nodes,
an ultrasound guided core biopsy of the right breast lymph node at
the 10 o'clock position is recommended.  If this demonstrates
benign pathology, the additional right breast mass and left breast
mass can be followed in 6 months to ensure stability.
2.  As the patient reports remote history of right nipple discharge
which was not present on today's examination, she was instructed to
return for further evaluation if this returns.
3.  Attention to the subareolar location of the right breast on
right breast follow-up mammogram in 6 months.

RECOMMENDATION:
Ultrasound-guided core needle biopsy of the thickened probable
intramammary lymph node within the right breast 10 o'clock
position.  If this demonstrates benign pathology, bilateral
diagnostic mammography and ultrasound in 6 months is recommended to
ensure stability of the additional probably benign breast masses.

No definite discrete mass is identified in the subareolar location
of the right breast, attention to this location on diagnostic
mammography and possible ultrasound in 6 months.

The patients procedure will be scheduled  at her convenience as she
was securing paperwork for funding.

I have discussed the findings and recommendations with the [facility]

## 2014-11-07 ENCOUNTER — Emergency Department (HOSPITAL_COMMUNITY): Payer: PRIVATE HEALTH INSURANCE

## 2014-11-07 ENCOUNTER — Encounter (HOSPITAL_COMMUNITY): Payer: Self-pay

## 2014-11-07 ENCOUNTER — Emergency Department (HOSPITAL_COMMUNITY)
Admission: EM | Admit: 2014-11-07 | Discharge: 2014-11-07 | Disposition: A | Discharge status: Home / Self Care | Payer: PRIVATE HEALTH INSURANCE | Attending: Emergency Medicine | Admitting: Emergency Medicine

## 2014-11-07 DIAGNOSIS — Z7982 Long term (current) use of aspirin: Secondary | ICD-10-CM | POA: Insufficient documentation

## 2014-11-07 DIAGNOSIS — G40909 Epilepsy, unspecified, not intractable, without status epilepticus: Secondary | ICD-10-CM | POA: Insufficient documentation

## 2014-11-07 DIAGNOSIS — Y9389 Activity, other specified: Secondary | ICD-10-CM | POA: Insufficient documentation

## 2014-11-07 DIAGNOSIS — E669 Obesity, unspecified: Secondary | ICD-10-CM | POA: Insufficient documentation

## 2014-11-07 DIAGNOSIS — S0181XA Laceration without foreign body of other part of head, initial encounter: Secondary | ICD-10-CM

## 2014-11-07 DIAGNOSIS — Y9289 Other specified places as the place of occurrence of the external cause: Secondary | ICD-10-CM | POA: Insufficient documentation

## 2014-11-07 DIAGNOSIS — Z79899 Other long term (current) drug therapy: Secondary | ICD-10-CM | POA: Insufficient documentation

## 2014-11-07 DIAGNOSIS — E780 Pure hypercholesterolemia: Secondary | ICD-10-CM | POA: Insufficient documentation

## 2014-11-07 DIAGNOSIS — S01411A Laceration without foreign body of right cheek and temporomandibular area, initial encounter: Secondary | ICD-10-CM | POA: Insufficient documentation

## 2014-11-07 DIAGNOSIS — Y998 Other external cause status: Secondary | ICD-10-CM | POA: Insufficient documentation

## 2014-11-07 DIAGNOSIS — W01198A Fall on same level from slipping, tripping and stumbling with subsequent striking against other object, initial encounter: Secondary | ICD-10-CM | POA: Insufficient documentation

## 2014-11-07 DIAGNOSIS — I10 Essential (primary) hypertension: Secondary | ICD-10-CM | POA: Insufficient documentation

## 2014-11-07 DIAGNOSIS — Z8673 Personal history of transient ischemic attack (TIA), and cerebral infarction without residual deficits: Secondary | ICD-10-CM | POA: Insufficient documentation

## 2014-11-07 DIAGNOSIS — Z720 Tobacco use: Secondary | ICD-10-CM | POA: Insufficient documentation

## 2014-11-07 MED ORDER — IBUPROFEN 400 MG PO TABS: 400.0000 mg | ORAL_TABLET | Freq: Four times a day (QID) | ORAL | Status: DC | PRN

## 2014-11-07 MED ORDER — POVIDONE-IODINE 10 % EX SOLN
CUTANEOUS | Status: AC
  Filled 2014-11-07: qty 118

## 2014-11-07 MED ORDER — LIDOCAINE-EPINEPHRINE (PF) 1 %-1:200000 IJ SOLN
20.0000 mL | Freq: Once | INTRAMUSCULAR | Status: AC
  Administered 2014-11-07: 20 mL
  Filled 2014-11-07: qty 10

## 2014-11-07 MED ORDER — BACITRACIN ZINC 500 UNIT/GM EX OINT
TOPICAL_OINTMENT | CUTANEOUS | Status: AC
  Filled 2014-11-07: qty 0.9

## 2014-11-07 NOTE — ED Notes (Signed)
Pt alert & oriented x4. Patient  given discharge instructions, paperwork & prescription(s). Patient verbalized understanding. Pt left department in wheel chair w/ no further questions. 

## 2014-11-07 NOTE — ED Provider Notes (Signed)
CSN: [id number]     Arrival date & time 11/07/14  0146 History   First MD Initiated Contact with Patient 11/07/14 0157     Chief Complaint  Patient presents with  . Facial Laceration     (Consider location/radiation/quality/duration/timing/severity/associated sxs/prior Treatment) HPI  This is a 49 year old female with history of stroke, hypertension, seizure disorder who presents following a fall. Patient reports that at baseline she has weakness on her left side secondary to prior strokes. She states that her left ankle turned underneath her and she fell hitting her face on a brick. This happened approximately 2 hours prior to arrival. She denies loss of consciousness. She does take aspirin daily. She noted a wound her right cheek. She denies any other injury or pain. She reports her tetanus is up-to-date. She reports alcohol use tonight.  Past Medical History  Diagnosis Date  . Stroke   . Hypertension   . High cholesterol   . Seizures    Past Surgical History  Procedure Laterality Date  . Dilation and curettage of uterus     No family history on file. History  Substance Use Topics  . Smoking status: Light Tobacco Smoker    Types: Cigarettes  . Smokeless tobacco: Not on file  . Alcohol Use: Yes     Comment: occasional   OB History    No data available     Review of Systems  Constitutional: Negative for fever.  Respiratory: Negative for chest tightness and shortness of breath.   Cardiovascular: Negative for chest pain.  Gastrointestinal: Negative for nausea and vomiting.  Genitourinary: Negative for dysuria.  Musculoskeletal: Negative for back pain and neck pain.  Skin: Positive for wound.  Neurological: Negative for dizziness, seizures, syncope and headaches.  Psychiatric/Behavioral: Negative for confusion.  All other systems reviewed and are negative.     Allergies  Review of patient's allergies indicates no known allergies.  Home Medications   Prior to  Admission medications   Medication Sig Start Date End Date Taking? Authorizing Provider  aspirin EC 81 MG tablet Take 81 mg by mouth daily.    Yes Historical Provider, MD  levETIRAcetam (KEPPRA) 750 MG tablet Take 2 tablets (1,500 mg total) by mouth 2 (two) times daily. 06/28/13  Yes Oval Linsey, MD  lisinopril (PRINIVIL,ZESTRIL) 10 MG tablet Take 2 tablets (20 mg total) by mouth daily. 06/28/13  Yes Oval Linsey, MD  phenytoin (DILANTIN) 100 MG ER capsule Take 1 capsule (100 mg total) by mouth 3 (three) times daily. 06/28/13  Yes Oval Linsey, MD  pravastatin (PRAVACHOL) 40 MG tablet Take 1 tablet (40 mg total) by mouth daily. 06/28/13  Yes Oval Linsey, MD  ibuprofen (ADVIL,MOTRIN) 400 MG tablet Take 1 tablet (400 mg total) by mouth every 6 (six) hours as needed. 11/07/14   Shon Baton, MD  predniSONE (DELTASONE) 20 MG tablet Take 2 tablets (40 mg total) by mouth daily. Patient not taking: Reported on 04/18/2014 07/05/13   Samuel Jester, DO   BP 112/74 mmHg  Pulse 79  Temp(Src) 98.3 F (36.8 C) (Oral)  Resp 18  Ht 5\' 6"  (1.676 m)  Wt 294 lb (133.358 kg)  BMI 47.48 kg/m2  SpO2 90%  LMP 07/29/2012 Physical Exam  Constitutional: She is oriented to person, place, and time. She appears well-developed and well-nourished.  Obese  HENT:  Head: Normocephalic.  Mouth/Throat: Oropharynx is clear and moist.  5 cm V-shaped laceration over the right cheek, gaping, hemostatic, no midface tenderness  Eyes: Conjunctivae  and EOM are normal. Pupils are equal, round, and reactive to light.  Neck: Normal range of motion. Neck supple.  Cardiovascular: Normal rate, regular rhythm and normal heart sounds.   No murmur heard. Pulmonary/Chest: Effort normal and breath sounds normal. No respiratory distress. She has no wheezes.  Abdominal: Soft. Bowel sounds are normal.  Musculoskeletal:  No other obvious deformities  Neurological: She is alert and oriented to person, place, and time.   Baseline left upper and lower extremity weakness  Skin: Skin is warm and dry.  Psychiatric: She has a normal mood and affect.  Nursing note and vitals reviewed.   ED Course  Procedures (including critical care time)  LACERATION REPAIR Performed by: Shon Baton Authorized by: Shon Baton Consent: Verbal consent obtained. Risks and benefits: risks, benefits and alternatives were discussed Consent given by: patient Patient identity confirmed: provided demographic data Prepped and Draped in normal sterile fashion Wound explored  Laceration Location: Face  Laceration Length: 5cm  No Foreign Bodies seen or palpated  Anesthesia: local infiltration  Local anesthetic: lidocaine 1% w epinephrine  Anesthetic total: 3 ml  Irrigation method: syringe Amount of cleaning: standard  Skin closure: 4-0 vicryl, 5-0 fast absorbing gut  Number of sutures: 2 deep, 11 skin  Technique: interrupted  Patient tolerance: Patient tolerated the procedure well with no immediate complications.  Labs Review Labs Reviewed - No data to display  Imaging Review Ct Head Wo Contrast  11/07/2014   CLINICAL DATA:  Larey Seat this morning, striking the right face  EXAM: CT HEAD WITHOUT CONTRAST  CT MAXILLOFACIAL WITHOUT CONTRAST  TECHNIQUE: Multidetector CT imaging of the head and maxillofacial structures were performed using the standard protocol without intravenous contrast. Multiplanar CT image reconstructions of the maxillofacial structures were also generated.  COMPARISON:  06/25/2013  FINDINGS: CT HEAD FINDINGS  There is no intracranial hemorrhage or extra-axial fluid collection. There is encephalomalacia due to remote hemispheric infarction involving right MCA and ACA territories. Left cerebral hemisphere is remarkable only for mild atrophy. Brainstem and posterior fossa appear unremarkable. No significant interval change is evident from 06/25/2013. Skull base and calvarium are intact.  CT  MAXILLOFACIAL FINDINGS  There is a soft tissue laceration overlying the anterior right zygomatic arch. There is no maxillofacial fracture. Orbits are intact. Ocular globes are intact.  IMPRESSION: 1. Negative for acute intracranial traumatic injury. 2. Remote right sided hemispheric cerebral infarction. No acute intracranial findings. 3. Negative for maxillofacial fracture   Electronically Signed   By: Ellery Plunk M.D.   On: 11/07/2014 02:50   Ct Maxillofacial Wo Cm  11/07/2014   CLINICAL DATA:  Larey Seat this morning, striking the right face  EXAM: CT HEAD WITHOUT CONTRAST  CT MAXILLOFACIAL WITHOUT CONTRAST  TECHNIQUE: Multidetector CT imaging of the head and maxillofacial structures were performed using the standard protocol without intravenous contrast. Multiplanar CT image reconstructions of the maxillofacial structures were also generated.  COMPARISON:  06/25/2013  FINDINGS: CT HEAD FINDINGS  There is no intracranial hemorrhage or extra-axial fluid collection. There is encephalomalacia due to remote hemispheric infarction involving right MCA and ACA territories. Left cerebral hemisphere is remarkable only for mild atrophy. Brainstem and posterior fossa appear unremarkable. No significant interval change is evident from 06/25/2013. Skull base and calvarium are intact.  CT MAXILLOFACIAL FINDINGS  There is a soft tissue laceration overlying the anterior right zygomatic arch. There is no maxillofacial fracture. Orbits are intact. Ocular globes are intact.  IMPRESSION: 1. Negative for acute intracranial traumatic injury.  2. Remote right sided hemispheric cerebral infarction. No acute intracranial findings. 3. Negative for maxillofacial fracture   Electronically Signed   By: Ellery Plunk M.D.   On: 11/07/2014 02:50     EKG Interpretation None      MDM   Final diagnoses:  Facial laceration, initial encounter   Patient presents with a facial laceration following a mechanical fall. Does report EtOH  tonight. History of stroke with left-sided residual deficits. Should walk with a walker but did not use walker tonight. She has a laceration over her right cheek. No foreign bodies palpated. CT scan negative. Laceration repaired at the bedside. Patient instructed to use liberal bacitracin for wound healing. Once healed, patient instructed to use sunscreen to prevent scarring.  After history, exam, and medical workup I feel the patient has been appropriately medically screened and is safe for discharge home. Pertinent diagnoses were discussed with the patient. Patient was given return precautions.     Shon Baton, MD 11/07/14 [phone]

## 2014-11-07 NOTE — ED Notes (Signed)
Pt states she tripped on a rock last night and fell forward hitting the right side of her face on a brick.  Pt denies loc, states she was drinking.

## 2014-11-07 NOTE — ED Notes (Signed)
   11/07/14 0207  LOC Related to Mechanism of Injuy  Responsiveness Alert  Reported initial GCS 15  Mechanism Of Injury  Subjective Pt states fell hitting face on rock. avulsion to the right check.  Fall/Jump  Fall/Jump Yes  Object Landed upon Other (Comment) (Rock)  Avulsion to the right cheek. Pt admits to drinking a beer tonight. Pt states she is up to date on her tetanus.

## 2014-11-07 NOTE — Discharge Instructions (Signed)
You were seen today for a facial laceration. You do not have any broken bones. You need to keep the laceration protected with a light bandage and bacitracin ointment. The sutures will absorb on their own.  Facial Laceration  A facial laceration is a cut on the face. These injuries can be painful and cause bleeding. Lacerations usually heal quickly, but they need special care to reduce scarring. DIAGNOSIS  Your health care provider will take a medical history, ask for details about how the injury occurred, and examine the wound to determine how deep the cut is. TREATMENT  Some facial lacerations may not require closure. Others may not be able to be closed because of an increased risk of infection. The risk of infection and the chance for successful closure will depend on various factors, including the amount of time since the injury occurred. The wound may be cleaned to help prevent infection. If closure is appropriate, pain medicines may be given if needed. Your health care provider will use stitches (sutures), wound glue (adhesive), or skin adhesive strips to repair the laceration. These tools bring the skin edges together to allow for faster healing and a better cosmetic outcome. If needed, you may also be given a tetanus shot. HOME CARE INSTRUCTIONS  Only take over-the-counter or prescription medicines as directed by your health care provider.  Follow your health care provider's instructions for wound care. These instructions will vary depending on the technique used for closing the wound. For Sutures:  Keep the wound clean and dry.   If you were given a bandage (dressing), you should change it at least once a day. Also change the dressing if it becomes wet or dirty, or as directed by your health care provider.   Wash the wound with soap and water 2 times a day. Rinse the wound off with water to remove all soap. Pat the wound dry with a clean towel.   After cleaning, apply a thin layer of  the antibiotic ointment recommended by your health care provider. This will help prevent infection and keep the dressing from sticking.   You may shower as usual after the first 24 hours. Do not soak the wound in water until the sutures are removed.   Get your sutures removed as directed by your health care provider. With facial lacerations, sutures should usually be taken out after 4-5 days to avoid stitch marks.   Wait a few days after your sutures are removed before applying any makeup. For Skin Adhesive Strips:  Keep the wound clean and dry.   Do not get the skin adhesive strips wet. You may bathe carefully, using caution to keep the wound dry.   If the wound gets wet, pat it dry with a clean towel.   Skin adhesive strips will fall off on their own. You may trim the strips as the wound heals. Do not remove skin adhesive strips that are still stuck to the wound. They will fall off in time.  For Wound Adhesive:  You may briefly wet your wound in the shower or bath. Do not soak or scrub the wound. Do not swim. Avoid periods of heavy sweating until the skin adhesive has fallen off on its own. After showering or bathing, gently pat the wound dry with a clean towel.   Do not apply liquid medicine, cream medicine, ointment medicine, or makeup to your wound while the skin adhesive is in place. This may loosen the film before your wound is healed.  If a dressing is placed over the wound, be careful not to apply tape directly over the skin adhesive. This may cause the adhesive to be pulled off before the wound is healed.   Avoid prolonged exposure to sunlight or tanning lamps while the skin adhesive is in place.  The skin adhesive will usually remain in place for 5-10 days, then naturally fall off the skin. Do not pick at the adhesive film.  After Healing: Once the wound has healed, cover the wound with sunscreen during the day for 1 full year. This can help minimize scarring.  Exposure to ultraviolet light in the first year will darken the scar. It can take 1-2 years for the scar to lose its redness and to heal completely.  SEEK IMMEDIATE MEDICAL CARE IF:  You have redness, pain, or swelling around the wound.   You see ayellowish-white fluid (pus) coming from the wound.   You have chills or a fever.  MAKE SURE YOU:  Understand these instructions.  Will watch your condition.  Will get help right away if you are not doing well or get worse. Document Released: 04/28/2004 Document Revised: 01/09/2013 Document Reviewed: 11/01/2012 [facility] Patient Information 2015 [place], [place]. This information is not intended to replace advice given to you by your health care provider. Make sure you discuss any questions you have with your health care provider.

## 2017-04-18 ENCOUNTER — Other Ambulatory Visit (HOSPITAL_COMMUNITY): Payer: Self-pay | Admitting: Family Medicine

## 2017-04-18 ENCOUNTER — Ambulatory Visit (HOSPITAL_COMMUNITY)
Admission: RE | Admit: 2017-04-18 | Discharge: 2017-04-18 | Disposition: A | Discharge status: Home / Self Care | Payer: Medicaid Other | Source: Ambulatory Visit | Attending: Family Medicine | Admitting: Family Medicine

## 2017-04-18 DIAGNOSIS — M1711 Unilateral primary osteoarthritis, right knee: Secondary | ICD-10-CM | POA: Insufficient documentation

## 2017-04-18 DIAGNOSIS — M1712 Unilateral primary osteoarthritis, left knee: Secondary | ICD-10-CM

## 2017-05-08 ENCOUNTER — Ambulatory Visit: Payer: PRIVATE HEALTH INSURANCE | Admitting: Family Medicine

## 2017-05-12 ENCOUNTER — Ambulatory Visit (INDEPENDENT_AMBULATORY_CARE_PROVIDER_SITE_OTHER): Payer: Medicaid Other | Admitting: Family Medicine

## 2017-05-12 ENCOUNTER — Other Ambulatory Visit: Payer: Self-pay

## 2017-05-12 ENCOUNTER — Encounter: Payer: Self-pay | Admitting: Family Medicine

## 2017-05-12 VITALS — BP 150/80 | HR 95 | Temp 98.4°F | Resp 17 | Ht 66.0 in | Wt 275.2 lb

## 2017-05-12 DIAGNOSIS — R739 Hyperglycemia, unspecified: Secondary | ICD-10-CM

## 2017-05-12 DIAGNOSIS — G8194 Hemiplegia, unspecified affecting left nondominant side: Secondary | ICD-10-CM

## 2017-05-12 DIAGNOSIS — E039 Hypothyroidism, unspecified: Secondary | ICD-10-CM | POA: Diagnosis not present

## 2017-05-12 DIAGNOSIS — I10 Essential (primary) hypertension: Secondary | ICD-10-CM | POA: Diagnosis not present

## 2017-05-12 DIAGNOSIS — I639 Cerebral infarction, unspecified: Secondary | ICD-10-CM | POA: Diagnosis not present

## 2017-05-12 DIAGNOSIS — E785 Hyperlipidemia, unspecified: Secondary | ICD-10-CM | POA: Diagnosis not present

## 2017-05-12 MED ORDER — LISINOPRIL-HYDROCHLOROTHIAZIDE 20-25 MG PO TABS
1.0000 | ORAL_TABLET | Freq: Every day | ORAL | 3 refills | Status: DC
Start: 2017-05-12 — End: 2017-05-26

## 2017-05-12 NOTE — Progress Notes (Signed)
Patient ID: [name], female    DOB: 1965-06-20, 52 y.o.   MRN: [id number]  Chief Complaint  Patient presents with  . Cyst    right knee,     Allergies Patient has no known allergies.  Subjective:   [name] is a 52 y.o. female who presents to [facility] today.  HPI [name] presents today with her daughter for new patient visit to establish care.  They have previously been followed by Dr. Cecelia Byars in [place], [place].  She does not bring in any of her medications to the office visit today nor are they able to tell me what medication she is on.  They do report that she has been on a blood pressure medication but they are not sure of the name of the medication.  We were able to call over to her previous primary care physician's office and get a list of her medications.  The list of medications is hand written and very difficult to read.  It looks as if she is on aspirin 325 mg p.o. daily, Synthroid 50 mcg p.o. daily, Pravachol 40 mg p.o. daily, and vitamin D3 5000 units daily.  They adamantly report that she is previously been on a blood pressure medicine although none is listed.  There are no other records which are available for review at this visit.  She does report that she had lab work done by Dr. Cecelia Byars several days ago and they do not want labs done today until we review those tests.  The patient reports that she is in a hurry today because she needs to pick up someone and take them to an appointment.  She reports that she is in a hurry but will come back for a longer visit when she can answer more of my questions.  She reports that she has had 2 strokes in the past which have subsequently left her with left sided upper hemiplegia and difficulty with walking.  She does smoke cigarettes.  She denies any current alcohol or drug use.  She reports that she mainly eats meat products and does not eat fruits or vegetables.  She does not do exercise.  She  did not complete therapy after having her stroke but is interested in that now.  Her daughter is present at the visit and reports that she is considering taking FMLA from her job so that she can get her mother to her doctor's appointments and assist her with her needs.  She reports that she believes her mother would benefit from therapy but her mother is unable to get there on her own.  Patient reports that she has never had a heart attack.  She denies any chest pain, shortness of breath.  She does report that she gets some swelling in the left arm which was affected by her stroke.  She reports she does get some left lower extremity heaviness and does have some residual weakness in her left leg.  Her daughter reports that she tends to walk and drag her leg.  She does walk with a cane.  She does not wish to have a pronged cane because she believes it is too heavy and cumbersome to deal with.  Patient reports that her mood is good.  Her energy level is normal.  She reports compliance with her cholesterol medications.  She reports she has never been checked for diabetes.  She reports that she drinks sodas on a daily basis.  She is not interested in quitting smoking at this time.  Patient does report that she has had a cyst on the right side of her knee for many years she reports it does not bother her or cause her pain.  She tells me she is not interested in getting it removed or having it seen and evaluated by an orthopedic.  The daughter reports that she would like to have it checked out.  The patient reports that she is not having it checked out.    Past Medical History:  Diagnosis Date  . High cholesterol   . Hypertension   . Stroke [facility])     Past Surgical History:  Procedure Laterality Date  . DILATION AND CURETTAGE OF UTERUS      Family History  Problem Relation Age of Onset  . Hypertension Mother   . Liver disease Mother   . Heart attack Father   . HIV Sister   . Arrhythmia Brother     . CAD Brother   . Arrhythmia Sister   . Mood Disorder Sister      Social History   Socioeconomic History  . Marital status: Single    Spouse name: None  . Number of children: None  . Years of education: None  . Highest education level: None  Social Needs  . Financial resource strain: None  . Food insecurity - worry: None  . Food insecurity - inability: None  . Transportation needs - medical: None  . Transportation needs - non-medical: None  Occupational History  . None  Tobacco Use  . Smoking status: Heavy Tobacco Smoker    Packs/day: 1.00    Years: 32.00    Pack years: 32.00    Types: Cigarettes  . Smokeless tobacco: Never Used  Substance and Sexual Activity  . Alcohol use: No    Frequency: Never  . Drug use: No  . Sexual activity: No  Other Topics Concern  . None  Social History Narrative   Grew up in [place], [place].   Engaged to be married.   5 children.    Eats mainly meat/junk food.   Current Outpatient Medications on File Prior to Visit  Medication Sig Dispense Refill  . pravastatin (PRAVACHOL) 40 MG tablet Take 1 tablet (40 mg total) by mouth daily. 30 tablet 2   No current facility-administered medications on file prior to visit.     Review of Systems  Constitutional: Negative for activity change, appetite change and fever.  HENT: Negative for trouble swallowing and voice change.   Eyes: Negative for visual disturbance.  Respiratory: Negative for cough, chest tightness and shortness of breath.   Cardiovascular: Negative for chest pain, palpitations and leg swelling.  Gastrointestinal: Negative for abdominal pain, nausea and vomiting.  Genitourinary: Negative for dysuria, frequency and urgency.  Musculoskeletal: Negative for arthralgias and myalgias.  Skin: Negative for rash.  Neurological: Positive for weakness. Negative for dizziness, syncope and light-headedness.  Hematological: Negative for adenopathy.  Psychiatric/Behavioral: Negative for  dysphoric mood and sleep disturbance. The patient is not nervous/anxious.      Objective:   BP (!) 150/80 (BP Location: Right Arm, Patient Position: Sitting, Cuff Size: Normal)   Pulse 95   Temp 98.4 F (36.9 C)   Resp 17   Ht 5\' 6"  (1.676 m)   Wt 275 lb 4 oz (124.9 kg)   LMP 09/11/2012   SpO2 95%   BMI 44.43 kg/m   Physical Exam  Constitutional: She is oriented to person,  place, and time. She appears well-developed and well-nourished. No distress.  Obese African-American female appearance older than her stated age.  Sitting in wheelchair in the exam room.  Holds her cane.  HENT:  Head: Normocephalic and atraumatic.  Eyes: Pupils are equal, round, and reactive to light.  Neck: Normal range of motion. Neck supple. No thyromegaly present.  Cardiovascular: Normal rate, regular rhythm and normal heart sounds.  Pulmonary/Chest: Effort normal and breath sounds normal. No respiratory distress.  Musculoskeletal:       Legs: Right knee, lateral upper knee/quadriceps area with mobile, nontender, approximate 1-2 cm cystic region.  No associated erythema or edema present.   Neurological: She is alert and oriented to person, place, and time. No cranial nerve deficit. Gait abnormal.  Left upper extremity with minimal resistance against gravity.  Right-sided upper and lower extremities 5 out of 5 strength.  Decreased sensation in left upper extremity.  Skin: Skin is warm and dry. Capillary refill takes less than 2 seconds.  Psychiatric: Thought content normal. Her affect is labile. She expresses impulsivity.  Pleasant female.  Mood not exactly normal or with normal affect.  Patient decided during exam that she needed to leave and was ready to go.  Very matter-of-fact and blunt in her conversational style.  Nursing note and vitals reviewed.    Assessment and Plan  1. Essential hypertension - lisinopril-hydrochlorothiazide (PRINZIDE,ZESTORETIC) 20-25 MG tablet; Take 1 tablet by mouth daily.   Dispense: 90 tablet; Refill: 3 -Patient will have labs sent to our office today, per her daughter. I told them that I would review labs and then order the needed tests, this plan would avoid duplication.  2. 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  Continue walking with assistive devices.  Did discuss with daughter today that I could do referral for home health for evaluation for assistive devices and physical therapy/gait stability strengthening exercises.  She reports that she will let me know and we will discuss with her mother.  3. Cerebral infarction, unspecified mechanism (HCC) We will request records at this time.  I did asked patient why she is on aspirin 325 mg a day.  She reports she has never been on Plavix.  Will request records from neurology consult.  Patient's daughter will have them sent to our office.  4. Hypothyroidism, unspecified type Continue Synthroid at this time.  We will plan on checking TSH if it is not been done.  Did discuss with daughter that patient would need thyroid, cholesterol, basic metabolic panel, liver function studies, hemoglobin A1c.  She reports that several days ago she that her mother had echo study done at Dr. Osborne Casco office, daughter reports she will have that sent to [place].  Visit was cut short today because patient had to leave to get her fianc to an appointment.  Patient and her daughter report that they will follow-up. -Cyst versus lipoma versus fatty area of right upper knee discussed with patient.  She defers evaluation by orthopedics.  She does not want any evaluation of this area at this time.  She will call or let me know if she changes her mind. By end of the day before closing out chart, I am not received records from Dr. Osborne Casco office.  Therefore I ordered labs.  I have sent message to nursing asking them to please call patient and daughter and advised that she needs to come and get blood work if she is not going to have the labs sent to  our  office for review within the next several days. Return in about 2 weeks (around 05/26/2017) for BP. Aliene Beams, MD 05/12/2017

## 2017-05-12 NOTE — Patient Instructions (Signed)
Take the thyroid medication/levothyroxine, take the cholesterol medication/pravastatin, take the aspirin, take the new BP medication

## 2017-05-18 ENCOUNTER — Telehealth: Payer: Self-pay

## 2017-05-18 ENCOUNTER — Telehealth: Payer: Self-pay | Admitting: Family Medicine

## 2017-05-18 NOTE — Telephone Encounter (Signed)
Patients daughter called in stating that her prescription lisinopril is making her feel down, sad, and nauseous.  Cb#: [phone]  I asked the daughter if she researched the symptoms to make sure that this was a side affect or reaction to the medication. She told me that she worked for [employer] she does what I do and asked me why I would ask her that. I asked her that so I could type a message to the doctor and nurse as detailed as possible.  The daughter asked if we have received medical records for patient from Dr.Diego's office because she had just completed labs and wanted to know if [facility] doctor had received the results,  but this person is not on the hip[employer] form, she said she works for [employer] and she will fax one to [place]. It is to my understanding that this form is to be filled out and signed in our office. She stated her mother is disabled and that our staff would have to go down stairs to help her back upstairs to sign this form because she said that no one asked her mother to sign this when she had her first appt .  The daughter then told me not to interrupt her while she was talking and was being rude, I told her she should speak with an Production designer, theatre/television/film and she told me that was my best bet.

## 2017-05-18 NOTE — Telephone Encounter (Signed)
Also, please sign HH order and I will take care of everything after that.

## 2017-05-18 NOTE — Telephone Encounter (Signed)
Called patient regarding message below. No answer, unable to leave message.  

## 2017-05-18 NOTE — Telephone Encounter (Signed)
Please advise on lisinopril

## 2017-05-18 NOTE — Telephone Encounter (Signed)
I talked with [name] after Dusty at our front desk talked with her on the phone.  I let her know we did not have a DPR signed and we should have done this  when she came it.  I told her I would be happy to mail her Mom the DPR or she could come by and sign it.  I talked with Bethann Berkshire and Tamela Oddi regarding the importance of the DPR being signed and on file for all new patients.  I also checked with Kaleen Odea, Dr Madelaine Etienne nurse and she is following up with [facility] to see if we can get her initial evaluation set up asap.  A note was already sent to Dr Tracie Harrier regarding the question about her medication.  Kaleen Odea will be following up on both of these request.  [name] said she will bring her Mom by to sign the DPR so we can talk with [name] about this patient.

## 2017-05-18 NOTE — Telephone Encounter (Signed)
Please call and advise patient should stop the lisinopril.  I have not received records from her previous primary care physician.  Nor have I received any labs.  I was told that they were going to have the sent to our office.  It is her responsibility to have these records sent to our office.  We probably could have completed more thing on the day of the visit but patient rushed out of the office visit to get to another commitment.  We do have an elevator in our office building so that our disabled patients are able to move up and down between the office floors as needed.  Please ask patient to make sure that she has an appointment scheduled for follow-up.  I need him to come in and discuss blood pressure medications at their earliest convenience.Please have them schedule a follow up.

## 2017-05-24 ENCOUNTER — Telehealth: Payer: Self-pay

## 2017-05-24 NOTE — Telephone Encounter (Signed)
Please call patient and if she is able to come in sooner for a BP check that would be great. Advise her that we have still not received records from Dr. Delbert Harness. Need copy of labs that were done the few days before her visit.

## 2017-05-24 NOTE — Telephone Encounter (Signed)
Pt daughter called today regarding the BP med problem has not been taken care of.  Please call the patient she is waiting for your call.  Please call ASAP. I told the patient we would call here within the hour.  Daughter is very upset it does not appear this is taken care of.  Daughter is not on DPR so could not discuss messages in the file. She conference called her mother and I told the mother the Nurse would call her back. I ask her to please pick up the phone when the Nurse calls her back. I called [name] to discuss this phone call.

## 2017-05-24 NOTE — Telephone Encounter (Signed)
Spoke to patient, advised she stop the lisinopril. She states she has stopped it and is no longer have the sadness or nausea. She is scheduled to come back March 7. She is not having any cardiac symptoms. She is not taking blood pressure at home, but states she is not worried about it because she has not been stressed. Should she come in sooner? Also, she is wanting some equipment in her bathroom, grab bars and such. Should we place referral now or wait until visit?  Please advise.

## 2017-05-25 ENCOUNTER — Telehealth: Payer: Self-pay

## 2017-05-25 NOTE — Telephone Encounter (Signed)
I called pt again since to make her appt to come in today or tomorrow regarding her BP.  We were concerned we were not able to get her earlier and had LMOM.  She answered and she has appt tomorrow at 10am.  I reminded her that we need to sign a DPR is she wants [place] to discuss her care and medical record with [name] her daughter.  She said she would think about it but she is not sure she wants [name] to have access to her Medical Record. I have noted to file to ask her when she come in if she wants to sign the DPR or not.

## 2017-05-25 NOTE — Telephone Encounter (Signed)
Called pt and LMOM that she need to come in tomorrow at 10 am for BP check and to talk with Dr Tracie Harrier regarding her BP med.

## 2017-05-25 NOTE — Telephone Encounter (Signed)
Shirlee Limerick has left message for patient to schedule. Request for records faxed.

## 2017-05-26 ENCOUNTER — Encounter: Payer: Self-pay | Admitting: Family Medicine

## 2017-05-26 ENCOUNTER — Ambulatory Visit (INDEPENDENT_AMBULATORY_CARE_PROVIDER_SITE_OTHER): Payer: Medicaid Other | Admitting: Family Medicine

## 2017-05-26 VITALS — BP 150/90 | HR 88 | Temp 98.1°F | Resp 16 | Ht 66.0 in | Wt 280.0 lb

## 2017-05-26 DIAGNOSIS — G8929 Other chronic pain: Secondary | ICD-10-CM

## 2017-05-26 DIAGNOSIS — I10 Essential (primary) hypertension: Secondary | ICD-10-CM

## 2017-05-26 DIAGNOSIS — Z1211 Encounter for screening for malignant neoplasm of colon: Secondary | ICD-10-CM

## 2017-05-26 DIAGNOSIS — M25572 Pain in left ankle and joints of left foot: Secondary | ICD-10-CM

## 2017-05-26 DIAGNOSIS — Z1239 Encounter for other screening for malignant neoplasm of breast: Secondary | ICD-10-CM

## 2017-05-26 DIAGNOSIS — Z1231 Encounter for screening mammogram for malignant neoplasm of breast: Secondary | ICD-10-CM

## 2017-05-26 DIAGNOSIS — Z8673 Personal history of transient ischemic attack (TIA), and cerebral infarction without residual deficits: Secondary | ICD-10-CM

## 2017-05-26 MED ORDER — HYDROCHLOROTHIAZIDE 25 MG PO TABS: 25.0000 mg | ORAL_TABLET | Freq: Every day | ORAL | 3 refills | Status: DC

## 2017-05-26 MED ORDER — LISINOPRIL 40 MG PO TABS: 40.0000 mg | ORAL_TABLET | Freq: Every day | ORAL | 3 refills | Status: DC

## 2017-05-26 NOTE — Telephone Encounter (Signed)
Followed up pt ov 05/26/17

## 2017-05-26 NOTE — Progress Notes (Signed)
Patient ID: [name], female    DOB: 25-Jul-1965, 52 y.o.   MRN: [id number]  Chief Complaint  Patient presents with  . Hypertension    Allergies Patient has no known allergies.  Subjective:   [name] is a 53 y.o. female who presents to [facility] today.  HPI [name] presents today for follow-up of her blood pressure.  She was seen for initial visit at our office several weeks ago and started on amlodipine.  She called several days after starting the medication and reports that she was unable to take it because it made her feel nauseated and made her feel sad.  She reports she subsequently stopped the medication and is felt fine.  She comes in today for evaluation of the blood pressure.  She reports she is still taking one medication for blood pressure that she has been on for quite some time.  She is unsure of the name of it but reports she has never had any side effects with it.  She denies any chest pain, shortness of breath, or palpitations.  She denies any swelling in her feet.  She reports that she would like to get a brace for her ankle.  She reports that after her stroke she fell when she was outside on her balcony and hurt her foot crack.  She reports that this was in 2013 and since that time she has had chronic pain in her ankle and her ankle joint does not feel stable.  She reports that with the pain in her ankle and the instability in her joint make it difficult for her to get in and out of the shower and tub.  She reports that she did wear an ankle boot for quite some time after the initial injury in 2013 but has never sought evaluation for her ankle.  She reports that she would like to have a grab bar and some assistive devices at her house to help her with showering and bathing.  She has chronic weakness on the left side.  She reports that  it is hard for her to get in and out of the tub due to her left-sided weakness from her stroke.  She has  not had any falls at this time. She reports she would also like to have a 4 pronged cane.  She is taking her cholesterol medication each night.  She denies any myalgias or side effects with it.  She is not sure what her last cholesterol values were.  She did not bring in her list of all her medications today.  She does not want to get any blood work done because she reports that a few days prior to her initial visit with [place] she had a full panel of blood work done at Dr. Osborne Casco office.  She would like [place] to request these labs.  In addition, she wants to let me know that she did sign the paperwork with whom we can share her medical information with.  She reports that she listed her boyfriend on this form.  She reports that she does not want daughter, [name], to receive any information about her or her care. She reports that she does not trust her and believes that she just wants to put her in a nursing home.       Past Medical History:  Diagnosis Date  . High cholesterol   . Hypertension   . Stroke [facility])     Past Surgical  History:  Procedure Laterality Date  . DILATION AND CURETTAGE OF UTERUS      Family History  Problem Relation Age of Onset  . Hypertension Mother   . Liver disease Mother   . Heart attack Father   . HIV Sister   . Arrhythmia Brother   . CAD Brother   . Arrhythmia Sister   . Mood Disorder Sister      Social History   Socioeconomic History  . Marital status: Single    Spouse name: Not on file  . Number of children: Not on file  . Years of education: Not on file  . Highest education level: Not on file  Social Needs  . Financial resource strain: Not on file  . Food insecurity - worry: Not on file  . Food insecurity - inability: Not on file  . Transportation needs - medical: Not on file  . Transportation needs - non-medical: Not on file  Occupational History  . Not on file  Tobacco Use  . Smoking status: Heavy Tobacco Smoker    Packs/day: 1.00     Years: 32.00    Pack years: 32.00    Types: Cigarettes  . Smokeless tobacco: Never Used  Substance and Sexual Activity  . Alcohol use: No    Frequency: Never  . Drug use: No  . Sexual activity: No  Other Topics Concern  . Not on file  Social History Narrative   Grew up in [place], [place].   Engaged to be married.   5 children.    Eats mainly meat/junk food.   Current Outpatient Medications on File Prior to Visit  Medication Sig Dispense Refill  . pravastatin (PRAVACHOL) 40 MG tablet Take 1 tablet (40 mg total) by mouth daily. 30 tablet 2   No current facility-administered medications on file prior to visit.     Review of Systems  Constitutional: Negative for activity change, appetite change, fever and unexpected weight change.  Eyes: Negative for visual disturbance.  Respiratory: Negative for cough, chest tightness and shortness of breath.   Cardiovascular: Negative for chest pain, palpitations and leg swelling.  Gastrointestinal: Negative for abdominal pain, nausea and vomiting.  Genitourinary: Negative for dysuria, frequency and urgency.  Musculoskeletal: Positive for arthralgias.       Pain in left ankle.  Skin: Negative for rash.  Neurological: Negative for dizziness, tremors, syncope and light-headedness.  Hematological: Negative for adenopathy.     Objective:   BP (!) 150/90 (BP Location: Right Arm, Patient Position: Sitting, Cuff Size: Normal)   Pulse (!) 117   Temp 98.1 F (36.7 C) (Temporal)   Resp 16   Ht 5\' 6"  (1.676 m)   Wt 280 lb (127 kg)   LMP 09/11/2012   SpO2 97%   BMI 45.19 kg/m   Physical Exam  Constitutional: She is oriented to person, place, and time. She appears well-developed and well-nourished.  HENT:  Head: Normocephalic and atraumatic.  Neck: Normal range of motion. Neck supple.  Cardiovascular: Normal rate, regular rhythm and normal heart sounds.  Pulmonary/Chest: Effort normal and breath sounds normal.  Musculoskeletal:       Left  ankle: She exhibits decreased range of motion. She exhibits no swelling, no ecchymosis and no laceration. Tenderness.  Neurological: She is alert and oriented to person, place, and time.  Skin: Skin is warm and dry.  Psychiatric: She has a normal mood and affect. Her behavior is normal. Judgment and thought content normal.  Vitals reviewed.  Assessment and Plan  1. Hypertension, unspecified type I called [facility] pharmacy today because patient was unsure of her medication that she was currently taking for blood pressure.  I did confirm with pharmacy that patient was taking lisinopril/HCTZ 20/25 mg, 1 p.o. daily.  I discussed with patient that at this time due to the fact that her blood pressure is uncontrolled we will increase her lisinopril to 40 mg p.o. daily.  We will start HCTZ 25 mg p.o. daily.  I explained to patient that this will now be in 2 separate medications.  She is to stop the pill that she is currently taking at home but the lisinopril/HCTZ combination pill.  She voiced understanding. - lisinopril (PRINIVIL,ZESTRIL) 40 MG tablet; Take 1 tablet (40 mg total) by mouth daily.  Dispense: 90 tablet; Refill: 3 - hydrochlorothiazide (HYDRODIURIL) 25 MG tablet; Take 1 tablet (25 mg total) by mouth daily.  Dispense: 90 tablet; Refill: 3  We did also discuss trying to work on her diet and decreasing her salt intake.  I did tell [name] that we would try to get her records from her previous PCP including the last lab work.  I did try to get this lab work from the lab next door where she said it was drawn, however they were not allowed to release it to [place].  I explained to her that if we have not received these lab test by the time she comes back in the next month that I will need to repeat some blood work in order to take care of her.  She voiced understanding. Needs BMP at follow-up. 2. Screening for breast cancer Screening mammography ordered - MM Digital Screening; Future  3. Screen  for colon cancer Patient is 52 years old and due for colon cancer screening.  She has no family history of colon cancer and no personal history of inflammatory bowel disease or colon problems.  Will order cologuard testing at this time. - Cologuard  4. Chronic pain of left ankle Refer to orthopedics for evaluation. - Ambulatory referral to Orthopedic Surgery  5. History of CVA (cerebrovascular accident) Discussed with patient will place referral with home health for home evaluation for assistive devices and home safety evaluation which would help decrease her fall risk.  [facility]  I did discuss with patient that they will do an evaluation at her house and help her with getting equipment that will help her with ambulation and with showering.  Patient would also like to have a pronged cane.  She was told this could be picked up by [facility] or she can wait and discuss this with [facility].  Paperwork was signed today to request labs and medical records from Dr. Janna Arch.  In addition, forms are completed where patient signed who we were allowed to share her health information with.  She told me specifically that she does not want her daughter, [name], on this list.  She specifically tells me she does not want any of her healthcare information shared with her daughter at any point.  Return in about 3 weeks (around 06/16/2017). Aliene Beams, MD 05/26/2017

## 2017-05-26 NOTE — Patient Instructions (Signed)
STOP the Lisinipril/HCTZ combo  START the Lisinopril 40 mg a day and the HCTZ 25 mg a day Take both in the morning.

## 2017-05-26 NOTE — Telephone Encounter (Signed)
Pt ov 05/26/17

## 2017-06-01 ENCOUNTER — Other Ambulatory Visit: Payer: Self-pay

## 2017-06-01 ENCOUNTER — Ambulatory Visit (INDEPENDENT_AMBULATORY_CARE_PROVIDER_SITE_OTHER): Payer: Medicaid Other | Admitting: Family Medicine

## 2017-06-01 ENCOUNTER — Encounter: Payer: Self-pay | Admitting: Family Medicine

## 2017-06-01 VITALS — BP 135/82 | HR 98 | Temp 98.0°F | Resp 16 | Ht 66.0 in | Wt 281.0 lb

## 2017-06-01 DIAGNOSIS — F17200 Nicotine dependence, unspecified, uncomplicated: Secondary | ICD-10-CM | POA: Insufficient documentation

## 2017-06-01 DIAGNOSIS — I69359 Hemiplegia and hemiparesis following cerebral infarction affecting unspecified side: Secondary | ICD-10-CM

## 2017-06-01 DIAGNOSIS — Z720 Tobacco use: Secondary | ICD-10-CM | POA: Diagnosis not present

## 2017-06-01 DIAGNOSIS — E039 Hypothyroidism, unspecified: Secondary | ICD-10-CM

## 2017-06-01 DIAGNOSIS — R252 Cramp and spasm: Secondary | ICD-10-CM | POA: Diagnosis not present

## 2017-06-01 DIAGNOSIS — E785 Hyperlipidemia, unspecified: Secondary | ICD-10-CM | POA: Diagnosis not present

## 2017-06-01 DIAGNOSIS — I10 Essential (primary) hypertension: Secondary | ICD-10-CM

## 2017-06-01 MED ORDER — LEVOTHYROXINE SODIUM 50 MCG PO TABS: 50.0000 ug | ORAL_TABLET | Freq: Every day | ORAL | 0 refills | Status: DC

## 2017-06-01 NOTE — Progress Notes (Signed)
Patient ID: [name], female    DOB: 1965/09/24, 52 y.o.   MRN: [id number]  Chief Complaint  Patient presents with  . Hypertension    Allergies Patient has no known allergies.  Subjective:   [name] is a 52 y.o. female who presents to [facility] today.  HPI Here for a BP follow up. Reports that she gets cramps in legs and that they hurt from time to time. Worse in the mornings and with turning or stretching. Has been going on for months.  Not getting worse.  Bother her intermittently.  Cramps last for a few seconds.  Better with standing straight. No weakness. No associated numbness and tingling.   Reports that today is her birthday and celebrated a bit last night by drinking vodka and smoking cigarettes.   Patient reports that she needs a refill on her thyroid medication.  She reports that she was told by her previous PCP that she would need this medication for the rest of her life.  She is unsure of the dose.  Does not have a medical bottle with her.  We have still not received records from previous PCP.  She reports that she did not get a call from home health regarding fall risk evaluation.  She has had several falls over the past year.  She reports that she needs assistive devices in her home secondary to her hemiparesis from her stroke.  She reports it is very hard for her to get up and down off the toilet because her toilet is so low.  She has fallen once over the past year because she does not have a grab bar for stabilization in her shower.  She has not had any falls in the past couple months.  She reports that she never completed full physical therapy program after her stroke.  She does have a cane that was her mother's that she is using.  She reports she feels she would be more stable with a 4 pronged cane.  She reports her heart is beating okay.  She reports she is breathing well.  She reports she is getting over having a cold last week.  She  denies any shortness of breath or sputum production.  Appetite is good.  Denies any fevers, chills, nausea, vomiting, or diarrhea.  She reports she has received her Colo guard in the mail.  She reports she has been taking her blood pressure medications as directed.  She denies any side effects with the increased doses of lisinopril and HCTZ.  She reports that she does not want to quit smoking.  She reports that she believes that smoking is good for her because it helps calm her down.  She likes to have something in her hands per her report.  She reports she will let me know if she is interested in quitting in the future.    Past Medical History:  Diagnosis Date  . High cholesterol   . Hypertension   . Stroke [facility])     Past Surgical History:  Procedure Laterality Date  . DILATION AND CURETTAGE OF UTERUS      Family History  Problem Relation Age of Onset  . Hypertension Mother   . Liver disease Mother   . Heart attack Father   . HIV Sister   . Arrhythmia Brother   . CAD Brother   . Arrhythmia Sister   . Mood Disorder Sister      Social History  Socioeconomic History  . Marital status: Single    Spouse name: None  . Number of children: None  . Years of education: None  . Highest education level: None  Social Needs  . Financial resource strain: None  . Food insecurity - worry: None  . Food insecurity - inability: None  . Transportation needs - medical: None  . Transportation needs - non-medical: None  Occupational History  . None  Tobacco Use  . Smoking status: Heavy Tobacco Smoker    Packs/day: 1.00    Years: 32.00    Pack years: 32.00    Types: Cigarettes  . Smokeless tobacco: Never Used  Substance and Sexual Activity  . Alcohol use: No    Frequency: Never  . Drug use: No  . Sexual activity: No  Other Topics Concern  . None  Social History Narrative   Grew up in [place], [place].   Engaged to be married.   5 children.    Eats mainly meat/junk food.    Current Outpatient Medications on File Prior to Visit  Medication Sig Dispense Refill  . hydrochlorothiazide (HYDRODIURIL) 25 MG tablet Take 1 tablet (25 mg total) by mouth daily. 90 tablet 3  . lisinopril (PRINIVIL,ZESTRIL) 40 MG tablet Take 1 tablet (40 mg total) by mouth daily. 90 tablet 3  . pravastatin (PRAVACHOL) 40 MG tablet Take 1 tablet (40 mg total) by mouth daily. 30 tablet 2   No current facility-administered medications on file prior to visit.     Review of Systems  Constitutional: Negative for appetite change, fatigue and fever.  HENT: Negative for trouble swallowing and voice change.   Respiratory: Negative for chest tightness, shortness of breath and wheezing.   Cardiovascular: Negative for chest pain, palpitations and leg swelling.  Gastrointestinal: Negative for abdominal pain, constipation, diarrhea and vomiting.  Genitourinary: Negative for dysuria and urgency.  Musculoskeletal: Negative for arthralgias, myalgias and neck stiffness.  Skin: Negative for rash.  Neurological: Positive for weakness. Negative for dizziness, tremors, seizures, syncope, speech difficulty, light-headedness, numbness and headaches.  Psychiatric/Behavioral: Negative for dysphoric mood. The patient is not nervous/anxious.      Objective:   BP 135/82 (BP Location: Left Arm, Patient Position: Sitting, Cuff Size: Normal)   Pulse 98   Temp 98 F (36.7 C) (Temporal)   Resp 16   Ht 5\' 6"  (1.676 m)   Wt 281 lb (127.5 kg)   LMP 09/11/2012   SpO2 98%   BMI 45.35 kg/m   Physical Exam  Constitutional: She is oriented to person, place, and time. She appears well-developed and well-nourished. No distress.  HENT:  Head: Normocephalic and atraumatic.  Eyes: Conjunctivae are normal. Pupils are equal, round, and reactive to light. No scleral icterus.  Neck: Normal range of motion. Neck supple. No tracheal deviation present. No thyromegaly present.  Cardiovascular: Normal rate, regular rhythm  and normal heart sounds.  Pulmonary/Chest: Effort normal and breath sounds normal. No respiratory distress.  Musculoskeletal:  No pain with lower extremity muscle palpation bilaterally.  Muscles soft and supple nontender to palpation.  Negative Homens bilaterally.  Neurological: She is alert and oriented to person, place, and time. No cranial nerve deficit.  Skin: Skin is warm and dry.  Psychiatric: She has a normal mood and affect. Her behavior is normal.  Nursing note and vitals reviewed.    Assessment and Plan  1. 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  Patient would like to have  some physical therapy and assistive devices.  I believe that patient needs to have a safety evaluation of her home and get assistive devices to decrease her fall risk. - Ambulatory referral to Home Health  2. Hypothyroidism, unspecified type Check TSH today.  Refill medication. - levothyroxine (SYNTHROID, LEVOTHROID) 50 MCG tablet; Take 1 tablet (50 mcg total) by mouth daily.  Dispense: 90 tablet; Refill: 0  3. Essential hypertension Pressures controlled on increased dose of medications.  Continue lisinopril and HCTZ as directed.  Check BMP today.  Patient was asked to continue to monitor her salt intake.  In addition we did discuss that alcohol and tobacco use can elevate blood pressure and increased cardiovascular risk.  She voiced understanding.  4. Hyperlipidemia LDL goal <70 Continue pravastatin.  Check FLP today.  Check LFTs.  5.  Leg cramps intermittent Possibly secondary to potassium/electrolyte disturbance.  Check today.  Patient was counseled concerning worrisome signs and symptoms and if develop return to clinic.  Place referral for mammogram today. We will plan on completing Pap, pelvic and breast exam and complete physical examination. Called [facility] today to confirm dosing of her thyroid medication before refill.   Patient was counseled concerning administration of thyroid medication in regards to food, vitamins, and supplements, and other medications.  6.  Tobacco abuse The 5 A's Model for treating Tobacco Use and Dependence was used today. I have identified and documented tobacco use status for this patient. I have urged the patient to quit tobacco use. At this time, the patient is unwilling and not ready to attempt to quit. I have provided patient with information regarding risks, cessation techniques, and interventions that might increase future attempts to quit smoking. I will plan on again addressing tobacco dependence at the next visit.   Return in about 3 months (around 08/29/2017) for BP. Aliene Beams, MD 06/01/2017

## 2017-06-01 NOTE — Patient Instructions (Signed)
Schedule mammogram at check-out 

## 2017-06-08 ENCOUNTER — Ambulatory Visit: Payer: Medicaid Other | Admitting: Family Medicine

## 2017-06-09 ENCOUNTER — Telehealth: Payer: Self-pay | Admitting: Family Medicine

## 2017-06-09 ENCOUNTER — Encounter (HOSPITAL_COMMUNITY): Payer: Self-pay

## 2017-06-09 ENCOUNTER — Ambulatory Visit (HOSPITAL_COMMUNITY)
Admission: RE | Admit: 2017-06-09 | Discharge: 2017-06-09 | Disposition: A | Discharge status: Home / Self Care | Payer: Medicaid Other | Source: Ambulatory Visit | Attending: Family Medicine | Admitting: Family Medicine

## 2017-06-09 DIAGNOSIS — Z1231 Encounter for screening mammogram for malignant neoplasm of breast: Secondary | ICD-10-CM | POA: Diagnosis not present

## 2017-06-09 DIAGNOSIS — Z1239 Encounter for other screening for malignant neoplasm of breast: Secondary | ICD-10-CM

## 2017-06-09 NOTE — Telephone Encounter (Signed)
Called patient per Dr. Tracie Harrier. I received paperwork on the fax, FMLA forms for her daughter to take off work to care for patient. I called patient to make an appointment for paperwork because everything the patient has stated so far contradicted the need/desire to have her daughter involved in her healthcare. Patient does want the forms filled out, she does want her daughter to care for her, appointment scheduled.

## 2017-06-16 ENCOUNTER — Ambulatory Visit: Payer: Medicaid Other | Admitting: Family Medicine

## 2017-06-29 ENCOUNTER — Ambulatory Visit (INDEPENDENT_AMBULATORY_CARE_PROVIDER_SITE_OTHER): Payer: Medicaid Other | Admitting: Family Medicine

## 2017-06-29 ENCOUNTER — Other Ambulatory Visit: Payer: Self-pay

## 2017-06-29 ENCOUNTER — Telehealth: Payer: Self-pay | Admitting: Family Medicine

## 2017-06-29 ENCOUNTER — Encounter: Payer: Self-pay | Admitting: Family Medicine

## 2017-06-29 VITALS — BP 160/92 | HR 84 | Temp 98.2°F | Resp 16 | Ht 66.0 in | Wt 279.5 lb

## 2017-06-29 DIAGNOSIS — E785 Hyperlipidemia, unspecified: Secondary | ICD-10-CM

## 2017-06-29 DIAGNOSIS — Z01419 Encounter for gynecological examination (general) (routine) without abnormal findings: Secondary | ICD-10-CM | POA: Diagnosis not present

## 2017-06-29 DIAGNOSIS — Z720 Tobacco use: Secondary | ICD-10-CM

## 2017-06-29 DIAGNOSIS — I10 Essential (primary) hypertension: Secondary | ICD-10-CM | POA: Diagnosis not present

## 2017-06-29 DIAGNOSIS — R5383 Other fatigue: Secondary | ICD-10-CM

## 2017-06-29 DIAGNOSIS — R2681 Unsteadiness on feet: Secondary | ICD-10-CM | POA: Diagnosis not present

## 2017-06-29 LAB — CBC WITH DIFFERENTIAL/PLATELET
BASOS PCT: 0.6 %
Basophils Absolute: 42 cells/uL (ref 0–200)
Eosinophils Absolute: 63 cells/uL (ref 15–500)
Eosinophils Relative: 0.9 %
HEMATOCRIT: 35.3 % (ref 35.0–45.0)
Hemoglobin: 11.9 g/dL (ref 11.7–15.5)
LYMPHS ABS: 2429 {cells}/uL (ref 850–3900)
MCH: 28.1 pg (ref 27.0–33.0)
MCHC: 33.7 g/dL (ref 32.0–36.0)
MCV: 83.5 fL (ref 80.0–100.0)
MPV: 9.5 fL (ref 7.5–12.5)
Monocytes Relative: 6.5 %
NEUTROS PCT: 57.3 %
Neutro Abs: 4011 cells/uL (ref 1500–7800)
Platelets: 408 10*3/uL — ABNORMAL HIGH (ref 140–400)
RBC: 4.23 10*6/uL (ref 3.80–5.10)
RDW: 14.7 % (ref 11.0–15.0)
Total Lymphocyte: 34.7 %
WBC: 7 10*3/uL (ref 3.8–10.8)
WBCMIX: 455 {cells}/uL (ref 200–950)

## 2017-06-29 NOTE — Telephone Encounter (Signed)
I have talked with Lorin Picket about this today.  When the patient checked in [facility] May checked her in.  Tamela Oddi was very professional and asked Ms Donzetta Matters about signing a new DPR since [name] was with her and wanted to be involved with her mothers care.  The mother had asked prior for [place] not to release any information to [place].  On many occasions [name] has called the office mad that we could not release information to her and she has said many times she was going to report [place].  Betsy had the [facility] signed and documented it in the chart.  The were in the waiting room a little while and [name] came up to Dusty's check in window and wanted to know how long would "Fleet Contras" be prior to seeing her mother.  Dusty said let me go and check on how Dr Tracie Harrier is running.  She came back and told Jessica Dr Malena Peer nurse will be calling them back next.  At check out [name] checked out with me.  I provided the paperwork [name] asked for and even directed her down the hall via the back door to make it closer to the lab for her mother.  I had to wait for Dr Tracie Harrier to sign a handicap form for her so I waited for Dr Tracie Harrier and let the patient and [name] go ahead to the lab.  I  personally walked the form to the lab where they were waiting for the blood draw.  [name] thanked me.  Lorin Picket is aware of the threating calls we have experienced from [place] that she is reporting [place].  We were not allowed until today to discuss her mothers care with her.

## 2017-06-29 NOTE — Progress Notes (Signed)
Patient ID: [name], female    DOB: [birth year], 52 y.o.   MRN: [id number]  Chief Complaint  Patient presents with  . Form Completion  . Medication Management    medication makes her feel bad  . Cramp    Allergies Patient has no known allergies.  Subjective:   [name] is a 52 y.o. female who presents to [facility] today.  HPI [name] presents for an office visit today.  She is accompanied by her daughter, [name].  Upon entering the room, patient's daughter reports that they are here for several reasons.  She reports that she needs to have FMLA papers completed so that she can bring her mother to her office visits.  I did asked [name] if this is what she wanted.  She reports that she does want her daughter to bring her to the office visits.  I clarify this information with her, because on previous visit she has told me that she did not want her daughter to even receive any information regarding her health status and she has not previously listed her on the form to be able to share medical information with her daughter.  She had told me on previous visit that all her daughter wanted to do was have her placed in a nursing facility.  [name] does tell me in the office today that she would like her daughter to be listed on her health information sheet and she would like her daughter to bring her to the office visits.  Patient's daughter proceeds to tell me that she is very disappointed with the customer service that she has received in our office.  She reports that she would like for her mother to switch doctors and offices but her mother does not want to switch primary care physicians.  She tells me that she was told by 1 of the "higher ups" at [facility] that she should switch from our office because our office had issues.  She proceeds to tell me that she has the names of our office manager in front desk staff on a sheet of paper and shows it to me  stating that she is going to call and report our office for their behavior.  She also tells me that while we are talking she is going to try to speak to someone in the patient relations department. I explained to patient and her daughter that I do not know what has gone on with the front desk but my chief goal is to take care of her mother.  [name] tells me that several weeks ago or maybe a month ago she did not feel good one day so she decided to stop all of her medication.  She quit her lisinopril, HCTZ, levothyroxine, and pravastatin.  She reports that she has been feeling great since she stopped all of her medications.  Her daughter wants to know why she is not received any information regarding her referral to orthopedics for her ankle and hand pain.  Her daughter would also like to know why she has not heard back regarding her home health services.  She requests a prescription for a pronged cane.  Patient reports that she has been feeling tired since stopping her medications.  She denies any chest pain, shortness of breath or numbness and tingling in her extremities.  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  She denies any falls.  Quit all  medication one month ago and reports that she feels fine.  She does report that over the past several weeks that she has been having cramps all over body, in back, legs, arms.  Denies pain but just some occasional cramping sensations.  She did not have the still she stopped all of her medications.  She reports that her energy level can be low at times.  At the last visit she had told me that she was not interested in getting a change in her pronged cane.  She did not want to get the sleeve for her left arm that was affected by the stroke.  She had not wanted to get the sleeve or an assistive device for her hand because she was not sure if she would use it.  Today when she is accompanied by her daughter she reports that she will  use it.  She does tell me that she knows she needs to quit smoking but she is not ready to quit.  She is still smoking cigarettes.  She also reports that she still does drink alcohol.  While we are sitting in the office she pulls out to airplane size bottles of liquor to show me.  She does report that she has not been drinking today.  She reports that she is willing to get her labs done.  She did not go and get her labs done last time because she needed to get home per her report.  During the middle of the conversation she reports that she is ready to go and get her labs done and she is ready to leave.  She says she needs to leave so she can go home because someone is supposed to bring her some money to her house.  She concludes the visit.   Past Medical History:  Diagnosis Date  . High cholesterol   . Hypertension   . Stroke [facility])     Past Surgical History:  Procedure Laterality Date  . DILATION AND CURETTAGE OF UTERUS      Family History  Problem Relation Age of Onset  . Hypertension Mother   . Liver disease Mother   . Heart attack Father   . HIV Sister   . Arrhythmia Brother   . CAD Brother   . Arrhythmia Sister   . Mood Disorder Sister      Social History   Socioeconomic History  . Marital status: Single    Spouse name: Not on file  . Number of children: Not on file  . Years of education: Not on file  . Highest education level: Not on file  Occupational History  . Not on file  Social Needs  . Financial resource strain: Not on file  . Food insecurity:    Worry: Not on file    Inability: Not on file  . Transportation needs:    Medical: Not on file    Non-medical: Not on file  Tobacco Use  . Smoking status: Heavy Tobacco Smoker    Packs/day: 1.00    Years: 32.00    Pack years: 32.00    Types: Cigarettes  . Smokeless tobacco: Never Used  Substance and Sexual Activity  . Alcohol use: No    Frequency: Never  . Drug use: No  . Sexual activity: Never  Lifestyle    . Physical activity:    Days per week: Not on file    Minutes per session: Not on file  . Stress: Not on file  Relationships  .  Social connections:    Talks on phone: Not on file    Gets together: Not on file    Attends religious service: Not on file    Active member of club or organization: Not on file    Attends meetings of clubs or organizations: Not on file    Relationship status: Not on file  Other Topics Concern  . Not on file  Social History Narrative   Grew up in [place], [place].   Engaged to be married.   5 children.    Eats mainly meat/junk food.   Current Outpatient Medications on File Prior to Visit  Medication Sig Dispense Refill  . hydrochlorothiazide (HYDRODIURIL) 25 MG tablet Take 1 tablet (25 mg total) by mouth daily. (Patient not taking: Reported on 06/29/2017) 90 tablet 3  . levothyroxine (SYNTHROID, LEVOTHROID) 50 MCG tablet Take 1 tablet (50 mcg total) by mouth daily. (Patient not taking: Reported on 06/29/2017) 90 tablet 0  . lisinopril (PRINIVIL,ZESTRIL) 40 MG tablet Take 1 tablet (40 mg total) by mouth daily. (Patient not taking: Reported on 06/29/2017) 90 tablet 3  . pravastatin (PRAVACHOL) 40 MG tablet Take 1 tablet (40 mg total) by mouth daily. (Patient not taking: Reported on 06/29/2017) 30 tablet 2   No current facility-administered medications on file prior to visit.     Review of Systems  Constitutional: Positive for fatigue. Negative for activity change, appetite change and fever.  Eyes: Negative for visual disturbance.  Respiratory: Negative for cough, chest tightness and shortness of breath.   Cardiovascular: Negative for chest pain, palpitations and leg swelling.  Gastrointestinal: Negative for abdominal pain, nausea and vomiting.  Genitourinary: Negative for dysuria, frequency and urgency.  Skin: Negative for rash.  Neurological: Negative for dizziness, syncope and light-headedness.       Patient reports that she has had some chronic swelling in  her left upper extremity from her stroke.  She cannot move her arm or hand very well.  She is interested in assistive device for this.  Hematological: Negative for adenopathy.     Objective:   BP 138/76 (BP Location: Right Arm, Patient Position: Sitting, Cuff Size: Normal)   Pulse 84   Temp 98.2 F (36.8 C) (Temporal)   Resp 16   Ht 5\' 6"  (1.676 m)   Wt 279 lb 8 oz (126.8 kg)   LMP 09/11/2012   SpO2 97%   BMI 45.11 kg/m   Physical Exam  Constitutional: She appears well-developed and well-nourished.  HENT:  Head: Normocephalic and atraumatic.  Eyes: Pupils are equal, round, and reactive to light. EOM are normal.  Neck: Normal range of motion. Neck supple. No JVD present. No tracheal deviation present. No thyromegaly present.  Cardiovascular: Normal rate, regular rhythm and normal heart sounds.  Lymphadenopathy:    She has no cervical adenopathy.  Skin: Skin is warm.  Psychiatric:  Appropriate dress and grooming.  Mood labile.  Slightly elevated mood.  Insight poor.  Behavior somewhat impulsive.  No evidence of mania.  No suicidal or homicidal ideations.  Thought process is goal-directed without delusions, phobias, obsessions, or compulsions.  Speech normal but when rate increases can be difficult to understand.  Vitals reviewed.    Assessment and Plan  1. Fatigue, unspecified type Patient had previous labs ordered to evaluate her thyroid, electrolytes, and other lab testing today.  We will go ahead and add a CBC to her panel. - CBC with Differential/Platelet  2. Visit for pelvic exam She requests a referral to gynecologist  for evaluation.  [facility] there to be seen. - Ambulatory referral to Obstetrics / Gynecology  3. Gait instability History of gait instability and fall risk secondary to her previous stroke with left-sided hemiparesis.  Order for cane placed today.  Will reprocess order for home health services. -  Ambulatory Referral for DME  4. Tobacco abuse The 5 A's Model for treating Tobacco Use and Dependence was used today. I have identified and documented tobacco use status for this patient. I have urged the patient to quit tobacco use. At this time, the patient is unwilling and not ready to attempt to quit. I have provided patient with information regarding risks, cessation techniques, and interventions that might increase future attempts to quit smoking. I will plan on again addressing tobacco dependence at the next visit.   5. Hyperlipidemia LDL goal <70 Recommend restart pravastatin.  We discussed LDL goal and we discussed the need for cholesterol management secondary to history of stroke.    6. Essential hypertension It is recommended at this time that she restart lisinopril and HCTZ.  Is recommended that she get the labs as requested on multiple other visits.  She is going to the lab at this time to get the blood work done.  Long discussion with patient and her daughter in the office today.  All the referrals and her daughter has questions about have been placed previously.  There are notes in the chart from the offices where attempts to call the patient have been made on multiple occasions.  The referrals that she is questioning have been made by our office on previous visits.  The services that she is requesting have previously been deferred by her mother on multiple occasions.  Patient has been seen in the office several times without her daughter present.  There is documentation on the orthopedic referral that they have attempted to call patient greater than 3 times and the referral was subsequently closed.  This referral will be reopened today and placed.  Daughter was given the number to contact the orthopedic office.  It is recommended that patient follow-up in 2-4 weeks for recheck of blood pressure and review of labs.  There are multiple issues that need to be addressed but there is also not  adequate time to  address all of these issues at one visit.  It is recommended patient follow-up and we will plan on addressing some more issues at that time. Return in about 2 weeks (around 07/13/2017). Aliene Beams, MD 06/29/2017

## 2017-06-29 NOTE — Telephone Encounter (Signed)
Patient came in for her appointment today to discuss an FMLA form for her daughter, [name]. When they arrived, and I brought them back, I verified that Ms [name] was here for form completion. [name] then told me they were here for other reasons and asked 'are we going to even see the doctor?' I asked them what else they needed to discuss to document it and assured them they would see the doctor. Ms [name] told me she needed to discuss some medication side effects. While trying to gather more information from the patient, [name] interrupted informing me that she also needed to discuss with Dr Tracie Harrier the 'unprofessionalism of her office staff.' She then proceeded to ask me for the last names of front desk staff, [place], and office manage Shirlee Limerick. I provided the information. She informed me that the office staff here was very rude, and that she needed the names of the staff because she was going to call 'the higher up,' [employer]. She repeatedly interrupted through out the rooming process to complain about the office staff and our services. I informed Shirlee Limerick and Dr. Tracie Harrier of this.

## 2017-06-30 ENCOUNTER — Encounter: Payer: Self-pay | Admitting: Obstetrics & Gynecology

## 2017-07-04 ENCOUNTER — Telehealth: Payer: Self-pay | Admitting: Family Medicine

## 2017-07-04 DIAGNOSIS — G8194 Hemiplegia, unspecified affecting left nondominant side: Secondary | ICD-10-CM

## 2017-07-04 DIAGNOSIS — R531 Weakness: Secondary | ICD-10-CM

## 2017-07-04 DIAGNOSIS — I639 Cerebral infarction, unspecified: Secondary | ICD-10-CM

## 2017-07-04 NOTE — Telephone Encounter (Signed)
FMLA forms for Daughter [name] Copied, sleeved

## 2017-07-06 NOTE — Telephone Encounter (Signed)
Received fax for clarification.

## 2017-07-10 LAB — COLOGUARD: Cologuard: NEGATIVE

## 2017-07-12 ENCOUNTER — Encounter: Payer: Self-pay | Admitting: Family Medicine

## 2017-08-04 ENCOUNTER — Ambulatory Visit: Payer: Medicaid Other | Admitting: Adult Health

## 2017-08-04 ENCOUNTER — Telehealth: Payer: Self-pay | Admitting: Family Medicine

## 2017-08-04 ENCOUNTER — Encounter (INDEPENDENT_AMBULATORY_CARE_PROVIDER_SITE_OTHER): Payer: Self-pay

## 2017-08-04 ENCOUNTER — Encounter: Payer: Self-pay | Admitting: Adult Health

## 2017-08-04 ENCOUNTER — Other Ambulatory Visit (HOSPITAL_COMMUNITY)
Admission: RE | Admit: 2017-08-04 | Discharge: 2017-08-04 | Disposition: A | Discharge status: Home / Self Care | Payer: Medicaid Other | Source: Ambulatory Visit | Attending: Adult Health | Admitting: Adult Health

## 2017-08-04 VITALS — BP 140/90 | HR 93 | Ht 66.0 in | Wt 250.0 lb

## 2017-08-04 DIAGNOSIS — Z0001 Encounter for general adult medical examination with abnormal findings: Secondary | ICD-10-CM

## 2017-08-04 DIAGNOSIS — Z1212 Encounter for screening for malignant neoplasm of rectum: Secondary | ICD-10-CM

## 2017-08-04 DIAGNOSIS — Z1211 Encounter for screening for malignant neoplasm of colon: Secondary | ICD-10-CM | POA: Diagnosis not present

## 2017-08-04 DIAGNOSIS — Z01419 Encounter for gynecological examination (general) (routine) without abnormal findings: Secondary | ICD-10-CM | POA: Insufficient documentation

## 2017-08-04 DIAGNOSIS — A630 Anogenital (venereal) warts: Secondary | ICD-10-CM | POA: Insufficient documentation

## 2017-08-04 DIAGNOSIS — R42 Dizziness and giddiness: Secondary | ICD-10-CM

## 2017-08-04 DIAGNOSIS — I69398 Other sequelae of cerebral infarction: Secondary | ICD-10-CM | POA: Insufficient documentation

## 2017-08-04 DIAGNOSIS — R2689 Other abnormalities of gait and mobility: Secondary | ICD-10-CM

## 2017-08-04 DIAGNOSIS — G8194 Hemiplegia, unspecified affecting left nondominant side: Secondary | ICD-10-CM

## 2017-08-04 LAB — COMPLETE METABOLIC PANEL WITH GFR
AG RATIO: 1 (calc) (ref 1.0–2.5)
ALT: 25 U/L (ref 6–29)
AST: 26 U/L (ref 10–35)
Albumin: 3.9 g/dL (ref 3.6–5.1)
Alkaline phosphatase (APISO): 96 U/L (ref 33–130)
BUN: 9 mg/dL (ref 7–25)
CALCIUM: 9.6 mg/dL (ref 8.6–10.4)
CHLORIDE: 106 mmol/L (ref 98–110)
CO2: 27 mmol/L (ref 20–32)
Creat: 0.88 mg/dL (ref 0.50–1.05)
GFR, EST AFRICAN AMERICAN: 88 mL/min/{1.73_m2} (ref >=60)
GFR, Est Non African American: 76 mL/min/{1.73_m2} (ref >=60)
GLOBULIN: 4.1 g/dL — AB (ref 1.9–3.7)
Glucose, Bld: 103 mg/dL — ABNORMAL HIGH (ref 65–99)
POTASSIUM: 3.8 mmol/L (ref 3.5–5.3)
SODIUM: 139 mmol/L (ref 135–146)
Total Bilirubin: 0.4 mg/dL (ref 0.2–1.2)
Total Protein: 8 g/dL (ref 6.1–8.1)

## 2017-08-04 LAB — LIPID PANEL
CHOL/HDL RATIO: 5.3 (calc) — AB (ref <5.0)
Cholesterol: 218 mg/dL — ABNORMAL HIGH (ref <200)
HDL: 41 mg/dL — ABNORMAL LOW (ref >50)
LDL Cholesterol (Calc): 158 mg/dL (calc) — ABNORMAL HIGH
NON-HDL CHOLESTEROL (CALC): 177 mg/dL — AB (ref <130)
TRIGLYCERIDES: 88 mg/dL (ref <150)

## 2017-08-04 LAB — HEMOCCULT GUIAC POC 1CARD (OFFICE): Fecal Occult Blood, POC: NEGATIVE

## 2017-08-04 LAB — TSH: TSH: 3.8 m[IU]/L

## 2017-08-04 MED ORDER — MECLIZINE HCL 25 MG PO TABS: 25.0000 mg | ORAL_TABLET | Freq: Three times a day (TID) | ORAL | 1 refills | Status: DC | PRN

## 2017-08-04 MED ORDER — CANE MISC: 1.0000 | 0 refills | Status: DC

## 2017-08-04 NOTE — Progress Notes (Signed)
Patient ID: [name], female   DOB: 1966-01-06, 52 y.o.   MRN: [id number] History of Present Illness: [name] is a 52 year old black female, PM in for a well woman gyn exam and pap, she is a new pt. PCP is Dr Tracie Harrier.   Current Medications, Allergies, Past Medical History, Past Surgical History, Family History and Social History were reviewed in [employer] record.     Review of Systems: Patient denies any headaches, hearing loss, blurred vision, shortness of breath, chest pain, abdominal pain, problems with bowel movements, urination, or intercourse. No joint pain or mood swings. Has decreased energy and is dizzy at times, esp when getting out of car and right elbow hurts at times. She uses a cane, has she had 2 strokes and has some paralysis on the left.    Physical Exam:BP 140/90 (BP Location: Right Arm, Patient Position: Sitting, Cuff Size: Large)   Pulse 93   Ht 5\' 6"  (1.676 m)   Wt 250 lb (113.4 kg)   LMP 09/11/2012   BMI 40.35 kg/m  General:  Well developed, well nourished, no acute distress Skin:  Warm and dry Neck:  Midline trachea, normal thyroid, good ROM, no lymphadenopathy,no carotid bruits heard Lungs; Clear to auscultation bilaterally Breast:  No dominant palpable mass, retraction, or nipple discharge,right nipple inverted and always has been Cardiovascular: Regular rate and rhythm Abdomen:  Soft, non tender, no hepatosplenomegaly,obese Pelvic:  External genitalia is normal in appearance, no lesions.  The vagina is normal in appearance. Urethra has no lesions or masses. The cervix is bulbous. Pap with HPV performed. Uterus is felt to be normal size, shape, and contour.  No adnexal masses or tenderness noted.Bladder is non tender, no masses felt.Difficult exam secondary to abdominal girth.  Rectal: Good sphincter tone, no polyps, or hemorrhoids felt.  Hemoccult negative.She has 8 x 4 cm cluster of warts from anal area on to right inner thigh.Has  irregular mole left inner thigh, Dr 11/11/2012 in for co exam ,will get her back for pre op with him, will require laser removal.She says they do bother her, with sitting and wiping. Extremities/musculoskeletal:  No swelling or varicosities noted, no clubbing or cyanosis Psych:  No mood changes, alert and cooperative,seems happy PHQ 9 score 10, denies being suicidal and declines meds.   Impression: 1. Encounter for gynecological examination with Papanicolaou smear of cervix   2. Screening for colorectal cancer   3. Anogenital (venereal) warts   4. Vertigo       Plan: Meds ordered this encounter  Medications  . meclizine (ANTIVERT) 25 MG tablet    Sig: Take 1 tablet (25 mg total) by mouth 3 (three) times daily as needed for dizziness.    Dispense:  30 tablet    Refill:  1    Order Specific Question:   Supervising Provider    Answer:   Despina Hidden [[id number]]  Get labs per PCP Return in 3 weeks for pre op with Dr [name] Physical in 1 year Pap in 3 if normal Get mammogram yearly And she says she did colo guard recently  Review handout on warts

## 2017-08-04 NOTE — Addendum Note (Signed)
Addended by: Federico Flake A on: 08/04/2017 11:47 AM   Modules accepted: Orders

## 2017-08-04 NOTE — Telephone Encounter (Signed)
I have called [facility] and sent the information again. They assure me they will be in contact with someone in 2-3 hours.

## 2017-08-04 NOTE — Patient Instructions (Signed)
Genital Warts Genital warts are a common STD (sexually transmitted disease). They may appear as small bumps on the tissues of the genital area or anal area. Sometimes, they can become irritated and cause pain. Genital warts are easily passed to other people through sexual contact. Getting treatment is important because genital warts can lead to other problems. In females, the virus that causes genital warts may increase the risk of cervical cancer. What are the causes? Genital warts are caused by a virus that is called human papillomavirus (HPV). HPV is spread by having unprotected sex with an infected person. It can be spread through vaginal, anal, and oral sex. Many people do not know that they are infected. They may be infected for years without problems. However, even if they do not have problems, they can pass the infection to their sexual partners. What increases the risk? Genital warts are more likely to develop in:  People who have unprotected sex.  People who have multiple sexual partners.  People who become sexually active before they are 51 years of age.  Men who are not circumcised.  Women who have a female sexual partner who is not circumcised.  People who have a weakened body defense system (immune system) due to disease or medicine.  People who smoke.  What are the signs or symptoms? Symptoms of genital warts include:  Small growths in the genital area or anal area. These warts often grow in clusters.  Itching and irritation in the genital area or anal area.  Bleeding from the warts.  Painful sexual intercourse.  How is this diagnosed? Genital warts can usually be diagnosed from their appearance on the vagina, vulva, penis, perineum, anus, or rectum. Tests may also be done, such as:  Biopsy. A tissue sample is removed so it can be looked at under a microscope.  Colposcopy. In females, a magnifying tool is used to examine the vagina and cervix. Certain solutions may  be used to make the HPV cells change color so they can be seen more easily.  A Pap test in females.  Tests for other STDs.  How is this treated? Treatment for genital warts may include:  Applying prescription medicines to the warts. These may be solutions or creams.  Freezing the warts with liquid nitrogen (cryotherapy).  Burning the warts with: ? Laser treatment. ? An electrified probe (electrocautery).  Injecting a substance (Candida antigen or Trichophyton antigen) into the warts to help the body's immune system to fight off the warts.  Interferon injections.  Surgery to remove the warts.  Follow these instructions at home: Medicines  Apply over-the-counter and prescription medicines only as told by your health care provider.  Do not treat genital warts with medicines that are used for treating hand warts.  Talk with your health care provider about using over-the-counter anti-itch creams. General instructions  Do not touch or scratch the warts.  Do not have sex until your treatment has been completed.  Tell your current and past sexual partners about your condition because they may also need treatment.  Keep all follow-up visits as told by your health care provider. This is important.  After treatment, use condoms during sex to prevent future infections. Other Instructions for Women  Women who have genital warts might need increased screening for cervical cancer. This type of cancer is slow growing and can be cured if it is found early. Chances of developing cervical cancer are increased with HPV.  If you become pregnant, tell your health   care provider that you have had HPV. Your health care provider will monitor you closely during pregnancy to be sure that your baby is safe. How is this prevented? Talk with your health care provider about getting the HPV vaccines. These vaccines prevent some HPV infections and cancers. It is recommended that the vaccine be given to  males and females who are 9-26 years of age. It will not work if you already have HPV, and it is not recommended for pregnant women. Contact a health care provider if:  You have redness, swelling, or pain in the area of the treated skin.  You have a fever.  You feel generally ill.  You feel lumps in and around your genital area or anal area.  You have bleeding in your genital area or anal area.  You have pain during sexual intercourse. This information is not intended to replace advice given to you by your health care provider. Make sure you discuss any questions you have with your health care provider. Document Released: 03/18/2000 Document Revised: 08/27/2015 Document Reviewed: 06/16/2014 Elsevier Interactive Patient Education  2018 Elsevier Inc.  

## 2017-08-04 NOTE — Telephone Encounter (Signed)
Patients daughter [name] called to check the status of [facility], she states no one has been to her home as of yet. Cb#: [id number]/ [phone]

## 2017-08-05 LAB — HEMOGLOBIN A1C
Hgb A1c MFr Bld: 5.2 % of total Hgb (ref <5.7)
Mean Plasma Glucose: 103 (calc)
eAG (mmol/L): 5.7 (calc)

## 2017-08-08 ENCOUNTER — Encounter: Payer: Self-pay | Admitting: Family Medicine

## 2017-08-08 LAB — CYTOLOGY - PAP
DIAGNOSIS: NEGATIVE
HPV: NOT DETECTED

## 2017-08-09 ENCOUNTER — Telehealth: Payer: Self-pay | Admitting: Adult Health

## 2017-08-09 MED ORDER — METRONIDAZOLE 500 MG PO TABS
500.0000 mg | ORAL_TABLET | Freq: Two times a day (BID) | ORAL | 0 refills | Status: DC
Start: 2017-08-09 — End: 2017-09-08

## 2017-08-09 NOTE — Telephone Encounter (Signed)
Left message to call about pap, was +trich needs to be treated, flagyl sent to [facility] in ,need partners name and dob and drug store, he needs to be treated then no sex, and she can get POC when she sees Dr Despina Hidden on the 24th

## 2017-08-10 ENCOUNTER — Telehealth: Payer: Self-pay | Admitting: Adult Health

## 2017-08-10 NOTE — Telephone Encounter (Signed)
Left message to call about pap 

## 2017-08-14 ENCOUNTER — Telehealth: Payer: Self-pay | Admitting: Adult Health

## 2017-08-14 ENCOUNTER — Encounter: Payer: Self-pay | Admitting: Adult Health

## 2017-08-14 NOTE — Progress Notes (Signed)
Letter sent.

## 2017-08-14 NOTE — Telephone Encounter (Signed)
Will send letter about +trich

## 2017-08-14 NOTE — Telephone Encounter (Signed)
Left message to call me about pap 

## 2017-08-16 ENCOUNTER — Telehealth: Payer: Self-pay | Admitting: Family Medicine

## 2017-08-16 DIAGNOSIS — R531 Weakness: Secondary | ICD-10-CM

## 2017-08-16 DIAGNOSIS — G8194 Hemiplegia, unspecified affecting left nondominant side: Secondary | ICD-10-CM

## 2017-08-16 DIAGNOSIS — R2689 Other abnormalities of gait and mobility: Secondary | ICD-10-CM

## 2017-08-16 DIAGNOSIS — I639 Cerebral infarction, unspecified: Secondary | ICD-10-CM

## 2017-08-16 DIAGNOSIS — Z8673 Personal history of transient ischemic attack (TIA), and cerebral infarction without residual deficits: Secondary | ICD-10-CM

## 2017-08-16 DIAGNOSIS — R29898 Other symptoms and signs involving the musculoskeletal system: Secondary | ICD-10-CM

## 2017-08-16 NOTE — Telephone Encounter (Signed)
Patients daughter [name] called in around 10:49 am to inquire about patients home health referral, she states it has been around 5 months since it was ordered, she says she was suppose to hear back from someone in our office last Friday and no one ever called her. Patient almost fell in the shower recently and really needs assistance around her home.  She is also requesting a phone call to discuss patients lab results.  Cb#: [phone]/ [phone]

## 2017-08-16 NOTE — Telephone Encounter (Signed)
Please check on status of home health referral.  Please see what specific question she has regarding patient labs.  Patient has a follow-up appointment next week and we can discuss her labs at that time.

## 2017-08-16 NOTE — Telephone Encounter (Signed)
I called [facility] and spoke with [place] and she stated they had no order. She could see referral but it had no disciplines listed and had someone (didn't say who) had left a vm for someone (didn't say who, it wasn't documented) asking for a return call and no one called back. I printed order off and faxed it to [phone] and requested a call verifying they received the fax. Will follow up tomorrow on order for PT Call patient's daughter and let her know I had faxed order to the [facility] and apologized for the delay in getting to this with verbal understanding. She asked what labs her mother had drawn and I told her and told her that they would be discussed at the follow up visit and reminded her of the appt date and time.

## 2017-08-17 NOTE — Telephone Encounter (Signed)
Done. Please assist in getting set up. Thanks.

## 2017-08-21 ENCOUNTER — Telehealth: Payer: Self-pay | Admitting: Family Medicine

## 2017-08-21 NOTE — Telephone Encounter (Signed)
Patient needs OT eval of the Left Arm, please call in order and she also wants approval for PT - 2 x wk x 4 wks

## 2017-08-22 NOTE — Telephone Encounter (Signed)
Left message giving verbal order of requested OT eval of the left arm and approval for pt 2 x wk x 4 wks on Amy's vm

## 2017-08-22 NOTE — Telephone Encounter (Signed)
Please give verbal orders regarding PT and OT.  Please check on status of this.

## 2017-08-22 NOTE — Telephone Encounter (Signed)
Sent [name] a message via Epic. Patient should be evaluated in home.

## 2017-08-23 ENCOUNTER — Telehealth: Payer: Self-pay | Admitting: Family Medicine

## 2017-08-23 NOTE — Telephone Encounter (Signed)
Left message on Amy's machine requesting call back.

## 2017-08-23 NOTE — Telephone Encounter (Signed)
[name] w\ [facility] is calling in regards to PT ([phone]) BP meds not at home with her BP was High  170/120,,then 140/100 after a little bit they rechecked it

## 2017-08-24 ENCOUNTER — Ambulatory Visit (INDEPENDENT_AMBULATORY_CARE_PROVIDER_SITE_OTHER): Payer: Medicaid Other | Admitting: Family Medicine

## 2017-08-24 ENCOUNTER — Encounter: Payer: Self-pay | Admitting: Family Medicine

## 2017-08-24 ENCOUNTER — Telehealth: Payer: Self-pay | Admitting: Family Medicine

## 2017-08-24 ENCOUNTER — Other Ambulatory Visit: Payer: Self-pay

## 2017-08-24 VITALS — BP 142/80 | HR 90 | Temp 98.6°F | Resp 18 | Ht 66.0 in | Wt 281.1 lb

## 2017-08-24 DIAGNOSIS — Z720 Tobacco use: Secondary | ICD-10-CM | POA: Diagnosis not present

## 2017-08-24 DIAGNOSIS — I10 Essential (primary) hypertension: Secondary | ICD-10-CM | POA: Diagnosis not present

## 2017-08-24 DIAGNOSIS — E039 Hypothyroidism, unspecified: Secondary | ICD-10-CM | POA: Diagnosis not present

## 2017-08-24 DIAGNOSIS — I639 Cerebral infarction, unspecified: Secondary | ICD-10-CM | POA: Diagnosis not present

## 2017-08-24 MED ORDER — ASPIRIN EC 325 MG PO TBEC
325.0000 mg | DELAYED_RELEASE_TABLET | Freq: Every day | ORAL | 0 refills | Status: DC
Start: 2017-08-24 — End: 2023-08-15

## 2017-08-24 NOTE — Progress Notes (Signed)
Patient ID: [name], female    DOB: 1965-07-05, 52 y.o.   MRN: [id number]  Chief Complaint  Patient presents with  . Hypertension    Allergies Patient has no known allergies.  Subjective:   [name] is a 52 y.o. female who presents to [facility] today.  HPI [name] presents today for follow-up visit for blood pressure.  She is accompanied today by her daughter.  She reports she has been taking her medications.  She is not exactly sure which ones.  She did not bring her medications in today.  Her daughter reports she told her to bring them in but she did not want to bring them.  She finally has it settled for physical therapy to come out to the house.  They are supposed to come out to do physical therapy and Occupational Therapy today.  The home health agency did send someone out to do a home evaluation and see if she needed assistive devices.  She has not yet gotten a cane or the assistive devices that she needed for the bathroom.  Lab tests from the beginning of May were reviewed with patient.  She is not sure she has been taking her cholesterol medication.  She was seen for recent GYN evaluation.  She was diagnosed with trichomonas.  Her partner has not yet been treated.  She reports she is not sexually active.  Is any chest pain, shortness of breath, swelling in her extremities.  Her bowel movements are normal.   Past Medical History:  Diagnosis Date  . High cholesterol   . Hypertension   . Stroke (HCC)   . Vaginal Pap smear, abnormal     Past Surgical History:  Procedure Laterality Date  . DILATION AND CURETTAGE OF UTERUS      Family History  Problem Relation Age of Onset  . Hypertension Mother   . Liver disease Mother   . Heart attack Father   . HIV Sister   . Arrhythmia Brother   . CAD Brother   . Arrhythmia Sister   . Mood Disorder Sister      Social History   Socioeconomic History  . Marital status: Single    Spouse name:  Not on file  . Number of children: Not on file  . Years of education: Not on file  . Highest education level: Not on file  Occupational History  . Not on file  Social Needs  . Financial resource strain: Not on file  . Food insecurity:    Worry: Not on file    Inability: Not on file  . Transportation needs:    Medical: Not on file    Non-medical: Not on file  Tobacco Use  . Smoking status: Heavy Tobacco Smoker    Packs/day: 1.00    Years: 32.00    Pack years: 32.00    Types: Cigarettes  . Smokeless tobacco: Never Used  Substance and Sexual Activity  . Alcohol use: No    Frequency: Never  . Drug use: No  . Sexual activity: Not Currently    Birth control/protection: Post-menopausal  Lifestyle  . Physical activity:    Days per week: Not on file    Minutes per session: Not on file  . Stress: Not on file  Relationships  . Social connections:    Talks on phone: Not on file    Gets together: Not on file    Attends religious service: Not on file  Active member of club or organization: Not on file    Attends meetings of clubs or organizations: Not on file    Relationship status: Not on file  Other Topics Concern  . Not on file  Social History Narrative   Grew up in [place], [place].   Engaged to be married.   5 children.    Eats mainly meat/junk food.   Current Outpatient Medications on File Prior to Visit  Medication Sig Dispense Refill  . hydrochlorothiazide (HYDRODIURIL) 25 MG tablet Take 1 tablet (25 mg total) by mouth daily. 90 tablet 3  . levothyroxine (SYNTHROID, LEVOTHROID) 50 MCG tablet Take 1 tablet (50 mcg total) by mouth daily. 90 tablet 0  . lisinopril (PRINIVIL,ZESTRIL) 40 MG tablet Take 1 tablet (40 mg total) by mouth daily. 90 tablet 3  . meclizine (ANTIVERT) 25 MG tablet Take 1 tablet (25 mg total) by mouth 3 (three) times daily as needed for dizziness. 30 tablet 1  . metroNIDAZOLE (FLAGYL) 500 MG tablet Take 1 tablet (500 mg total) by mouth 2 (two) times  daily. 14 tablet 0  . Misc. Devices (CANE) MISC 1 each by Does not apply route as directed. 1 each 0  . pravastatin (PRAVACHOL) 40 MG tablet Take 1 tablet (40 mg total) by mouth daily. 30 tablet 2   No current facility-administered medications on file prior to visit.     Review of Systems  Constitutional: Negative for activity change, appetite change and fever.  Eyes: Negative for visual disturbance.  Respiratory: Negative for cough, chest tightness and shortness of breath.   Cardiovascular: Negative for chest pain, palpitations and leg swelling.  Gastrointestinal: Negative for abdominal pain, nausea and vomiting.  Genitourinary: Negative for dysuria, frequency and urgency.  Neurological: Negative for dizziness, syncope and light-headedness.  Hematological: Negative for adenopathy.   Current Outpatient Medications on File Prior to Visit  Medication Sig Dispense Refill  . hydrochlorothiazide (HYDRODIURIL) 25 MG tablet Take 1 tablet (25 mg total) by mouth daily. 90 tablet 3  . levothyroxine (SYNTHROID, LEVOTHROID) 50 MCG tablet Take 1 tablet (50 mcg total) by mouth daily. 90 tablet 0  . lisinopril (PRINIVIL,ZESTRIL) 40 MG tablet Take 1 tablet (40 mg total) by mouth daily. 90 tablet 3  . meclizine (ANTIVERT) 25 MG tablet Take 1 tablet (25 mg total) by mouth 3 (three) times daily as needed for dizziness. 30 tablet 1  . metroNIDAZOLE (FLAGYL) 500 MG tablet Take 1 tablet (500 mg total) by mouth 2 (two) times daily. 14 tablet 0  . Misc. Devices (CANE) MISC 1 each by Does not apply route as directed. 1 each 0  . pravastatin (PRAVACHOL) 40 MG tablet Take 1 tablet (40 mg total) by mouth daily. 30 tablet 2   No current facility-administered medications on file prior to visit.      Objective:   BP (!) 142/80 (BP Location: Right Arm, Patient Position: Sitting, Cuff Size: Large)   Pulse 90   Temp 98.6 F (37 C) (Temporal)   Resp 18   Ht 5\' 6"  (1.676 m)   Wt 281 lb 1.9 oz (127.5 kg)   LMP  09/11/2012   SpO2 98%   BMI 45.37 kg/m  Heart rate 72.  Blood pressure 142/86.  Afebrile.  Respiratory rate 16.  Pulse ox 96% Physical Exam  Constitutional: She is oriented to person, place, and time. She appears well-developed and well-nourished.  Eyes: Pupils are equal, round, and reactive to light. Conjunctivae and EOM are normal.  Neck: Normal  range of motion. Neck supple.  Cardiovascular: Normal rate, regular rhythm, normal heart sounds and intact distal pulses.  Pulmonary/Chest: Effort normal and breath sounds normal.  Neurological: She is alert and oriented to person, place, and time.  Skin: Skin is warm and dry. Capillary refill takes less than 2 seconds. No erythema.  Psychiatric: She has a normal mood and affect. Her behavior is normal.  Vitals reviewed.  Depression screen [facility] 2/9 08/24/2017 08/04/2017 05/12/2017  Decreased Interest 2 1 0  Down, Depressed, Hopeless 1 1 0  PHQ - 2 Score 3 2 0  Altered sleeping 1 1 -  Tired, decreased energy 3 3 -  Change in appetite 3 1 -  Feeling bad or failure about yourself  1 1 -  Trouble concentrating 0 1 -  Moving slowly or fidgety/restless 0 1 -  Suicidal thoughts 0 0 -  PHQ-9 Score 11 10 -  Difficult doing work/chores Somewhat difficult Somewhat difficult -    Assessment and Plan  1. Essential hypertension Patient is to continue medicines.  Blood pressure seems to be recently controlled at this time.  Compliance with medication discussed.  Her medications were reviewed in detail and she was given a handout sheet on which medication she should be taking.  Daughter will perform medication reconciliation at home or will have the home health agency to do this.  2.  CVA (cerebrovascular accident) [facility]) Continue current medications and risk factor modification.  Smoking cessation recommended.  She has not yet ready to quit.  Continue statin medication. - aspirin EC 325 MG tablet; Take 1 tablet (325 mg total) by mouth daily.  Dispense: 30  tablet; Refill: 0 Did discuss with patient that her LDL at her last check on Aug 04, 2017 was not controlled.  She reports she is not sure she was taking her medications.  She reports she does have the medication but is not sure she was taking the cholesterol medication. 3. Tobacco abuse The 5 A's Model for treating Tobacco Use and Dependence was used today. I have identified and documented tobacco use status for this patient. I have urged the patient to quit tobacco use. At this time, the patient is unwilling and not ready to attempt to quit. I have provided patient with information regarding risks, cessation techniques, and interventions that might increase future attempts to quit smoking. I will plan on again addressing tobacco dependence at the next visit.  4. Hypothyroidism, unspecified type Stable. Plan to check labs in 2 to 3 months.  Patient reports she does not wish to quit smoking.  She does not feel she has any problems with her mood.  I saw in the computer that [facility] had been trying to contact her.  I did discuss her trichomonas vaginitis diagnosis with her.  We did discuss she needs to take the Flagyl.  I did discuss with her that alcohol cannot be taken with this medication oral make her sick.  I also told her that upon discontinuation of the medication that she may not consume alcohol for 2 days.  She reports she knows this and she voiced understanding. Return in about 1 month (around 09/21/2017) for Blood pressure check. Aliene Beams, MD 08/25/2017

## 2017-08-24 NOTE — Telephone Encounter (Signed)
Please call patient and see what she needed. [name]. Tracie Harrier, MD

## 2017-08-24 NOTE — Telephone Encounter (Signed)
Pt LVM that she  would like Dr Tracie Harrier to give her a call

## 2017-08-25 ENCOUNTER — Encounter: Payer: Medicaid Other | Admitting: Obstetrics & Gynecology

## 2017-08-25 NOTE — Telephone Encounter (Signed)
Called number provided and her daughter answered and gave me her tele number and did not know why she had called. Said she may not answer but I will call her back and see

## 2017-08-29 ENCOUNTER — Telehealth: Payer: Self-pay | Admitting: Family Medicine

## 2017-08-29 ENCOUNTER — Ambulatory Visit: Payer: Medicaid Other | Admitting: Family Medicine

## 2017-08-29 NOTE — Telephone Encounter (Signed)
Patient has since come in to see Dr.Hagler. Had tried several times to reach PT with no success.

## 2017-08-29 NOTE — Telephone Encounter (Signed)
Cat from [facility] is calling for Verbal orders for therapy  Please call her back at [phone]

## 2017-08-29 NOTE — Telephone Encounter (Signed)
I did not get a chance to call this patient back Friday

## 2017-08-29 NOTE — Telephone Encounter (Signed)
Spoke with Cat and gave verbal ok for OT for 2 x week for 3 weeks. Patient will be fitted for splint for left arm. Will work on ROM exercises on left side. Will also work on [employer]

## 2017-09-01 NOTE — Telephone Encounter (Signed)
Called patient to try and find out her reason for calling. No answer. Left message requesting call back.

## 2017-09-05 ENCOUNTER — Telehealth: Payer: Self-pay | Admitting: Family Medicine

## 2017-09-05 NOTE — Telephone Encounter (Signed)
Lynette with [facility] in [place] called trying to locate an order for Occupational Therapy starting 08/22/17. Please fax 336/ [phone] Or call if you do not have this order [phone] ext. [phone]

## 2017-09-06 NOTE — Telephone Encounter (Signed)
lvm requesting order be faxed. Message on vm stated she wouldn't be back in office until 09/12/17.

## 2017-09-08 ENCOUNTER — Encounter: Payer: Self-pay | Admitting: Obstetrics & Gynecology

## 2017-09-08 ENCOUNTER — Ambulatory Visit: Payer: Medicaid Other | Admitting: Obstetrics & Gynecology

## 2017-09-08 ENCOUNTER — Encounter: Payer: Self-pay | Admitting: Family Medicine

## 2017-09-08 VITALS — BP 177/101 | Ht 66.0 in | Wt 281.0 lb

## 2017-09-08 DIAGNOSIS — A630 Anogenital (venereal) warts: Secondary | ICD-10-CM | POA: Diagnosis not present

## 2017-09-08 NOTE — Progress Notes (Signed)
Preoperative History and Physical  [name] is a 52 y.o. G5P5 with Patient's last menstrual period was 09/11/2012. admitted for a laser ablation of condyloma accuminata and excisonal removal of left inner thigh mass.  Pt has pain and discomfort from the warts  PMH:    Past Medical History:  Diagnosis Date  . High cholesterol   . Hypertension   . Stroke (HCC)   . Vaginal Pap smear, abnormal     PSH:     Past Surgical History:  Procedure Laterality Date  . DILATION AND CURETTAGE OF UTERUS      POb/GynH:      OB History    Gravida  5   Para  5   Term      Preterm      AB      Living  5     SAB      TAB      Ectopic      Multiple      Live Births           Obstetric Comments  Menopause, no menses.         SH:   Social History   Tobacco Use  . Smoking status: Current Every Day Smoker    Packs/day: 0.50    Years: 32.00    Pack years: 16.00    Types: Cigarettes  . Smokeless tobacco: Never Used  Substance Use Topics  . Alcohol use: No    Frequency: Never  . Drug use: No    FH:    Family History  Problem Relation Age of Onset  . Hypertension Mother   . Liver disease Mother   . Heart attack Father   . HIV Sister   . Arrhythmia Brother   . CAD Brother   . Arrhythmia Sister   . Mood Disorder Sister      Allergies: No Known Allergies  Medications:       Current Outpatient Medications:  .  aspirin EC 325 MG tablet, Take 1 tablet (325 mg total) by mouth daily., Disp: 30 tablet, Rfl: 0 .  levothyroxine (SYNTHROID, LEVOTHROID) 50 MCG tablet, Take 1 tablet (50 mcg total) by mouth daily., Disp: 90 tablet, Rfl: 0 .  meclizine (ANTIVERT) 25 MG tablet, Take 1 tablet (25 mg total) by mouth 3 (three) times daily as needed for dizziness., Disp: 30 tablet, Rfl: 1 .  Misc. Devices (CANE) MISC, 1 each by Does not apply route as directed., Disp: 1 each, Rfl: 0 .  pravastatin (PRAVACHOL) 40 MG tablet, Take 1 tablet (40 mg total) by mouth daily.,  Disp: 30 tablet, Rfl: 2  Review of Systems:   Review of Systems  No burning with urination, frequency or urgency No nausea, vomiting or diarrhea Nor fever chills or other constitutional symptoms     PHYSICAL EXAM:  Blood pressure (!) 177/101, height 5\' 6"  (1.676 m), weight 281 lb (127.5 kg), last menstrual period 09/11/2012.    Vitals reviewed. Constitutional: She is oriented to person, place, and time. She appears well-developed and well-nourished.  HENT:  Head: Normocephalic and atraumatic.  Right Ear: External ear normal.  Left Ear: External ear normal.  Nose: Nose normal.  Mouth/Throat: Oropharynx is clear and moist.  Eyes: Conjunctivae and EOM are normal. Pupils are equal, round, and reactive to light. Right eye exhibits no discharge. Left eye exhibits no discharge. No scleral icterus.  Neck: Normal range of motion. Neck supple. No tracheal deviation present. No thyromegaly present.  Cardiovascular: Normal rate, regular rhythm, normal heart sounds and intact distal pulses.  Exam reveals no gallop and no friction rub.   No murmur heard. Respiratory: Effort normal and breath sounds normal. No respiratory distress. She has no wheezes. She has no rales. She exhibits no tenderness.  GI: Soft. Bowel sounds are normal. She exhibits no distension and no mass. There is tenderness. There is no rebound and no guarding.  Genitourinary:       Vulva large confluence of condyloma accuminata Vagina is pink moist without discharge Cervix normal in appearance and pap is normal Uterus is not examined Adnexa is not examined .    Labs: No results found for this or any previous visit (from the past 336 hour(s)).  EKG: Orders placed or performed during the hospital encounter of 04/18/14  . ED EKG  . ED EKG  . EKG 12-Lead  . EKG 12-Lead  . EKG    Imaging Studies: No results found.    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 Anogenital (venereal) warts 08/04/2017  . Screening for colorectal cancer 08/04/2017  . Encounter for gynecological examination with Papanicolaou smear of cervix 08/04/2017  . Vertigo 08/04/2017  . Tobacco abuse 06/01/2017  . Hypothyroidism 05/12/2017  . Acute CVA (cerebrovascular accident) (HCC) 06/25/2013  . Tremor of left hand 06/25/2013  . Hemiparesis, left (HCC) 06/25/2013  . Hyperlipidemia LDL goal <70   . Difficulty in walking(719.7) 08/31/2011  . Unstable balance 08/31/2011  . Weakness of left side of body 08/31/2011  . Cerebral infarction (HCC) 04/07/2011  . Carotid artery occlusion 04/07/2011  . Left hand weakness 04/07/2011  . HTN (hypertension) 04/07/2011    Plan: Laser ablation of large vulvar and peri anal condylomata excisional removal of left inner thigh lesion 09/27/2017  [name] 09/08/2017 12:57 PM     Face to face time:  15 minutes  Greater than 50% of the visit time was spent in counseling and coordination of care with the patient.  The summary and outline of the counseling and care coordination is summarized in the note above.   All questions were answered.

## 2017-09-11 NOTE — Telephone Encounter (Signed)
Attempted to call the patient with no answer. 

## 2017-09-13 ENCOUNTER — Telehealth: Payer: Self-pay | Admitting: Family Medicine

## 2017-09-13 NOTE — Telephone Encounter (Signed)
[name] is calling to advised that Home Health is calling her to tell her that her mom needs to be on more blood pressure meds. She is taken 3 per [place], and she was taken a pink pill and PT thru that away as it made her feel weird.  If you need to talk to [place] she is free today at 4:30

## 2017-09-13 NOTE — Telephone Encounter (Signed)
When she was here last time, less than a month ago, she was supposedly on lisinipril and hctz. If you review the note, they were on the medication list. She has been seen by GYN since then, and I do not know if they took them off the med list.   However, she did not bring her medications in to the last visit as requested and she did not call back with her medication list as was requested. I went over her medications last time with her and her daughter and wrote them all out.   If PT or OT is saying that her BP is elevated, then they need to call with readings and also advise that

## 2017-09-13 NOTE — Telephone Encounter (Signed)
See below note. Someone needs to review all her medication and find out what she is and is not taking. She needs to come in for an office visit and bring all her medications.  Please have daughter call with medications that she is taking. See if we need to get a nurse from home health to do a medication check. Janine Limbo. Tracie Harrier, MD

## 2017-09-13 NOTE — Telephone Encounter (Signed)
Spoke with [name] (patient's daughter). She said the physical therapist let her know that her mother needed to be on blood pressure medicine or more. I don't see that the patient is currently taking anything for blood pressure. I told her I would send Dr.Hagler a message and wait for her advice and I would call her back tomorrow. She verbalized understanding.

## 2017-09-14 NOTE — Telephone Encounter (Signed)
Left message on Jessica's (daughter) vm requesting call back.

## 2017-09-18 NOTE — Patient Instructions (Signed)
[name]  09/18/2017     @[email or web address] @   Your procedure is scheduled on  09/27/2017 .  Report to 09/29/2017 at  645   A.M.  Call this number if you have problems the morning of surgery:  [phone]   Remember:  Do not eat or drink after midnight.  You may drink clear liquids until  12 midnight 09/26/2017.  Clear liquids allowed are:                    Water, Juice (non-citric and without pulp), Carbonated beverages, Clear Tea, Black Coffee only, Plain Jell-O only, Gatorade and Plain Popsicles only    Take these medicines the morning of surgery with A SIP OF WATER  Levothyroxine, antivert.    Do not wear jewelry, make-up or nail polish.  Do not wear lotions, powders, or perfumes, or deodorant.  Do not shave 48 hours prior to surgery.  Men may shave face and neck.  Do not bring valuables to the hospital.  [facility] is not responsible for any belongings or valuables.  Contacts, dentures or bridgework may not be worn into surgery.  Leave your suitcase in the car.  After surgery it may be brought to your room.  For patients admitted to the hospital, discharge time will be determined by your treatment team.  Patients discharged the day of surgery will not be allowed to drive home.   Name and phone number of your driver:   family Special instructions:  None  Please read over the following fact sheets that you were given. Anesthesia Post-op Instructions and Care and Recovery After Surgery      Excision of Skin Lesions, Care After Refer to this sheet in the next few weeks. These instructions provide you with information about caring for yourself after your procedure. Your health care provider may also give you more specific instructions. Your treatment has been planned according to current medical practices, but problems sometimes occur. Call your health care provider if you have any problems or questions after your procedure. What can I expect  after the procedure? After your procedure, it is common to have pain or discomfort at the excision site. Follow these instructions at home:  Take over-the-counter and prescription medicines only as told by your health care provider.  Follow instructions from your health care provider about: ? How to take care of your excision site. You should keep the site clean, dry, and protected for at least 48 hours. ? When and how you should change your bandage (dressing). ? When you should remove your dressing. ? Removing whatever was used to close your excision site.  Check the excision area every day for signs of infection. Watch for: ? Redness, swelling, or pain. ? Fluid, blood, or pus.  For bleeding, apply gentle but firm pressure to the area using a folded towel for 20 minutes.  Avoid high-impact exercise and activities until the stitches (sutures) are removed or the area heals.  Follow instructions from your health care provider about how to minimize scarring. Avoid sun exposure until the area has healed. Scarring should lessen over time.  Keep all follow-up visits as told by your health care provider. This is important. Contact a health care provider if:  You have a fever.  You have redness, swelling, or pain at the excision site.  You have fluid, blood, or pus coming from the excision site.  You have ongoing bleeding at the excision site.  You have pain that does not improve in 2-3 days after your procedure.  You notice skin irregularities or changes in sensation. This information is not intended to replace advice given to you by your health care provider. Make sure you discuss any questions you have with your health care provider. Document Released: 08/05/2014 Document Revised: 08/27/2015 Document Reviewed: 05/07/2014 Elsevier Interactive Patient Education  2018 [employer].  Genital Warts Genital warts are small growths in the genital area or anal area. They are caused by a  type of germ (HPV virus). This germ is spread from person to person during sex. It can be spread through vaginal, anal, and oral sex. Genital warts can lead to other problems if they are not treated. Follow these instructions at home: Medicines  Apply over-the-counter and prescription medicines only as told by your doctor.  Do not use medicines that are meant for treating hand warts.  Talk with your doctor about using anti-itch creams. General instructions  Do not touch or scratch the warts.  Do not have sex until your treatment is done.  Tell your current and past sexual partners about your condition. They may need treatment.  Keep all follow-up visits as told by your doctor. This is important.  After treatment, use condoms during sex. Other Instructions for Women  Women who have genital warts may need to be checked more often for cervical cancer.  If you become pregnant, tell your doctor that you have had genital warts. The germ can be passed to the baby. Contact a doctor if:  You have redness, swelling, or pain in the area of the treated skin.  You have a fever.  You feel generally sick.  You feel lumps in and around your genital area or anal area.  You have bleeding in your genital area or anal area.  You have pain during sex. This information is not intended to replace advice given to you by your health care provider. Make sure you discuss any questions you have with your health care provider. Document Released: 06/15/2009 Document Revised: 08/27/2015 Document Reviewed: 06/16/2014 Elsevier Interactive Patient Education  2018 [employer].  General Anesthesia, Adult General anesthesia is the use of medicines to make a person "go to sleep" (be unconscious) for a medical procedure. General anesthesia is often recommended when a procedure:  Is long.  Requires you to be still or in an unusual position.  Is major and can cause you to lose blood.  Is impossible to do  without general anesthesia.  The medicines used for general anesthesia are called general anesthetics. In addition to making you sleep, the medicines:  Prevent pain.  Control your blood pressure.  Relax your muscles.  Tell a health care provider about:  Any allergies you have.  All medicines you are taking, including vitamins, herbs, eye drops, creams, and over-the-counter medicines.  Any problems you or family members have had with anesthetic medicines.  Types of anesthetics you have had in the past.  Any bleeding disorders you have.  Any surgeries you have had.  Any medical conditions you have.  Any history of heart or lung conditions, such as heart failure, sleep apnea, or chronic obstructive pulmonary disease (COPD).  Whether you are pregnant or may be pregnant.  Whether you use tobacco, alcohol, marijuana, or street drugs.  Any history of [employer].  Any history of depression or anxiety. What are the risks? Generally, this is a safe procedure.  However, problems may occur, including:  Allergic reaction to anesthetics.  Lung and heart problems.  Inhaling food or liquids from your stomach into your lungs (aspiration).  Injury to nerves.  Waking up during your procedure and being unable to move (rare).  Extreme agitation or a state of mental confusion (delirium) when you wake up from the anesthetic.  Air in the bloodstream, which can lead to stroke.  These problems are more likely to develop if you are having a major surgery or if you have an advanced medical condition. You can prevent some of these complications by answering all of your health care provider's questions thoroughly and by following all pre-procedure instructions. General anesthesia can cause side effects, including:  Nausea or vomiting  A sore throat from the breathing tube.  Feeling cold or shivery.  Feeling tired, washed out, or achy.  Sleepiness or drowsiness.  Confusion or  agitation.  What happens before the procedure? Staying hydrated Follow instructions from your health care provider about hydration, which may include:  Up to 2 hours before the procedure - you may continue to drink clear liquids, such as water, clear fruit juice, black coffee, and plain tea.  Eating and drinking restrictions Follow instructions from your health care provider about eating and drinking, which may include:  8 hours before the procedure - stop eating heavy meals or foods such as meat, fried foods, or fatty foods.  6 hours before the procedure - stop eating light meals or foods, such as toast or cereal.  6 hours before the procedure - stop drinking milk or drinks that contain milk.  2 hours before the procedure - stop drinking clear liquids.  Medicines  Ask your health care provider about: ? Changing or stopping your regular medicines. This is especially important if you are taking diabetes medicines or blood thinners. ? Taking medicines such as aspirin and ibuprofen. These medicines can thin your blood. Do not take these medicines before your procedure if your health care provider instructs you not to. ? Taking new dietary supplements or medicines. Do not take these during the week before your procedure unless your health care provider approves them.  If you are told to take a medicine or to continue taking a medicine on the day of the procedure, take the medicine with sips of water. General instructions   Ask if you will be going home the same day, the following day, or after a longer hospital stay. ? Plan to have someone take you home. ? Plan to have someone stay with you for the first 24 hours after you leave the hospital or clinic.  For 3-6 weeks before the procedure, try not to use any tobacco products, such as cigarettes, chewing tobacco, and e-cigarettes.  You may brush your teeth on the morning of the procedure, but make sure to spit out the toothpaste. What  happens during the procedure?  You will be given anesthetics through a mask and through an IV tube in one of your veins.  You may receive medicine to help you relax (sedative).  As soon as you are asleep, a breathing tube may be used to help you breathe.  An anesthesia specialist will stay with you throughout the procedure. He or she will help keep you comfortable and safe by continuing to give you medicines and adjusting the amount of medicine that you get. He or she will also watch your blood pressure, pulse, and oxygen levels to make sure that the anesthetics do not  cause any problems.  If a breathing tube was used to help you breathe, it will be removed before you wake up. The procedure may vary among health care providers and hospitals. What happens after the procedure?  You will wake up, often slowly, after the procedure is complete, usually in a recovery area.  Your blood pressure, heart rate, breathing rate, and blood oxygen level will be monitored until the medicines you were given have worn off.  You may be given medicine to help you calm down if you feel anxious or agitated.  If you will be going home the same day, your health care provider may check to make sure you can stand, drink, and urinate.  Your health care providers will treat your pain and side effects before you go home.  Do not drive for 24 hours if you received a sedative.  You may: ? Feel nauseous and vomit. ? Have a sore throat. ? Have mental slowness. ? Feel cold or shivery. ? Feel sleepy. ? Feel tired. ? Feel sore or achy, even in parts of your body where you did not have surgery. This information is not intended to replace advice given to you by your health care provider. Make sure you discuss any questions you have with your health care provider. Document Released: 06/28/2007 Document Revised: 09/01/2015 Document Reviewed: 03/05/2015 Elsevier Interactive Patient Education  2018 [employer]. General  Anesthesia, Adult, Care After These instructions provide you with information about caring for yourself after your procedure. Your health care provider may also give you more specific instructions. Your treatment has been planned according to current medical practices, but problems sometimes occur. Call your health care provider if you have any problems or questions after your procedure. What can I expect after the procedure? After the procedure, it is common to have:  Vomiting.  A sore throat.  Mental slowness.  It is common to feel:  Nauseous.  Cold or shivery.  Sleepy.  Tired.  Sore or achy, even in parts of your body where you did not have surgery.  Follow these instructions at home: For at least 24 hours after the procedure:  Do not: ? Participate in activities where you could fall or become injured. ? Drive. ? Use heavy machinery. ? Drink alcohol. ? Take sleeping pills or medicines that cause drowsiness. ? Make important decisions or sign legal documents. ? Take care of children on your own.  Rest. Eating and drinking  If you vomit, drink water, juice, or soup when you can drink without vomiting.  Drink enough fluid to keep your urine clear or pale yellow.  Make sure you have little or no nausea before eating solid foods.  Follow the diet recommended by your health care provider. General instructions  Have a responsible adult stay with you until you are awake and alert.  Return to your normal activities as told by your health care provider. Ask your health care provider what activities are safe for you.  Take over-the-counter and prescription medicines only as told by your health care provider.  If you smoke, do not smoke without supervision.  Keep all follow-up visits as told by your health care provider. This is important. Contact a health care provider if:  You continue to have nausea or vomiting at home, and medicines are not helpful.  You cannot  drink fluids or start eating again.  You cannot urinate after 8-12 hours.  You develop a skin rash.  You have fever.  You have  increasing redness at the site of your procedure. Get help right away if:  You have difficulty breathing.  You have chest pain.  You have unexpected bleeding.  You feel that you are having a life-threatening or urgent problem. This information is not intended to replace advice given to you by your health care provider. Make sure you discuss any questions you have with your health care provider. Document Released: 06/27/2000 Document Revised: 08/24/2015 Document Reviewed: 03/05/2015 Elsevier Interactive Patient Education  [employer].

## 2017-09-21 ENCOUNTER — Inpatient Hospital Stay (HOSPITAL_COMMUNITY)
Admission: RE | Admit: 2017-09-21 | Discharge: 2017-09-21 | Disposition: A | Discharge status: Home / Self Care | Payer: Medicaid Other | Source: Ambulatory Visit

## 2017-09-22 NOTE — Patient Instructions (Signed)
[name]  09/22/2017     @PREFPERIOPPHARMACY @   Your procedure is scheduled on  09/27/2017   Report to [facility] at  630   A.M.  Call this number if you have problems the morning of surgery:  [phone]   Remember:  Do not eat or drink after midnight.  You may drink clear liquids until  12 midnight 09/26/2017 .  Clear liquids allowed are:                    Water, Juice (non-citric and without pulp), Carbonated beverages, Clear Tea, Black Coffee only, Plain Jell-O only, Gatorade and Plain Popsicles only    Take these medicines the morning of surgery with A SIP OF WATER  Levothyroxine, antivert.    Do not wear jewelry, make-up or nail polish.  Do not wear lotions, powders, or perfumes, or deodorant.  Do not shave 48 hours prior to surgery.  Men may shave face and neck.  Do not bring valuables to the hospital.  [facility] is not responsible for any belongings or valuables.  Contacts, dentures or bridgework may not be worn into surgery.  Leave your suitcase in the car.  After surgery it may be brought to your room.  For patients admitted to the hospital, discharge time will be determined by your treatment team.  Patients discharged the day of surgery will not be allowed to drive home.   Name and phone number of your driver:   family Special instructions:  None  Please read over the following fact sheets that you were given. Anesthesia Post-op Instructions and Care and Recovery After Surgery       Cervical Laser Surgery, Care After This sheet gives you information about how to care for yourself after your procedure. Your health care provider may also give you more specific instructions. If you have problems or questions, contact your health care provider. What can I expect after the procedure? After the procedure, it is common to have:  Pain or discomfort.  Mild cramping.  Bleeding, spotting, or brownish discharge from your vagina.  Follow  these instructions at home: Activity  Return to your normal activities as told by your health care provider. Ask your health care provider what activities are safe for you.  Do not lift anything that is heavier than 10 lb (4.5 kg), or the limit that your health care provider tells you, until he or she says that it is safe.  Do not have sex or put anything in your vagina until your health care provider says it is okay. General instructions  Take over-the-counter and prescription medicines only as told by your health care provider.  Do not drive or use heavy machinery while taking prescription pain medicine.  Wear sanitary pads to protect from bleeding, spotting, and discharge.  Do not use tampons or douche until your health care provider says it is okay.  It is up to you to get the results of your procedure. Ask your health care provider, or the department that is doing the procedure, when your results will be ready.  Keep all follow-up visits as told by your health care provider. This is important. Contact a health care provider if:  Your pain or cramping does not improve.  Your periods are more painful than usual.  You do not get your period as expected. Get help right away if:  You have any symptoms of  infection, such as: ? A fever. ? Chills. ? Discharge that smells bad.  You have severe pain in your abdomen.  You have heavy bleeding from your vagina (more than a normal period).  You have vaginal bleeding with clumps of blood (blood clots). Summary  After this procedure, it is common to have pain or discomfort and mild cramping. It is also common to have bleeding, spotting, or brownish discharge from your vagina.  You may need to wear sanitary pads to protect from bleeding, spotting, and discharge.  Do not have sex, use tampons, or douche until your health care provider says it is okay.  Return to your normal activities as told by your health care provider. Ask your  health care provider what activities are safe for you.  Take over-the-counter and prescription medicines only as told by your health care provider. These include medicines for pain. This information is not intended to replace advice given to you by your health care provider. Make sure you discuss any questions you have with your health care provider. Document Released: 02/08/2016 Document Revised: 02/08/2016 Document Reviewed: 02/08/2016 Elsevier Interactive Patient Education  2018 [employer].  Genital Warts Genital warts are a common STD (sexually transmitted disease). They may appear as small bumps on the tissues of the genital area or anal area. Sometimes, they can become irritated and cause pain. Genital warts are easily passed to other people through sexual contact. Getting treatment is important because genital warts can lead to other problems. In females, the virus that causes genital warts may increase the risk of cervical cancer. What are the causes? Genital warts are caused by a virus that is called human papillomavirus (HPV). HPV is spread by having unprotected sex with an infected person. It can be spread through vaginal, anal, and oral sex. Many people do not know that they are infected. They may be infected for years without problems. However, even if they do not have problems, they can pass the infection to their sexual partners. What increases the risk? Genital warts are more likely to develop in:  People who have unprotected sex.  People who have multiple sexual partners.  People who become sexually active before they are 51 years of age.  Men who are not circumcised.  Women who have a female sexual partner who is not circumcised.  People who have a weakened body defense system (immune system) due to disease or medicine.  People who smoke.  What are the signs or symptoms? Symptoms of genital warts include:  Small growths in the genital area or anal area. These warts  often grow in clusters.  Itching and irritation in the genital area or anal area.  Bleeding from the warts.  Painful sexual intercourse.  How is this diagnosed? Genital warts can usually be diagnosed from their appearance on the vagina, vulva, penis, perineum, anus, or rectum. Tests may also be done, such as:  Biopsy. A tissue sample is removed so it can be looked at under a microscope.  Colposcopy. In females, a magnifying tool is used to examine the vagina and cervix. Certain solutions may be used to make the HPV cells change color so they can be seen more easily.  A Pap test in females.  Tests for other STDs.  How is this treated? Treatment for genital warts may include:  Applying prescription medicines to the warts. These may be solutions or creams.  Freezing the warts with liquid nitrogen (cryotherapy).  Burning the warts with: ? Laser treatment. ?  An electrified probe (electrocautery).  Injecting a substance (Candida antigen or Trichophyton antigen) into the warts to help the body's immune system to fight off the warts.  Interferon injections.  Surgery to remove the warts.  Follow these instructions at home: Medicines  Apply over-the-counter and prescription medicines only as told by your health care provider.  Do not treat genital warts with medicines that are used for treating hand warts.  Talk with your health care provider about using over-the-counter anti-itch creams. General instructions  Do not touch or scratch the warts.  Do not have sex until your treatment has been completed.  Tell your current and past sexual partners about your condition because they may also need treatment.  Keep all follow-up visits as told by your health care provider. This is important.  After treatment, use condoms during sex to prevent future infections. Other Instructions for Women  Women who have genital warts might need increased screening for cervical cancer. This  type of cancer is slow growing and can be cured if it is found early. Chances of developing cervical cancer are increased with HPV.  If you become pregnant, tell your health care provider that you have had HPV. Your health care provider will monitor you closely during pregnancy to be sure that your baby is safe. How is this prevented? Talk with your health care provider about getting the HPV vaccines. These vaccines prevent some HPV infections and cancers. It is recommended that the vaccine be given to males and females who are 22-62 years of age. It will not work if you already have HPV, and it is not recommended for pregnant women. Contact a health care provider if:  You have redness, swelling, or pain in the area of the treated skin.  You have a fever.  You feel generally ill.  You feel lumps in and around your genital area or anal area.  You have bleeding in your genital area or anal area.  You have pain during sexual intercourse. This information is not intended to replace advice given to you by your health care provider. Make sure you discuss any questions you have with your health care provider. Document Released: 03/18/2000 Document Revised: 08/27/2015 Document Reviewed: 06/16/2014 Elsevier Interactive Patient Education  2018 [employer].  Genital Warts Genital warts are small growths in the genital area or anal area. They are caused by a type of germ (HPV virus). This germ is spread from person to person during sex. It can be spread through vaginal, anal, and oral sex. Genital warts can lead to other problems if they are not treated. Follow these instructions at home: Medicines  Apply over-the-counter and prescription medicines only as told by your doctor.  Do not use medicines that are meant for treating hand warts.  Talk with your doctor about using anti-itch creams. General instructions  Do not touch or scratch the warts.  Do not have sex until your treatment is  done.  Tell your current and past sexual partners about your condition. They may need treatment.  Keep all follow-up visits as told by your doctor. This is important.  After treatment, use condoms during sex. Other Instructions for Women  Women who have genital warts may need to be checked more often for cervical cancer.  If you become pregnant, tell your doctor that you have had genital warts. The germ can be passed to the baby. Contact a doctor if:  You have redness, swelling, or pain in the area of the treated  skin.  You have a fever.  You feel generally sick.  You feel lumps in and around your genital area or anal area.  You have bleeding in your genital area or anal area.  You have pain during sex. This information is not intended to replace advice given to you by your health care provider. Make sure you discuss any questions you have with your health care provider. Document Released: 06/15/2009 Document Revised: 08/27/2015 Document Reviewed: 06/16/2014 Elsevier Interactive Patient Education  2018 [employer].  General Anesthesia, Adult General anesthesia is the use of medicines to make a person "go to sleep" (be unconscious) for a medical procedure. General anesthesia is often recommended when a procedure:  Is long.  Requires you to be still or in an unusual position.  Is major and can cause you to lose blood.  Is impossible to do without general anesthesia.  The medicines used for general anesthesia are called general anesthetics. In addition to making you sleep, the medicines:  Prevent pain.  Control your blood pressure.  Relax your muscles.  Tell a health care provider about:  Any allergies you have.  All medicines you are taking, including vitamins, herbs, eye drops, creams, and over-the-counter medicines.  Any problems you or family members have had with anesthetic medicines.  Types of anesthetics you have had in the past.  Any bleeding disorders  you have.  Any surgeries you have had.  Any medical conditions you have.  Any history of heart or lung conditions, such as heart failure, sleep apnea, or chronic obstructive pulmonary disease (COPD).  Whether you are pregnant or may be pregnant.  Whether you use tobacco, alcohol, marijuana, or street drugs.  Any history of Financial planner.  Any history of depression or anxiety. What are the risks? Generally, this is a safe procedure. However, problems may occur, including:  Allergic reaction to anesthetics.  Lung and heart problems.  Inhaling food or liquids from your stomach into your lungs (aspiration).  Injury to nerves.  Waking up during your procedure and being unable to move (rare).  Extreme agitation or a state of mental confusion (delirium) when you wake up from the anesthetic.  Air in the bloodstream, which can lead to stroke.  These problems are more likely to develop if you are having a major surgery or if you have an advanced medical condition. You can prevent some of these complications by answering all of your health care provider's questions thoroughly and by following all pre-procedure instructions. General anesthesia can cause side effects, including:  Nausea or vomiting  A sore throat from the breathing tube.  Feeling cold or shivery.  Feeling tired, washed out, or achy.  Sleepiness or drowsiness.  Confusion or agitation.  What happens before the procedure? Staying hydrated Follow instructions from your health care provider about hydration, which may include:  Up to 2 hours before the procedure - you may continue to drink clear liquids, such as water, clear fruit juice, black coffee, and plain tea.  Eating and drinking restrictions Follow instructions from your health care provider about eating and drinking, which may include:  8 hours before the procedure - stop eating heavy meals or foods such as meat, fried foods, or fatty foods.  6  hours before the procedure - stop eating light meals or foods, such as toast or cereal.  6 hours before the procedure - stop drinking milk or drinks that contain milk.  2 hours before the procedure - stop drinking clear liquids.  Medicines  Ask your health care provider about: ? Changing or stopping your regular medicines. This is especially important if you are taking diabetes medicines or blood thinners. ? Taking medicines such as aspirin and ibuprofen. These medicines can thin your blood. Do not take these medicines before your procedure if your health care provider instructs you not to. ? Taking new dietary supplements or medicines. Do not take these during the week before your procedure unless your health care provider approves them.  If you are told to take a medicine or to continue taking a medicine on the day of the procedure, take the medicine with sips of water. General instructions   Ask if you will be going home the same day, the following day, or after a longer hospital stay. ? Plan to have someone take you home. ? Plan to have someone stay with you for the first 24 hours after you leave the hospital or clinic.  For 3-6 weeks before the procedure, try not to use any tobacco products, such as cigarettes, chewing tobacco, and e-cigarettes.  You may brush your teeth on the morning of the procedure, but make sure to spit out the toothpaste. What happens during the procedure?  You will be given anesthetics through a mask and through an IV tube in one of your veins.  You may receive medicine to help you relax (sedative).  As soon as you are asleep, a breathing tube may be used to help you breathe.  An anesthesia specialist will stay with you throughout the procedure. He or she will help keep you comfortable and safe by continuing to give you medicines and adjusting the amount of medicine that you get. He or she will also watch your blood pressure, pulse, and oxygen levels to  make sure that the anesthetics do not cause any problems.  If a breathing tube was used to help you breathe, it will be removed before you wake up. The procedure may vary among health care providers and hospitals. What happens after the procedure?  You will wake up, often slowly, after the procedure is complete, usually in a recovery area.  Your blood pressure, heart rate, breathing rate, and blood oxygen level will be monitored until the medicines you were given have worn off.  You may be given medicine to help you calm down if you feel anxious or agitated.  If you will be going home the same day, your health care provider may check to make sure you can stand, drink, and urinate.  Your health care providers will treat your pain and side effects before you go home.  Do not drive for 24 hours if you received a sedative.  You may: ? Feel nauseous and vomit. ? Have a sore throat. ? Have mental slowness. ? Feel cold or shivery. ? Feel sleepy. ? Feel tired. ? Feel sore or achy, even in parts of your body where you did not have surgery. This information is not intended to replace advice given to you by your health care provider. Make sure you discuss any questions you have with your health care provider. Document Released: 06/28/2007 Document Revised: 09/01/2015 Document Reviewed: 03/05/2015 Elsevier Interactive Patient Education  2018 [employer]. General Anesthesia, Adult, Care After These instructions provide you with information about caring for yourself after your procedure. Your health care provider may also give you more specific instructions. Your treatment has been planned according to current medical practices, but problems sometimes occur. Call your health care provider if  you have any problems or questions after your procedure. What can I expect after the procedure? After the procedure, it is common to have:  Vomiting.  A sore throat.  Mental slowness.  It is common to  feel:  Nauseous.  Cold or shivery.  Sleepy.  Tired.  Sore or achy, even in parts of your body where you did not have surgery.  Follow these instructions at home: For at least 24 hours after the procedure:  Do not: ? Participate in activities where you could fall or become injured. ? Drive. ? Use heavy machinery. ? Drink alcohol. ? Take sleeping pills or medicines that cause drowsiness. ? Make important decisions or sign legal documents. ? Take care of children on your own.  Rest. Eating and drinking  If you vomit, drink water, juice, or soup when you can drink without vomiting.  Drink enough fluid to keep your urine clear or pale yellow.  Make sure you have little or no nausea before eating solid foods.  Follow the diet recommended by your health care provider. General instructions  Have a responsible adult stay with you until you are awake and alert.  Return to your normal activities as told by your health care provider. Ask your health care provider what activities are safe for you.  Take over-the-counter and prescription medicines only as told by your health care provider.  If you smoke, do not smoke without supervision.  Keep all follow-up visits as told by your health care provider. This is important. Contact a health care provider if:  You continue to have nausea or vomiting at home, and medicines are not helpful.  You cannot drink fluids or start eating again.  You cannot urinate after 8-12 hours.  You develop a skin rash.  You have fever.  You have increasing redness at the site of your procedure. Get help right away if:  You have difficulty breathing.  You have chest pain.  You have unexpected bleeding.  You feel that you are having a life-threatening or urgent problem. This information is not intended to replace advice given to you by your health care provider. Make sure you discuss any questions you have with your health care  provider. Document Released: 06/27/2000 Document Revised: 08/24/2015 Document Reviewed: 03/05/2015 Elsevier Interactive Patient Education  [employer].

## 2017-09-25 ENCOUNTER — Other Ambulatory Visit: Payer: Self-pay | Admitting: Obstetrics & Gynecology

## 2017-09-25 NOTE — Telephone Encounter (Signed)
Left message on daughters vm requesting call back.

## 2017-09-26 ENCOUNTER — Encounter (HOSPITAL_COMMUNITY): Payer: Self-pay

## 2017-09-26 ENCOUNTER — Telehealth: Payer: Self-pay

## 2017-09-26 ENCOUNTER — Other Ambulatory Visit: Payer: Self-pay

## 2017-09-26 ENCOUNTER — Encounter (HOSPITAL_COMMUNITY)
Admission: RE | Admit: 2017-09-26 | Discharge: 2017-09-26 | Disposition: A | Discharge status: Home / Self Care | Payer: Medicaid Other | Source: Ambulatory Visit | Attending: Obstetrics & Gynecology | Admitting: Obstetrics & Gynecology

## 2017-09-26 DIAGNOSIS — E785 Hyperlipidemia, unspecified: Secondary | ICD-10-CM | POA: Diagnosis not present

## 2017-09-26 DIAGNOSIS — I10 Essential (primary) hypertension: Secondary | ICD-10-CM | POA: Diagnosis not present

## 2017-09-26 DIAGNOSIS — Z8673 Personal history of transient ischemic attack (TIA), and cerebral infarction without residual deficits: Secondary | ICD-10-CM | POA: Insufficient documentation

## 2017-09-26 DIAGNOSIS — F17210 Nicotine dependence, cigarettes, uncomplicated: Secondary | ICD-10-CM | POA: Insufficient documentation

## 2017-09-26 DIAGNOSIS — Z01812 Encounter for preprocedural laboratory examination: Secondary | ICD-10-CM | POA: Insufficient documentation

## 2017-09-26 DIAGNOSIS — A630 Anogenital (venereal) warts: Secondary | ICD-10-CM | POA: Insufficient documentation

## 2017-09-26 HISTORY — DX: Hypothyroidism, unspecified: E03.9

## 2017-09-26 LAB — HCG, QUANTITATIVE, PREGNANCY: hCG, Beta Chain, Quant, S: <1 m[IU]/mL (ref <5)

## 2017-09-26 LAB — COMPREHENSIVE METABOLIC PANEL
ALT: 24 U/L (ref 0–44)
ANION GAP: 7 (ref 5–15)
AST: 24 U/L (ref 15–41)
Albumin: 3.5 g/dL (ref 3.5–5.0)
Alkaline Phosphatase: 87 U/L (ref 38–126)
BILIRUBIN TOTAL: 0.5 mg/dL (ref 0.3–1.2)
BUN: 9 mg/dL (ref 6–20)
CALCIUM: 8.9 mg/dL (ref 8.9–10.3)
CO2: 25 mmol/L (ref 22–32)
Chloride: 106 mmol/L (ref 98–111)
Creatinine, Ser: 0.71 mg/dL (ref 0.44–1.00)
GFR calc Af Amer: >60 mL/min (ref >60)
GLUCOSE: 116 mg/dL — AB (ref 70–99)
POTASSIUM: 3.6 mmol/L (ref 3.5–5.1)
Sodium: 138 mmol/L (ref 135–145)
Total Protein: 8.3 g/dL — ABNORMAL HIGH (ref 6.5–8.1)

## 2017-09-26 LAB — CBC
HEMATOCRIT: 37.7 % (ref 36.0–46.0)
Hemoglobin: 12.3 g/dL (ref 12.0–15.0)
MCH: 28.5 pg (ref 26.0–34.0)
MCHC: 32.6 g/dL (ref 30.0–36.0)
MCV: 87.5 fL (ref 78.0–100.0)
Platelets: 361 10*3/uL (ref 150–400)
RBC: 4.31 MIL/uL (ref 3.87–5.11)
RDW: 14.1 % (ref 11.5–15.5)
WBC: 7.5 10*3/uL (ref 4.0–10.5)

## 2017-09-26 LAB — RAPID HIV SCREEN (HIV 1/2 AB+AG)
HIV 1/2 Antibodies: NONREACTIVE
HIV-1 P24 Antigen - HIV24: NONREACTIVE

## 2017-09-26 LAB — URINALYSIS, ROUTINE W REFLEX MICROSCOPIC
Bilirubin Urine: NEGATIVE
Glucose, UA: NEGATIVE mg/dL
Hgb urine dipstick: NEGATIVE
Ketones, ur: NEGATIVE mg/dL
LEUKOCYTES UA: NEGATIVE
NITRITE: NEGATIVE
PH: 5 (ref 5.0–8.0)
Protein, ur: NEGATIVE mg/dL
SPECIFIC GRAVITY, URINE: 1.021 (ref 1.005–1.030)

## 2017-09-26 NOTE — Telephone Encounter (Signed)
[facility] left Msg

## 2017-09-26 NOTE — Telephone Encounter (Signed)
Patients daughter walked into the office and spoke with me. She stated her mother is no longer taking the Lisinopril or HCTZ. She is only taking the 3 medications. I advised her that her mother needs to come in for medication management and bring all of her medicines. We put her on the cancellation list and she scheduled an appointment to see Dr.Hagler. She was made aware that Dr.Hagler will be leaving the practice in September.

## 2017-09-27 ENCOUNTER — Encounter (HOSPITAL_COMMUNITY)
Admission: RE | Disposition: A | Discharge status: Home / Self Care | Payer: Self-pay | Source: Ambulatory Visit | Attending: Obstetrics & Gynecology

## 2017-09-27 ENCOUNTER — Encounter (HOSPITAL_COMMUNITY): Payer: Self-pay

## 2017-09-27 ENCOUNTER — Ambulatory Visit (HOSPITAL_COMMUNITY): Payer: Medicaid Other | Admitting: Anesthesiology

## 2017-09-27 ENCOUNTER — Ambulatory Visit (HOSPITAL_COMMUNITY)
Admission: RE | Admit: 2017-09-27 | Discharge: 2017-09-27 | Disposition: A | Discharge status: Home / Self Care | Payer: Medicaid Other | Source: Ambulatory Visit | Attending: Obstetrics & Gynecology | Admitting: Obstetrics & Gynecology

## 2017-09-27 ENCOUNTER — Telehealth: Payer: Self-pay

## 2017-09-27 DIAGNOSIS — F17210 Nicotine dependence, cigarettes, uncomplicated: Secondary | ICD-10-CM | POA: Insufficient documentation

## 2017-09-27 DIAGNOSIS — Z8249 Family history of ischemic heart disease and other diseases of the circulatory system: Secondary | ICD-10-CM | POA: Diagnosis not present

## 2017-09-27 DIAGNOSIS — I10 Essential (primary) hypertension: Secondary | ICD-10-CM | POA: Diagnosis not present

## 2017-09-27 DIAGNOSIS — Z7982 Long term (current) use of aspirin: Secondary | ICD-10-CM | POA: Insufficient documentation

## 2017-09-27 DIAGNOSIS — Z79899 Other long term (current) drug therapy: Secondary | ICD-10-CM | POA: Diagnosis not present

## 2017-09-27 DIAGNOSIS — Z8673 Personal history of transient ischemic attack (TIA), and cerebral infarction without residual deficits: Secondary | ICD-10-CM | POA: Insufficient documentation

## 2017-09-27 DIAGNOSIS — E039 Hypothyroidism, unspecified: Secondary | ICD-10-CM | POA: Diagnosis not present

## 2017-09-27 DIAGNOSIS — A630 Anogenital (venereal) warts: Secondary | ICD-10-CM | POA: Diagnosis not present

## 2017-09-27 DIAGNOSIS — E7800 Pure hypercholesterolemia, unspecified: Secondary | ICD-10-CM | POA: Diagnosis not present

## 2017-09-27 DIAGNOSIS — I739 Peripheral vascular disease, unspecified: Secondary | ICD-10-CM | POA: Diagnosis not present

## 2017-09-27 HISTORY — PX: MASS EXCISION: SHX2000

## 2017-09-27 HISTORY — PX: LASER ABLATION CONDOLAMATA: SHX5941

## 2017-09-27 SURGERY — ABLATION, CONDYLOMA, USING LASER
Anesthesia: General | Site: Thigh | Laterality: Left

## 2017-09-27 MED ORDER — ONDANSETRON HCL 4 MG/2ML IJ SOLN: 4.0000 mg | Freq: Once | INTRAMUSCULAR | Status: DC | PRN

## 2017-09-27 MED ORDER — SUCCINYLCHOLINE CHLORIDE 20 MG/ML IJ SOLN
INTRAMUSCULAR | Status: AC
  Filled 2017-09-27: qty 1

## 2017-09-27 MED ORDER — MIDAZOLAM HCL 5 MG/5ML IJ SOLN
INTRAMUSCULAR | Status: DC | PRN
  Administered 2017-09-27: 2 mg via INTRAVENOUS

## 2017-09-27 MED ORDER — KETOROLAC TROMETHAMINE 30 MG/ML IJ SOLN
30.0000 mg | Freq: Once | INTRAMUSCULAR | Status: AC
  Administered 2017-09-27: 30 mg via INTRAVENOUS
  Filled 2017-09-27: qty 1

## 2017-09-27 MED ORDER — LIDOCAINE HCL (PF) 1 % IJ SOLN
INTRAMUSCULAR | Status: AC
  Filled 2017-09-27: qty 5

## 2017-09-27 MED ORDER — SEVOFLURANE IN SOLN
RESPIRATORY_TRACT | Status: AC
  Filled 2017-09-27: qty 250

## 2017-09-27 MED ORDER — DEXTROSE 5 % IV SOLN
3.0000 g | INTRAVENOUS | Status: AC
  Administered 2017-09-27: 3 g via INTRAVENOUS
  Filled 2017-09-27: qty 3000

## 2017-09-27 MED ORDER — LACTATED RINGERS IV SOLN
INTRAVENOUS | Status: DC | PRN
  Administered 2017-09-27: 08:00:00 via INTRAVENOUS

## 2017-09-27 MED ORDER — SILVER SULFADIAZINE 1 % EX CREA: TOPICAL_CREAM | CUTANEOUS | 11 refills | Status: DC

## 2017-09-27 MED ORDER — HYDROCODONE-ACETAMINOPHEN 7.5-325 MG PO TABS: 1.0000 | ORAL_TABLET | Freq: Once | ORAL | Status: DC | PRN

## 2017-09-27 MED ORDER — LIDOCAINE HCL 1 % IJ SOLN
INTRAMUSCULAR | Status: DC | PRN
  Administered 2017-09-27: 30 mg via INTRADERMAL

## 2017-09-27 MED ORDER — ONDANSETRON 8 MG PO TBDP: 8.0000 mg | ORAL_TABLET | Freq: Three times a day (TID) | ORAL | 0 refills | Status: DC | PRN

## 2017-09-27 MED ORDER — HYDROCODONE-ACETAMINOPHEN 5-325 MG PO TABS: 1.0000 | ORAL_TABLET | Freq: Four times a day (QID) | ORAL | 0 refills | Status: DC | PRN

## 2017-09-27 MED ORDER — ONDANSETRON HCL 4 MG/2ML IJ SOLN
INTRAMUSCULAR | Status: DC | PRN
  Administered 2017-09-27: 4 mg via INTRAVENOUS

## 2017-09-27 MED ORDER — SILVER SULFADIAZINE 1 % EX CREA
1.0000 "application " | TOPICAL_CREAM | Freq: Every day | CUTANEOUS | Status: DC
  Filled 2017-09-27: qty 85

## 2017-09-27 MED ORDER — FENTANYL CITRATE (PF) 100 MCG/2ML IJ SOLN
INTRAMUSCULAR | Status: DC | PRN
  Administered 2017-09-27: 50 ug via INTRAVENOUS
  Administered 2017-09-27 (×2): 25 ug via INTRAVENOUS

## 2017-09-27 MED ORDER — LACTATED RINGERS IV SOLN: INTRAVENOUS | Status: DC

## 2017-09-27 MED ORDER — MEPERIDINE HCL 50 MG/ML IJ SOLN: 6.2500 mg | INTRAMUSCULAR | Status: DC | PRN

## 2017-09-27 MED ORDER — MIDAZOLAM HCL 2 MG/2ML IJ SOLN
INTRAMUSCULAR | Status: AC
  Filled 2017-09-27: qty 2

## 2017-09-27 MED ORDER — WATER FOR IRRIGATION, STERILE IR SOLN
Status: DC | PRN
  Administered 2017-09-27: 1000 mL

## 2017-09-27 MED ORDER — FENTANYL CITRATE (PF) 100 MCG/2ML IJ SOLN
INTRAMUSCULAR | Status: AC
  Filled 2017-09-27: qty 2

## 2017-09-27 MED ORDER — SILVER SULFADIAZINE 1 % EX CREA
TOPICAL_CREAM | CUTANEOUS | Status: AC
  Filled 2017-09-27: qty 50

## 2017-09-27 MED ORDER — FENTANYL CITRATE (PF) 100 MCG/2ML IJ SOLN
INTRAMUSCULAR | Status: AC
Start: 2017-09-27 — End: ?
  Filled 2017-09-27: qty 2

## 2017-09-27 MED ORDER — KETOROLAC TROMETHAMINE 10 MG PO TABS: 10.0000 mg | ORAL_TABLET | Freq: Three times a day (TID) | ORAL | 0 refills | Status: DC | PRN

## 2017-09-27 MED ORDER — SILVER SULFADIAZINE 1 % EX CREA
TOPICAL_CREAM | CUTANEOUS | Status: DC | PRN
  Administered 2017-09-27: 1 via TOPICAL

## 2017-09-27 MED ORDER — KETOROLAC TROMETHAMINE 30 MG/ML IJ SOLN: 30.0000 mg | Freq: Once | INTRAMUSCULAR | Status: DC | PRN

## 2017-09-27 MED ORDER — PROPOFOL 10 MG/ML IV BOLUS
INTRAVENOUS | Status: DC | PRN
  Administered 2017-09-27: 20 mg via INTRAVENOUS
  Administered 2017-09-27: 160 mg via INTRAVENOUS

## 2017-09-27 MED ORDER — HYDROMORPHONE HCL 1 MG/ML IJ SOLN: 0.2500 mg | INTRAMUSCULAR | Status: DC | PRN

## 2017-09-27 MED ORDER — PROPOFOL 10 MG/ML IV BOLUS
INTRAVENOUS | Status: AC
  Filled 2017-09-27: qty 40

## 2017-09-27 SURGICAL SUPPLY — 25 items
CLOTH BEACON ORANGE TIMEOUT ST (SAFETY) ×3 IMPLANT
COVER LIGHT HANDLE STERIS (MISCELLANEOUS) ×6 IMPLANT
ELECT REM PT RETURN 9FT ADLT (ELECTROSURGICAL) ×3
ELECTRODE REM PT RTRN 9FT ADLT (ELECTROSURGICAL) ×2 IMPLANT
GAUZE SPONGE 4X4 16PLY XRAY LF (GAUZE/BANDAGES/DRESSINGS) ×3 IMPLANT
GLOVE BIOGEL PI IND STRL 7.0 (GLOVE) ×2 IMPLANT
GLOVE BIOGEL PI IND STRL 8 (GLOVE) ×2 IMPLANT
GLOVE BIOGEL PI INDICATOR 7.0 (GLOVE) ×1
GLOVE BIOGEL PI INDICATOR 8 (GLOVE) ×1
GLOVE ECLIPSE 8.0 STRL XLNG CF (GLOVE) ×3 IMPLANT
GOWN STRL REUS W/TWL LRG LVL3 (GOWN DISPOSABLE) ×6 IMPLANT
GOWN STRL REUS W/TWL XL LVL3 (GOWN DISPOSABLE) ×3 IMPLANT
KIT TURNOVER KIT A (KITS) ×3 IMPLANT
LASER FIBER DISP 1000U (UROLOGICAL SUPPLIES) ×3 IMPLANT
MANIFOLD NEPTUNE II (INSTRUMENTS) ×3 IMPLANT
NS IRRIG 1000ML POUR BTL (IV SOLUTION) ×3 IMPLANT
PACK BASIC III (CUSTOM PROCEDURE TRAY) ×1
PACK SRG BSC III STRL LF ECLPS (CUSTOM PROCEDURE TRAY) ×2 IMPLANT
PAD ARMBOARD 7.5X6 YLW CONV (MISCELLANEOUS) ×3 IMPLANT
PREFILTER SMOKE EVAC (FILTER) ×3 IMPLANT
SCOPETTES 8  STERILE (MISCELLANEOUS) ×1
SCOPETTES 8 STERILE (MISCELLANEOUS) ×2 IMPLANT
SUT ETHILON 3 0 PS 1 18 (SUTURE) ×6 IMPLANT
TUBING SMOKE EVAC CO2 (TUBING) ×3 IMPLANT
WATER STERILE IRR 1000ML POUR (IV SOLUTION) ×3 IMPLANT

## 2017-09-27 NOTE — Op Note (Signed)
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  The areas were on both sides of the perineum buttock perianal area and were at least 3 cm x 3 cm on each side.  She also had one on her left inner thigh.  There were no other vulvar vaginal condyloma identified  Description of operation: Patient was taken to the operating room placed in the supine position where she underwent general tracheal anesthesia She was placed in dorsal lithotomy position prepped and draped in usual sterile fashion Holmium laser was employed placed on a power setting of 3 and a rate of 20 and took about 20 minutes or so to completely ablate the condylomatous lesions down to the dermis The lesion in the left inner thigh was excised in elliptical fashion and closed with 3 interrupted 3-0 Ethilon sutures with good hemostasis  There was no blood loss during the procedure  Silvadene cream was placed on the laser bed lesions at the end of the procedure  The patient was awakened from anesthesia taken recovery in good stable condition all counts were correct x3  Patient received Ancef and Toradol preoperatively  [name], MD 09/27/2017 10:03 AM

## 2017-09-27 NOTE — Transfer of Care (Signed)
Immediate Anesthesia Transfer of Care Note  Patient: [name]  Procedure(s) Performed: LASER ABLATION CONDYLOMA AND REMOVAL OF PERIANAL WART (N/A ) EXCISION LESION LEFT THIGH (N/A )  Patient Location: PACU  Anesthesia Type:General  Level of Consciousness: awake and patient cooperative  Airway & Oxygen Therapy: Patient Spontanous Breathing and Patient connected to face mask oxygen  Post-op Assessment: Report given to RN, Post -op Vital signs reviewed and stable and Patient moving all extremities  Post vital signs: Reviewed and stable  Last Vitals:  Vitals Value Taken Time  BP    Temp    Pulse    Resp    SpO2      Last Pain:  Vitals:   09/27/17 0749  TempSrc: Oral  PainSc: 0-No pain      Patients Stated Pain Goal: 6 (09/27/17 0749)  Complications: No apparent anesthesia complications

## 2017-09-27 NOTE — Anesthesia Preprocedure Evaluation (Signed)
Anesthesia Evaluation  Patient identified by MRN, date of birth, ID band Patient awake    Reviewed: Allergy & Precautions, H&P , NPO status , Patient's Chart, lab work & pertinent test results, reviewed documented beta blocker date and time   Airway Mallampati: II  TM Distance: >3 FB Neck ROM: full    Dental no notable dental hx.    Pulmonary neg pulmonary ROS, Current Smoker,    Pulmonary exam normal breath sounds clear to auscultation       Cardiovascular Exercise Tolerance: Good hypertension, + Peripheral Vascular Disease  negative cardio ROS   Rhythm:regular Rate:Normal     Neuro/Psych CVA negative neurological ROS  negative psych ROS   GI/Hepatic negative GI ROS, Neg liver ROS,   Endo/Other  negative endocrine ROSHypothyroidism   Renal/GU negative Renal ROS  negative genitourinary   Musculoskeletal   Abdominal   Peds  Hematology negative hematology ROS (+)   Anesthesia Other Findings   Reproductive/Obstetrics negative OB ROS                             Anesthesia Physical Anesthesia Plan  ASA: III  Anesthesia Plan: General   Post-op Pain Management:    Induction:   PONV Risk Score and Plan:   Airway Management Planned:   Additional Equipment:   Intra-op Plan:   Post-operative Plan:   Informed Consent: I have reviewed the patients History and Physical, chart, labs and discussed the procedure including the risks, benefits and alternatives for the proposed anesthesia with the patient or authorized representative who has indicated his/her understanding and acceptance.   Dental Advisory Given  Plan Discussed with: CRNA  Anesthesia Plan Comments:         Anesthesia Quick Evaluation

## 2017-09-27 NOTE — Discharge Instructions (Signed)
Genital Warts Genital warts are small growths in the genital area or anal area. They are caused by a type of germ (HPV virus). This germ is spread from person to person during sex. It can be spread through vaginal, anal, and oral sex. Genital warts can lead to other problems if they are not treated. Follow these instructions at home: Medicines  Apply over-the-counter and prescription medicines only as told by your doctor.  Do not use medicines that are meant for treating hand warts.  Talk with your doctor about using anti-itch creams. General instructions  Do not touch or scratch the warts.  Do not have sex until your treatment is done.  Tell your current and past sexual partners about your condition. They may need treatment.  Keep all follow-up visits as told by your doctor. This is important.  After treatment, use condoms during sex. Other Instructions for Women  Women who have genital warts may need to be checked more often for cervical cancer.  If you become pregnant, tell your doctor that you have had genital warts. The germ can be passed to the baby. Contact a doctor if:  You have redness, swelling, or pain in the area of the treated skin.  You have a fever.  You feel generally sick.  You feel lumps in and around your genital area or anal area.  You have bleeding in your genital area or anal area.  You have pain during sex. This information is not intended to replace advice given to you by your health care provider. Make sure you discuss any questions you have with your health care provider. Document Released: 06/15/2009 Document Revised: 08/27/2015 Document Reviewed: 06/16/2014 Elsevier Interactive Patient Education  2018 [employer]  Monitored Anesthesia Care, Care After These instructions provide you with information about caring for yourself after your procedure. Your health care provider may also give you more specific instructions. Your treatment has been  planned according to current medical practices, but problems sometimes occur. Call your health care provider if you have any problems or questions after your procedure. What can I expect after the procedure? After your procedure, it is common to:  Feel sleepy for several hours.  Feel clumsy and have poor balance for several hours.  Feel forgetful about what happened after the procedure.  Have poor judgment for several hours.  Feel nauseous or vomit.  Have a sore throat if you had a breathing tube during the procedure.  Follow these instructions at home: For at least 24 hours after the procedure:   Do not: ? Participate in activities in which you could fall or become injured. ? Drive. ? Use heavy machinery. ? Drink alcohol. ? Take sleeping pills or medicines that cause drowsiness. ? Make important decisions or sign legal documents. ? Take care of children on your own.  Rest. Eating and drinking  Follow the diet that is recommended by your health care provider.  If you vomit, drink water, juice, or soup when you can drink without vomiting.  Make sure you have little or no nausea before eating solid foods. General instructions  Have a responsible adult stay with you until you are awake and alert.  Take over-the-counter and prescription medicines only as told by your health care provider.  If you smoke, do not smoke without supervision.  Keep all follow-up visits as told by your health care provider. This is important. Contact a health care provider if:  You keep feeling nauseous or you keep vomiting.  You feel light-headed.  You develop a rash.  You have a fever. Get help right away if:  You have trouble breathing. This information is not intended to replace advice given to you by your health care provider. Make sure you discuss any questions you have with your health care provider. Document Released: 07/12/2015 Document Revised: 11/11/2015 Document Reviewed:  07/12/2015 Elsevier Interactive Patient Education  [employer].

## 2017-09-27 NOTE — Anesthesia Postprocedure Evaluation (Signed)
Anesthesia Post Note  Patient: [name]  Procedure(s) Performed: LASER ABLATION CONDYLOMA AND REMOVAL OF PERIANAL WART (N/A ) EXCISION LESION LEFT THIGH (N/A )  Patient location during evaluation: PACU Anesthesia Type: General Level of consciousness: awake and alert and patient cooperative Pain management: pain level controlled Vital Signs Assessment: post-procedure vital signs reviewed and stable Respiratory status: spontaneous breathing, nonlabored ventilation and respiratory function stable Cardiovascular status: blood pressure returned to baseline Postop Assessment: no apparent nausea or vomiting Anesthetic complications: no     Last Vitals:  Vitals:   09/27/17 0749  BP: 136/69  Pulse: 87  Resp: 20  Temp: 36.9 C  SpO2: 100%    Last Pain:  Vitals:   09/27/17 0749  TempSrc: Oral  PainSc: 0-No pain                 Kashena Novitski J

## 2017-09-27 NOTE — Interval H&P Note (Signed)
History and Physical Interval Note:  09/27/2017 8:16 AM  [name]  has presented today for surgery, with the diagnosis of Anogenital Wart  The various methods of treatment have been discussed with the patient and family. After consideration of risks, benefits and other options for treatment, the patient has consented to  Procedure(s): LASER ABLATION CONDYLOMA AND REMOVAL OF PERIANAL WART (N/A) EXCISION LESION LEFT THIGH (N/A) as a surgical intervention .  The patient's history has been reviewed, patient examined, no change in status, stable for surgery.  I have reviewed the patient's chart and labs.  Questions were answered to the patient's satisfaction.     [name]

## 2017-09-27 NOTE — H&P (Signed)
Preoperative History and Physical  [name] is a 52 y.o. G5P5 with Patient's last menstrual period was 09/11/2012. admitted for a laser ablation of condyloma accuminata and excisonal removal of left inner thigh mass.  Pt has pain and discomfort from the warts  PMH:        Past Medical History:  Diagnosis Date  . High cholesterol   . Hypertension   . Stroke (HCC)   . Vaginal Pap smear, abnormal     PSH:     Past Surgical History:  Procedure Laterality Date  . DILATION AND CURETTAGE OF UTERUS      POb/GynH:              OB History    Gravida  5   Para  5   Term      Preterm      AB      Living  5     SAB      TAB      Ectopic      Multiple      Live Births           Obstetric Comments  Menopause, no menses.         SH:   Social History        Tobacco Use  . Smoking status: Current Every Day Smoker    Packs/day: 0.50    Years: 32.00    Pack years: 16.00    Types: Cigarettes  . Smokeless tobacco: Never Used  Substance Use Topics  . Alcohol use: No    Frequency: Never  . Drug use: No    FH:         Family History  Problem Relation Age of Onset  . Hypertension Mother   . Liver disease Mother   . Heart attack Father   . HIV Sister   . Arrhythmia Brother   . CAD Brother   . Arrhythmia Sister   . Mood Disorder Sister      Allergies: No Known Allergies  Medications:       Current Outpatient Medications:  .  aspirin EC 325 MG tablet, Take 1 tablet (325 mg total) by mouth daily., Disp: 30 tablet, Rfl: 0 .  levothyroxine (SYNTHROID, LEVOTHROID) 50 MCG tablet, Take 1 tablet (50 mcg total) by mouth daily., Disp: 90 tablet, Rfl: 0 .  meclizine (ANTIVERT) 25 MG tablet, Take 1 tablet (25 mg total) by mouth 3 (three) times daily as needed for dizziness., Disp: 30 tablet, Rfl: 1 .  Misc. Devices (CANE) MISC, 1 each by Does not apply route as directed., Disp: 1 each, Rfl: 0 .  pravastatin  (PRAVACHOL) 40 MG tablet, Take 1 tablet (40 mg total) by mouth daily., Disp: 30 tablet, Rfl: 2  Review of Systems:   Review of Systems  No burning with urination, frequency or urgency No nausea, vomiting or diarrhea Nor fever chills or other constitutional symptoms     PHYSICAL EXAM:  Blood pressure (!) 177/101, height 5\' 6"  (1.676 m), weight 281 lb (127.5 kg), last menstrual period 09/11/2012.    Vitals reviewed. Constitutional: She is oriented to person, place, and time. She appears well-developed and well-nourished.  HENT:  Head: Normocephalic and atraumatic.  Right Ear: External ear normal.  Left Ear: External ear normal.  Nose: Nose normal.  Mouth/Throat: Oropharynx is clear and moist.  Eyes: Conjunctivae and EOM are normal. Pupils are equal, round, and reactive to light. Right eye exhibits no discharge. Left  eye exhibits no discharge. No scleral icterus.  Neck: Normal range of motion. Neck supple. No tracheal deviation present. No thyromegaly present.  Cardiovascular: Normal rate, regular rhythm, normal heart sounds and intact distal pulses.  Exam reveals no gallop and no friction rub.   No murmur heard. Respiratory: Effort normal and breath sounds normal. No respiratory distress. She has no wheezes. She has no rales. She exhibits no tenderness.  GI: Soft. Bowel sounds are normal. She exhibits no distension and no mass. There is tenderness. There is no rebound and no guarding.  Genitourinary:       Vulva large confluence of condyloma accuminata Vagina is pink moist without discharge Cervix normal in appearance and pap is normal Uterus is not examined Adnexa is not examined .    Labs: No results found for this or any previous visit (from the past 336 hour(s)).  EKG:    Orders placed or performed during the hospital encounter of 04/18/14  . ED EKG  . ED EKG  . EKG 12-Lead  . EKG 12-Lead  . EKG    Imaging Studies: ImagingResults  No results  found.      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 Anogenital (venereal) warts 08/04/2017  . Screening for colorectal cancer 08/04/2017  . Encounter for gynecological examination with Papanicolaou smear of cervix 08/04/2017  . Vertigo 08/04/2017  . Tobacco abuse 06/01/2017  . Hypothyroidism 05/12/2017  . Acute CVA (cerebrovascular accident) (HCC) 06/25/2013  . Tremor of left hand 06/25/2013  . Hemiparesis, left (HCC) 06/25/2013  . Hyperlipidemia LDL goal <70   . Difficulty in walking(719.7) 08/31/2011  . Unstable balance 08/31/2011  . Weakness of left side of body 08/31/2011  . Cerebral infarction (HCC) 04/07/2011  . Carotid artery occlusion 04/07/2011  . Left hand weakness 04/07/2011  . HTN (hypertension) 04/07/2011    Plan: Laser ablation of large vulvar and peri anal condylomata excisional removal of left inner thigh lesion 09/27/2017  [name] 09/08/2017 12:57 PM

## 2017-09-27 NOTE — Anesthesia Procedure Notes (Signed)
Procedure Name: LMA Insertion Date/Time: 09/27/2017 8:44 AM Performed by: Despina Hidden, CRNA Pre-anesthesia Checklist: Patient identified, Patient being monitored, Emergency Drugs available, Timeout performed and Suction available Patient Re-evaluated:Patient Re-evaluated prior to induction Oxygen Delivery Method: Circle System Utilized Preoxygenation: Pre-oxygenation with 100% oxygen Induction Type: IV induction Ventilation: Mask ventilation without difficulty LMA: LMA inserted LMA Size: 4.0 Number of attempts: 1 Placement Confirmation: positive ETCO2 and breath sounds checked- equal and bilateral

## 2017-09-27 NOTE — Telephone Encounter (Signed)
Mailed and faxed [place] [facility] Request for Prior Approval CMN/PA to [facility].

## 2017-09-28 ENCOUNTER — Encounter (HOSPITAL_COMMUNITY): Payer: Self-pay | Admitting: Obstetrics & Gynecology

## 2017-10-02 NOTE — Progress Notes (Addendum)
  October 16, 2017  Patient: [name]  Date of Birth: [birth year]  Date of Visit:  09/27/2017    To Whom It May Concern:  [name] was seen and treated in our surgery department on 09/27/2017. Learta Codding drove her mother to her appointments on June 25 and 26.    Sincerely,  Short Stay Staff

## 2017-10-02 NOTE — Progress Notes (Signed)
  September 26, 2017  Patient: [name]  Date of Birth: [birth year]  Date of Visit:  09/26/2017    To Whom It May Concern:  [name] was seen and treated in our surgery department on 09/26/2017. [name]  Can return to work on 09/28/2017.  Sincerely,  Staff

## 2017-10-06 ENCOUNTER — Encounter: Payer: Medicaid Other | Admitting: Obstetrics & Gynecology

## 2017-10-10 ENCOUNTER — Encounter (HOSPITAL_COMMUNITY): Payer: Self-pay | Admitting: *Deleted

## 2017-10-13 ENCOUNTER — Encounter: Payer: Self-pay | Admitting: Obstetrics & Gynecology

## 2017-10-13 ENCOUNTER — Ambulatory Visit: Payer: Medicaid Other | Admitting: Obstetrics & Gynecology

## 2017-10-13 ENCOUNTER — Ambulatory Visit: Payer: Medicaid Other | Admitting: Family Medicine

## 2017-10-13 ENCOUNTER — Other Ambulatory Visit: Payer: Self-pay

## 2017-10-13 VITALS — BP 136/91 | HR 87 | Ht 66.0 in | Wt 281.0 lb

## 2017-10-13 DIAGNOSIS — Z9889 Other specified postprocedural states: Secondary | ICD-10-CM

## 2017-10-13 MED ORDER — HYDROCODONE-ACETAMINOPHEN 5-325 MG PO TABS: 1.0000 | ORAL_TABLET | Freq: Four times a day (QID) | ORAL | 0 refills | Status: DC | PRN

## 2017-10-13 NOTE — Progress Notes (Signed)
  HPI: Patient returns for routine postoperative follow-up having undergone laser abltion of CA on 10/04/2017.  The patient's immediate postoperative recovery has been unremarkable. Since hospital discharge the patient reports normal post op.   Current Outpatient Medications: aspirin EC 325 MG tablet, Take 1 tablet (325 mg total) by mouth daily., Disp: 30 tablet, Rfl: 0 levothyroxine (SYNTHROID, LEVOTHROID) 50 MCG tablet, Take 1 tablet (50 mcg total) by mouth daily., Disp: 90 tablet, Rfl: 0 meclizine (ANTIVERT) 25 MG tablet, Take 1 tablet (25 mg total) by mouth 3 (three) times daily as needed for dizziness., Disp: 30 tablet, Rfl: 1 Misc. Devices (CANE) MISC, 1 each by Does not apply route as directed., Disp: 1 each, Rfl: 0 ondansetron (ZOFRAN ODT) 8 MG disintegrating tablet, Take 1 tablet (8 mg total) by mouth every 8 (eight) hours as needed for nausea or vomiting., Disp: 20 tablet, Rfl: 0 pravastatin (PRAVACHOL) 40 MG tablet, Take 1 tablet (40 mg total) by mouth daily., Disp: 30 tablet, Rfl: 2 silver sulfADIAZINE (SILVADENE) 1 % cream, Apply 3-4 times daily to wound, Disp: 50 g, Rfl: 11 HYDROcodone-acetaminophen (NORCO/VICODIN) 5-325 MG tablet, Take 1 tablet by mouth every 6 (six) hours as needed. (Patient not taking: Reported on 10/13/2017), Disp: 15 tablet, Rfl: 0 ketorolac (TORADOL) 10 MG tablet, Take 1 tablet (10 mg total) by mouth every 8 (eight) hours as needed. (Patient not taking: Reported on 10/13/2017), Disp: 15 tablet, Rfl: 0  No current facility-administered medications for this visit.     Blood pressure (!) 136/91, pulse 87, height 5\' 6"  (1.676 m), weight 281 lb (127.5 kg), last menstrual period 09/11/2012.  Physical Exam: Laser of vulva is healing well no evidence of infection  Diagnostic Tests:   Pathology: CA  Impression: S/p laser ablation of CA  Plan: Continue silvadene 3-4 times per day  Follow up: 3  weeks  11/11/2012, MD

## 2017-10-23 ENCOUNTER — Other Ambulatory Visit: Payer: Self-pay | Admitting: Obstetrics & Gynecology

## 2017-11-03 ENCOUNTER — Encounter: Payer: Medicaid Other | Admitting: Obstetrics & Gynecology

## 2017-11-03 ENCOUNTER — Encounter: Payer: Self-pay | Admitting: *Deleted

## 2017-11-10 ENCOUNTER — Telehealth: Payer: Self-pay

## 2017-11-10 NOTE — Telephone Encounter (Signed)
2nd attempt - [facility]   

## 2017-11-21 ENCOUNTER — Telehealth: Payer: Self-pay

## 2017-11-21 NOTE — Telephone Encounter (Signed)
3rd attempt - [facility]  

## 2017-12-01 ENCOUNTER — Telehealth: Payer: Self-pay

## 2017-12-01 NOTE — Telephone Encounter (Signed)
Several attempts have been made to contact patient without success. Patient will be placed on the inactive list.  If services are needed again.  Please contact [facility] at [phone].    Information will be routed to the PCP and Dr. Hisada  

## 2018-02-16 DIAGNOSIS — E785 Hyperlipidemia, unspecified: Secondary | ICD-10-CM | POA: Diagnosis present

## 2018-02-16 DIAGNOSIS — E782 Mixed hyperlipidemia: Secondary | ICD-10-CM | POA: Diagnosis present

## 2018-02-16 DIAGNOSIS — Z6841 Body Mass Index (BMI) 40.0 and over, adult: Secondary | ICD-10-CM | POA: Insufficient documentation

## 2018-02-16 DIAGNOSIS — I69359 Hemiplegia and hemiparesis following cerebral infarction affecting unspecified side: Secondary | ICD-10-CM | POA: Insufficient documentation

## 2018-02-16 DIAGNOSIS — I69398 Other sequelae of cerebral infarction: Secondary | ICD-10-CM | POA: Insufficient documentation

## 2018-02-18 DIAGNOSIS — M129 Arthropathy, unspecified: Secondary | ICD-10-CM | POA: Insufficient documentation

## 2020-06-19 ENCOUNTER — Other Ambulatory Visit: Payer: Self-pay

## 2020-06-19 ENCOUNTER — Encounter: Payer: Self-pay | Admitting: Internal Medicine

## 2020-06-19 ENCOUNTER — Ambulatory Visit: Payer: Medicaid Other | Admitting: Internal Medicine

## 2020-06-19 VITALS — BP 133/81 | HR 102 | Resp 18

## 2020-06-19 DIAGNOSIS — E559 Vitamin D deficiency, unspecified: Secondary | ICD-10-CM

## 2020-06-19 DIAGNOSIS — M059 Rheumatoid arthritis with rheumatoid factor, unspecified: Secondary | ICD-10-CM | POA: Diagnosis not present

## 2020-06-19 DIAGNOSIS — Z79899 Other long term (current) drug therapy: Secondary | ICD-10-CM | POA: Diagnosis not present

## 2020-06-19 DIAGNOSIS — G8194 Hemiplegia, unspecified affecting left nondominant side: Secondary | ICD-10-CM | POA: Diagnosis not present

## 2020-06-19 LAB — CBC WITH DIFFERENTIAL/PLATELET
Lymphs Abs: 1156 cells/uL (ref 850–3900)
MPV: 10.2 fL (ref 7.5–12.5)

## 2020-06-19 NOTE — Progress Notes (Signed)
Office Visit Note  Patient: [name]             Date of Birth: 07-19-1965           MRN: [id number]             PCP: Bernerd Limbo, MD Referring: Bernerd Limbo, MD Visit Date: 06/19/2020   Subjective:  New Patient (Initial Visit) (Arthritis )   History of Present Illness: [name] is a 55 y.o. female with history of hypothyroidism, CVA with residual left hemiparesis, HTN, HLD here for evaluation of arthritis of multiple joints and positive rheumatoid factor. She has symptoms for some amount of time at least 6 months, swelling, and stiffness of joints on the right half of her body including elbow, wrist, fingers, and knee. She was evaluated in PCP clinic with highly positive inflammatory markers and RA antibodies with swollen joints noted on exam in 11/2019. She also had positive ANA and RNP antibodies. Xray of the right hand was checked and reportedly not show other cause of joint pain and no erosion. She has used some topical diclofenac without much improvement and tried tramadol also without much improvement. She has become unable to walk much due to severe pain bearing weight on her right knee and the left leg is too weak. Today she says symptoms are doing better compared to 2 months ago severity. She denies any complaints of hair loss, rashes, lymphadenopathy, oral ulcers, raynaud's.   Labs reviewed 11/2019 ESR 114 CRP 36 RF 421.6 CCP >250 ANA pos RNP >8.0 DsDNA, Sm, SSA, SSB neg Vit D 12.7 TSH 4.850   Activities of Daily Living:  Patient reports morning stiffness for 24 hours.   Patient Denies nocturnal pain.  Difficulty dressing/grooming: Reports Difficulty climbing stairs: Reports Difficulty getting out of chair: Reports Difficulty using hands for taps, buttons, cutlery, and/or writing: Reports  Review of Systems  Constitutional: Positive for fatigue.  HENT: Positive for mouth dryness.   Eyes: Positive for dryness.  Respiratory: Positive for shortness of  breath.   Cardiovascular: Positive for swelling in legs/feet.  Gastrointestinal: Negative for constipation.  Endocrine: Positive for cold intolerance and heat intolerance.  Genitourinary: Negative for difficulty urinating.  Musculoskeletal: Positive for arthralgias, gait problem, joint pain, joint swelling, muscle weakness, morning stiffness and muscle tenderness.  Skin: Negative for rash.  Allergic/Immunologic: Negative for susceptible to infections.  Neurological: Positive for numbness and weakness.  Hematological: Negative for bruising/bleeding tendency.  Psychiatric/Behavioral: Positive for sleep disturbance.    PMFS History:  Patient Active Problem List   Diagnosis Date Noted  . Seropositive rheumatoid arthritis ([place]) 06/19/2020  . Long-term use of high-risk medication 06/19/2020  . Vitamin D deficiency 06/19/2020  . Anogenital (venereal) warts 08/04/2017  . Screening for colorectal cancer 08/04/2017  . Encounter for gynecological examination with Papanicolaou smear of cervix 08/04/2017  . Vertigo 08/04/2017  . Tobacco abuse 06/01/2017  . Hypothyroidism 05/12/2017  . Acute CVA (cerebrovascular accident) ([place]) 06/25/2013  . Tremor of left hand 06/25/2013  . Hemiparesis, left ([place]) 06/25/2013  . Hyperlipidemia LDL goal <70   . Difficulty in walking(719.7) 08/31/2011  . Unstable balance 08/31/2011  . Weakness of left side of body 08/31/2011  . Cerebral infarction ([place]) 04/07/2011  . Carotid artery occlusion 04/07/2011  . Left hand weakness 04/07/2011  . HTN (hypertension) 04/07/2011    Past Medical History:  Diagnosis Date  . High cholesterol   . Hypertension   . Hypothyroidism   . Stroke [facility])  weakness on left side.  . Vaginal Pap smear, abnormal     Family History  Problem Relation Age of Onset  . Hypertension Mother   . Liver disease Mother   . Heart attack Father   . HIV Sister   . Arrhythmia Brother   . CAD Brother   . Arrhythmia Sister   . Mood  Disorder Sister    Past Surgical History:  Procedure Laterality Date  . DILATION AND CURETTAGE OF UTERUS    . LASER ABLATION CONDOLAMATA Bilateral 09/27/2017   Procedure: LASER ABLATION CONDYLOMA AND REMOVAL OF PERIANAL WART;  Surgeon: Florian Buff, MD;  Location: [facility];  Service: Gynecology;  Laterality: Bilateral;  . MASS EXCISION Left 09/27/2017   Procedure: EXCISION LESION LEFT THIGH;  Surgeon: Florian Buff, MD;  Location: [facility];  Service: Gynecology;  Laterality: Left;   Social History   Social History Narrative   Grew up in [place], [place].   Engaged to be married.   5 children.    Eats mainly meat/junk food.   Immunization History  Administered Date(s) Administered  . PFIZER(Purple Top)SARS-COV-2 Vaccination 12/12/2019, 01/02/2020     Objective: Vital Signs: BP 133/81 (BP Location: Right Arm, Patient Position: Sitting, Cuff Size: Normal)   Pulse (!) 102   Resp 18   LMP 09/11/2012    Physical Exam Constitutional:      Appearance: She is obese.  HENT:     Right Ear: External ear normal.     Left Ear: External ear normal.     Mouth/Throat:     Mouth: Mucous membranes are moist.     Pharynx: Oropharynx is clear.  Eyes:     Conjunctiva/sclera: Conjunctivae normal.  Cardiovascular:     Rate and Rhythm: Regular rhythm. Tachycardia present.  Pulmonary:     Effort: Pulmonary effort is normal.     Breath sounds: Normal breath sounds.  Skin:    General: Skin is warm and dry.     Findings: No rash.  Neurological:     Mental Status: She is alert.     Comments: Left hemiparesis with 2/5 strength  Psychiatric:        Mood and Affect: Mood normal.     Musculoskeletal Exam:  Neck full ROM no tenderness Shoulders right full ROM left restricted with decrease in all planes Elbow right reduced extension to about 140 degrees with swelling no tenderness, left restricted both directions, no swelling or tenderness Wrists right reduced dorsiflexion ROM swelling and  tenderness, left partially contracted in near neutral position Right 2-3rd digits swollen MCPs and PIPs, 5th digit boutonierre's deformity and tenderness without palpable synovitis, flexor tendon nodule on 2nd digit, left hand restricted movement with pain extending fingers and contracted in partly flexed position Knee swelling and tenderness with reduced flexion ROM   CDAI Exam: CDAI Score: 25  Patient Global: 60 mm; Provider Global: 50 mm Swollen: 7 ; Tender: 7  Joint Exam 06/19/2020      Right  Left  Elbow  Swollen      Wrist  Swollen Tender     MCP 2  Swollen Tender     MCP 3  Swollen Tender     PIP 2  Swollen Tender     PIP 3  Swollen Tender     PIP 5   Tender     Knee  Swollen Tender        Investigation: No additional findings.  Imaging: No results found.  Recent Labs:  Lab Results  Component Value Date   WBC 7.5 09/26/2017   HGB 12.3 09/26/2017   PLT 361 09/26/2017   NA 138 09/26/2017   K 3.6 09/26/2017   CL 106 09/26/2017   CO2 25 09/26/2017   GLUCOSE 116 (H) 09/26/2017   BUN 9 09/26/2017   CREATININE 0.71 09/26/2017   BILITOT 0.5 09/26/2017   ALKPHOS 87 09/26/2017   AST 24 09/26/2017   ALT 24 09/26/2017   PROT 8.3 (H) 09/26/2017   ALBUMIN 3.5 09/26/2017   CALCIUM 8.9 09/26/2017   GFRAA >60 09/26/2017    Speciality Comments: No specialty comments available.  Procedures:  No procedures performed Allergies: Lisinopril   Assessment / Plan:     Visit Diagnoses: Seropositive rheumatoid arthritis ([place])  Symptoms are consistent with RA high disease activity. Right side symptom predominance due to left hemiparesis. Recommend starting methotrexate first line she prefers pills. Do not recommend initial steroids due to comorbidities may increase risk of complications. Will start 15 mg PO weekly and folic acid 1 mg PO daily assuming normal baseline labs.  Long-term use of high-risk medication - Plan: CBC with Differential/Platelet, COMPLETE METABOLIC PANEL  WITH GFR, Hepatitis panel, acute, QuantiFERON-TB Gold Plus  Methotrexat requires monitoring for liver toxicity and cytopenias checking baseline CBC, CMP, hepatitis panel, and checking TB also for consideration of if future DMARDs required.  Vitamin D deficiency - Plan: VITAMIN D 25 Hydroxy (Vit-D Deficiency, Fractures)  Vitamin D deficiency in 11/2019 will see if replacement has been adequate by now. She is very increased risk for osteoporosis and fractures with RA and stroke and wheelchair use.  Orders: Orders Placed This Encounter  Procedures  . CBC with Differential/Platelet  . COMPLETE METABOLIC PANEL WITH GFR  . Hepatitis panel, acute  . QuantiFERON-TB Gold Plus  . VITAMIN D 25 Hydroxy (Vit-D Deficiency, Fractures)   No orders of the defined types were placed in this encounter.    Follow-Up Instructions: Return in about 4 weeks (around 07/17/2020) for MTX new pt f/u MTX start 4wks.   Collier Salina, MD  Note - This record has been created using [employer].  Chart creation errors have been sought, but may not always  have been located. Such creation errors do not reflect on  the standard of medical care.

## 2020-06-19 NOTE — Patient Instructions (Addendum)
I am checking your labs for safety monitoring of the medication. We will call you after seeing it looks okay then recommend starting the methotrexate 6 tablets by mouth once per week, and taking folic acid 1 mg per day to prevent risk of side effects.  I am also checking vitamin D level to see if this has improved, since rheumatoid arthritis is a risk factor for osteoporosis and bone fractures.   Methotrexate tablets What is this medicine? METHOTREXATE (METH oh TREX ate) is a chemotherapy drug used to treat cancer including breast cancer, leukemia, and lymphoma. This medicine can also be used to treat psoriasis and certain kinds of arthritis. This medicine may be used for other purposes; ask your health care provider or pharmacist if you have questions. COMMON BRAND NAME(S): Rheumatrex, Trexall What should I tell my health care provider before I take this medicine? They need to know if you have any of these conditions:  fluid in the stomach area or lungs  if you often drink alcohol  infection or immune system problems  kidney disease or on hemodialysis  liver disease  low blood counts, like low white cell, platelet, or red cell counts  lung disease  radiation therapy  stomach ulcers  ulcerative colitis  an unusual or allergic reaction to methotrexate, other medicines, foods, dyes, or preservatives  pregnant or trying to get pregnant  breast-feeding How should I use this medicine? Take this medicine by mouth with a glass of water. Follow the directions on the prescription label. Take your medicine at regular intervals. Do not take it more often than directed. Do not stop taking except on your doctor's advice. Make sure you know why you are taking this medicine and how often you should take it. If this medicine is used for a condition that is not cancer, like arthritis or psoriasis, it should be taken weekly, NOT daily. Taking this medicine more often than directed can cause  serious side effects, even death. Talk to your healthcare provider about safe handling and disposal of this medicine. You may need to take special precautions. Talk to your pediatrician regarding the use of this medicine in children. While this drug may be prescribed for selected conditions, precautions do apply. Overdosage: If you think you have taken too much of this medicine contact a poison control center or emergency room at once. NOTE: This medicine is only for you. Do not share this medicine with others. What if I miss a dose? If you miss a dose, talk with your doctor or health care professional. Do not take double or extra doses. What may interact with this medicine? Do not take this medicine with any of the following medications:  acitretin This medicine may also interact with the following medication:  aspirin and aspirin-like medicines including salicylates  azathioprine  certain antibiotics like penicillins, tetracycline, and chloramphenicol  certain medicines that treat or prevent blood clots like warfarin, apixaban, dabigatran, and rivaroxaban  certain medicines for stomach problems like esomeprazole, omeprazole, pantoprazole  cyclosporine  dapsone  diuretics  gold  hydroxychloroquine  live virus vaccines  medicines for infection like acyclovir, adefovir, amphotericin B, bacitracin, cidofovir, foscarnet, ganciclovir, gentamicin, pentamidine, vancomycin  mercaptopurine  NSAIDs, medicines for pain and inflammation, like ibuprofen or naproxen  other cytotoxic agents  pamidronate  pemetrexed  penicillamine  phenylbutazone  phenytoin  probenecid  pyrimethamine  retinoids such as isotretinoin and tretinoin  steroid medicines like prednisone or cortisone  sulfonamides like sulfasalazine and trimethoprim/sulfamethoxazole  theophylline  zoledronic  acid This list may not describe all possible interactions. Give your health care provider a list of  all the medicines, herbs, non-prescription drugs, or dietary supplements you use. Also tell them if you smoke, drink alcohol, or use illegal drugs. Some items may interact with your medicine. What should I watch for while using this medicine? Avoid alcoholic drinks. This medicine can make you more sensitive to the sun. Keep out of the sun. If you cannot avoid being in the sun, wear protective clothing and use sunscreen. Do not use sun lamps or tanning beds/booths. You may need blood work done while you are taking this medicine. Call your doctor or health care professional for advice if you get a fever, chills or sore throat, or other symptoms of a cold or flu. Do not treat yourself. This drug decreases your body's ability to fight infections. Try to avoid being around people who are sick. This medicine may increase your risk to bruise or bleed. Call your doctor or health care professional if you notice any unusual bleeding. Be careful brushing or flossing your teeth or using a toothpick because you may get an infection or bleed more easily. If you have any dental work done, tell your dentist you are receiving this medicine. Check with your doctor or health care professional if you get an attack of severe diarrhea, nausea and vomiting, or if you sweat a lot. The loss of too much body fluid can make it dangerous for you to take this medicine. Talk to your doctor about your risk of cancer. You may be more at risk for certain types of cancers if you take this medicine. Do not become pregnant while taking this medicine or for 6 months after stopping it. Women should inform their health care provider if they wish to become pregnant or think they might be pregnant. Men should not father a child while taking this medicine and for 3 months after stopping it. There is potential for serious harm to an unborn child. Talk to your health care provider for more information. Do not breast-feed an infant while taking this  medicine or for 1 week after stopping it. This medicine may make it more difficult to get pregnant or father a child. Talk to your health care provider if you are concerned about your fertility. What side effects may I notice from receiving this medicine? Side effects that you should report to your doctor or health care professional as soon as possible:  allergic reactions like skin rash, itching or hives, swelling of the face, lips, or tongue  breathing problems or shortness of breath  diarrhea  dry, nonproductive cough  low blood counts - this medicine may decrease the number of white blood cells, red blood cells and platelets. You may be at increased risk for infections and bleeding.  mouth sores  redness, blistering, peeling or loosening of the skin, including inside the mouth  signs of infection - fever or chills, cough, sore throat, pain or trouble passing urine  signs and symptoms of bleeding such as bloody or black, tarry stools; red or dark-brown urine; spitting up blood or brown material that looks like coffee grounds; red spots on the skin; unusual bruising or bleeding from the eye, gums, or nose  signs and symptoms of kidney injury like trouble passing urine or change in the amount of urine  signs and symptoms of liver injury like dark yellow or brown urine; general ill feeling or flu-like symptoms; light-colored stools; loss of appetite;  nausea; right upper belly pain; unusually weak or tired; yellowing of the eyes or skin Side effects that usually do not require medical attention (report to your doctor or health care professional if they continue or are bothersome):  dizziness  hair loss  tiredness  upset stomach  vomiting This list may not describe all possible side effects. Call your doctor for medical advice about side effects. You may report side effects to FDA at 1-800-FDA-1088. Where should I keep my medicine? Keep out of the reach of children and  pets. Store at room temperature between 20 and 25 degrees C (68 and 77 degrees F). Protect from light. Get rid of any unused medicine after the expiration date. Talk to your health care provider about how to dispose of unused medicine. Special directions may apply.    Rheumatoid Arthritis Rheumatoid arthritis (RA) is a long-term (chronic) disease that causes inflammation in your joints. RA may start slowly. It most often affects the small joints of the hands and feet. Usually, the same joints are affected on both sides of your body. Inflammation from RA can also affect other parts of your body, including your heart, eyes, or lungs. There is no cure for RA, but medicines can help your symptoms and halt or slow down the progression of the disease. What are the causes? RA is an autoimmune disease. When you have an autoimmune disease, your body's defense system (immune system) mistakenly attacks healthy body tissues. The exact cause of RA is not known. What increases the risk? You are more likely to develop this condition if you:  Are a woman.  Have a family history of RA or other autoimmune diseases.  Have a history of smoking.  Are obese.  Have been exposed to pollutants or chemicals. What are the signs or symptoms? The first symptom of this condition may be morning stiffness that lasts longer than 30 minutes.  Symptoms usually start gradually. They are often worse in the morning. As RA progresses, symptoms may include:  Pain, stiffness, swelling, warmth, and tenderness in joints on both sides of your body.  Loss of energy.  Loss of appetite.  Weight loss.  Low-grade fever.  Dry eyes and dry mouth.  Firm lumps (rheumatoid nodules) that grow beneath your skin in areas such as your forearm bones near your elbows and on your hands.  Changes in the appearance of joints (deformity) and loss of joint function. Symptoms of this condition vary from person to person.  Symptoms of  RA often come and go.  Sometimes, symptoms get worse for a period of time. These are called flares. How is this diagnosed? This condition is diagnosed based on your symptoms, medical history, and physical exam.  You may have X-rays or an MRI to check for the type of joint changes that are caused by RA. You may also have blood tests to look for:  Proteins (antibodies) that your immune system may make if you have RA. These include rheumatoid factor (RF) and anti-CCP. ? When blood tests show these proteins, you are said to have "seropositive RA." ? When blood tests do not show these proteins, you may have "seronegative RA."  Inflammation in your blood.  A low number of red blood cells (anemia). How is this treated? The goals of treatment are to relieve pain, reduce inflammation, and slow down or stop joint damage and disability. Treatment may include:  Lifestyle changes. It is important to rest as needed, eat a healthy diet, and exercise.  Medicines. Your health care provider may adjust your medicines every 3 months until treatment goals are reached. Common medicines include: ? Pain relievers (analgesics). ? Corticosteroids and NSAIDs to reduce inflammation. ? Disease-modifying antirheumatic drugs (DMARDs) to try to slow the course of the disease. ? Biologic response modifiers to reduce inflammation and damage.  Physical therapy and occupational therapy.  Surgery, if you have severe joint damage. Joint replacement or fusing of joints may be needed. Your health care provider will work with you to identify the best treatment option for you based on assessment of the overall disease activity in your body.   Follow these instructions at home: Activity  Return to your normal activities as told by your health care provider. Ask your health care provider what activities are safe for you.  Rest when you are having a flare.  Start an exercise program as told by your health care  provider. General instructions  Keep all follow-up visits as told by your health care provider. This is important.  Take over-the-counter and prescription medicines only as told by your health care provider. Where to find more information  [employer]: www.rheumatology.org  Arthritis Foundation: www.arthritis.org Contact a health care provider if:  You have a flare-up of RA symptoms.  You have a fever.  You have side effects from your medicines. Get help right away if:  You have chest pain.  You have trouble breathing.  You quickly develop a hot, painful joint that is more severe than your usual joint aches. Summary  Rheumatoid arthritis (RA) is a long-term (chronic) disease that causes inflammation in your joints.  RA is an autoimmune disease.  The goals of treatment are to relieve pain, reduce inflammation, and slow down or stop joint damage and disability. This information is not intended to replace advice given to you by your health care provider. Make sure you discuss any questions you have with your health care provider. Document Revised: 10/02/2018 Document Reviewed: 11/21/2017 Elsevier Patient Education  2021 [employer].

## 2020-06-20 LAB — COMPLETE METABOLIC PANEL WITH GFR
AG Ratio: 0.7 (calc) — ABNORMAL LOW (ref 1.0–2.5)
Globulin: 4.7 g/dL (calc) — ABNORMAL HIGH (ref 1.9–3.7)
Total Protein: 8 g/dL (ref 6.1–8.1)

## 2020-06-20 LAB — CBC WITH DIFFERENTIAL/PLATELET
MCV: 84.8 fL (ref 80.0–100.0)
Neutro Abs: 3883 cells/uL (ref 1500–7800)

## 2020-06-20 LAB — VITAMIN D 25 HYDROXY (VIT D DEFICIENCY, FRACTURES): Vit D, 25-Hydroxy: 52 ng/mL (ref 30–100)

## 2020-06-21 LAB — COMPLETE METABOLIC PANEL WITH GFR
ALT: 15 U/L (ref 6–29)
Calcium: 8.6 mg/dL (ref 8.6–10.4)
Creat: 0.51 mg/dL (ref 0.50–1.05)
Glucose, Bld: 102 mg/dL — ABNORMAL HIGH (ref 65–99)

## 2020-06-21 LAB — CBC WITH DIFFERENTIAL/PLATELET
Basophils Relative: 0.6 %
Eosinophils Relative: 1.5 %
Hemoglobin: 9.9 g/dL — ABNORMAL LOW (ref 11.7–15.5)

## 2020-06-21 LAB — QUANTIFERON-TB GOLD PLUS: Mitogen-NIL: 1.55 IU/mL

## 2020-06-22 LAB — CBC WITH DIFFERENTIAL/PLATELET
Absolute Monocytes: 248 cells/uL (ref 200–950)
Basophils Absolute: 32 cells/uL (ref 0–200)
Eosinophils Absolute: 81 cells/uL (ref 15–500)
HCT: 30.8 % — ABNORMAL LOW (ref 35.0–45.0)
MCH: 27.3 pg (ref 27.0–33.0)
MCHC: 32.1 g/dL (ref 32.0–36.0)
Monocytes Relative: 4.6 %
Neutrophils Relative %: 71.9 %
Platelets: 415 10*3/uL — ABNORMAL HIGH (ref 140–400)
RBC: 3.63 10*6/uL — ABNORMAL LOW (ref 3.80–5.10)
RDW: 14.7 % (ref 11.0–15.0)
Total Lymphocyte: 21.4 %
WBC: 5.4 10*3/uL (ref 3.8–10.8)

## 2020-06-22 LAB — COMPLETE METABOLIC PANEL WITH GFR
AST: 30 U/L (ref 10–35)
Albumin: 3.3 g/dL — ABNORMAL LOW (ref 3.6–5.1)
Alkaline phosphatase (APISO): 74 U/L (ref 37–153)
BUN: 8 mg/dL (ref 7–25)
CO2: 24 mmol/L (ref 20–32)
Chloride: 103 mmol/L (ref 98–110)
GFR, Est African American: 126 mL/min/{1.73_m2} (ref >=60)
GFR, Est Non African American: 109 mL/min/{1.73_m2} (ref >=60)
Potassium: 4.5 mmol/L (ref 3.5–5.3)
Sodium: 137 mmol/L (ref 135–146)
Total Bilirubin: 0.4 mg/dL (ref 0.2–1.2)

## 2020-06-22 LAB — QUANTIFERON-TB GOLD PLUS
NIL: 0.01 IU/mL
QuantiFERON-TB Gold Plus: NEGATIVE
TB1-NIL: 0 IU/mL
TB2-NIL: 0.02 IU/mL

## 2020-06-22 LAB — HEPATITIS PANEL, ACUTE
Hep A IgM: NONREACTIVE
Hep B C IgM: NONREACTIVE
Hepatitis B Surface Ag: REACTIVE — AB
Hepatitis C Ab: NONREACTIVE
SIGNAL TO CUT-OFF: 0.1 (ref <1.00)

## 2020-06-26 ENCOUNTER — Telehealth: Payer: Self-pay | Admitting: Internal Medicine

## 2020-06-26 MED ORDER — FOLIC ACID 1 MG PO TABS: 1.0000 mg | ORAL_TABLET | Freq: Every day | ORAL | 0 refills | Status: DC

## 2020-06-26 MED ORDER — METHOTREXATE 2.5 MG PO TABS: 15.0000 mg | ORAL_TABLET | ORAL | 0 refills | Status: DC

## 2020-06-26 NOTE — Telephone Encounter (Signed)
Spoke with patient, advised labs look okay to begin medication, Rx sent to pharmacy today. Her hemoglobin is slightly low at 9.9 but this is not a problem for safely using the medication. We will be rechecking at follow up.

## 2020-06-26 NOTE — Addendum Note (Signed)
Addended by: Fuller Plan on: 06/26/2020 12:24 PM   Modules accepted: Orders

## 2020-06-26 NOTE — Telephone Encounter (Signed)
Labs look okay to begin medication, Rx sent to pharmacy today. Her hemoglobin is slightly low at 9.9 but this is not a problem for safely using the medication. We will be rechecking at follow up. My fault for additional delay.

## 2020-06-26 NOTE — Telephone Encounter (Signed)
Appears, per office note on 06/19/2020, patient was supposed to start MTX 15 mg PO weekly and folic acid 1 mg PO daily. I do not see where medication has been sent to pharmacy? Please advise.

## 2020-06-26 NOTE — Telephone Encounter (Signed)
Patient's husband calling back in reference to new medication for patient, stating he has not heard anything. Please call to advise.

## 2020-07-19 NOTE — Progress Notes (Deleted)
Office Visit Note  Patient: [name]             Date of Birth: 1965/10/31           MRN: [id number]             PCP: Tracey Harries, MD Referring: Tracey Harries, MD Visit Date: 07/20/2020   Subjective:  No chief complaint on file.   History of Present Illness: Kennethia Lynes is a 55 y.o. female here for follow up for seropositive RA after starting methotrexate 15 mg PO weekly.***     No Rheumatology ROS completed.   PMFS History:  Patient Active Problem List   Diagnosis Date Noted  . 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/19/2020  . Long-term use of high-risk medication 06/19/2020  . Vitamin D deficiency 06/19/2020  . Anogenital (venereal) warts 08/04/2017  . Screening for colorectal cancer 08/04/2017  . Encounter for gynecological examination with Papanicolaou smear of cervix 08/04/2017  . Vertigo 08/04/2017  . Tobacco abuse 06/01/2017  . Hypothyroidism 05/12/2017  . Acute CVA (cerebrovascular accident) (HCC) 06/25/2013  . Tremor of left hand 06/25/2013  . Hemiparesis, left (HCC) 06/25/2013  . Hyperlipidemia LDL goal <70   . Difficulty in walking(719.7) 08/31/2011  . Unstable balance 08/31/2011  . Weakness of left side of body 08/31/2011  . Cerebral infarction (HCC) 04/07/2011  . Carotid artery occlusion 04/07/2011  . Left hand weakness 04/07/2011  . HTN (hypertension) 04/07/2011    Past Medical History:  Diagnosis Date  . High cholesterol   . Hypertension   . Hypothyroidism   . Stroke [facility])    weakness on left side.  . Vaginal Pap smear, abnormal     Family History  Problem Relation Age of Onset  . Hypertension Mother   . Liver disease Mother   . Heart attack Father   . HIV Sister   . Arrhythmia Brother   . CAD Brother   . Arrhythmia Sister   . Mood Disorder Sister    Past Surgical History:  Procedure Laterality Date  . DILATION AND CURETTAGE OF UTERUS    . LASER ABLATION CONDOLAMATA Bilateral 09/27/2017   Procedure: LASER ABLATION  CONDYLOMA AND REMOVAL OF PERIANAL WART;  Surgeon: [name], MD;  Location: [facility];  Service: Gynecology;  Laterality: Bilateral;  . MASS EXCISION Left 09/27/2017   Procedure: EXCISION LESION LEFT THIGH;  Surgeon: [name], MD;  Location: [facility];  Service: Gynecology;  Laterality: Left;   Social History   Social History Narrative   Grew up in [place], [place].   Engaged to be married.   5 children.    Eats mainly meat/junk food.   Immunization History  Administered Date(s) Administered  . PFIZER(Purple Top)SARS-COV-2 Vaccination 12/12/2019, 01/02/2020     Objective: Vital Signs: LMP 09/11/2012    Physical Exam   Musculoskeletal Exam: ***  CDAI Exam: CDAI Score: -- Patient Global: --; Provider Global: -- Swollen: --; Tender: -- Joint Exam 07/20/2020   No joint exam has been documented for this visit   There is currently no information documented on the homunculus. Go to the Rheumatology activity and complete the homunculus joint exam.  Investigation: No additional findings.  Imaging: No results found.  Recent Labs: Lab Results  Component Value Date   WBC 5.4 06/19/2020   HGB 9.9 (L) 06/19/2020   PLT 415 (H) 06/19/2020   NA 137 06/19/2020   K 4.5 06/19/2020   CL 103 06/19/2020   CO2 24 06/19/2020  GLUCOSE 102 (H) 06/19/2020   BUN 8 06/19/2020   CREATININE 0.51 06/19/2020   BILITOT 0.4 06/19/2020   ALKPHOS 87 09/26/2017   AST 30 06/19/2020   ALT 15 06/19/2020   PROT 8.0 06/19/2020   ALBUMIN 3.5 09/26/2017   CALCIUM 8.6 06/19/2020   GFRAA 126 06/19/2020   QFTBGOLDPLUS NEGATIVE 06/19/2020    Speciality Comments: No specialty comments available.  Procedures:  No procedures performed Allergies: Lisinopril   Assessment / Plan:     Visit Diagnoses: No diagnosis found.  ***  Orders: No orders of the defined types were placed in this encounter.  No orders of the defined types were placed in this encounter.    Follow-Up Instructions: No  follow-ups on file.   Fuller Plan, MD  Note - This record has been created using [employer].  Chart creation errors have been sought, but may not always  have been located. Such creation errors do not reflect on  the standard of medical care.

## 2020-07-20 ENCOUNTER — Ambulatory Visit: Payer: Medicaid Other | Admitting: Internal Medicine

## 2020-07-27 ENCOUNTER — Other Ambulatory Visit: Payer: Self-pay | Admitting: Radiology

## 2020-07-27 DIAGNOSIS — M059 Rheumatoid arthritis with rheumatoid factor, unspecified: Secondary | ICD-10-CM

## 2020-07-27 NOTE — Telephone Encounter (Signed)
Spoke with patient's daughter, she has been re-scheduled for an appointment on Wednesday 07/29/2020. They will call to reschedule if need be.

## 2020-07-27 NOTE — Telephone Encounter (Signed)
Received clarification from [facility]- "NEED RX W/ UPDATED REFILLS IF APPROPRIATE. PLEASE ADVISE. THANKS!"  Next Visit: Patient no showed appointment on 07/20/2020  Last Visit: 06/19/2020  Last Fill: 06/26/2020  DX: Seropositive rheumatoid arthritis  Current Dose per office note 06/19/2020: 15 mg PO weekly  Labs: CMP, CBC 06/19/2020  Okay to refill MTX?

## 2020-07-28 NOTE — Progress Notes (Signed)
Office Visit Note  Patient: [name]             Date of Birth: 12-Aug-1965           MRN: [id number]             PCP: Tracey Harries, MD Referring: Tracey Harries, MD Visit Date: 07/29/2020   Subjective:  Follow-up (Patient feels as if symptoms are well controlled on MTX. )   History of Present Illness: [name] is a 55 y.o. female here for follow up for seropositive rheumatoid arthritis about a month after starting methotrexate 15 mg p.o. weekly.  She feels like her symptoms are improved since starting the medication none are complaining that her bones ache.  She does still have swelling in her extremities on both sides.  She denies any nausea vomiting diarrhea or other problems with the medicine.    Review of Systems  Constitutional: Negative for fatigue.  HENT: Negative for mouth sores, mouth dryness and nose dryness.   Eyes: Negative for pain, itching, visual disturbance and dryness.  Respiratory: Negative for cough, hemoptysis, shortness of breath and difficulty breathing.   Cardiovascular: Negative for chest pain, palpitations and swelling in legs/feet.  Gastrointestinal: Negative for abdominal pain, blood in stool, constipation and diarrhea.  Endocrine: Negative for increased urination.  Genitourinary: Negative for painful urination.  Musculoskeletal: Positive for joint swelling and morning stiffness. Negative for arthralgias, joint pain, myalgias, muscle weakness, muscle tenderness and myalgias.  Skin: Negative for color change, rash and redness.  Allergic/Immunologic: Negative for susceptible to infections.  Neurological: Positive for memory loss. Negative for dizziness, numbness, headaches and weakness.  Hematological: Negative for swollen glands.  Psychiatric/Behavioral: Negative for confusion and sleep disturbance.    PMFS History:  Patient Active Problem List   Diagnosis Date Noted  . 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/19/2020  . Long-term use of  high-risk medication 06/19/2020  . Vitamin D deficiency 06/19/2020  . Anogenital (venereal) warts 08/04/2017  . Screening for colorectal cancer 08/04/2017  . Encounter for gynecological examination with Papanicolaou smear of cervix 08/04/2017  . Vertigo 08/04/2017  . Tobacco abuse 06/01/2017  . Hypothyroidism 05/12/2017  . Acute CVA (cerebrovascular accident) (HCC) 06/25/2013  . Tremor of left hand 06/25/2013  . Hemiparesis, left (HCC) 06/25/2013  . Hyperlipidemia LDL goal <70   . Difficulty in walking(719.7) 08/31/2011  . Unstable balance 08/31/2011  . Weakness of left side of body 08/31/2011  . Cerebral infarction (HCC) 04/07/2011  . Carotid artery occlusion 04/07/2011  . Left hand weakness 04/07/2011  . HTN (hypertension) 04/07/2011    Past Medical History:  Diagnosis Date  . High cholesterol   . Hypertension   . Hypothyroidism   . Stroke [facility])    weakness on left side.  . Vaginal Pap smear, abnormal     Family History  Problem Relation Age of Onset  . Hypertension Mother   . Liver disease Mother   . Heart attack Father   . HIV Sister   . Arrhythmia Brother   . CAD Brother   . Arrhythmia Sister   . Mood Disorder Sister    Past Surgical History:  Procedure Laterality Date  . DILATION AND CURETTAGE OF UTERUS    . LASER ABLATION CONDOLAMATA Bilateral 09/27/2017   Procedure: LASER ABLATION CONDYLOMA AND REMOVAL OF PERIANAL WART;  Surgeon: [name], MD;  Location: [facility];  Service: Gynecology;  Laterality: Bilateral;  . MASS EXCISION Left 09/27/2017   Procedure: EXCISION LESION LEFT THIGH;  Surgeon: [name], MD;  Location: [facility];  Service: Gynecology;  Laterality: Left;   Social History   Social History Narrative   Grew up in [place], [place].   Engaged to be married.   5 children.    Eats mainly meat/junk food.   Immunization History  Administered Date(s) Administered  . PFIZER(Purple Top)SARS-COV-2 Vaccination 12/12/2019, 01/02/2020      Objective: Vital Signs: BP 129/83 (BP Location: Right Arm, Patient Position: Sitting, Cuff Size: Normal)   Pulse 94   Ht 5\' 6"  (1.676 m)   Wt 280 lb (127 kg)   LMP 09/11/2012   BMI 45.19 kg/m    Physical Exam Constitutional:      Appearance: She is obese.  Skin:    General: Skin is warm and dry.     Findings: No rash.  Neurological:     Mental Status: She is alert.     Comments: Left upper extremity weakness  Psychiatric:        Mood and Affect: Mood normal.      Musculoskeletal Exam:  Swelling without significant tenderness in the second and third MCP joints on right hand, right knee mildly tender swollen slightly reduced range of motion There is overlying swelling and soft tissue of the left hand and wrist without any palpable synovitis or tenderness  CDAI Exam: CDAI Score: 9  Patient Global: 20 mm; Provider Global: 30 mm Swollen: 3 ; Tender: 1  Joint Exam 07/29/2020      Right  Left  MCP 2  Swollen      MCP 3  Swollen      Knee  Swollen Tender        Investigation: No additional findings.  Imaging: No results found.  Recent Labs: Lab Results  Component Value Date   WBC 5.4 06/19/2020   HGB 9.9 (L) 06/19/2020   PLT 415 (H) 06/19/2020   NA 137 06/19/2020   K 4.5 06/19/2020   CL 103 06/19/2020   CO2 24 06/19/2020   GLUCOSE 102 (H) 06/19/2020   BUN 8 06/19/2020   CREATININE 0.51 06/19/2020   BILITOT 0.4 06/19/2020   ALKPHOS 87 09/26/2017   AST 30 06/19/2020   ALT 15 06/19/2020   PROT 8.0 06/19/2020   ALBUMIN 3.5 09/26/2017   CALCIUM 8.6 06/19/2020   GFRAA 126 06/19/2020   QFTBGOLDPLUS NEGATIVE 06/19/2020    Speciality Comments: No specialty comments available.  Procedures:  No procedures performed Allergies: Lisinopril   Assessment / Plan:     Visit Diagnoses: Seropositive rheumatoid arthritis (HCC) - Plan: Sedimentation rate  She reports a large subjective improvement in joint pains although there is still synovitis present on  physical exam today.  Too early to decide clinical efficacy of regimen today.  Continue methotrexate 15 mg p.o. weekly and folic acid 1 mg p.o. daily.  Check sedimentation rate for any correlation with the symptom improvement.  Long-term use of high-risk medication - Plan: CBC with Differential/Platelet, COMPLETE METABOLIC PANEL WITH GFR  Methotrexate has risk of toxicity with cytopenia and liver function changes we will check CBC and CMP today for monitoring.  Orders: Orders Placed This Encounter  Procedures  . CBC with Differential/Platelet  . COMPLETE METABOLIC PANEL WITH GFR  . Sedimentation rate   No orders of the defined types were placed in this encounter.    Follow-Up Instructions: Return in about 2 months (around 09/28/2020) for RA on MTX f/u .   09/30/2020, MD  Note - This  record has been created using Dragon software.  Chart creation errors have been sought, but may not always  have been located. Such creation errors do not reflect on  the standard of medical care.  

## 2020-07-29 ENCOUNTER — Other Ambulatory Visit: Payer: Self-pay

## 2020-07-29 ENCOUNTER — Ambulatory Visit: Payer: Medicaid Other | Admitting: Internal Medicine

## 2020-07-29 ENCOUNTER — Encounter: Payer: Self-pay | Admitting: Internal Medicine

## 2020-07-29 VITALS — BP 129/83 | HR 94 | Ht 66.0 in | Wt 280.0 lb

## 2020-07-29 DIAGNOSIS — Z79899 Other long term (current) drug therapy: Secondary | ICD-10-CM | POA: Diagnosis not present

## 2020-07-29 DIAGNOSIS — M059 Rheumatoid arthritis with rheumatoid factor, unspecified: Secondary | ICD-10-CM

## 2020-07-29 LAB — CBC WITH DIFFERENTIAL/PLATELET
Basophils Absolute: 20 cells/uL (ref 0–200)
MCHC: 33 g/dL (ref 32.0–36.0)
Monocytes Relative: 3.7 %

## 2020-07-29 NOTE — Patient Instructions (Signed)
I am checking lab test to make sure there is no change in blood count and liver function tests related to starting the methotrexate.  If it looks okay we will continue the medicine plan to follow-up in about another 2 months to see how much improvement were going to get with the joint pain and swelling.

## 2020-07-30 LAB — COMPLETE METABOLIC PANEL WITH GFR
AG Ratio: 0.8 (calc) — ABNORMAL LOW (ref 1.0–2.5)
ALT: 22 U/L (ref 6–29)
AST: 33 U/L (ref 10–35)
Albumin: 3.3 g/dL — ABNORMAL LOW (ref 3.6–5.1)
Alkaline phosphatase (APISO): 97 U/L (ref 37–153)
BUN: 10 mg/dL (ref 7–25)
CO2: 25 mmol/L (ref 20–32)
Calcium: 8.9 mg/dL (ref 8.6–10.4)
Chloride: 108 mmol/L (ref 98–110)
Creat: 0.67 mg/dL (ref 0.50–1.05)
GFR, Est African American: 115 mL/min/{1.73_m2} (ref >=60)
GFR, Est Non African American: 100 mL/min/{1.73_m2} (ref >=60)
Globulin: 4.2 g/dL (calc) — ABNORMAL HIGH (ref 1.9–3.7)
Glucose, Bld: 97 mg/dL (ref 65–99)
Potassium: 3.7 mmol/L (ref 3.5–5.3)
Sodium: 141 mmol/L (ref 135–146)
Total Bilirubin: 0.3 mg/dL (ref 0.2–1.2)
Total Protein: 7.5 g/dL (ref 6.1–8.1)

## 2020-07-30 LAB — CBC WITH DIFFERENTIAL/PLATELET
Absolute Monocytes: 181 cells/uL — ABNORMAL LOW (ref 200–950)
Basophils Relative: 0.4 %
Eosinophils Absolute: 59 cells/uL (ref 15–500)
Eosinophils Relative: 1.2 %
HCT: 34.8 % — ABNORMAL LOW (ref 35.0–45.0)
Hemoglobin: 11.5 g/dL — ABNORMAL LOW (ref 11.7–15.5)
Lymphs Abs: 1147 cells/uL (ref 850–3900)
MCH: 27.8 pg (ref 27.0–33.0)
MCV: 84.3 fL (ref 80.0–100.0)
MPV: 9.9 fL (ref 7.5–12.5)
Neutro Abs: 3494 cells/uL (ref 1500–7800)
Neutrophils Relative %: 71.3 %
Platelets: 407 10*3/uL — ABNORMAL HIGH (ref 140–400)
RBC: 4.13 10*6/uL (ref 3.80–5.10)
RDW: 16.7 % — ABNORMAL HIGH (ref 11.0–15.0)
Total Lymphocyte: 23.4 %
WBC: 4.9 10*3/uL (ref 3.8–10.8)

## 2020-07-30 LAB — SEDIMENTATION RATE: Sed Rate: 101 mm/h — ABNORMAL HIGH (ref 0–30)

## 2020-07-30 MED ORDER — METHOTREXATE 2.5 MG PO TABS: 15.0000 mg | ORAL_TABLET | ORAL | 0 refills | Status: DC

## 2020-07-30 NOTE — Progress Notes (Signed)
Blood test results look fine for continuing the methotrexate as planned, new Rx is sent to pharmacy. Her sedimentation rate test is still elevated which is not surprising with the amount of swelling seen at our visit.

## 2020-07-30 NOTE — Addendum Note (Signed)
Addended by: Fuller Plan on: 07/30/2020 08:33 AM   Modules accepted: Orders

## 2020-08-07 ENCOUNTER — Telehealth: Payer: Self-pay | Admitting: Internal Medicine

## 2020-08-07 MED ORDER — PREDNISONE 10 MG PO TABS: 10.0000 mg | ORAL_TABLET | Freq: Every day | ORAL | 0 refills | Status: DC

## 2020-08-07 NOTE — Telephone Encounter (Signed)
Patients daughter calling. Patient having break thru pain on days like today. Pain in knees / joints. Patient would like to know if there is anything that can be called in for her to help. Patient uses [facility] in [place]. Please call to advise.

## 2020-08-07 NOTE — Telephone Encounter (Signed)
I called patient's daughter [name] to relay message, patient's daughter wants to know what health problems you are referring to, please sent RX for prednisone to [facility] in [place]. Thank you.

## 2020-08-07 NOTE — Telephone Encounter (Signed)
We have not previously discussed treatment with opioid pain medication for chronic pain here. This is not a recommended medication for RA treatment, if she has a lot of chronic pain from other existing problems she can ask her PCP or I could refer to pain management.

## 2020-08-07 NOTE — Telephone Encounter (Signed)
Rx is sent now. [name] has a history of stroke with residual partial paralysis and a history of hypertension. Prednisone can increase blood pressure and cause weight gain with long term use that would lead to more risk for the heart and circulation, and that is why these are relevant.

## 2020-08-07 NOTE — Telephone Encounter (Signed)
Patient's daughter wants to know what patient can take for pain, patient's daughter advised we do not prescribe pain medication, PCP had prescribed Tramadol until patient could be seen by rheumatology, patient's daughter will call PCP to refill Tramadol, please advise.

## 2020-08-07 NOTE — Telephone Encounter (Signed)
I recommend she could try taking prednisone to relieve joint pain in the short term. Currently she is only recently on the methotrexate so it is probably not helping to a maximum degree. Would like to avoid high doses of medicine due to many health problems but should be okay if just for a few days at a time. If she agrees can sent Rx for prednisone 10 mg daily.

## 2020-08-10 ENCOUNTER — Telehealth: Payer: Self-pay | Admitting: *Deleted

## 2020-08-10 NOTE — Telephone Encounter (Signed)
Patient's daughter, [name], called and demanded to speak to the supervisor to complain that Aundra Millet, Dr. Gregary Cromer assistant, hung up on her. Aundra Millet had previously called [name] to offer a referral to pain management for her mother's pain. [name] began yelling at [facility] through the phone and hung up on Megan as Aundra Millet was trying to explain Dr. Gregary Cromer message. Megan asked twice before hanging up the phone "Are you there?" When the patient called back, the patient began to complain that it took too long for Adrienne, front desk staff, to find Jessica's name on her HIPAA. The patient stated she was going to file a complaint and report our office. [name] stated that Aundra Millet did not answer her questions and hung up on her. I asked the patient how I could help and what questions she would like addressed with Dr. Dimple Casey. Her question is "Is there any other medication that can be prescribed for pain other than narcotics." The question has been sent to Dr. Dimple Casey. [name] was angry because no one told her that [facility] does not prescribe narcotics for pain management whenever she made her mother's new patient appointment. I explained to the patient that the referral was placed for RA and arthritis. A pain management referral was offered and [name] declined. [name] stated in a conversation on 08/07/2020 that her mother's PCP had given the patient pain medication until she could be seen by rheumatology and that our office wasted their time. [name] stated on 08/07/2020, that she was going to reach out to the PCP for additional pain medication.  The patient began to complain the person she spoke with on 08/07/2020 took took long to find Jessica's name on the HIPAA and did not address her questions. I explained to [name] that she spoke to me and that I have to verify who is on the HIPAA before I can release information and offered to help. [name] began to swear using "f___". [name] yelled and stated she was going to report  our office to [facility] and proceeded to hang up on me. Dr. Dimple Casey witnessed both conversations.

## 2020-08-10 NOTE — Telephone Encounter (Signed)
Per patient's daughter, [name], pain is not due to other medical conditions, it is due to RA. Does not need pain management referral- PCP has taken care of this. Chronic pain is due to RA. Also, prior to coming to Dr. Gregary Cromer a patient referred here from another office she was warned that Dr. Gregary Cromer office was "like this" from the prior patient. What other medical conditions are causing the pain you're referring to?

## 2020-08-10 NOTE — Telephone Encounter (Signed)
During phone conversation patient's daughter was very verbally accusatory and hateful- patient's daughter was read word for word Dr. Gregary Cromer response and not happy with what was being said. Patient's daughter accused our staff of incorrectly documenting phone conversations. Patient's daughter then hung up on me.

## 2020-08-10 NOTE — Telephone Encounter (Signed)
Patient's daughter, [name], wants to know if  there any other medication that can be prescribed for pain other than narcotics? Please advise.

## 2020-08-11 NOTE — Telephone Encounter (Signed)
FYI- I spoke with Ms. [name] recommending that the prednisone medication should be beneficial for joint pain related to the rheumatoid arthritis, which appeared active at her last clinic visit based on joint swelling and stiffness and elevated inflammatory markers on blood tests. She agrees with her mother starting this medication and I explained symptoms often improve within about 48-72 hours on steroid medication for inflammation, if not getting any better we can follow up again. She had a lot of frustration with phone conversation with clinic staff Friday and Monday she reports that there were repeated calls taking several minutes to verify discussion with her HIPPA compliance on behalf of her mother. She also feels her questions were not answered well. She reports that she never requested opioid pain medication, but that prednisone as a treatment for RA related joint pain was not explained well which led to confusion in subsequent conversations. She states that she did not hang up the phone on clinic staff yesterday and believes that is documented inaccurately.

## 2020-08-20 ENCOUNTER — Encounter: Payer: Self-pay | Admitting: Physician Assistant

## 2020-08-26 DIAGNOSIS — D84821 Immunodeficiency due to drugs: Secondary | ICD-10-CM | POA: Insufficient documentation

## 2020-08-26 DIAGNOSIS — Z79899 Other long term (current) drug therapy: Secondary | ICD-10-CM | POA: Insufficient documentation

## 2020-09-14 ENCOUNTER — Ambulatory Visit: Payer: Medicaid Other | Admitting: Physician Assistant

## 2020-09-29 NOTE — Progress Notes (Deleted)
Office Visit Note  Patient: [name]             Date of Birth: 29-Aug-1965           MRN: [id number]             PCP: Bernerd Limbo, MD Referring: Bernerd Limbo, MD Visit Date: 09/30/2020   Subjective:  No chief complaint on file.   History of Present Illness: [name] is a 55 y.o. female here for follow up for seropositive rheumatoid arthritis on methotrexate 15 mg PO weekly and folic acid 1 mg daily. She also suffered continued joint inflammation and was recommended starting prednisone 10 mg daily for short term management. ***     No Rheumatology ROS completed.   Previous HPI: 07/29/20 [name] is a 55 y.o. female here for follow up for seropositive rheumatoid arthritis about a month after starting methotrexate 15 mg p.o. weekly.  She feels like her symptoms are improved since starting the medication none are complaining that her bones ache.  She does still have swelling in her extremities on both sides.  She denies any nausea vomiting diarrhea or other problems with the medicine.  06/19/20 [name] is a 55 y.o. female with history of hypothyroidism, CVA with residual left hemiparesis, HTN, HLD here for evaluation of arthritis of multiple joints and positive rheumatoid factor. She has symptoms for some amount of time at least 6 months, swelling, and stiffness of joints on the right half of her body including elbow, wrist, fingers, and knee. She was evaluated in PCP clinic with highly positive inflammatory markers and RA antibodies with swollen joints noted on exam in 11/2019. She also had positive ANA and RNP antibodies. Xray of the right hand was checked and reportedly not show other cause of joint pain and no erosion. She has used some topical diclofenac without much improvement and tried tramadol also without much improvement. She has become unable to walk much due to severe pain bearing weight on her right knee and the left leg is too weak. Today she says symptoms are  doing better compared to 2 months ago severity. She denies any complaints of hair loss, rashes, lymphadenopathy, oral ulcers, raynaud's.    Labs reviewed 11/2019 ESR 114 CRP 36 RF 421.6 CCP >250 ANA pos RNP >8.0 DsDNA, Sm, SSA, SSB neg Vit D 12.7 TSH 4.850  PMFS History:  Patient Active Problem List   Diagnosis Date Noted   Seropositive rheumatoid arthritis ([place]) 06/19/2020   Long-term use of high-risk medication 06/19/2020   Vitamin D deficiency 06/19/2020   Anogenital (venereal) warts 08/04/2017   Screening for colorectal cancer 08/04/2017   Encounter for gynecological examination with Papanicolaou smear of cervix 08/04/2017   Vertigo 08/04/2017   Tobacco abuse 06/01/2017   Hypothyroidism 05/12/2017   Acute CVA (cerebrovascular accident) ([place]) 06/25/2013   Tremor of left hand 06/25/2013   Hemiparesis, left ([place]) 06/25/2013   Hyperlipidemia LDL goal <70    Difficulty in walking(719.7) 08/31/2011   Unstable balance 08/31/2011   Weakness of left side of body 08/31/2011   Cerebral infarction ([place]) 04/07/2011   Carotid artery occlusion 04/07/2011   Left hand weakness 04/07/2011   HTN (hypertension) 04/07/2011    Past Medical History:  Diagnosis Date   High cholesterol    Hypertension    Hypothyroidism    Stroke ([place])    weakness on left side.   Vaginal Pap smear, abnormal     Family History  Problem Relation Age of  Onset   Hypertension Mother    Liver disease Mother    Heart attack Father    HIV Sister    Arrhythmia Brother    CAD Brother    Arrhythmia Sister    Mood Disorder Sister    Past Surgical History:  Procedure Laterality Date   DILATION AND CURETTAGE OF UTERUS     LASER ABLATION CONDOLAMATA Bilateral 09/27/2017   Procedure: LASER ABLATION CONDYLOMA AND REMOVAL OF PERIANAL WART;  Surgeon: Florian Buff, MD;  Location: [facility];  Service: Gynecology;  Laterality: Bilateral;   MASS EXCISION Left 09/27/2017   Procedure: EXCISION LESION LEFT THIGH;   Surgeon: Florian Buff, MD;  Location: [facility];  Service: Gynecology;  Laterality: Left;   Social History   Social History Narrative   Grew up in [place], [place].   Engaged to be married.   5 children.    Eats mainly meat/junk food.   Immunization History  Administered Date(s) Administered   PFIZER Comirnaty(Gray Top)Covid-19 Tri-Sucrose Vaccine 08/26/2020   PFIZER(Purple Top)SARS-COV-2 Vaccination 12/12/2019, 01/02/2020     Objective: Vital Signs: LMP 09/11/2012    Physical Exam   Musculoskeletal Exam: ***  CDAI Exam: CDAI Score: -- Patient Global: --; Provider Global: -- Swollen: --; Tender: -- Joint Exam 09/30/2020   No joint exam has been documented for this visit   There is currently no information documented on the homunculus. Go to the Rheumatology activity and complete the homunculus joint exam.  Investigation: No additional findings.  Imaging: No results found.  Recent Labs: Lab Results  Component Value Date   WBC 4.9 07/29/2020   HGB 11.5 (L) 07/29/2020   PLT 407 (H) 07/29/2020   NA 141 07/29/2020   K 3.7 07/29/2020   CL 108 07/29/2020   CO2 25 07/29/2020   GLUCOSE 97 07/29/2020   BUN 10 07/29/2020   CREATININE 0.67 07/29/2020   BILITOT 0.3 07/29/2020   ALKPHOS 87 09/26/2017   AST 33 07/29/2020   ALT 22 07/29/2020   PROT 7.5 07/29/2020   ALBUMIN 3.5 09/26/2017   CALCIUM 8.9 07/29/2020   GFRAA 115 07/29/2020   QFTBGOLDPLUS NEGATIVE 06/19/2020    Speciality Comments: No specialty comments available.  Procedures:  No procedures performed Allergies: Lisinopril   Assessment / Plan:     Visit Diagnoses: No diagnosis found.  ***  Orders: No orders of the defined types were placed in this encounter.  No orders of the defined types were placed in this encounter.    Follow-Up Instructions: No follow-ups on file.   Collier Salina, MD  Note - This record has been created using [employer].  Chart creation errors have been  sought, but may not always  have been located. Such creation errors do not reflect on  the standard of medical care.

## 2020-09-30 ENCOUNTER — Ambulatory Visit: Payer: Medicaid Other | Admitting: Internal Medicine

## 2020-10-07 ENCOUNTER — Ambulatory Visit: Payer: Medicaid Other | Admitting: Internal Medicine

## 2020-10-07 NOTE — Progress Notes (Deleted)
 Office Visit Note  Patient: [name]             Date of Birth: 08/31/1965           MRN: [id number]             PCP: Bouska, David, MD Referring: Bouska, David, MD Visit Date: 10/07/2020   Subjective:  No chief complaint on file.   History of Present Illness: [name] is a 55 y.o. female here for follow up for seropositive RA on methotrexate 15 mg PO weekly since about 3 months ago. She was recommended to take prednisone for a short duration after her daughter called to report continued joint pains.***   Previous HPI: 07/29/20 [name] is a 55 y.o. female here for follow up for seropositive rheumatoid arthritis about a month after starting methotrexate 15 mg p.o. weekly.  She feels like her symptoms are improved since starting the medication none are complaining that her bones ache.  She does still have swelling in her extremities on both sides.  She denies any nausea vomiting diarrhea or other problems with the medicine.  06/19/20 [name] is a 55 y.o. female with history of hypothyroidism, CVA with residual left hemiparesis, HTN, HLD here for evaluation of arthritis of multiple joints and positive rheumatoid factor. She has symptoms for some amount of time at least 6 months, swelling, and stiffness of joints on the right half of her body including elbow, wrist, fingers, and knee. She was evaluated in PCP clinic with highly positive inflammatory markers and RA antibodies with swollen joints noted on exam in 11/2019. She also had positive ANA and RNP antibodies. Xray of the right hand was checked and reportedly not show other cause of joint pain and no erosion. She has used some topical diclofenac without much improvement and tried tramadol also without much improvement. She has become unable to walk much due to severe pain bearing weight on her right knee and the left leg is too weak. Today she says symptoms are doing better compared to 2 months ago severity. She denies any  complaints of hair loss, rashes, lymphadenopathy, oral ulcers, raynaud's.    Labs reviewed 11/2019 ESR 114 CRP 36 RF 421.6 CCP >250 ANA pos RNP >8.0 DsDNA, Sm, SSA, SSB neg Vit D 12.7 TSH 4.850   No Rheumatology ROS completed.   PMFS History:  Patient Active Problem List   Diagnosis Date Noted   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/19/2020   Long-term use of high-risk medication 06/19/2020   Vitamin D deficiency 06/19/2020   Anogenital (venereal) warts 08/04/2017   Screening for colorectal cancer 08/04/2017   Encounter for gynecological examination with Papanicolaou smear of cervix 08/04/2017   Vertigo 08/04/2017   Tobacco abuse 06/01/2017   Hypothyroidism 05/12/2017   Acute CVA (cerebrovascular accident) ([facility]) 06/25/2013   Tremor of left hand 06/25/2013   Hemiparesis, left ([facility]) 06/25/2013   Hyperlipidemia LDL goal <70    Difficulty in walking(719.7) 08/31/2011   Unstable balance 08/31/2011   Weakness of left side of body 08/31/2011   Cerebral infarction ([facility]) 04/07/2011   Carotid artery occlusion 04/07/2011   Left hand weakness 04/07/2011   HTN (hypertension) 04/07/2011    Past Medical History:  Diagnosis Date   High cholesterol    Hypertension    Hypothyroidism    Stroke ([facility])    weakness on left side.   Vaginal Pap smear, abnormal     Family History  Problem Relation Age of Onset     Hypertension Mother    Liver disease Mother    Heart attack Father    HIV Sister    Arrhythmia Brother    CAD Brother    Arrhythmia Sister    Mood Disorder Sister    Past Surgical History:  Procedure Laterality Date   DILATION AND CURETTAGE OF UTERUS     LASER ABLATION CONDOLAMATA Bilateral 09/27/2017   Procedure: LASER ABLATION CONDYLOMA AND REMOVAL OF PERIANAL WART;  Surgeon: Florian Buff, MD;  Location: [facility];  Service: Gynecology;  Laterality: Bilateral;   MASS EXCISION Left 09/27/2017   Procedure: EXCISION LESION LEFT THIGH;  Surgeon: Florian Buff, MD;   Location: [facility];  Service: Gynecology;  Laterality: Left;   Social History   Social History Narrative   Grew up in [place], [place].   Engaged to be married.   5 children.    Eats mainly meat/junk food.   Immunization History  Administered Date(s) Administered   PFIZER Comirnaty(Gray Top)Covid-19 Tri-Sucrose Vaccine 08/26/2020   PFIZER(Purple Top)SARS-COV-2 Vaccination 12/12/2019, 01/02/2020     Objective: Vital Signs: LMP 09/11/2012    Physical Exam   Musculoskeletal Exam: ***  CDAI Exam: CDAI Score: -- Patient Global: --; Provider Global: -- Swollen: --; Tender: -- Joint Exam 10/07/2020   No joint exam has been documented for this visit   There is currently no information documented on the homunculus. Go to the Rheumatology activity and complete the homunculus joint exam.  Investigation: No additional findings.  Imaging: No results found.  Recent Labs: Lab Results  Component Value Date   WBC 4.9 07/29/2020   HGB 11.5 (L) 07/29/2020   PLT 407 (H) 07/29/2020   NA 141 07/29/2020   K 3.7 07/29/2020   CL 108 07/29/2020   CO2 25 07/29/2020   GLUCOSE 97 07/29/2020   BUN 10 07/29/2020   CREATININE 0.67 07/29/2020   BILITOT 0.3 07/29/2020   ALKPHOS 87 09/26/2017   AST 33 07/29/2020   ALT 22 07/29/2020   PROT 7.5 07/29/2020   ALBUMIN 3.5 09/26/2017   CALCIUM 8.9 07/29/2020   GFRAA 115 07/29/2020   QFTBGOLDPLUS NEGATIVE 06/19/2020    Speciality Comments: No specialty comments available.  Procedures:  No procedures performed Allergies: Lisinopril   Assessment / Plan:     Visit Diagnoses: No diagnosis found.  ***  Orders: No orders of the defined types were placed in this encounter.  No orders of the defined types were placed in this encounter.    Follow-Up Instructions: No follow-ups on file.   Collier Salina, MD  Note - This record has been created using [employer].  Chart creation errors have been sought, but may not always  have  been located. Such creation errors do not reflect on  the standard of medical care.

## 2020-10-13 ENCOUNTER — Ambulatory Visit: Payer: Medicaid Other | Admitting: Gastroenterology

## 2020-10-15 ENCOUNTER — Other Ambulatory Visit: Payer: Self-pay | Admitting: Internal Medicine

## 2020-10-15 DIAGNOSIS — M059 Rheumatoid arthritis with rheumatoid factor, unspecified: Secondary | ICD-10-CM

## 2020-10-15 NOTE — Telephone Encounter (Signed)
Attempted to contact patient to schedule follow up visit since patient no showed 10/07/2020, unable to LVM.

## 2020-10-16 NOTE — Telephone Encounter (Signed)
She needs to schedule a new follow up before it is appropriate to refill medication.

## 2020-10-21 ENCOUNTER — Other Ambulatory Visit: Payer: Self-pay | Admitting: Internal Medicine

## 2020-10-21 ENCOUNTER — Telehealth: Payer: Self-pay

## 2020-10-21 DIAGNOSIS — M059 Rheumatoid arthritis with rheumatoid factor, unspecified: Secondary | ICD-10-CM

## 2020-10-21 NOTE — Telephone Encounter (Signed)
Spoke with patient's daughter, [name], patient has been scheduled for in-person follow up visit on 10/22/2020 at 11:20 am.

## 2020-10-21 NOTE — Progress Notes (Signed)
Office Visit Note  Patient: [name]             Date of Birth: April 25, 1965           MRN: [id number]             PCP: Bernerd Limbo, MD Referring: Bernerd Limbo, MD Visit Date: 10/22/2020   Subjective:  Other (Patient reports increased pain in right shoulder, right leg and bottom of right foot. Patient also reports right hand pain and swelling. She is unable to form a complete fist. )   History of Present Illness: [name] is a 55 y.o. female here for follow up for seropositive RA on methotrexate 15 mg PO weekly since March. She reported significant joint pains during the interval and was prescribed prednisone for a month.  Since last visit she feels symptoms are worse than before with increased joint pain and swelling. In particular feels she has more swelling on her left side.  Also she describes more knee symptoms than before and decreased mobility at home. She took the oral prednisone for 1 month and symptoms were improved by this treatment.  She denies noticing any side effects or trouble taking the methotrexate.  Previous HPI: 07/29/20 [name] is a 55 y.o. female here for follow up for seropositive rheumatoid arthritis about a month after starting methotrexate 15 mg p.o. weekly.  She feels like her symptoms are improved since starting the medication none are complaining that her bones ache.  She does still have swelling in her extremities on both sides.  She denies any nausea vomiting diarrhea or other problems with the medicine.  06/19/20 [name] is a 55 y.o. female with history of hypothyroidism, CVA with residual left hemiparesis, HTN, HLD here for evaluation of arthritis of multiple joints and positive rheumatoid factor. She has symptoms for some amount of time at least 6 months, swelling, and stiffness of joints on the right half of her body including elbow, wrist, fingers, and knee. She was evaluated in PCP clinic with highly positive inflammatory markers and RA  antibodies with swollen joints noted on exam in 11/2019. She also had positive ANA and RNP antibodies. Xray of the right hand was checked and reportedly not show other cause of joint pain and no erosion. She has used some topical diclofenac without much improvement and tried tramadol also without much improvement. She has become unable to walk much due to severe pain bearing weight on her right knee and the left leg is too weak. Today she says symptoms are doing better compared to 2 months ago severity. She denies any complaints of hair loss, rashes, lymphadenopathy, oral ulcers, raynaud's.    Labs reviewed 11/2019 ESR 114 CRP 36 RF 421.6 CCP >250 ANA pos RNP >8.0 DsDNA, Sm, SSA, SSB neg Vit D 12.7 TSH 4.850   Review of Systems  Constitutional:  Positive for fatigue.  HENT:  Negative for mouth sores, mouth dryness and nose dryness.   Eyes:  Negative for pain, itching and dryness.  Respiratory:  Negative for shortness of breath and difficulty breathing.   Cardiovascular:  Negative for chest pain and palpitations.  Gastrointestinal:  Negative for blood in stool, constipation and diarrhea.  Endocrine: Negative for increased urination.  Genitourinary:  Negative for difficulty urinating.  Musculoskeletal:  Positive for joint pain, joint pain, joint swelling and morning stiffness. Negative for myalgias, muscle tenderness and myalgias.  Skin:  Negative for color change, rash and redness.  Allergic/Immunologic: Negative for susceptible to  infections.  Neurological:  Positive for dizziness. Negative for numbness, headaches and memory loss.  Hematological:  Negative for bruising/bleeding tendency.  Psychiatric/Behavioral:  Negative for confusion.    PMFS History:  Patient Active Problem List   Diagnosis Date Noted   Seropositive rheumatoid arthritis ([place]) 06/19/2020   Long-term use of high-risk medication 06/19/2020   Vitamin D deficiency 06/19/2020   Anogenital (venereal) warts 08/04/2017    Screening for colorectal cancer 08/04/2017   Encounter for gynecological examination with Papanicolaou smear of cervix 08/04/2017   Vertigo 08/04/2017   Tobacco abuse 06/01/2017   Hypothyroidism 05/12/2017   Acute CVA (cerebrovascular accident) ([place]) 06/25/2013   Tremor of left hand 06/25/2013   Hemiparesis, left ([place]) 06/25/2013   Hyperlipidemia LDL goal <70    Difficulty in walking(719.7) 08/31/2011   Unstable balance 08/31/2011   Weakness of left side of body 08/31/2011   Cerebral infarction ([place]) 04/07/2011   Carotid artery occlusion 04/07/2011   Left hand weakness 04/07/2011   HTN (hypertension) 04/07/2011    Past Medical History:  Diagnosis Date   High cholesterol    Hypertension    Hypothyroidism    Stroke ([place])    weakness on left side.   Vaginal Pap smear, abnormal     Family History  Problem Relation Age of Onset   Hypertension Mother    Liver disease Mother    Heart attack Father    HIV Sister    Arrhythmia Brother    CAD Brother    Arrhythmia Sister    Mood Disorder Sister    Past Surgical History:  Procedure Laterality Date   DILATION AND CURETTAGE OF UTERUS     LASER ABLATION CONDOLAMATA Bilateral 09/27/2017   Procedure: LASER ABLATION CONDYLOMA AND REMOVAL OF PERIANAL WART;  Surgeon: Florian Buff, MD;  Location: [facility];  Service: Gynecology;  Laterality: Bilateral;   MASS EXCISION Left 09/27/2017   Procedure: EXCISION LESION LEFT THIGH;  Surgeon: Florian Buff, MD;  Location: [facility];  Service: Gynecology;  Laterality: Left;   Social History   Social History Narrative   Grew up in [place], [place].   Engaged to be married.   5 children.    Eats mainly meat/junk food.   Immunization History  Administered Date(s) Administered   PFIZER Comirnaty(Gray Top)Covid-19 Tri-Sucrose Vaccine 08/26/2020   PFIZER(Purple Top)SARS-COV-2 Vaccination 12/12/2019, 01/02/2020     Objective: Vital Signs: BP 123/83 (BP Location: Right Arm, Patient Position:  Sitting, Cuff Size: Normal)   Pulse (!) 114   Ht _0  (1.676 m)   Wt 280 lb (127 kg) Comment: per patient  LMP 09/11/2012   BMI 45.19 kg/m    Physical Exam Constitutional:      Appearance: She is obese.  Skin:    General: Skin is warm and dry.     Findings: No rash.  Neurological:     Mental Status: She is alert.     Comments: Left side hemiparesis  Psychiatric:        Mood and Affect: Mood normal.     Musculoskeletal Exam:  Neck full ROM no tenderness Shoulders left range of motion severely reduced in all planes, no tenderness to palpation Elbows bilaterally reduced range of motion left and contracture right with nontender swelling decreased extension Fingers right second and third digits with swelling and tenderness over the MCP joints, decreased flexion range of motion, left hand with more diffuse edema without discrete joint synovitis palpable Knees bilateral flexion and extension range of motion decreased  mild effusion and swelling and joint line tenderness to palpation no warmth or erythema   CDAI Exam: CDAI Score: 21  Patient Global: 70 mm; Provider Global: 50 mm Swollen: 5 ; Tender: 4  Joint Exam 10/22/2020      Right  Left  Elbow  Swollen      MCP 2  Swollen Tender     MCP 3  Swollen Tender     Knee  Swollen Tender  Swollen Tender     Investigation: No additional findings.  Imaging: No results found.  Recent Labs: Lab Results  Component Value Date   WBC 6.2 10/22/2020   HGB 10.1 (L) 10/22/2020   PLT 414 (H) 10/22/2020   NA 138 10/22/2020   K 3.7 10/22/2020   CL 102 10/22/2020   CO2 27 10/22/2020   GLUCOSE 105 (H) 10/22/2020   BUN 11 10/22/2020   CREATININE 0.66 10/22/2020   BILITOT 0.4 10/22/2020   ALKPHOS 87 09/26/2017   AST 46 (H) 10/22/2020   ALT 21 10/22/2020   PROT 8.3 (H) 10/22/2020   ALBUMIN 3.5 09/26/2017   CALCIUM 9.3 10/22/2020   GFRAA 115 07/29/2020   QFTBGOLDPLUS NEGATIVE 06/19/2020    Speciality Comments: No specialty  comments available.  Procedures:  No procedures performed Allergies: Lisinopril   Assessment / Plan:     Visit Diagnoses: Seropositive rheumatoid arthritis ([place]) - Plan: methotrexate (RHEUMATREX) 2.5 MG tablet, Sedimentation rate  Seropositive RA high amount of activity today with several swollen joints patient reports severe limitation to her mobility.  Checking sedimentation rate for disease activity monitoring this was quite high at our last visit.  Recommend increasing methotrexate to 20 mg p.o. weekly and continue folic acid 1 mg p.o. daily.  For not seeing further improvement of symptoms within the next follow-up will discuss alternative treatment options.  Long-term use of high-risk medication - Plan: CBC with Differential/Platelet, COMPLETE METABOLIC PANEL WITH GFR  Checking CBC and CMP for methotrexate toxicity monitoring.  Orders: Orders Placed This Encounter  Procedures   Sedimentation rate   CBC with Differential/Platelet   COMPLETE METABOLIC PANEL WITH GFR    Meds ordered this encounter  Medications   methotrexate (RHEUMATREX) 2.5 MG tablet    Sig: Take 8 tablets (20 mg total) by mouth once a week. Caution:Chemotherapy. Protect from light.    Dispense:  96 tablet    Refill:  0      Follow-Up Instructions: Return in about 2 months (around 12/23/2020) for RA on MTX increase f/u 8wks.   Collier Salina, MD  Note - This record has been created using [employer].  Chart creation errors have been sought, but may not always  have been located. Such creation errors do not reflect on  the standard of medical care.

## 2020-10-21 NOTE — Telephone Encounter (Signed)
Patient's daughter [name] called to check on her mom's next appointment.  I explained to [name] that her appointment was scheduled for 10/07/20 which she no showed.  [name] states her mom's leg and shoulder pain have increased and "she can barely get out of bed."  [name] requested a return call to let her know if she could schedule a virtual appointment.

## 2020-10-22 ENCOUNTER — Encounter (INDEPENDENT_AMBULATORY_CARE_PROVIDER_SITE_OTHER): Payer: Self-pay

## 2020-10-22 ENCOUNTER — Ambulatory Visit: Payer: Medicaid Other | Admitting: Internal Medicine

## 2020-10-22 ENCOUNTER — Other Ambulatory Visit: Payer: Self-pay

## 2020-10-22 ENCOUNTER — Encounter: Payer: Self-pay | Admitting: Internal Medicine

## 2020-10-22 VITALS — BP 123/83 | HR 114 | Ht 66.0 in | Wt 280.0 lb

## 2020-10-22 DIAGNOSIS — Z79899 Other long term (current) drug therapy: Secondary | ICD-10-CM | POA: Diagnosis not present

## 2020-10-22 DIAGNOSIS — M059 Rheumatoid arthritis with rheumatoid factor, unspecified: Secondary | ICD-10-CM | POA: Diagnosis not present

## 2020-10-22 MED ORDER — METHOTREXATE 2.5 MG PO TABS: 20.0000 mg | ORAL_TABLET | ORAL | 0 refills | Status: AC

## 2020-10-23 ENCOUNTER — Other Ambulatory Visit: Payer: Self-pay

## 2020-10-23 DIAGNOSIS — Z79899 Other long term (current) drug therapy: Secondary | ICD-10-CM

## 2020-10-23 LAB — COMPLETE METABOLIC PANEL WITH GFR
AG Ratio: 0.7 (calc) — ABNORMAL LOW (ref 1.0–2.5)
ALT: 21 U/L (ref 6–29)
AST: 46 U/L — ABNORMAL HIGH (ref 10–35)
Albumin: 3.3 g/dL — ABNORMAL LOW (ref 3.6–5.1)
Alkaline phosphatase (APISO): 72 U/L (ref 37–153)
BUN: 11 mg/dL (ref 7–25)
CO2: 27 mmol/L (ref 20–32)
Calcium: 9.3 mg/dL (ref 8.6–10.4)
Chloride: 102 mmol/L (ref 98–110)
Creat: 0.66 mg/dL (ref 0.50–1.03)
Globulin: 5 g/dL (calc) — ABNORMAL HIGH (ref 1.9–3.7)
Glucose, Bld: 105 mg/dL — ABNORMAL HIGH (ref 65–99)
Potassium: 3.7 mmol/L (ref 3.5–5.3)
Sodium: 138 mmol/L (ref 135–146)
Total Bilirubin: 0.4 mg/dL (ref 0.2–1.2)
Total Protein: 8.3 g/dL — ABNORMAL HIGH (ref 6.1–8.1)
eGFR: 104 mL/min/{1.73_m2} (ref >=60)

## 2020-10-23 LAB — CBC WITH DIFFERENTIAL/PLATELET
Absolute Monocytes: 180 cells/uL — ABNORMAL LOW (ref 200–950)
Basophils Absolute: 31 cells/uL (ref 0–200)
Basophils Relative: 0.5 %
Eosinophils Absolute: 12 cells/uL — ABNORMAL LOW (ref 15–500)
Eosinophils Relative: 0.2 %
HCT: 31.7 % — ABNORMAL LOW (ref 35.0–45.0)
Hemoglobin: 10.1 g/dL — ABNORMAL LOW (ref 11.7–15.5)
Lymphs Abs: 1042 cells/uL (ref 850–3900)
MCH: 28.3 pg (ref 27.0–33.0)
MCHC: 31.9 g/dL — ABNORMAL LOW (ref 32.0–36.0)
MCV: 88.8 fL (ref 80.0–100.0)
MPV: 9.3 fL (ref 7.5–12.5)
Monocytes Relative: 2.9 %
Neutro Abs: 4935 cells/uL (ref 1500–7800)
Neutrophils Relative %: 79.6 %
Platelets: 414 10*3/uL — ABNORMAL HIGH (ref 140–400)
RBC: 3.57 10*6/uL — ABNORMAL LOW (ref 3.80–5.10)
RDW: 15 % (ref 11.0–15.0)
Total Lymphocyte: 16.8 %
WBC: 6.2 10*3/uL (ref 3.8–10.8)

## 2020-10-23 LAB — SEDIMENTATION RATE: Sed Rate: 126 mm/h — ABNORMAL HIGH (ref 0–30)

## 2020-10-23 NOTE — Progress Notes (Signed)
Please sign future CMP order per lab note. Thanks!

## 2020-10-23 NOTE — Progress Notes (Signed)
Sedimentation rate an indication of inflammation activity is increased compared to 2 months ago. Her lab tests look okay to take the methotrexate as planned, one liver enzyme test is slightly outside normal range I do not suspect a problem currently but she should have lab (complete metabolic panel) rechecked in the next 2-4 weeks.

## 2020-10-26 ENCOUNTER — Telehealth: Payer: Self-pay

## 2020-10-26 NOTE — Telephone Encounter (Signed)
Spoke with patient's daughter, [name]. Reviewed lab results with [name].   *She has some concerns with efficacy of MTX because her mom is still in a great deal of pain even using OTC topical and oral medications.

## 2020-10-26 NOTE — Telephone Encounter (Signed)
Attempted to contact patient's daughter, [name], LVM advising we have just increased the medication dose due to not enough response also a lapse in treatment. If this is not significantly improved we will be planning to add additional medication in our next follow up. Additional medication planned would be injectable, biologic medication that do increase risk for infection and other side effects so would like to maximize the benefit of one medicine first.

## 2020-10-26 NOTE — Telephone Encounter (Signed)
We have just increased the medication dose due to not enough response also a lapse in treatment. If this is not significantly improved we will be planning to add additional medication in our next follow up. Additional medication planned would be injectable, biologic medication that do increase risk for infection and other side effects so would like to maximize the benefit of one medicine first.

## 2020-10-26 NOTE — Telephone Encounter (Signed)
Patient's daughter [name] requested a return call regarding her mom's labwork results.  [name] states she doesn't think the Methotrexate is working because her mom's pain has increased.  She also states her mom developed dry flaky skin on one of her arms and is not sure if that is a side effect from the medication.

## 2020-11-03 ENCOUNTER — Ambulatory Visit: Payer: Medicaid Other | Admitting: Gastroenterology

## 2020-12-02 ENCOUNTER — Encounter: Payer: Self-pay | Admitting: Gastroenterology

## 2020-12-02 ENCOUNTER — Other Ambulatory Visit (INDEPENDENT_AMBULATORY_CARE_PROVIDER_SITE_OTHER): Payer: Medicaid Other

## 2020-12-02 ENCOUNTER — Ambulatory Visit (INDEPENDENT_AMBULATORY_CARE_PROVIDER_SITE_OTHER): Payer: Medicaid Other | Admitting: Gastroenterology

## 2020-12-02 VITALS — BP 120/70 | HR 106 | Ht 66.0 in | Wt 280.0 lb

## 2020-12-02 DIAGNOSIS — D649 Anemia, unspecified: Secondary | ICD-10-CM | POA: Insufficient documentation

## 2020-12-02 LAB — CBC
HCT: 30.8 % — ABNORMAL LOW (ref 36.0–46.0)
Hemoglobin: 10.1 g/dL — ABNORMAL LOW (ref 12.0–15.0)
MCHC: 33 g/dL (ref 30.0–36.0)
MCV: 87.7 fl (ref 78.0–100.0)
Platelets: 415 10*3/uL — ABNORMAL HIGH (ref 150.0–400.0)
RBC: 3.51 Mil/uL — ABNORMAL LOW (ref 3.87–5.11)
RDW: 18.7 % — ABNORMAL HIGH (ref 11.5–15.5)
WBC: 3.7 10*3/uL — ABNORMAL LOW (ref 4.0–10.5)

## 2020-12-02 LAB — B12 AND FOLATE PANEL
Folate: 21 ng/mL (ref >5.9)
Vitamin B-12: 438 pg/mL (ref 211–911)

## 2020-12-02 LAB — IBC + FERRITIN
Ferritin: 486.4 ng/mL — ABNORMAL HIGH (ref 10.0–291.0)
Iron: 49 ug/dL (ref 42–145)
Saturation Ratios: 18 % — ABNORMAL LOW (ref 20.0–50.0)
TIBC: 271.6 ug/dL (ref 250.0–450.0)
Transferrin: 194 mg/dL — ABNORMAL LOW (ref 212.0–360.0)

## 2020-12-02 MED ORDER — NA SULFATE-K SULFATE-MG SULF 17.5-3.13-1.6 GM/177ML PO SOLN
1.0000 | ORAL | 0 refills | Status: DC
Start: 2020-12-02 — End: 2022-01-15

## 2020-12-02 NOTE — Progress Notes (Signed)
12/02/2020 [name] [id number] 1965/12/02   HISTORY OF PRESENT ILLNESS: This is a 55 year old female who is new to our office.  She was a self-referral, her daughter made this appointment.  She has been anemic on recent labs.  Hemoglobin 5 months ago was 9.9 g, then in July was 10.1 g.  The latest we have for comparison prior to that was 3 years ago at 12.3 g and dating back further its always been normal.  Her MCV is actually normal at 88.8.  There is been no iron studies checked.  No stools for occult blood have been checked.  She denies any black or bloody stools.  She really denies any GI complaints at all.  She is on a full dose baby aspirin for remote history of stroke.  She continues to have left-sided weakness because of that.  She also admits that she uses 2 Advil or Aleve daily at home for aches and pains.  She never had a colonoscopy in the past.  She says that she has done Cologuard before, the last was in 2019.   Past Medical History:  Diagnosis Date   High cholesterol    Hypertension    Hypothyroidism    Stroke (HCC)    weakness on left side.   Vaginal Pap smear, abnormal    Past Surgical History:  Procedure Laterality Date   DILATION AND CURETTAGE OF UTERUS     LASER ABLATION CONDOLAMATA Bilateral 09/27/2017   Procedure: LASER ABLATION CONDYLOMA AND REMOVAL OF PERIANAL WART;  Surgeon: [name], MD;  Location: [facility];  Service: Gynecology;  Laterality: Bilateral;   MASS EXCISION Left 09/27/2017   Procedure: EXCISION LESION LEFT THIGH;  Surgeon: [name], MD;  Location: [facility];  Service: Gynecology;  Laterality: Left;    reports that she has been smoking cigarettes. She has a 16.00 pack-year smoking history. She has never used smokeless tobacco. She reports current drug use. Drug: Marijuana. She reports that she does not drink alcohol. family history includes Arrhythmia in her brother and sister; CAD in her brother; HIV in her sister; Heart attack in her  father; Hypertension in her mother; Liver disease in her mother; Mood Disorder in her sister. Allergies  Allergen Reactions   Lisinopril Nausea Only      Outpatient Encounter Medications as of 12/02/2020  Medication Sig   amLODipine (NORVASC) 5 MG tablet Take 5 mg by mouth daily.   aspirin EC 325 MG tablet Take 1 tablet (325 mg total) by mouth daily.   diclofenac Sodium (VOLTAREN) 1 % GEL Apply 2 gm to the affected joint 4 times daily as needed.   ergocalciferol (VITAMIN D2) 1.25 MG (50000 UT) capsule Take by mouth.   folic acid (FOLVITE) 1 MG tablet Take 1 tablet (1 mg total) by mouth daily.   HYDROcodone-acetaminophen (NORCO/VICODIN) 5-325 MG tablet Take 1 tablet by mouth every 6 (six) hours as needed.   ketorolac (TORADOL) 10 MG tablet TAKE 1 TABLET BY MOUTH EVERY 8 HOURS AS NEEDED   levothyroxine (SYNTHROID, LEVOTHROID) 50 MCG tablet Take 1 tablet (50 mcg total) by mouth daily.   meclizine (ANTIVERT) 25 MG tablet Take 1 tablet (25 mg total) by mouth 3 (three) times daily as needed for dizziness.   methotrexate (RHEUMATREX) 2.5 MG tablet Take 8 tablets (20 mg total) by mouth once a week. Caution:Chemotherapy. Protect from light.   Misc. Devices (CANE) MISC 1 each by Does not apply route as directed.  pravastatin (PRAVACHOL) 40 MG tablet Take 1 tablet (40 mg total) by mouth daily.   predniSONE (DELTASONE) 10 MG tablet Take 1 tablet (10 mg total) by mouth daily. Take with food.   silver sulfADIAZINE (SILVADENE) 1 % cream Apply 3-4 times daily to wound   traMADol (ULTRAM) 50 MG tablet Take by mouth.   No facility-administered encounter medications on file as of 12/02/2020.     REVIEW OF SYSTEMS  : All other systems reviewed and negative except where noted in the History of Present Illness.   PHYSICAL EXAM: BP 120/70   Pulse (!) 106   Ht 5\' 6"  (1.676 m)   Wt 280 lb (127 kg)   LMP 09/11/2012   BMI 45.19 kg/m  General: Well developed AA female in no acute distress; in  wheelchair Head: Normocephalic and atraumatic Eyes:  Sclerae anicteric, conjunctiva pink. Ears: Normal auditory acuity Lungs: Clear throughout to auscultation; no W/R/R. Heart: Regular rate and rhythm; no M/R/G. Abdomen: Soft, non-distended.  BS present.  Non-tender. Rectal:  Will be done at the time of colonoscopy. Musculoskeletal: Symmetrical with no gross deformities  Skin: No lesions on visible extremities Extremities: No edema  Neurological: Alert oriented x 4, left-sided weakness Psychological:  Alert and cooperative. Normal mood and affect  ASSESSMENT AND PLAN: *Normocytic anemia: Hemoglobin has been down over the past several months, 10.1 g most recently.  Dating back over several years it looks like it has been normal between 12 and 12.5 g.  MCV is normal.  No iron studies have been checked.  No stools for occult blood have been checked.  There is no sign of overt GI bleeding and no GI complaints.  That being said, she has never had a colonoscopy.  She has done Cologuard, last was in 2019.  We will plan for EGD and colonoscopy with Dr. 2020 as he has the next spot available at [facility].  Patient was unable to stand to get her weight in the office today, although she ambulates some at home.  She has weakness due to remote history of stroke.  We will check iron studies, B12, folate levels, and a repeat CBC today.  She does use ibuprofen on a regular basis so question of this could be some gastritis or ulcer, although once again no sign of overt GI bleeding.   CC:  [facility], MD

## 2020-12-02 NOTE — Patient Instructions (Addendum)
If you are age 54 or younger, your body mass index should be between 19-25. Your Body mass index is 45.19 kg/m. If this is out of the aformentioned range listed, please consider follow up with your Primary Care Provider.   __________________________________________________________  The [facility] providers would like to encourage you to use [facility] to communicate with providers for non-urgent requests or questions.  Due to long hold times on the telephone, sending your provider a message by [facility] may be a faster and more efficient way to get a response.  Please allow 48 business hours for a response.  Please remember that this is for non-urgent requests.   Your provider has requested that you go to the basement level for lab work before leaving today. Press "B" on the elevator. The lab is located at the first door on the left as you exit the elevator.   You have been scheduled for an endoscopy and colonoscopy. Please follow the written instructions given to you at your visit today. Please pick up your prep supplies at the pharmacy within the next 1-3 days. If you use inhalers (even only as needed), please bring them with you on the day of your procedure.  Due to recent changes in healthcare laws, you may see the results of your imaging and laboratory studies on MyChart before your provider has had a chance to review them.  We understand that in some cases there may be results that are confusing or concerning to you. Not all laboratory results come back in the same time frame and the provider may be waiting for multiple results in order to interpret others.  Please give [place] 48 hours in order for your provider to thoroughly review all the results before contacting the office for clarification of your results.   Thank you for entrusting me with your care and choosing [facility].  Doug Sou, PA-C

## 2020-12-02 NOTE — Progress Notes (Signed)
I agree with the above note, plan 

## 2020-12-22 NOTE — Progress Notes (Signed)
Office Visit Note  Patient: [name]             Date of Birth: 1965/09/02           MRN: [id number]             PCP: Bernerd Limbo, MD Referring: Bernerd Limbo, MD Visit Date: 12/23/2020   Subjective:  Follow-up (Patient is doing well overall on MTX 20 mg weekly.)   History of Present Illness: [name] is a 55 y.o. female here for follow up for seropositive RA after increasing methotrexate to 20 mg PO weekly due to increased pain and swelling especially on left side.  She feels symptoms have improved with decreased swelling pain and stiffness of the right hand with improved strength and range of movement.  She continues similar persistent swelling on her left arm.  Otherwise she noticed no problems or other changes since increasing the medication dose.  Previous HPI 10/22/20 [name] is a 55 y.o. female here for follow up for seropositive RA on methotrexate 15 mg PO weekly since March. She reported significant joint pains during the interval and was prescribed prednisone for a month.  Since last visit she feels symptoms are worse than before with increased joint pain and swelling. In particular feels she has more swelling on her left side.  Also she describes more knee symptoms than before and decreased mobility at home. She took the oral prednisone for 1 month and symptoms were improved by this treatment.  She denies noticing any side effects or trouble taking the methotrexate.    06/19/20 [name] is a 54 y.o. female with history of hypothyroidism, CVA with residual left hemiparesis, HTN, HLD here for evaluation of arthritis of multiple joints and positive rheumatoid factor. She has symptoms for some amount of time at least 6 months, swelling, and stiffness of joints on the right half of her body including elbow, wrist, fingers, and knee. She was evaluated in PCP clinic with highly positive inflammatory markers and RA antibodies with swollen joints noted on exam in 11/2019.  She also had positive ANA and RNP antibodies. Xray of the right hand was checked and reportedly not show other cause of joint pain and no erosion. She has used some topical diclofenac without much improvement and tried tramadol also without much improvement. She has become unable to walk much due to severe pain bearing weight on her right knee and the left leg is too weak. Today she says symptoms are doing better compared to 2 months ago severity. She denies any complaints of hair loss, rashes, lymphadenopathy, oral ulcers, raynaud's.    Labs reviewed 11/2019 ESR 114 CRP 36 RF 421.6 CCP >250 ANA pos RNP >8.0 DsDNA, Sm, SSA, SSB neg Vit D 12.7 TSH 4.850   Review of Systems  Constitutional:  Negative for fatigue.  HENT:  Negative for mouth sores, mouth dryness and nose dryness.   Eyes:  Negative for pain, itching, visual disturbance and dryness.  Respiratory:  Negative for cough, hemoptysis, shortness of breath and difficulty breathing.   Cardiovascular:  Negative for chest pain, palpitations and swelling in legs/feet.  Gastrointestinal:  Negative for abdominal pain, blood in stool, constipation and diarrhea.  Endocrine: Negative for increased urination.  Genitourinary:  Negative for painful urination.  Musculoskeletal:  Positive for joint swelling. Negative for joint pain, joint pain, myalgias, muscle weakness, morning stiffness, muscle tenderness and myalgias.  Skin:  Negative for color change, rash and redness.  Allergic/Immunologic: Negative for susceptible  to infections.  Neurological:  Positive for numbness. Negative for dizziness, headaches, memory loss and weakness.  Hematological:  Negative for swollen glands.  Psychiatric/Behavioral:  Negative for confusion and sleep disturbance.    PMFS History:  Patient Active Problem List   Diagnosis Date Noted   Normocytic anemia 12/02/2020   Seropositive rheumatoid arthritis ([place]) 06/19/2020   High risk medication use 06/19/2020    Vitamin D deficiency 06/19/2020   Anogenital (venereal) warts 08/04/2017   Screening for colorectal cancer 08/04/2017   Encounter for gynecological examination with Papanicolaou smear of cervix 08/04/2017   Vertigo 08/04/2017   Tobacco abuse 06/01/2017   Hypothyroidism 05/12/2017   Acute CVA (cerebrovascular accident) ([place]) 06/25/2013   Tremor of left hand 06/25/2013   Hemiparesis, left ([place]) 06/25/2013   Hyperlipidemia LDL goal <70    Difficulty in walking(719.7) 08/31/2011   Unstable balance 08/31/2011   Weakness of left side of body 08/31/2011   Cerebral infarction ([place]) 04/07/2011   Carotid artery occlusion 04/07/2011   Left hand weakness 04/07/2011   HTN (hypertension) 04/07/2011    Past Medical History:  Diagnosis Date   High cholesterol    Hypertension    Hypothyroidism    Stroke ([place])    weakness on left side.   Vaginal Pap smear, abnormal     Family History  Problem Relation Age of Onset   Hypertension Mother    Liver disease Mother    Heart attack Father    HIV Sister    Arrhythmia Brother    CAD Brother    Arrhythmia Sister    Mood Disorder Sister    Past Surgical History:  Procedure Laterality Date   DILATION AND CURETTAGE OF UTERUS     LASER ABLATION CONDOLAMATA Bilateral 09/27/2017   Procedure: LASER ABLATION CONDYLOMA AND REMOVAL OF PERIANAL WART;  Surgeon: Florian Buff, MD;  Location: [facility];  Service: Gynecology;  Laterality: Bilateral;   MASS EXCISION Left 09/27/2017   Procedure: EXCISION LESION LEFT THIGH;  Surgeon: Florian Buff, MD;  Location: [facility];  Service: Gynecology;  Laterality: Left;   Social History   Social History Narrative   Grew up in [place], [place].   Engaged to be married.   5 children.    Eats mainly meat/junk food.   Immunization History  Administered Date(s) Administered   PFIZER Comirnaty(Gray Top)Covid-19 Tri-Sucrose Vaccine 08/26/2020   PFIZER(Purple Top)SARS-COV-2 Vaccination 12/12/2019, 01/02/2020      Objective: Vital Signs: BP 114/76 (BP Location: Right Arm, Patient Position: Sitting, Cuff Size: Normal)   Pulse (!) 114   Ht 5' 6" (1.676 m)   Wt 283 lb (128.4 kg)   LMP 09/11/2012   BMI 45.68 kg/m    Physical Exam Constitutional:      Appearance: She is obese.     Comments: In wheelchair  Cardiovascular:     Rate and Rhythm: Normal rate and regular rhythm.  Pulmonary:     Effort: Pulmonary effort is normal.     Breath sounds: Normal breath sounds.  Skin:    General: Skin is warm and dry.     Findings: No rash.  Neurological:     Mental Status: She is alert.     Comments: Left hemiparesis worse in upper extremity  Psychiatric:        Mood and Affect: Mood normal.     Musculoskeletal Exam:  Right shoulder range of motion normal no tenderness, right elbow mild swelling no tenderness to palpation decreased extension range of motion with  hard endpoint flexion normal, swelling over second third MCP joints full range of motion no tenderness to palpation Left upper extremity limited with hemiparesis, swelling throughout left hand most consistent with edema not localized synovitis Knees slightly decreased extension range of motion some tenderness to joint line pressure no palpable effusions  CDAI Exam: CDAI Score: 12  Patient Global: 30 mm; Provider Global: 40 mm Swollen: 3 ; Tender: 2  Joint Exam 12/23/2020      Right  Left  Elbow  Swollen      MCP 2  Swollen      MCP 3  Swollen      Knee   Tender   Tender     Investigation: No additional findings.  Imaging: No results found.  Recent Labs: Lab Results  Component Value Date   WBC 3.7 (L) 12/02/2020   HGB 10.1 (L) 12/02/2020   PLT 415.0 (H) 12/02/2020   NA 138 10/22/2020   K 3.7 10/22/2020   CL 102 10/22/2020   CO2 27 10/22/2020   GLUCOSE 105 (H) 10/22/2020   BUN 11 10/22/2020   CREATININE 0.66 10/22/2020   BILITOT 0.4 10/22/2020   ALKPHOS 87 09/26/2017   AST 46 (H) 10/22/2020   ALT 21 10/22/2020    PROT 8.3 (H) 10/22/2020   ALBUMIN 3.5 09/26/2017   CALCIUM 9.3 10/22/2020   GFRAA 115 07/29/2020   QFTBGOLDPLUS NEGATIVE 06/19/2020    Speciality Comments: No specialty comments available.  Procedures:  No procedures performed Allergies: Lisinopril   Assessment / Plan:     Visit Diagnoses: Seropositive rheumatoid arthritis ([place])  Reported symptoms significantly better with increased medicine dose although objectively synovitis remains on exam.  Discussed we could continue the current methotrexate dose at 20 mg p.o. weekly and continue folic acid 1 mg daily but if inflammation gets worse again or any evidence of joint deformity or progression would need to consider adding additional treatment.  High risk medication use  Recent labs were obtained within the last month no evidence of significant cytopenia or hepatotoxicity.  Very slight AST elevation.  She has upcoming appointments with primary care and endoscopy will need repeat labs for monitoring at next follow-up.  Orders: No orders of the defined types were placed in this encounter.  No orders of the defined types were placed in this encounter.    Follow-Up Instructions: Return in about 4 weeks (around 01/20/2021) for RA on MTX f/u 4wks.   Collier Salina, MD  Note - This record has been created using [employer].  Chart creation errors have been sought, but may not always  have been located. Such creation errors do not reflect on  the standard of medical care.

## 2020-12-23 ENCOUNTER — Encounter: Payer: Self-pay | Admitting: Internal Medicine

## 2020-12-23 ENCOUNTER — Ambulatory Visit: Payer: Medicaid Other | Admitting: Internal Medicine

## 2020-12-23 ENCOUNTER — Other Ambulatory Visit: Payer: Self-pay

## 2020-12-23 VITALS — BP 114/76 | HR 114 | Ht 66.0 in | Wt 283.0 lb

## 2020-12-23 DIAGNOSIS — M059 Rheumatoid arthritis with rheumatoid factor, unspecified: Secondary | ICD-10-CM

## 2020-12-23 DIAGNOSIS — Z79899 Other long term (current) drug therapy: Secondary | ICD-10-CM | POA: Diagnosis not present

## 2020-12-27 ENCOUNTER — Encounter (HOSPITAL_COMMUNITY): Payer: Self-pay | Admitting: Anesthesiology

## 2020-12-27 NOTE — Anesthesia Preprocedure Evaluation (Deleted)
Anesthesia Evaluation    Reviewed: Allergy & Precautions, H&P , Patient's Chart, lab work & pertinent test results  Airway        Dental   Pulmonary neg pulmonary ROS, Current Smoker,           Cardiovascular Exercise Tolerance: Good hypertension, Pt. on medications negative cardio ROS       Neuro/Psych CVA, Residual Symptoms negative neurological ROS  negative psych ROS   GI/Hepatic negative GI ROS, Neg liver ROS,   Endo/Other  negative endocrine ROSHypothyroidism Morbid obesity  Renal/GU negative Renal ROS  negative genitourinary   Musculoskeletal  (+) Arthritis , Osteoarthritis,    Abdominal   Peds  Hematology negative hematology ROS (+) Blood dyscrasia, anemia ,   Anesthesia Other Findings   Reproductive/Obstetrics negative OB ROS                             Anesthesia Physical Anesthesia Plan  ASA: 3  Anesthesia Plan: MAC   Post-op Pain Management:    Induction: Intravenous  PONV Risk Score and Plan: 1 and Propofol infusion and Treatment may vary due to age or medical condition  Airway Management Planned: Nasal Cannula  Additional Equipment:   Intra-op Plan:   Post-operative Plan:   Informed Consent: I have reviewed the patients History and Physical, chart, labs and discussed the procedure including the risks, benefits and alternatives for the proposed anesthesia with the patient or authorized representative who has indicated his/her understanding and acceptance.       Plan Discussed with:   Anesthesia Plan Comments:         Anesthesia Quick Evaluation

## 2020-12-28 ENCOUNTER — Telehealth: Payer: Self-pay | Admitting: Gastroenterology

## 2020-12-28 ENCOUNTER — Encounter (HOSPITAL_COMMUNITY): Admission: RE | Payer: Self-pay | Source: Home / Self Care

## 2020-12-28 ENCOUNTER — Ambulatory Visit (HOSPITAL_COMMUNITY): Admission: RE | Admit: 2020-12-28 | Payer: Medicaid Other | Source: Home / Self Care | Admitting: Gastroenterology

## 2020-12-28 SURGERY — COLONOSCOPY WITH PROPOFOL
Anesthesia: Monitor Anesthesia Care

## 2020-12-28 NOTE — Telephone Encounter (Signed)
Patty, Can you touch base with her. See is/when she is planning to reschedule.  Thanks

## 2020-12-28 NOTE — Telephone Encounter (Signed)
Left message on machine to call back  

## 2020-12-28 NOTE — Telephone Encounter (Signed)
Patient is returning your call.  

## 2020-12-28 NOTE — Telephone Encounter (Signed)
-----   Message from Sammuel Cooper, PA-C sent at 12/26/2020  2:54 PM EDT ----- Regarding: cancel procedure Patient called today, Saturday, 12/26/2020 to say that she wanted to cancel her procedure for Monday and that she would call back to reschedule. She related that her nerves are all torn up and she just cannot go through with a procedure at this time.

## 2020-12-29 NOTE — Telephone Encounter (Signed)
I spoke with the pt's daughter and she states the pt does not want to reschedule at this time.  She prefers to call back if she changes her mind. FYI Dr Christella Hartigan

## 2020-12-29 NOTE — Telephone Encounter (Signed)
Patient is calling to reschedule procedure.  

## 2020-12-30 NOTE — Telephone Encounter (Signed)
Left message on machine to call back  

## 2020-12-31 NOTE — Telephone Encounter (Signed)
Left message on machine to call back  

## 2021-01-01 NOTE — Telephone Encounter (Signed)
Left message on machine to call back  

## 2021-01-04 NOTE — Telephone Encounter (Signed)
Line busy

## 2021-01-06 NOTE — Telephone Encounter (Signed)
Unable to reach pt will mail letter  

## 2021-01-19 NOTE — Progress Notes (Deleted)
Office Visit Note  Patient: [name]             Date of Birth: Nov 06, 1965           MRN: [id number]             PCP: Bernerd Limbo, MD Referring: Bernerd Limbo, MD Visit Date: 01/20/2021   Subjective:  No chief complaint on file.   History of Present Illness: Deashia Soule is a 55 y.o. 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 ***   Previous HPI 12/23/20 Malijah Lietz is a 55 y.o. female here for follow up for seropositive RA after increasing methotrexate to 20 mg PO weekly due to increased pain and swelling especially on left side.  She feels symptoms have improved with decreased swelling pain and stiffness of the right hand with improved strength and range of movement.  She continues similar persistent swelling on her left arm.  Otherwise she noticed no problems or other changes since increasing the medication dose.    06/19/20 Marjani Kobel is a 55 y.o. female with history of hypothyroidism, CVA with residual left hemiparesis, HTN, HLD here for evaluation of arthritis of multiple joints and positive rheumatoid factor. She has symptoms for some amount of time at least 6 months, swelling, and stiffness of joints on the right half of her body including elbow, wrist, fingers, and knee. She was evaluated in PCP clinic with highly positive inflammatory markers and RA antibodies with swollen joints noted on exam in 11/2019. She also had positive ANA and RNP antibodies. Xray of the right hand was checked and reportedly not show other cause of joint pain and no erosion. She has used some topical diclofenac without much improvement and tried tramadol also without much improvement. She has become unable to walk much due to severe pain bearing weight on her right knee and the left leg is too weak. Today she says symptoms are doing better compared to 2 months ago severity. She denies any complaints of hair loss, rashes, lymphadenopathy, oral ulcers, raynaud's.     Labs reviewed 11/2019 ESR 114 CRP 36 RF 421.6 CCP >250 ANA pos RNP >8.0 DsDNA, Sm, SSA, SSB neg Vit D 12.7 TSH 4.850   No Rheumatology ROS completed.   PMFS History:  Patient Active Problem List   Diagnosis Date Noted   Normocytic anemia 12/02/2020   Seropositive rheumatoid arthritis ([place]) 06/19/2020   High risk medication use 06/19/2020   Vitamin D deficiency 06/19/2020   Anogenital (venereal) warts 08/04/2017   Screening for colorectal cancer 08/04/2017   Encounter for gynecological examination with Papanicolaou smear of cervix 08/04/2017   Vertigo 08/04/2017   Tobacco abuse 06/01/2017   Hypothyroidism 05/12/2017   Acute CVA (cerebrovascular accident) ([place]) 06/25/2013   Tremor of left hand 06/25/2013   Hemiparesis, left ([place]) 06/25/2013   Hyperlipidemia LDL goal <70    Difficulty in walking(719.7) 08/31/2011   Unstable balance 08/31/2011   Weakness of left side of body 08/31/2011   Cerebral infarction ([place]) 04/07/2011   Carotid artery occlusion 04/07/2011   Left hand weakness 04/07/2011   HTN (hypertension) 04/07/2011    Past Medical History:  Diagnosis Date   High cholesterol    Hypertension    Hypothyroidism    Stroke ([place])    weakness on left side.   Vaginal Pap smear, abnormal     Family History  Problem Relation Age of Onset   Hypertension Mother    Liver disease  Mother    Heart attack Father    HIV Sister    Arrhythmia Brother    CAD Brother    Arrhythmia Sister    Mood Disorder Sister    Past Surgical History:  Procedure Laterality Date   DILATION AND CURETTAGE OF UTERUS     LASER ABLATION CONDOLAMATA Bilateral 09/27/2017   Procedure: LASER ABLATION CONDYLOMA AND REMOVAL OF PERIANAL WART;  Surgeon: Florian Buff, MD;  Location: [facility];  Service: Gynecology;  Laterality: Bilateral;   MASS EXCISION Left 09/27/2017   Procedure: EXCISION LESION LEFT THIGH;  Surgeon: Florian Buff, MD;  Location: [facility];  Service: Gynecology;  Laterality: Left;    Social History   Social History Narrative   Grew up in [place], [place].   Engaged to be married.   5 children.    Eats mainly meat/junk food.   Immunization History  Administered Date(s) Administered   PFIZER Comirnaty(Gray Top)Covid-19 Tri-Sucrose Vaccine 08/26/2020   PFIZER(Purple Top)SARS-COV-2 Vaccination 12/12/2019, 01/02/2020     Objective: Vital Signs: LMP 09/11/2012    Physical Exam   Musculoskeletal Exam: ***  CDAI Exam: CDAI Score: -- Patient Global: --; Provider Global: -- Swollen: --; Tender: -- Joint Exam 01/20/2021   No joint exam has been documented for this visit   There is currently no information documented on the homunculus. Go to the Rheumatology activity and complete the homunculus joint exam.  Investigation: No additional findings.  Imaging: No results found.  Recent Labs: Lab Results  Component Value Date   WBC 3.7 (L) 12/02/2020   HGB 10.1 (L) 12/02/2020   PLT 415.0 (H) 12/02/2020   NA 138 10/22/2020   K 3.7 10/22/2020   CL 102 10/22/2020   CO2 27 10/22/2020   GLUCOSE 105 (H) 10/22/2020   BUN 11 10/22/2020   CREATININE 0.66 10/22/2020   BILITOT 0.4 10/22/2020   ALKPHOS 87 09/26/2017   AST 46 (H) 10/22/2020   ALT 21 10/22/2020   PROT 8.3 (H) 10/22/2020   ALBUMIN 3.5 09/26/2017   CALCIUM 9.3 10/22/2020   GFRAA 115 07/29/2020   QFTBGOLDPLUS NEGATIVE 06/19/2020    Speciality Comments: No specialty comments available.  Procedures:  No procedures performed Allergies: Lisinopril   Assessment / Plan:     Visit Diagnoses: No diagnosis found.  ***  Orders: No orders of the defined types were placed in this encounter.  No orders of the defined types were placed in this encounter.    Follow-Up Instructions: No follow-ups on file.   Collier Salina, MD  Note - This record has been created using [employer].  Chart creation errors have been sought, but may not always  have been located. Such creation errors do  not reflect on  the standard of medical care.

## 2021-01-20 ENCOUNTER — Ambulatory Visit: Payer: Medicaid Other | Admitting: Internal Medicine

## 2021-01-27 ENCOUNTER — Ambulatory Visit: Payer: Medicaid Other | Admitting: Internal Medicine

## 2021-02-01 ENCOUNTER — Other Ambulatory Visit: Payer: Self-pay | Admitting: Internal Medicine

## 2021-02-01 MED ORDER — METHOTREXATE 2.5 MG PO TABS: 20.0000 mg | ORAL_TABLET | ORAL | 0 refills | Status: DC

## 2021-02-01 NOTE — Telephone Encounter (Signed)
Patient needs refill on MTX sent to [facility] in [place].

## 2021-02-01 NOTE — Telephone Encounter (Signed)
Next Visit: 02/10/2021  Last Visit: 12/23/2020  Last Fill: 10/22/2020  DX: Seropositive rheumatoid arthritis   Current Dose per office note 12/23/2020: methotrexate dose at 20 mg p.o. weekly   Labs: 10/22/2020 Her lab tests look okay to take the methotrexate as planned, one liver enzyme test is slightly outside normal range   Okay to refill MTX?

## 2021-02-09 ENCOUNTER — Other Ambulatory Visit: Payer: Self-pay | Admitting: *Deleted

## 2021-02-09 ENCOUNTER — Telehealth: Payer: Self-pay | Admitting: Internal Medicine

## 2021-02-09 DIAGNOSIS — M059 Rheumatoid arthritis with rheumatoid factor, unspecified: Secondary | ICD-10-CM

## 2021-02-09 DIAGNOSIS — E559 Vitamin D deficiency, unspecified: Secondary | ICD-10-CM

## 2021-02-09 DIAGNOSIS — G8194 Hemiplegia, unspecified affecting left nondominant side: Secondary | ICD-10-CM

## 2021-02-09 DIAGNOSIS — Z79899 Other long term (current) drug therapy: Secondary | ICD-10-CM

## 2021-02-09 NOTE — Telephone Encounter (Signed)
Patient's daughter calling in reference to mother. Daughter states patient is in so much pain, and hands are so swollen patient is not able to get out of bed. Patient will not be able to make appointment tomorrow. Daughter would like to know if you would be willing to do a virtual appointment instead of an in person appointment? Please call to advise. Per daughter, if she does not answer when you call, please a detailed message on voicemail.

## 2021-02-09 NOTE — Telephone Encounter (Signed)
I think a video visit is okay. Can we schedule this and move this to be at lunch time or at the end of the afternoon? Patient and her daughter would prefer if lab orders could be sent to [facility] in [place] ([address] to save a long trip due to mobility problems and having such a bad flare up. Can we send orders for ESR, CBC, and CMP to this site to collect tomorrow or the next day?

## 2021-02-09 NOTE — Telephone Encounter (Signed)
I lmom for patient's daughter. Appointment has been changed to Virtual visit at 12:30 pm 02/10/2021.

## 2021-02-09 NOTE — Progress Notes (Signed)
Telemedicine Visit Note  Patient: [name]             Date of Birth: [birth year]           MRN: [id number]             PCP: Bernerd Limbo, MD Referring: Bernerd Limbo, MD Visit Date: 02/10/2021   Subjective:  Other (Patient reports pain/stiffness and swelling in bilateral hands. Patient states she cannot make a fist. )   History of Present Illness: [name] is a 55 y.o. female here for telehealth visit by video for follow up of her seropositive rheumatoid arthritis on methotrexate 20 mg PO weekly. She is experiencing increased joint pain and swelling for the past 2 to 3 weeks unable to tightly close her right hand and cannot bear weight enough to assist in transferring out of bed and wheelchair. She reports feeling better each week after taking methotrexate but quickly worsens again within a few days and this has been worsening. Otherwise no new side effect or health changes. We reviewed her previous clinic visit still had some ongoing swelling and very high sedimentation rate tests but the pain and mobility is a lot worse.  Telemedicine visit was conducted with video between myself at [facility] health rheumatology office and [name] at home in [place], [place]. Total video call duration of 10:34. Her daughter Janett Billow was present and assisting with collateral history.  Previous HPI 12/23/20 [name] is a 55 y.o. female here for follow up for seropositive RA after increasing methotrexate to 20 mg PO weekly due to increased pain and swelling especially on left side.  She feels symptoms have improved with decreased swelling pain and stiffness of the right hand with improved strength and range of movement.  She continues similar persistent swelling on her left arm.  Otherwise she noticed no problems or other changes since increasing the medication dose.    06/19/20 [name] is a 55 y.o. female with history of hypothyroidism, CVA with residual left hemiparesis, HTN, HLD here for  evaluation of arthritis of multiple joints and positive rheumatoid factor. She has symptoms for some amount of time at least 6 months, swelling, and stiffness of joints on the right half of her body including elbow, wrist, fingers, and knee. She was evaluated in PCP clinic with highly positive inflammatory markers and RA antibodies with swollen joints noted on exam in 11/2019. She also had positive ANA and RNP antibodies. Xray of the right hand was checked and reportedly not show other cause of joint pain and no erosion. She has used some topical diclofenac without much improvement and tried tramadol also without much improvement. She has become unable to walk much due to severe pain bearing weight on her right knee and the left leg is too weak. Today she says symptoms are doing better compared to 2 months ago severity. She denies any complaints of hair loss, rashes, lymphadenopathy, oral ulcers, raynaud's.    Labs reviewed 11/2019 ESR 114 CRP 36 RF 421.6 CCP >250 ANA pos RNP >8.0 DsDNA, Sm, SSA, SSB neg Vit D 12.7   Review of Systems  Constitutional:  Negative for fatigue.  HENT:  Negative for mouth sores, mouth dryness and nose dryness.   Eyes:  Negative for pain, itching and dryness.  Respiratory:  Negative for shortness of breath and difficulty breathing.   Cardiovascular:  Negative for chest pain and palpitations.  Gastrointestinal:  Negative for blood in stool, constipation and diarrhea.  Endocrine: Negative for  increased urination.  Genitourinary:  Negative for difficulty urinating.  Musculoskeletal:  Positive for joint pain, joint pain, joint swelling, myalgias, morning stiffness, muscle tenderness and myalgias.  Skin:  Negative for color change, rash and redness.  Allergic/Immunologic: Negative for susceptible to infections.  Neurological:  Negative for dizziness, numbness, headaches, memory loss and weakness.  Hematological:  Negative for bruising/bleeding tendency.   Psychiatric/Behavioral:  Negative for confusion.    PMFS History:  Patient Active Problem List   Diagnosis Date Noted   Normocytic anemia 12/02/2020   Seropositive rheumatoid arthritis ([place]) 06/19/2020   High risk medication use 06/19/2020   Vitamin D deficiency 06/19/2020   Anogenital (venereal) warts 08/04/2017   Screening for colorectal cancer 08/04/2017   Encounter for gynecological examination with Papanicolaou smear of cervix 08/04/2017   Vertigo 08/04/2017   Tobacco abuse 06/01/2017   Hypothyroidism 05/12/2017   Acute CVA (cerebrovascular accident) ([place]) 06/25/2013   Tremor of left hand 06/25/2013   Hemiparesis, left ([place]) 06/25/2013   Hyperlipidemia LDL goal <70    Difficulty in walking(719.7) 08/31/2011   Unstable balance 08/31/2011   Weakness of left side of body 08/31/2011   Cerebral infarction ([place]) 04/07/2011   Carotid artery occlusion 04/07/2011   Left hand weakness 04/07/2011   HTN (hypertension) 04/07/2011    Past Medical History:  Diagnosis Date   High cholesterol    Hypertension    Hypothyroidism    Stroke ([place])    weakness on left side.   Vaginal Pap smear, abnormal     Family History  Problem Relation Age of Onset   Hypertension Mother    Liver disease Mother    Heart attack Father    HIV Sister    Arrhythmia Brother    CAD Brother    Arrhythmia Sister    Mood Disorder Sister    Past Surgical History:  Procedure Laterality Date   DILATION AND CURETTAGE OF UTERUS     LASER ABLATION CONDOLAMATA Bilateral 09/27/2017   Procedure: LASER ABLATION CONDYLOMA AND REMOVAL OF PERIANAL WART;  Surgeon: Florian Buff, MD;  Location: [facility];  Service: Gynecology;  Laterality: Bilateral;   MASS EXCISION Left 09/27/2017   Procedure: EXCISION LESION LEFT THIGH;  Surgeon: Florian Buff, MD;  Location: [facility];  Service: Gynecology;  Laterality: Left;   Social History   Social History Narrative   Grew up in [place], [place].   Engaged to be married.   5  children.    Eats mainly meat/junk food.   There is no immunization history for the selected administration types on file for this patient.   Objective: Vital Signs: Ht _0  (1.676 m)   Wt 280 lb (127 kg)   LMP 09/11/2012   BMI 45.19 kg/m    Physical Exam Constitutional:      Appearance: She is obese.  Neurological:     Mental Status: She is alert.  Psychiatric:        Mood and Affect: Mood normal.     Musculoskeletal Exam:  Shoulders unable to raise left arm actively Elbows full ROM no tenderness or swelling Right hand visible swelling, incomplete finger extension ROM appears able to close, left hand in contracture with hemiparesis   Investigation: No additional findings.  Imaging: No results found.  Recent Labs: Lab Results  Component Value Date   WBC 3.7 (L) 12/02/2020   HGB 10.1 (L) 12/02/2020   PLT 415.0 (H) 12/02/2020   NA 138 10/22/2020   K 3.7 10/22/2020  CL 102 10/22/2020   CO2 27 10/22/2020   GLUCOSE 105 (H) 10/22/2020   BUN 11 10/22/2020   CREATININE 0.66 10/22/2020   BILITOT 0.4 10/22/2020   ALKPHOS 87 09/26/2017   AST 46 (H) 10/22/2020   ALT 21 10/22/2020   PROT 8.3 (H) 10/22/2020   ALBUMIN 3.5 09/26/2017   CALCIUM 9.3 10/22/2020   GFRAA 115 07/29/2020   QFTBGOLDPLUS NEGATIVE 06/19/2020    Speciality Comments: No specialty comments available.  Procedures:  No procedures performed Allergies: Lisinopril   Assessment / Plan:     Visit Diagnoses: Seropositive rheumatoid arthritis ([place]) - Plan: predniSONE (DELTASONE) 10 MG tablet  Disease appears to still be at least moderately active, high CDAI and very high ESR at last clinic visit and now weeks of pain and decreased mobility with swelling. Discussed options including methotrexate increase or additional of another DMARD such as humira. She is agreeable to taking an at home injectable medication I believe this will better control disease. Continue methotrexate 20 mg PO weekly for now. I  will prescribe prednisone 10 mg PO to take daily as needed for now for uncontrolled symptoms. Will plan to proceed with starting Humira, recommending in person new start visit after lab results obtained from today.  High risk medication use  She will go for lab test at local [place] office with CBC and CMO for methotrexate toxicity monitoring. I discussed risks of TNF inhibitor treatment including injection site reactions, infections, malignancy, and heart failure. No history of cancers. No history of heart failure, TTE in 2015 without problems. Previous hepatitis and TB screening negative this year 06/2020 and negative HIV screening in 09/2017.   Orders: No orders of the defined types were placed in this encounter.  Meds ordered this encounter  Medications   predniSONE (DELTASONE) 10 MG tablet    Sig: Take 1 tablet (10 mg total) by mouth daily as needed. Take with food.    Dispense:  30 tablet    Refill:  0      Follow-Up Instructions: No follow-ups on file.   Collier Salina, MD  Note - This record has been created using [employer].  Chart creation errors have been sought, but may not always  have been located. Such creation errors do not reflect on  the standard of medical care.

## 2021-02-10 ENCOUNTER — Other Ambulatory Visit: Payer: Self-pay

## 2021-02-10 ENCOUNTER — Telehealth (INDEPENDENT_AMBULATORY_CARE_PROVIDER_SITE_OTHER): Payer: Medicaid Other | Admitting: Internal Medicine

## 2021-02-10 ENCOUNTER — Encounter: Payer: Self-pay | Admitting: Internal Medicine

## 2021-02-10 VITALS — Ht 66.0 in | Wt 280.0 lb

## 2021-02-10 DIAGNOSIS — M059 Rheumatoid arthritis with rheumatoid factor, unspecified: Secondary | ICD-10-CM

## 2021-02-10 DIAGNOSIS — Z79899 Other long term (current) drug therapy: Secondary | ICD-10-CM | POA: Diagnosis not present

## 2021-02-10 MED ORDER — PREDNISONE 10 MG PO TABS: 10.0000 mg | ORAL_TABLET | Freq: Every day | ORAL | 0 refills | Status: DC | PRN

## 2021-02-11 LAB — CMP14+EGFR
ALT: 23 IU/L (ref 0–32)
AST: 46 IU/L — ABNORMAL HIGH (ref 0–40)
Albumin/Globulin Ratio: 0.6 — ABNORMAL LOW (ref 1.2–2.2)
Albumin: 3.5 g/dL — ABNORMAL LOW (ref 3.8–4.9)
Alkaline Phosphatase: 89 IU/L (ref 44–121)
BUN/Creatinine Ratio: 18 (ref 9–23)
BUN: 12 mg/dL (ref 6–24)
Bilirubin Total: 0.4 mg/dL (ref 0.0–1.2)
CO2: 22 mmol/L (ref 20–29)
Calcium: 9.5 mg/dL (ref 8.7–10.2)
Chloride: 101 mmol/L (ref 96–106)
Creatinine, Ser: 0.67 mg/dL (ref 0.57–1.00)
Globulin, Total: 5.7 g/dL — ABNORMAL HIGH (ref 1.5–4.5)
Glucose: 99 mg/dL (ref 70–99)
Potassium: 4.1 mmol/L (ref 3.5–5.2)
Sodium: 137 mmol/L (ref 134–144)
Total Protein: 9.2 g/dL — ABNORMAL HIGH (ref 6.0–8.5)
eGFR: 104 mL/min/{1.73_m2} (ref >59)

## 2021-02-11 LAB — CBC WITH DIFFERENTIAL/PLATELET
Basophils Absolute: 0 10*3/uL (ref 0.0–0.2)
Basos: 0 %
EOS (ABSOLUTE): 0 10*3/uL (ref 0.0–0.4)
Eos: 0 %
Hematocrit: 32 % — ABNORMAL LOW (ref 34.0–46.6)
Hemoglobin: 10.8 g/dL — ABNORMAL LOW (ref 11.1–15.9)
Immature Grans (Abs): 0 10*3/uL (ref 0.0–0.1)
Immature Granulocytes: 1 %
Lymphocytes Absolute: 0.7 10*3/uL (ref 0.7–3.1)
Lymphs: 12 %
MCH: 28.1 pg (ref 26.6–33.0)
MCHC: 33.8 g/dL (ref 31.5–35.7)
MCV: 83 fL (ref 79–97)
Monocytes Absolute: 0.2 10*3/uL (ref 0.1–0.9)
Monocytes: 3 %
Neutrophils Absolute: 5.3 10*3/uL (ref 1.4–7.0)
Neutrophils: 84 %
Platelets: 561 10*3/uL — ABNORMAL HIGH (ref 150–450)
RBC: 3.84 x10E6/uL (ref 3.77–5.28)
RDW: 15.8 % — ABNORMAL HIGH (ref 11.7–15.4)
WBC: 6.3 10*3/uL (ref 3.4–10.8)

## 2021-02-11 LAB — SEDIMENTATION RATE: Sed Rate: 64 mm/hr — ABNORMAL HIGH (ref 0–40)

## 2021-02-24 NOTE — Progress Notes (Signed)
Lab results look okay the AST, a liver function test remains very slightly abnormal but no new change compared to before. Sedimentation rate of 64 is high probably ongoing arthritis inflammation. I do think she will benefit from Humira treatment for her RA. One additional test recommended for all patients before starting this drug is a chest xray. I do not have anything recent on file, has she had an xray of the chest anywhere recently, or would she be able to have this done?

## 2021-03-01 ENCOUNTER — Other Ambulatory Visit: Payer: Self-pay | Admitting: *Deleted

## 2021-03-01 DIAGNOSIS — M059 Rheumatoid arthritis with rheumatoid factor, unspecified: Secondary | ICD-10-CM

## 2021-03-01 DIAGNOSIS — Z79899 Other long term (current) drug therapy: Secondary | ICD-10-CM

## 2021-05-28 ENCOUNTER — Other Ambulatory Visit: Payer: Self-pay | Admitting: Internal Medicine

## 2021-05-28 ENCOUNTER — Telehealth: Payer: Self-pay | Admitting: Internal Medicine

## 2021-05-28 NOTE — Telephone Encounter (Signed)
See Refill request received for details.

## 2021-05-28 NOTE — Telephone Encounter (Signed)
Patient's daughter, Janett Billow, called the office requesting a refill of Methotrexate 2.5mg  to be sent to [facility] in [place].

## 2021-05-28 NOTE — Telephone Encounter (Signed)
Next Visit: 06/21/2021  Last Visit: 02/10/2021  Last Fill: 02/01/2021  DX: Seropositive rheumatoid arthritis   Current Dose per office note 02/10/2021: Continue methotrexate 20 mg PO weekly for now  Labs: 02/10/2021 Lab results look okay the AST, a liver function test remains very slightly abnormal but no new change compared to before  Okay to refill MTX?

## 2021-06-21 ENCOUNTER — Ambulatory Visit: Payer: Medicaid Other | Admitting: Internal Medicine

## 2021-07-07 DIAGNOSIS — J4520 Mild intermittent asthma, uncomplicated: Secondary | ICD-10-CM | POA: Insufficient documentation

## 2021-07-20 ENCOUNTER — Ambulatory Visit (INDEPENDENT_AMBULATORY_CARE_PROVIDER_SITE_OTHER): Payer: Medicaid Other | Admitting: Internal Medicine

## 2021-07-20 ENCOUNTER — Encounter: Payer: Self-pay | Admitting: Internal Medicine

## 2021-07-20 VITALS — BP 155/84 | HR 93 | Resp 17 | Ht 66.0 in

## 2021-07-20 DIAGNOSIS — Z79899 Other long term (current) drug therapy: Secondary | ICD-10-CM | POA: Diagnosis not present

## 2021-07-20 DIAGNOSIS — G8194 Hemiplegia, unspecified affecting left nondominant side: Secondary | ICD-10-CM | POA: Diagnosis not present

## 2021-07-20 DIAGNOSIS — M059 Rheumatoid arthritis with rheumatoid factor, unspecified: Secondary | ICD-10-CM

## 2021-07-20 MED ORDER — FOLIC ACID 1 MG PO TABS: 1.0000 mg | ORAL_TABLET | Freq: Every day | ORAL | 3 refills | Status: DC

## 2021-07-20 MED ORDER — METHOTREXATE SODIUM 2.5 MG PO TABS: 15.0000 mg | ORAL_TABLET | ORAL | 0 refills | Status: DC

## 2021-07-20 NOTE — Progress Notes (Signed)
? ?Office Visit Note ? ?Patient: [name]             ?Date of Birth: 26-Mar-1966           ?MRN: [id number]             ?PCP: Bernerd Limbo, MD ?Referring: Bernerd Limbo, MD ?Visit Date: 07/20/2021 ? ? ?Subjective:  ?Follow-up (Doing good) ? ? ?History of Present Illness: [name] is a 56 y.o. female here for follow up for seropositive RA on MTX 20 mg PO weekly and folic acid 1 mg daily. She took a course of oral prednisone after last visit in November but joint symptoms have been reasonably controlled on just methotrexate since then. We had discussed possible addition of Humira. She remains limited in mobility mostly due to left leg weakness and stiffness, using a cane at home. ? ?Her husband is present at visit today assisting with transportation.  ? ?Previous HPI ?02/10/21 ?[name] is a 56 y.o. female here for telehealth visit by video for follow up of her seropositive rheumatoid arthritis on methotrexate 20 mg PO weekly. She is experiencing increased joint pain and swelling for the past 2 to 3 weeks unable to tightly close her right hand and cannot bear weight enough to assist in transferring out of bed and wheelchair. She reports feeling better each week after taking methotrexate but quickly worsens again within a few days and this has been worsening. Otherwise no new side effect or health changes. We reviewed her previous clinic visit still had some ongoing swelling and very high sedimentation rate tests but the pain and mobility is a lot worse. ?  ?Previous HPI ?06/19/20 ?[name] is a 55 y.o. female with history of hypothyroidism, CVA with residual left hemiparesis, HTN, HLD here for evaluation of arthritis of multiple joints and positive rheumatoid factor. She has symptoms for some amount of time at least 6 months, swelling, and stiffness of joints on the right half of her body including elbow, wrist, fingers, and knee. She was evaluated in PCP clinic with highly positive inflammatory markers  and RA antibodies with swollen joints noted on exam in 11/2019. She also had positive ANA and RNP antibodies. Xray of the right hand was checked and reportedly not show other cause of joint pain and no erosion. She has used some topical diclofenac without much improvement and tried tramadol also without much improvement. She has become unable to walk much due to severe pain bearing weight on her right knee and the left leg is too weak. Today she says symptoms are doing better compared to 2 months ago severity. She denies any complaints of hair loss, rashes, lymphadenopathy, oral ulcers, raynaud's.  ?  ?Labs reviewed ?11/2019 ?ESR 114 ?CRP 36 ?RF 421.6 ?CCP >250 ?ANA pos ?RNP >8.0 ?DsDNA, Sm, SSA, SSB neg ?Vit D 12.7 ? ? ?Review of Systems  ?Constitutional:  Negative for fatigue.  ?HENT:  Positive for mouth dryness.   ?Eyes:  Negative for dryness.  ?Respiratory:  Negative for shortness of breath.   ?Cardiovascular:  Negative for swelling in legs/feet.  ?Gastrointestinal:  Negative for constipation.  ?Endocrine: Positive for heat intolerance and excessive thirst.  ?Genitourinary:  Negative for difficulty urinating.  ?Musculoskeletal:  Positive for joint pain, gait problem, joint pain and morning stiffness.  ?Skin:  Negative for rash.  ?Allergic/Immunologic: Negative for susceptible to infections.  ?Neurological:  Negative for numbness.  ?Hematological:  Negative for bruising/bleeding tendency.  ?Psychiatric/Behavioral:  Negative for sleep disturbance.   ? ?  PMFS History:  ?Patient Active Problem List  ? Diagnosis Date Noted  ? Normocytic anemia 12/02/2020  ? Seropositive rheumatoid arthritis ([place]) 06/19/2020  ? High risk medication use 06/19/2020  ? Vitamin D deficiency 06/19/2020  ? Anogenital (venereal) warts 08/04/2017  ? Screening for colorectal cancer 08/04/2017  ? Encounter for gynecological examination with Papanicolaou smear of cervix 08/04/2017  ? Vertigo 08/04/2017  ? Tobacco abuse 06/01/2017  ? Hypothyroidism  05/12/2017  ? Acute CVA (cerebrovascular accident) ([place]) 06/25/2013  ? Tremor of left hand 06/25/2013  ? Hemiparesis, left ([place]) 06/25/2013  ? Hyperlipidemia LDL goal <70   ? Difficulty in walking(719.7) 08/31/2011  ? Unstable balance 08/31/2011  ? Weakness of left side of body 08/31/2011  ? Cerebral infarction ([place]) 04/07/2011  ? Carotid artery occlusion 04/07/2011  ? Left hand weakness 04/07/2011  ? HTN (hypertension) 04/07/2011  ?  ?Past Medical History:  ?Diagnosis Date  ? High cholesterol   ? Hypertension   ? Hypothyroidism   ? Stroke [facility])   ? weakness on left side.  ? Vaginal Pap smear, abnormal   ?  ?Family History  ?Problem Relation Age of Onset  ? Hypertension Mother   ? Liver disease Mother   ? Heart attack Father   ? HIV Sister   ? Arrhythmia Sister   ? Mood Disorder Sister   ? Arrhythmia Brother   ? CAD Brother   ? ?Past Surgical History:  ?Procedure Laterality Date  ? DILATION AND CURETTAGE OF UTERUS    ? LASER ABLATION CONDOLAMATA Bilateral 09/27/2017  ? Procedure: LASER ABLATION CONDYLOMA AND REMOVAL OF PERIANAL WART;  Surgeon: Florian Buff, MD;  Location: [facility];  Service: Gynecology;  Laterality: Bilateral;  ? MASS EXCISION Left 09/27/2017  ? Procedure: EXCISION LESION LEFT THIGH;  Surgeon: Florian Buff, MD;  Location: [facility];  Service: Gynecology;  Laterality: Left;  ? ?Social History  ? ?Social History Narrative  ? Grew up in [place], [place].  ? Engaged to be married.  ? 5 children.   ? Eats mainly meat/junk food.  ? ?Immunization History  ?Administered Date(s) Administered  ? PFIZER Comirnaty(Gray Top)Covid-19 Tri-Sucrose Vaccine 08/26/2020  ? PFIZER(Purple Top)SARS-COV-2 Vaccination 12/12/2019, 01/02/2020  ?  ? ?Objective: ?Vital Signs: BP (!) 155/84 (BP Location: Right Arm, Patient Position: Sitting, Cuff Size: Normal)   Pulse 93   Resp 17   Ht 5' 6"  (1.676 m)   LMP 09/11/2012   BMI 45.19 kg/m?   ? ?Physical Exam ?Constitutional:   ?   Appearance: She is obese.  ?   Comments: In  wheelchair  ?Eyes:  ?   Conjunctiva/sclera: Conjunctivae normal.  ?Cardiovascular:  ?   Rate and Rhythm: Normal rate and regular rhythm.  ?Pulmonary:  ?   Effort: Pulmonary effort is normal.  ?   Breath sounds: Normal breath sounds.  ?Musculoskeletal:  ?   Comments: Trace pedal edema b/l  ?Skin: ?   General: Skin is warm and dry.  ?   Findings: No rash.  ?Neurological:  ?   Mental Status: She is alert.  ?   Comments: Left hemiplegia worse upper extremity  ?Psychiatric:     ?   Mood and Affect: Mood normal.  ?  ? ?Musculoskeletal Exam:  ?Shoulder ROM is restricted both sides worse left side with no active abduction ?Elbows full ROM no tenderness or swelling ?Wrists full ROM no tenderness or swelling ?Swelling over 2nd-3rd MCPs on right no tenderness to pressure and ROM intact, left  hand fingers in contracted partially flexed position ?Knees full ROM no tenderness or swelling ? ? ?CDAI Exam: ?CDAI Score: 6  ?Patient Global: 10 mm; Provider Global: 30 mm ?Swollen: 2 ; Tender: 0  ?Joint Exam 07/20/2021  ? ?   Right  Left  ?MCP 2  Swollen      ?MCP 3  Swollen      ? ? ? ?Investigation: ?No additional findings. ? ?Imaging: ?No results found. ? ?Recent Labs: ?Lab Results  ?Component Value Date  ? WBC 4.4 07/20/2021  ? HGB 10.1 (L) 07/20/2021  ? PLT 435 (H) 07/20/2021  ? NA 140 07/20/2021  ? K 3.2 (L) 07/20/2021  ? CL 103 07/20/2021  ? CO2 31 07/20/2021  ? GLUCOSE 82 07/20/2021  ? BUN 7 07/20/2021  ? CREATININE 0.65 07/20/2021  ? BILITOT 0.5 07/20/2021  ? ALKPHOS 89 02/10/2021  ? AST 31 07/20/2021  ? ALT 15 07/20/2021  ? PROT 7.5 07/20/2021  ? ALBUMIN 3.5 (L) 02/10/2021  ? CALCIUM 8.7 07/20/2021  ? GFRAA 115 07/29/2020  ? QFTBGOLDPLUS NEGATIVE 06/19/2020  ? ? ?Speciality Comments: No specialty comments available. ? ?Procedures:  ?No procedures performed ?Allergies: Lisinopril  ? ?Assessment / Plan:     ?Visit Diagnoses: Seropositive rheumatoid arthritis ([place]) - Plan: Sedimentation rate, folic acid (FOLVITE) 1 MG  tablet ? ?Appears to have a moderate amount of disease activity by my assessment but she is not having too much pain or impact on function does not want to take additional medication at this time.  We will recheck sed rate f

## 2021-07-21 LAB — CBC WITH DIFFERENTIAL/PLATELET
Absolute Monocytes: 277 cells/uL (ref 200–950)
Basophils Absolute: 31 cells/uL (ref 0–200)
Basophils Relative: 0.7 %
Eosinophils Absolute: 48 cells/uL (ref 15–500)
Eosinophils Relative: 1.1 %
HCT: 31.5 % — ABNORMAL LOW (ref 35.0–45.0)
Hemoglobin: 10.1 g/dL — ABNORMAL LOW (ref 11.7–15.5)
Lymphs Abs: 1104 cells/uL (ref 850–3900)
MCH: 27.2 pg (ref 27.0–33.0)
MCHC: 32.1 g/dL (ref 32.0–36.0)
MCV: 84.9 fL (ref 80.0–100.0)
MPV: 10.4 fL (ref 7.5–12.5)
Monocytes Relative: 6.3 %
Neutro Abs: 2939 cells/uL (ref 1500–7800)
Neutrophils Relative %: 66.8 %
Platelets: 435 10*3/uL — ABNORMAL HIGH (ref 140–400)
RBC: 3.71 10*6/uL — ABNORMAL LOW (ref 3.80–5.10)
RDW: 15.6 % — ABNORMAL HIGH (ref 11.0–15.0)
Total Lymphocyte: 25.1 %
WBC: 4.4 10*3/uL (ref 3.8–10.8)

## 2021-07-21 LAB — COMPLETE METABOLIC PANEL WITH GFR
AG Ratio: 0.7 (calc) — ABNORMAL LOW (ref 1.0–2.5)
ALT: 15 U/L (ref 6–29)
AST: 31 U/L (ref 10–35)
Albumin: 3.2 g/dL — ABNORMAL LOW (ref 3.6–5.1)
Alkaline phosphatase (APISO): 67 U/L (ref 37–153)
BUN: 7 mg/dL (ref 7–25)
CO2: 31 mmol/L (ref 20–32)
Calcium: 8.7 mg/dL (ref 8.6–10.4)
Chloride: 103 mmol/L (ref 98–110)
Creat: 0.65 mg/dL (ref 0.50–1.03)
Globulin: 4.3 g/dL (calc) — ABNORMAL HIGH (ref 1.9–3.7)
Glucose, Bld: 82 mg/dL (ref 65–99)
Potassium: 3.2 mmol/L — ABNORMAL LOW (ref 3.5–5.3)
Sodium: 140 mmol/L (ref 135–146)
Total Bilirubin: 0.5 mg/dL (ref 0.2–1.2)
Total Protein: 7.5 g/dL (ref 6.1–8.1)
eGFR: 104 mL/min/{1.73_m2} (ref >=60)

## 2021-07-21 LAB — SEDIMENTATION RATE: Sed Rate: 97 mm/h — ABNORMAL HIGH (ref 0–30)

## 2021-07-21 NOTE — Progress Notes (Signed)
Lab results look good for continuing the methotrexate. Her sed rate test is still elevated so may have inflammation but we don't need to add additional medicine unless joint pain and swelling starts giving her more trouble.

## 2021-08-31 ENCOUNTER — Encounter: Payer: Self-pay | Admitting: Adult Health

## 2021-08-31 ENCOUNTER — Ambulatory Visit (INDEPENDENT_AMBULATORY_CARE_PROVIDER_SITE_OTHER): Payer: Medicaid Other | Admitting: Adult Health

## 2021-08-31 ENCOUNTER — Other Ambulatory Visit (HOSPITAL_COMMUNITY)
Admission: RE | Admit: 2021-08-31 | Discharge: 2021-08-31 | Disposition: A | Discharge status: Home / Self Care | Payer: Medicaid Other | Source: Ambulatory Visit | Attending: Adult Health | Admitting: Adult Health

## 2021-08-31 VITALS — BP 156/91 | HR 95

## 2021-08-31 DIAGNOSIS — Z0000 Encounter for general adult medical examination without abnormal findings: Secondary | ICD-10-CM | POA: Diagnosis not present

## 2021-08-31 DIAGNOSIS — Z01419 Encounter for gynecological examination (general) (routine) without abnormal findings: Secondary | ICD-10-CM | POA: Diagnosis present

## 2021-08-31 DIAGNOSIS — Z1211 Encounter for screening for malignant neoplasm of colon: Secondary | ICD-10-CM | POA: Diagnosis not present

## 2021-08-31 LAB — HEMOCCULT GUIAC POC 1CARD (OFFICE): Fecal Occult Blood, POC: NEGATIVE

## 2021-08-31 NOTE — Progress Notes (Signed)
Patient ID: [name], female   DOB: September 04, 1965, 56 y.o.   MRN: [id number] History of Present Illness: [name] is a 56 year old black female single, PM in for gyn exam and pap. She had annual physical 07/07/21, by Daphane Shepherd PA. She is wheelchair bound.  PCP is Dr Coletta Memos   Current Medications, Allergies, Past Medical History, Past Surgical History, Family History and Social History were reviewed in [facility] record.     Review of Systems: Patient denies any headaches, hearing loss, fatigue, blurred vision, shortness of breath, chest pain, abdominal pain, problems with bowel movements, urination, or intercourse(not active). No joint pain or mood swings.  Denies any vaginal bleeding She has left hemiparesis, sp stroke and is in wheelchair    Physical Exam:BP (!) 156/91 (BP Location: Right Arm, Patient Position: Sitting, Cuff Size: Normal)   Pulse 95   LMP 09/11/2012   General:  Well developed, well nourished, no acute distress Skin:  Warm and dry Lungs; Clear to auscultation bilaterally Cardiovascular: Regular rate and rhythm Pelvic:  External genitalia is normal in appearance, no lesions.  The vagina is pale. Urethra has no lesions or masses. The cervix is poorly seen, pap with GC/CHL and HR HPV genotyping performed.  Uterus is felt to be normal size, shape, and contour.  No adnexal masses or tenderness noted.Bladder is non tender, no masses felt. Rectal: Good sphincter tone, no polyps, or hemorrhoids felt.  Hemoccult negative. Psych:  No mood changes, alert and cooperative,seems happy AA is 3 Fall risk Is low    08/31/2021    2:44 PM 08/24/2017   10:05 AM 08/04/2017   10:52 AM  Depression screen PHQ 2/9  Decreased Interest 2 2 1   Down, Depressed, Hopeless 0 1 1  PHQ - 2 Score 2 3 2   Altered sleeping 0 1 1  Tired, decreased energy 0 3 3  Change in appetite 0 3 1  Feeling bad or failure about yourself  0 1 1  Trouble concentrating 0 0 1  Moving slowly or  fidgety/restless 0 0 1  Suicidal thoughts 0 0 0  PHQ-9 Score 2 11 10   Difficult doing work/chores  Somewhat difficult Somewhat difficult       08/31/2021    2:47 PM 08/24/2017   10:07 AM  GAD 7 : Generalized Anxiety Score  Nervous, Anxious, on Edge 0 0  Control/stop worrying 0 0  Worry too much - different things 0 0  Trouble relaxing 0 0  Restless 0 0  Easily annoyed or irritable 0 0  Afraid - awful might happen 0 0  Total GAD 7 Score 0 0  Anxiety Difficulty  Somewhat difficult      Upstream - 08/31/21 1445       Pregnancy Intention Screening   Does the patient want to become pregnant in the next year? No    Does the patient's partner want to become pregnant in the next year? No    Would the patient like to discuss contraceptive options today? No      Contraception Wrap Up   Current Method --   postmenopausal   End Method --   post menopausal   Contraception Counseling Provided No            Examination chaperoned by Celene Squibb LPN  Impression and plan: 1. Encounter for gynecological examination with Papanicolaou smear of cervix Pap sent Pap in 3 years if normal Physical with PCP PCP to get mammogram performed Needs  to re do cologuard, was not collected correctly 07/31/21. Labs with PCP   2. Encounter for screening fecal occult blood testing Hemoccult negative

## 2021-09-02 LAB — CYTOLOGY - PAP
Adequacy: ABSENT
Chlamydia: NEGATIVE
Comment: NEGATIVE
Comment: NEGATIVE
Comment: NORMAL
Diagnosis: NEGATIVE
High risk HPV: NEGATIVE
Neisseria Gonorrhea: NEGATIVE

## 2021-09-03 ENCOUNTER — Other Ambulatory Visit: Payer: Self-pay | Admitting: Adult Health

## 2021-09-03 MED ORDER — METRONIDAZOLE 500 MG PO TABS: 500.0000 mg | ORAL_TABLET | Freq: Two times a day (BID) | ORAL | 0 refills | Status: DC

## 2021-09-03 NOTE — Progress Notes (Signed)
+  BV on pap will rx flagyl  

## 2021-11-01 NOTE — Progress Notes (Deleted)
Office Visit Note  Patient: [name]             Date of Birth: [birth year]           MRN: [id number]             PCP: Bernerd Limbo, MD Referring: Bernerd Limbo, MD Visit Date: 11/09/2021   Subjective:  No chief complaint on file.   History of Present Illness: [name] is a 56 y.o. female here for follow up for seropositive RA on MTX 15 mg PO weekly and folic acid 1 mg daily.    Previous HPI 07/20/2021 [name] is a 56 y.o. female here for follow up for seropositive RA on MTX 20 mg PO weekly and folic acid 1 mg daily. She took a course of oral prednisone after last visit in November but joint symptoms have been reasonably controlled on just methotrexate since then. We had discussed possible addition of Humira. She remains limited in mobility mostly due to left leg weakness and stiffness, using a cane at home.   Her husband is present at visit today assisting with transportation.    Previous HPI 02/10/21 [name] is a 56 y.o. female here for telehealth visit by video for follow up of her seropositive rheumatoid arthritis on methotrexate 20 mg PO weekly. She is experiencing increased joint pain and swelling for the past 2 to 3 weeks unable to tightly close her right hand and cannot bear weight enough to assist in transferring out of bed and wheelchair. She reports feeling better each week after taking methotrexate but quickly worsens again within a few days and this has been worsening. Otherwise no new side effect or health changes. We reviewed her previous clinic visit still had some ongoing swelling and very high sedimentation rate tests but the pain and mobility is a lot worse.   Previous HPI 06/19/20 [name] is a 56 y.o. female with history of hypothyroidism, CVA with residual left hemiparesis, HTN, HLD here for evaluation of arthritis of multiple joints and positive rheumatoid factor. She has symptoms for some amount of time at least 6 months, swelling, and  stiffness of joints on the right half of her body including elbow, wrist, fingers, and knee. She was evaluated in PCP clinic with highly positive inflammatory markers and RA antibodies with swollen joints noted on exam in 11/2019. She also had positive ANA and RNP antibodies. Xray of the right hand was checked and reportedly not show other cause of joint pain and no erosion. She has used some topical diclofenac without much improvement and tried tramadol also without much improvement. She has become unable to walk much due to severe pain bearing weight on her right knee and the left leg is too weak. Today she says symptoms are doing better compared to 2 months ago severity. She denies any complaints of hair loss, rashes, lymphadenopathy, oral ulcers, raynaud's.    Labs reviewed 11/2019 ESR 114 CRP 36 RF 421.6 CCP >250 ANA pos RNP >8.0 DsDNA, Sm, SSA, SSB neg Vit D 12.7       No Rheumatology ROS completed.   PMFS History:  Patient Active Problem List   Diagnosis Date Noted   Normocytic anemia 12/02/2020   Seropositive rheumatoid arthritis ([place]) 06/19/2020   High risk medication use 06/19/2020   Vitamin D deficiency 06/19/2020   Anogenital (venereal) warts 08/04/2017   Screening for colorectal cancer 08/04/2017   Encounter for gynecological examination with Papanicolaou smear of cervix 08/04/2017  Vertigo 08/04/2017   Tobacco abuse 06/01/2017   Hypothyroidism 05/12/2017   Acute CVA (cerebrovascular accident) ([place]) 06/25/2013   Tremor of left hand 06/25/2013   Hemiparesis, left ([place]) 06/25/2013   Hyperlipidemia LDL goal <70    Difficulty in walking(719.7) 08/31/2011   Unstable balance 08/31/2011   Weakness of left side of body 08/31/2011   Cerebral infarction ([place]) 04/07/2011   Carotid artery occlusion 04/07/2011   Left hand weakness 04/07/2011   HTN (hypertension) 04/07/2011    Past Medical History:  Diagnosis Date   High cholesterol    Hypertension    Hypothyroidism     Stroke ([place])    weakness on left side.   Vaginal Pap smear, abnormal     Family History  Problem Relation Age of Onset   Hypertension Mother    Liver disease Mother    Heart attack Father    HIV Sister    Arrhythmia Sister    Mood Disorder Sister    Arrhythmia Brother    CAD Brother    Past Surgical History:  Procedure Laterality Date   DILATION AND CURETTAGE OF UTERUS     LASER ABLATION CONDOLAMATA Bilateral 09/27/2017   Procedure: LASER ABLATION CONDYLOMA AND REMOVAL OF PERIANAL WART;  Surgeon: Florian Buff, MD;  Location: [facility];  Service: Gynecology;  Laterality: Bilateral;   MASS EXCISION Left 09/27/2017   Procedure: EXCISION LESION LEFT THIGH;  Surgeon: Florian Buff, MD;  Location: [facility];  Service: Gynecology;  Laterality: Left;   Social History   Social History Narrative   Grew up in [place], [place].   Engaged to be married.   5 children.    Eats mainly meat/junk food.   Immunization History  Administered Date(s) Administered   PFIZER Comirnaty(Gray Top)Covid-19 Tri-Sucrose Vaccine 08/26/2020   PFIZER(Purple Top)SARS-COV-2 Vaccination 12/12/2019, 01/02/2020     Objective: Vital Signs: LMP 07/29/2012    Physical Exam   Musculoskeletal Exam: ***  CDAI Exam: CDAI Score: -- Patient Global: --; Provider Global: -- Swollen: --; Tender: -- Joint Exam 11/09/2021   No joint exam has been documented for this visit   There is currently no information documented on the homunculus. Go to the Rheumatology activity and complete the homunculus joint exam.  Investigation: No additional findings.  Imaging: No results found.  Recent Labs: Lab Results  Component Value Date   WBC 4.4 07/20/2021   HGB 10.1 (L) 07/20/2021   PLT 435 (H) 07/20/2021   NA 140 07/20/2021   K 3.2 (L) 07/20/2021   CL 103 07/20/2021   CO2 31 07/20/2021   GLUCOSE 82 07/20/2021   BUN 7 07/20/2021   CREATININE 0.65 07/20/2021   BILITOT 0.5 07/20/2021   ALKPHOS 89 02/10/2021   AST 31  07/20/2021   ALT 15 07/20/2021   PROT 7.5 07/20/2021   ALBUMIN 3.5 (L) 02/10/2021   CALCIUM 8.7 07/20/2021   GFRAA 115 07/29/2020   QFTBGOLDPLUS NEGATIVE 06/19/2020    Speciality Comments: No specialty comments available.  Procedures:  No procedures performed Allergies: Lisinopril   Assessment / Plan:     Visit Diagnoses: No diagnosis found.  ***  Orders: No orders of the defined types were placed in this encounter.  No orders of the defined types were placed in this encounter.    Follow-Up Instructions: No follow-ups on file.   Earnestine Mealing, CMA  Note - This record has been created using Editor, commissioning.  Chart creation errors have been sought, but may not always  have been located. Such creation errors do not reflect on  the standard of medical care.

## 2021-11-09 ENCOUNTER — Ambulatory Visit: Payer: Medicaid Other | Admitting: Internal Medicine

## 2021-11-09 DIAGNOSIS — G8194 Hemiplegia, unspecified affecting left nondominant side: Secondary | ICD-10-CM

## 2021-11-09 DIAGNOSIS — Z79899 Other long term (current) drug therapy: Secondary | ICD-10-CM

## 2021-11-09 DIAGNOSIS — M059 Rheumatoid arthritis with rheumatoid factor, unspecified: Secondary | ICD-10-CM

## 2021-11-11 ENCOUNTER — Other Ambulatory Visit (HOSPITAL_COMMUNITY): Payer: Self-pay | Admitting: Physician Assistant

## 2021-11-11 DIAGNOSIS — Z1231 Encounter for screening mammogram for malignant neoplasm of breast: Secondary | ICD-10-CM

## 2021-11-15 ENCOUNTER — Encounter (HOSPITAL_COMMUNITY): Payer: Self-pay

## 2021-11-15 ENCOUNTER — Ambulatory Visit (HOSPITAL_COMMUNITY): Payer: Medicaid Other

## 2021-12-08 NOTE — Progress Notes (Signed)
Office Visit Note  Patient: [name]             Date of Birth: December 31, 1965           MRN: [id number]             PCP: Bernerd Limbo, MD Referring: Bernerd Limbo, MD Visit Date: 12/14/2021   Subjective:  Follow-up (Right hand joint pain, right knee joint pain. )   History of Present Illness: [name] is a 56 y.o. female here for follow up for seropositive RA on MTX 15 mg PO weekly and folic acid 1 mg daily. She continues to have swelling in her hands but not much pain unless tightly gripping or under pressure. Her right knee pain limits weightbearing severely and she has not been able to travel even 5-10 ft distances to the bathroom consistently. She is not sure whether this knee swells usually. 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  Previous HPI [name] is a 56 y.o. female here for follow up for seropositive RA on MTX 20 mg PO weekly and folic acid 1 mg daily. She took a course of oral prednisone after last visit in November but joint symptoms have been reasonably controlled on just methotrexate since then. We had discussed possible addition of Humira. She remains limited in mobility mostly due to left leg weakness and stiffness, using a cane at home.   Her husband is present at visit today assisting with transportation.    Previous HPI 02/10/21 [name] is a 56 y.o. female here for telehealth visit by video for follow up of her seropositive rheumatoid arthritis on methotrexate 20 mg PO weekly. She is experiencing increased joint pain and swelling for the past 2 to 3 weeks unable to tightly close her right hand and cannot bear weight enough to assist in transferring out of bed and wheelchair. She reports feeling better each week after taking methotrexate but quickly worsens again within a few days and this has been worsening. Otherwise no new side effect or health changes. We reviewed her previous clinic visit still had some  ongoing swelling and very high sedimentation rate tests but the pain and mobility is a lot worse.   Previous HPI 06/19/20 [name] is a 56 y.o. female with history of hypothyroidism, CVA with residual left hemiparesis, HTN, HLD here for evaluation of arthritis of multiple joints and positive rheumatoid factor. She has symptoms for some amount of time at least 6 months, swelling, and stiffness of joints on the right half of her body including elbow, wrist, fingers, and knee. She was evaluated in PCP clinic with highly positive inflammatory markers and RA antibodies with swollen joints noted on exam in 11/2019. She also had positive ANA and RNP antibodies. Xray of the right hand was checked and reportedly not show other cause of joint pain and no erosion. She has used some topical diclofenac without much improvement and tried tramadol also without much improvement. She has become unable to walk much due to severe pain bearing weight on her right knee and the left leg is too weak. Today she says symptoms are doing better compared to 2 months ago severity. She denies any complaints of hair loss, rashes, lymphadenopathy, oral ulcers, raynaud's.    Labs reviewed 11/2019 ESR 114 CRP 36 RF 421.6 CCP >250 ANA pos RNP >8.0 DsDNA, Sm, SSA, SSB neg Vit D 12.7     Review of Systems  Constitutional:  Negative for fatigue.  HENT:  Negative for mouth sores and mouth dryness.   Eyes:  Negative for dryness.  Respiratory:  Negative for shortness of breath.   Cardiovascular:  Negative for chest pain and palpitations.  Gastrointestinal:  Negative for blood in stool, constipation and diarrhea.  Endocrine: Negative for increased urination.  Genitourinary:  Negative for involuntary urination.  Musculoskeletal:  Positive for joint pain, gait problem, joint pain, joint swelling, myalgias, morning stiffness and myalgias. Negative for muscle weakness.  Skin:  Negative for color change, rash, hair loss and  sensitivity to sunlight.  Allergic/Immunologic: Negative for susceptible to infections.  Neurological:  Negative for dizziness and headaches.  Hematological:  Negative for swollen glands.  Psychiatric/Behavioral:  Negative for depressed mood and sleep disturbance. The patient is not nervous/anxious.     PMFS History:  Patient Active Problem List   Diagnosis Date Noted   Pain in right knee 12/14/2021   Normocytic anemia 12/02/2020   Seropositive rheumatoid arthritis ([place]) 06/19/2020   High risk medication use 06/19/2020   Vitamin D deficiency 06/19/2020   Anogenital (venereal) warts 08/04/2017   Screening for colorectal cancer 08/04/2017   Encounter for gynecological examination with Papanicolaou smear of cervix 08/04/2017   Vertigo 08/04/2017   Tobacco abuse 06/01/2017   Hypothyroidism 05/12/2017   Acute CVA (cerebrovascular accident) ([place]) 06/25/2013   Tremor of left hand 06/25/2013   Hemiparesis, left ([place]) 06/25/2013   Hyperlipidemia LDL goal <70    Difficulty in walking(719.7) 08/31/2011   Unstable balance 08/31/2011   Weakness of left side of body 08/31/2011   Cerebral infarction ([place]) 04/07/2011   Carotid artery occlusion 04/07/2011   Left hand weakness 04/07/2011   HTN (hypertension) 04/07/2011    Past Medical History:  Diagnosis Date   High cholesterol    Hypertension    Hypothyroidism    Stroke ([place])    weakness on left side.   Vaginal Pap smear, abnormal     Family History  Problem Relation Age of Onset   Hypertension Mother    Liver disease Mother    Heart attack Father    HIV Sister    Arrhythmia Sister    Mood Disorder Sister    Arrhythmia Brother    CAD Brother    Past Surgical History:  Procedure Laterality Date   DILATION AND CURETTAGE OF UTERUS     LASER ABLATION CONDOLAMATA Bilateral 09/27/2017   Procedure: LASER ABLATION CONDYLOMA AND REMOVAL OF PERIANAL WART;  Surgeon: Florian Buff, MD;  Location: [facility];  Service: Gynecology;  Laterality:  Bilateral;   MASS EXCISION Left 09/27/2017   Procedure: EXCISION LESION LEFT THIGH;  Surgeon: Florian Buff, MD;  Location: [facility];  Service: Gynecology;  Laterality: Left;   Social History   Social History Narrative   Grew up in [place], [place].   Engaged to be married.   5 children.    Eats mainly meat/junk food.   Immunization History  Administered Date(s) Administered   PFIZER Comirnaty(Gray Top)Covid-19 Tri-Sucrose Vaccine 08/26/2020   PFIZER(Purple Top)SARS-COV-2 Vaccination 12/12/2019, 01/02/2020     Objective: Vital Signs: BP (!) 149/85 (BP Location: Right Arm, Patient Position: Sitting, Cuff Size: Large)   Pulse 91   Resp 13   Ht 5' 6"  (1.676 m)   LMP 07/29/2012   BMI 45.19 kg/m    Physical Exam Constitutional:      Comments: In wheelchair  Cardiovascular:     Rate and Rhythm: Normal rate and regular rhythm.  Pulmonary:  Effort: Pulmonary effort is normal.     Breath sounds: Normal breath sounds.  Musculoskeletal:     Right lower leg: Edema present.     Left lower leg: Edema present.  Skin:    General: Skin is warm and dry.     Findings: No rash.  Neurological:     Mental Status: She is alert.     Comments: Left hemiplegia more severe in arm than leg  Psychiatric:        Mood and Affect: Mood normal.      Musculoskeletal Exam:  Shoulder range of motion restricted on both sides and unable to actively abduct the left shoulder Elbows full ROM no tenderness or swelling Wrists full ROM no tenderness or swelling Right hand MCP joints diffusely swollen with tenderness to pressure second and third PIP joints slightly decreased flexion range of motion, left hand with mild diffuse edema limited range of motion and decreased strength No paraspinal tenderness to palpation over upper and lower back Hip normal internal and external rotation without pain, no tenderness to lateral hip palpation Knees full ROM no tenderness or swelling    CDAI Exam: CDAI Score:  22  Patient Global: 40 mm; Provider Global: 50 mm Swollen: 6 ; Tender: 7  Joint Exam 12/14/2021      Right  Left  MCP 2  Swollen Tender     MCP 3  Swollen Tender     MCP 4  Swollen Tender     MCP 5  Swollen Tender     PIP 2  Swollen Tender     PIP 3  Swollen Tender     Knee   Tender        Investigation: No additional findings.  Imaging: No results found.  Recent Labs: Lab Results  Component Value Date   WBC 3.8 12/14/2021   HGB 10.4 (L) 12/14/2021   PLT 342 12/14/2021   NA 138 12/14/2021   K 3.6 12/14/2021   CL 106 12/14/2021   CO2 26 12/14/2021   GLUCOSE 81 12/14/2021   BUN 10 12/14/2021   CREATININE 0.60 12/14/2021   BILITOT 0.3 12/14/2021   ALKPHOS 89 02/10/2021   AST 60 (H) 12/14/2021   ALT 20 12/14/2021   PROT 8.3 (H) 12/14/2021   ALBUMIN 3.5 (L) 02/10/2021   CALCIUM 8.7 12/14/2021   GFRAA 115 07/29/2020   QFTBGOLDPLUS NEGATIVE 12/14/2021    Speciality Comments: No specialty comments available.  Procedures:  No procedures performed Allergies: Lisinopril   Assessment / Plan:     Visit Diagnoses: Seropositive rheumatoid arthritis ([place]) - Plan: Sedimentation rate, folic acid (FOLVITE) 1 MG tablet, methotrexate 2.5 MG tablet, predniSONE (DELTASONE) 10 MG tablet  Appears to have significant disease activity with synovitis in multiple joints more severe throughout the right side.  Not tolerant of increased methotrexate dose due to liver function test.  She does continue smoking which is a risk for increased disease activity.  Continue methotrexate 15 mg p.o. weekly folic acid 1 mg daily.  Plan to add Humira 40 mg subcu q. 14 days.  Due to high disease activity we will also probably recommend addition of low-dose prednisone temporarily while starting new treatment.  High risk medication use - methotrexate 15 mg p.o. weekly - Plan: CBC with Differential/Platelet, COMPLETE METABOLIC PANEL WITH GFR, QuantiFERON-TB Gold Plus  Checking CBC and CMP for methotrexate  medication monitoring.  Also checking QuantiFERON anticipating possible addition of TNF inhibitor treatment.  Chronic pain of right knee  Appears to be multifactorial with her rheumatoid arthritis also osteoarthritis and supporting disproportionate amount of body weight from the left hemiplegia.  Orders: Orders Placed This Encounter  Procedures   Sedimentation rate   CBC with Differential/Platelet   COMPLETE METABOLIC PANEL WITH GFR   QuantiFERON-TB Gold Plus   Meds ordered this encounter  Medications   folic acid (FOLVITE) 1 MG tablet    Sig: Take 1 tablet (1 mg total) by mouth daily.    Dispense:  90 tablet    Refill:  3   methotrexate 2.5 MG tablet    Sig: Take 6 tablets (15 mg total) by mouth once a week. Caution:Chemotherapy. Protect from light.    Dispense:  78 tablet    Refill:  0   predniSONE (DELTASONE) 10 MG tablet    Sig: Take 1 tablet (10 mg total) by mouth daily with breakfast.    Dispense:  30 tablet    Refill:  0     Follow-Up Instructions: Return in about 3 months (around 03/15/2022) for RA on MTX/?ADA start f/u 62mo.   CCollier Salina MD  Note - This record has been created using [employer]  Chart creation errors have been sought, but may not always  have been located. Such creation errors do not reflect on  the standard of medical care.

## 2021-12-14 ENCOUNTER — Ambulatory Visit: Payer: Medicaid Other | Attending: Internal Medicine | Admitting: Internal Medicine

## 2021-12-14 ENCOUNTER — Encounter: Payer: Self-pay | Admitting: Internal Medicine

## 2021-12-14 VITALS — BP 149/85 | HR 91 | Resp 13 | Ht 66.0 in

## 2021-12-14 DIAGNOSIS — Z79899 Other long term (current) drug therapy: Secondary | ICD-10-CM | POA: Diagnosis not present

## 2021-12-14 DIAGNOSIS — M059 Rheumatoid arthritis with rheumatoid factor, unspecified: Secondary | ICD-10-CM | POA: Diagnosis not present

## 2021-12-14 DIAGNOSIS — M25561 Pain in right knee: Secondary | ICD-10-CM | POA: Diagnosis not present

## 2021-12-14 DIAGNOSIS — G8929 Other chronic pain: Secondary | ICD-10-CM

## 2021-12-14 DIAGNOSIS — G8194 Hemiplegia, unspecified affecting left nondominant side: Secondary | ICD-10-CM

## 2021-12-14 MED ORDER — FOLIC ACID 1 MG PO TABS: 1.0000 mg | ORAL_TABLET | Freq: Every day | ORAL | 3 refills | Status: DC

## 2021-12-14 MED ORDER — METHOTREXATE SODIUM 2.5 MG PO TABS: 15.0000 mg | ORAL_TABLET | ORAL | 0 refills | Status: DC

## 2021-12-16 LAB — COMPLETE METABOLIC PANEL WITH GFR
AG Ratio: 0.6 (calc) — ABNORMAL LOW (ref 1.0–2.5)
ALT: 20 U/L (ref 6–29)
AST: 60 U/L — ABNORMAL HIGH (ref 10–35)
Albumin: 3.1 g/dL — ABNORMAL LOW (ref 3.6–5.1)
Alkaline phosphatase (APISO): 80 U/L (ref 37–153)
BUN: 10 mg/dL (ref 7–25)
CO2: 26 mmol/L (ref 20–32)
Calcium: 8.7 mg/dL (ref 8.6–10.4)
Chloride: 106 mmol/L (ref 98–110)
Creat: 0.6 mg/dL (ref 0.50–1.03)
Globulin: 5.2 g/dL (calc) — ABNORMAL HIGH (ref 1.9–3.7)
Glucose, Bld: 81 mg/dL (ref 65–99)
Potassium: 3.6 mmol/L (ref 3.5–5.3)
Sodium: 138 mmol/L (ref 135–146)
Total Bilirubin: 0.3 mg/dL (ref 0.2–1.2)
Total Protein: 8.3 g/dL — ABNORMAL HIGH (ref 6.1–8.1)
eGFR: 106 mL/min/{1.73_m2} (ref >=60)

## 2021-12-16 LAB — QUANTIFERON-TB GOLD PLUS
Mitogen-NIL: 2.14 IU/mL
NIL: 0.04 IU/mL
QuantiFERON-TB Gold Plus: NEGATIVE
TB1-NIL: <0 IU/mL
TB2-NIL: <0 IU/mL

## 2021-12-16 LAB — CBC WITH DIFFERENTIAL/PLATELET
Absolute Monocytes: 141 cells/uL — ABNORMAL LOW (ref 200–950)
Basophils Absolute: 19 cells/uL (ref 0–200)
Basophils Relative: 0.5 %
Eosinophils Absolute: 30 cells/uL (ref 15–500)
Eosinophils Relative: 0.8 %
HCT: 31.8 % — ABNORMAL LOW (ref 35.0–45.0)
Hemoglobin: 10.4 g/dL — ABNORMAL LOW (ref 11.7–15.5)
Lymphs Abs: 787 cells/uL — ABNORMAL LOW (ref 850–3900)
MCH: 27.2 pg (ref 27.0–33.0)
MCHC: 32.7 g/dL (ref 32.0–36.0)
MCV: 83.2 fL (ref 80.0–100.0)
MPV: 10.1 fL (ref 7.5–12.5)
Monocytes Relative: 3.7 %
Neutro Abs: 2823 cells/uL (ref 1500–7800)
Neutrophils Relative %: 74.3 %
Platelets: 342 10*3/uL (ref 140–400)
RBC: 3.82 10*6/uL (ref 3.80–5.10)
RDW: 15.2 % — ABNORMAL HIGH (ref 11.0–15.0)
Total Lymphocyte: 20.7 %
WBC: 3.8 10*3/uL (ref 3.8–10.8)

## 2021-12-16 LAB — SEDIMENTATION RATE: Sed Rate: 109 mm/h — ABNORMAL HIGH (ref 0–30)

## 2021-12-16 NOTE — Progress Notes (Signed)
Sedimentation rate is very high indicating a lot of active inflammation. I do recommend we start the Humira medication as planned. Does she still agree with that plan? Her liver function test is mildly abnormal, I don't think she needs to change her methotrexate unless we see a trend with this. If symptoms are very bad right now we can prescribe a steroid medicine for the short term until starting the new humira.

## 2021-12-17 MED ORDER — PREDNISONE 10 MG PO TABS: 10.0000 mg | ORAL_TABLET | Freq: Every day | ORAL | 0 refills | Status: DC

## 2021-12-17 NOTE — Progress Notes (Signed)
I sent a prescription for the prednisone 10 mg daily she can take in the next month or until starting Humira.

## 2021-12-20 ENCOUNTER — Telehealth: Payer: Self-pay | Admitting: Pharmacist

## 2021-12-20 NOTE — Telephone Encounter (Addendum)
Submitted a Prior Authorization request to  [facility]  for [place] via CoverMyMeds. Will update once we receive a response.  Key: [id number]  Knox Saliva, PharmD, MPH, BCPS, CPP Clinical Pharmacist (Rheumatology and Pulmonology)  ----- Message from Collier Salina, MD sent at 12/19/2021  1:33 PM EDT ----- Regarding: Humira Recommending starting humira for seropositive RA inadequate response on methotrexate. Baseline labs look okay LFTs mildly abnormal but these have been transiently elevated before. Thanks.

## 2021-12-21 ENCOUNTER — Other Ambulatory Visit (HOSPITAL_COMMUNITY): Payer: Self-pay

## 2021-12-21 NOTE — Telephone Encounter (Signed)
Received notification from  [facility]  regarding a prior authorization for HUMIRA. Authorization has been APPROVED from 12/20/21 to 12/19/21.   Per test claim, copay for 28 days supply is $4  Patient can fill through [place]: [phone]   Authorization # [id number]  Per patient's daughter, patient does not do well w mornings. Advised that I am in the clinic in the mornings only from Monday through Thursday. She will call back  Knox Saliva, PharmD, MPH, BCPS, CPP Clinical Pharmacist (Rheumatology and Pulmonology)

## 2021-12-23 NOTE — Telephone Encounter (Signed)
ATC patient's daughter regarding Humira new start visit. Unable to reach. Left VM requesting return call  Knox Saliva, PharmD, MPH, BCPS, CPP Clinical Pharmacist (Rheumatology and Pulmonology)

## 2021-12-27 NOTE — Telephone Encounter (Signed)
ATC #2 patient's daughter to schedule Humira new start. Unable to reach, so left VM. Advised of my availability on VM  Knox Saliva, PharmD, MPH, BCPS, CPP Clinical Pharmacist (Rheumatology and Pulmonology)

## 2022-01-04 NOTE — Progress Notes (Deleted)
Pharmacy Note  Subjective:   Patient presents to clinic today with her husband to receive first dose of HUMIRA for seropositive rheumatoid arthritis. Patient currently takes methotrexate 15mg  orally once weekly with folic acid 1mg  daily. She was unable to tolerate higher MTX dose.  Patient running a fever or have signs/symptoms of infection? {yes/no:20286}  Patient currently on antibiotics for the treatment of infection? {yes/no:20286}  Patient have any upcoming invasive procedures/surgeries? {yes/no:20286}  Objective: CMP     Component Value Date/Time   NA 138 12/14/2021 1529   NA 137 02/10/2021 1441   K 3.6 12/14/2021 1529   CL 106 12/14/2021 1529   CO2 26 12/14/2021 1529   GLUCOSE 81 12/14/2021 1529   BUN 10 12/14/2021 1529   BUN 12 02/10/2021 1441   CREATININE 0.60 12/14/2021 1529   CALCIUM 8.7 12/14/2021 1529   PROT 8.3 (H) 12/14/2021 1529   PROT 9.2 (H) 02/10/2021 1441   ALBUMIN 3.5 (L) 02/10/2021 1441   AST 60 (H) 12/14/2021 1529   ALT 20 12/14/2021 1529   ALKPHOS 89 02/10/2021 1441   BILITOT 0.3 12/14/2021 1529   BILITOT 0.4 02/10/2021 1441   GFRNONAA 100 07/29/2020 1511   GFRAA 115 07/29/2020 1511    CBC    Component Value Date/Time   WBC 3.8 12/14/2021 1529   RBC 3.82 12/14/2021 1529   HGB 10.4 (L) 12/14/2021 1529   HGB 10.8 (L) 02/10/2021 1441   HCT 31.8 (L) 12/14/2021 1529   HCT 32.0 (L) 02/10/2021 1441   PLT 342 12/14/2021 1529   PLT 561 (H) 02/10/2021 1441   MCV 83.2 12/14/2021 1529   MCV 83 02/10/2021 1441   MCH 27.2 12/14/2021 1529   MCHC 32.7 12/14/2021 1529   RDW 15.2 (H) 12/14/2021 1529   RDW 15.8 (H) 02/10/2021 1441   LYMPHSABS 787 (L) 12/14/2021 1529   LYMPHSABS 0.7 02/10/2021 1441   MONOABS 0.4 04/18/2014 1455   EOSABS 30 12/14/2021 1529   EOSABS 0.0 02/10/2021 1441   BASOSABS 19 12/14/2021 1529   BASOSABS 0.0 02/10/2021 1441    Baseline Immunosuppressant Therapy Labs TB GOLD    Latest Ref Rng & Units 12/14/2021    3:29 PM   Quantiferon TB Gold  Quantiferon TB Gold Plus NEGATIVE NEGATIVE    Hepatitis Panel    Latest Ref Rng & Units 06/19/2020   11:24 AM  Hepatitis  Hep B Surface Ag NON-REACTI REACTIVE   Hep B IgM NON-REACTI NON-REACTIVE   Hep C Ab NON-REACTI NON-REACTIVE   Hep A IgM NON-REACTI NON-REACTIVE    HIV Lab Results  Component Value Date   HIV NON REACTIVE 09/26/2017   HIV NON REACTIVE 06/25/2013   Immunoglobulins   SPEP    Latest Ref Rng & Units 12/14/2021    3:29 PM  Serum Protein Electrophoresis  Total Protein 6.1 - 8.1 g/dL 8.3    06/27/2013 No results found for: "G6PDH" TPMT No results found for: "TPMT"   Chest x-ray: 06/26/2013 - There is no evidence of active cardiopulmonary disease  Assessment/Plan:  Reviewed importance of holding HUMIRA and METHOTREXATE with signs/symptoms of an infections, if antibiotics are prescribed to treat an active infection, and with invasive procedures  Demonstrated proper injection technique with HUMIRA demo device  Patient able to demonstrate proper injection technique using the teach back method.  Patient self injected in the {injsitedsg:28167} with:  Sample Medication: Humira 40mg /0.4 mL autoinjector NDC: *** Lot: *** Expiration: ***  Patient tolerated well.  Observed for 30 mins in  office for adverse reaction and ***.   Patient is to return in 1 month for labs and 6-8 weeks for follow-up appointment.  Standing orders placed.   Humira approved through insurance .   Rx sent to: [facility]: [phone] .  Patient provided with pharmacy phone number and advised that [facility] will call later this week to schedule shipment to her home. Subsequent refill calls will be conducted by [facility] staff.  Patient will continue Humira 40mg  SQ every 14 days in combination with methotrexate 15mg  PO once weekly with folic acid 1mg  daily.  All questions encouraged and answered.  Instructed patient to call with any further questions  or concerns.  Knox Saliva, PharmD, MPH, BCPS, CPP Clinical Pharmacist (Rheumatology and Pulmonology)  01/04/2022 11:17 AM

## 2022-01-04 NOTE — Telephone Encounter (Signed)
Patient scheduled for Humira new start on 01/12/22. Will use sample  Knox Saliva, PharmD, MPH, BCPS, CPP Clinical Pharmacist (Rheumatology and Pulmonology)

## 2022-01-12 ENCOUNTER — Ambulatory Visit: Payer: Medicaid Other | Attending: Pharmacist | Admitting: Pharmacist

## 2022-01-12 NOTE — Telephone Encounter (Signed)
Patient no-show to Humira new start this morning. Will f/u tomorrow regarding r/s  Knox Saliva, PharmD, MPH, BCPS, CPP Clinical Pharmacist (Rheumatology and Pulmonology)

## 2022-01-13 NOTE — Telephone Encounter (Signed)
ATC patient to schedule Humira new start. Unable to reach. Left VM on daughter's phone  Knox Saliva, PharmD, MPH, BCPS, CPP Clinical Pharmacist (Rheumatology and Pulmonology)

## 2022-01-14 ENCOUNTER — Inpatient Hospital Stay (HOSPITAL_COMMUNITY)
Admission: EM | Admit: 2022-01-14 | Discharge: 2022-01-18 | DRG: 871 | Disposition: A | Discharge status: Home Health | Payer: Medicaid Other | Attending: Family Medicine | Admitting: Family Medicine

## 2022-01-14 ENCOUNTER — Encounter (HOSPITAL_COMMUNITY): Payer: Self-pay | Admitting: Emergency Medicine

## 2022-01-14 ENCOUNTER — Other Ambulatory Visit: Payer: Self-pay

## 2022-01-14 ENCOUNTER — Emergency Department (HOSPITAL_COMMUNITY): Payer: Medicaid Other

## 2022-01-14 DIAGNOSIS — F17210 Nicotine dependence, cigarettes, uncomplicated: Secondary | ICD-10-CM | POA: Diagnosis present

## 2022-01-14 DIAGNOSIS — M069 Rheumatoid arthritis, unspecified: Secondary | ICD-10-CM | POA: Diagnosis present

## 2022-01-14 DIAGNOSIS — J90 Pleural effusion, not elsewhere classified: Secondary | ICD-10-CM | POA: Diagnosis present

## 2022-01-14 DIAGNOSIS — Z79631 Long term (current) use of antimetabolite agent: Secondary | ICD-10-CM | POA: Diagnosis not present

## 2022-01-14 DIAGNOSIS — E871 Hypo-osmolality and hyponatremia: Secondary | ICD-10-CM

## 2022-01-14 DIAGNOSIS — E876 Hypokalemia: Secondary | ICD-10-CM

## 2022-01-14 DIAGNOSIS — E039 Hypothyroidism, unspecified: Secondary | ICD-10-CM | POA: Diagnosis present

## 2022-01-14 DIAGNOSIS — D84821 Immunodeficiency due to drugs: Secondary | ICD-10-CM | POA: Diagnosis present

## 2022-01-14 DIAGNOSIS — A419 Sepsis, unspecified organism: Secondary | ICD-10-CM | POA: Diagnosis present

## 2022-01-14 DIAGNOSIS — Z1152 Encounter for screening for COVID-19: Secondary | ICD-10-CM | POA: Diagnosis not present

## 2022-01-14 DIAGNOSIS — E7800 Pure hypercholesterolemia, unspecified: Secondary | ICD-10-CM | POA: Diagnosis present

## 2022-01-14 DIAGNOSIS — Z79899 Other long term (current) drug therapy: Secondary | ICD-10-CM

## 2022-01-14 DIAGNOSIS — Z8249 Family history of ischemic heart disease and other diseases of the circulatory system: Secondary | ICD-10-CM

## 2022-01-14 DIAGNOSIS — I2510 Atherosclerotic heart disease of native coronary artery without angina pectoris: Secondary | ICD-10-CM

## 2022-01-14 DIAGNOSIS — E669 Obesity, unspecified: Secondary | ICD-10-CM | POA: Diagnosis present

## 2022-01-14 DIAGNOSIS — J440 Chronic obstructive pulmonary disease with acute lower respiratory infection: Secondary | ICD-10-CM | POA: Diagnosis present

## 2022-01-14 DIAGNOSIS — I6930 Unspecified sequelae of cerebral infarction: Secondary | ICD-10-CM

## 2022-01-14 DIAGNOSIS — J9601 Acute respiratory failure with hypoxia: Secondary | ICD-10-CM

## 2022-01-14 DIAGNOSIS — I69354 Hemiplegia and hemiparesis following cerebral infarction affecting left non-dominant side: Secondary | ICD-10-CM

## 2022-01-14 DIAGNOSIS — Z7982 Long term (current) use of aspirin: Secondary | ICD-10-CM

## 2022-01-14 DIAGNOSIS — F17200 Nicotine dependence, unspecified, uncomplicated: Secondary | ICD-10-CM | POA: Diagnosis present

## 2022-01-14 DIAGNOSIS — J189 Pneumonia, unspecified organism: Secondary | ICD-10-CM | POA: Diagnosis present

## 2022-01-14 DIAGNOSIS — Z888 Allergy status to other drugs, medicaments and biological substances status: Secondary | ICD-10-CM | POA: Diagnosis not present

## 2022-01-14 DIAGNOSIS — I10 Essential (primary) hypertension: Secondary | ICD-10-CM

## 2022-01-14 DIAGNOSIS — Z6841 Body Mass Index (BMI) 40.0 and over, adult: Secondary | ICD-10-CM | POA: Diagnosis not present

## 2022-01-14 DIAGNOSIS — R6520 Severe sepsis without septic shock: Secondary | ICD-10-CM | POA: Diagnosis not present

## 2022-01-14 DIAGNOSIS — Z720 Tobacco use: Secondary | ICD-10-CM | POA: Diagnosis present

## 2022-01-14 LAB — CBC
HCT: 34.2 % — ABNORMAL LOW (ref 36.0–46.0)
Hemoglobin: 11 g/dL — ABNORMAL LOW (ref 12.0–15.0)
MCH: 27.2 pg (ref 26.0–34.0)
MCHC: 32.2 g/dL (ref 30.0–36.0)
MCV: 84.7 fL (ref 80.0–100.0)
Platelets: 399 10*3/uL (ref 150–400)
RBC: 4.04 MIL/uL (ref 3.87–5.11)
RDW: 16 % — ABNORMAL HIGH (ref 11.5–15.5)
WBC: 12 10*3/uL — ABNORMAL HIGH (ref 4.0–10.5)
nRBC: 0 % (ref 0.0–0.2)

## 2022-01-14 LAB — COMPREHENSIVE METABOLIC PANEL
ALT: 19 U/L (ref 0–44)
AST: 48 U/L — ABNORMAL HIGH (ref 15–41)
Albumin: 2.2 g/dL — ABNORMAL LOW (ref 3.5–5.0)
Alkaline Phosphatase: 51 U/L (ref 38–126)
Anion gap: 9 (ref 5–15)
BUN: 18 mg/dL (ref 6–20)
CO2: 25 mmol/L (ref 22–32)
Calcium: 8.1 mg/dL — ABNORMAL LOW (ref 8.9–10.3)
Chloride: 98 mmol/L (ref 98–111)
Creatinine, Ser: 0.84 mg/dL (ref 0.44–1.00)
GFR, Estimated: >60 mL/min (ref >60)
Glucose, Bld: 127 mg/dL — ABNORMAL HIGH (ref 70–99)
Potassium: 3.1 mmol/L — ABNORMAL LOW (ref 3.5–5.1)
Sodium: 132 mmol/L — ABNORMAL LOW (ref 135–145)
Total Bilirubin: 1.4 mg/dL — ABNORMAL HIGH (ref 0.3–1.2)
Total Protein: 7.7 g/dL (ref 6.5–8.1)

## 2022-01-14 LAB — APTT: aPTT: 34 seconds (ref 24–36)

## 2022-01-14 LAB — LACTIC ACID, PLASMA: Lactic Acid, Venous: 1.7 mmol/L (ref 0.5–1.9)

## 2022-01-14 LAB — LIPASE, BLOOD: Lipase: 37 U/L (ref 11–51)

## 2022-01-14 LAB — PROTIME-INR
INR: 1.4 — ABNORMAL HIGH (ref 0.8–1.2)
Prothrombin Time: 17.2 seconds — ABNORMAL HIGH (ref 11.4–15.2)

## 2022-01-14 LAB — RESP PANEL BY RT-PCR (FLU A&B, COVID) ARPGX2
Influenza A by PCR: NEGATIVE
Influenza B by PCR: NEGATIVE
SARS Coronavirus 2 by RT PCR: NEGATIVE

## 2022-01-14 MED ORDER — SODIUM CHLORIDE 0.9 % IV SOLN
2.0000 g | INTRAVENOUS | Status: DC
  Administered 2022-01-14 – 2022-01-17 (×3): 2 g via INTRAVENOUS
  Filled 2022-01-14 (×3): qty 20

## 2022-01-14 MED ORDER — LACTATED RINGERS IV SOLN: INTRAVENOUS | Status: DC

## 2022-01-14 MED ORDER — SODIUM CHLORIDE 0.9 % IV SOLN
500.0000 mg | INTRAVENOUS | Status: DC
  Administered 2022-01-14 – 2022-01-15 (×2): 500 mg via INTRAVENOUS
  Filled 2022-01-14 (×2): qty 5

## 2022-01-14 MED ORDER — IOHEXOL 350 MG/ML SOLN
100.0000 mL | Freq: Once | INTRAVENOUS | Status: AC | PRN
  Administered 2022-01-14: 100 mL via INTRAVENOUS

## 2022-01-14 NOTE — ED Notes (Signed)
Blood culture set drawn by this nurse

## 2022-01-14 NOTE — ED Notes (Signed)
pt daughter requesting that pt go to [facility] "due to the lack of care [facility] can't provide" says we don't have a pulmonologist here- Dr Sabra Heck made aware- ok for pt to have beverage per Dr Sabra Heck

## 2022-01-14 NOTE — ED Notes (Addendum)
Pt transported to CT - family (daughter and spouse at bedside) pt daughter fussing about the floor not being clean, pt daughter has mask on, pt and pt spouse do not have mask on at this time. Pt daughter asking if the Covid swab tests for the new strain of covid- informed that this is the covid swab we have used since covid started and has worked on all the strains so far, that is all the information I have to give at this time regarding the covid test.

## 2022-01-14 NOTE — ED Triage Notes (Signed)
Pt bib EMS for c/o N/V/Weakness x 2 days. Pt with hx CVA and L sided weakness.

## 2022-01-14 NOTE — ED Provider Notes (Signed)
[facility] EMERGENCY DEPARTMENT Provider Note   CSN: [id number] Arrival date & time: 01/14/22  2129     History  Chief Complaint  Patient presents with   N/V/Weakness     [name] is a 56 y.o. female.  HPI This patient is a 56 year old female, she has a known history of hypertension on amlodipine, she also takes a statin for cholesterol, methotrexate and levothyroxine.  She presents to the hospital with chest pain which has been going on for couple of days with associated shortness of breath, she has not had any nausea or vomiting today, she has not had any significant swelling of her legs and denies any fevers chills coughing or phlegm.  She has never had anything like this, denies a history of heart attack, she has had a history of 3 different strokes.  As far as anticoagulants all she takes is an aspirin.  Review of the medical record shows that the patient last underwent an MRI of the brain back in 2015, since that time has not had any significant work-up, has been seen in her family doctor multiple times being cared for for primary hypertension as well as rheumatic arthritis.  She is known to have hemiparesis on the left side.    Home Medications Prior to Admission medications   Medication Sig Start Date End Date Taking? Authorizing Provider  amLODipine (NORVASC) 10 MG tablet Take 10 mg by mouth daily.    [provider]  aspirin EC 325 MG tablet Take 1 tablet (325 mg total) by mouth daily. 08/24/17   Caren Macadam, MD  folic acid (FOLVITE) 1 MG tablet Take 1 tablet (1 mg total) by mouth daily. 12/14/21   Rice, Resa Miner, MD  levothyroxine (SYNTHROID, LEVOTHROID) 50 MCG tablet Take 1 tablet (50 mcg total) by mouth daily. Patient not taking: Reported on 12/14/2021 06/01/17   Caren Macadam, MD  meclizine (ANTIVERT) 25 MG tablet Take 1 tablet (25 mg total) by mouth 3 (three) times daily as needed for dizziness. Patient not taking: Reported on 02/10/2021 08/04/17    Derrek Monaco A, NP  methotrexate 2.5 MG tablet Take 6 tablets (15 mg total) by mouth once a week. Caution:Chemotherapy. Protect from light. 12/14/21   Rice, Resa Miner, MD  metroNIDAZOLE (FLAGYL) 500 MG tablet Take 1 tablet (500 mg total) by mouth 2 (two) times daily. Patient not taking: Reported on 12/14/2021 09/03/21   Estill Dooms, NP  Misc. Devices (CANE) MISC 1 each by Does not apply route as directed. Patient not taking: Reported on 08/31/2021 08/04/17   Caren Macadam, MD  Multiple Vitamin (MULTIVITAMIN WITH MINERALS) TABS tablet Take 1 tablet by mouth daily.    [provider]  Na Sulfate-K Sulfate-Mg Sulf (SUPREP BOWEL PREP KIT) 17.5-3.13-1.6 GM/177ML SOLN Take 1 kit by mouth as directed. Patient not taking: Reported on 12/23/2020 12/02/20   Zehr, Laban Emperor, PA-C  pravastatin (PRAVACHOL) 40 MG tablet Take 1 tablet (40 mg total) by mouth daily. 06/28/13   Lucia Gaskins, MD  predniSONE (DELTASONE) 10 MG tablet Take 1 tablet (10 mg total) by mouth daily with breakfast. 12/17/21 01/16/22  Collier Salina, MD  silver sulfADIAZINE (SILVADENE) 1 % cream Apply 3-4 times daily to wound Patient not taking: Reported on 08/31/2021 09/27/17   Florian Buff, MD  traZODone (DESYREL) 50 MG tablet Take by mouth. Patient not taking: Reported on 12/14/2021 07/07/21   [provider]  varenicline (CHANTIX) 1 MG tablet Take 1 mg by mouth 2 (  two) times daily. Patient not taking: Reported on 07/20/2021    [provider]      Allergies    Lisinopril    Review of Systems   Review of Systems  All other systems reviewed and are negative.   Physical Exam Updated Vital Signs BP 113/74   Pulse (!) 122   Temp 98.5 F (36.9 C) (Oral)   Resp (!) 35 Comment: pt not in respiratory distress, pt does have rapid shallow breathing- EDP is aware.  Ht 1.676 m (_0 )   Wt 127 kg   LMP 07/29/2012   SpO2 93%   BMI 45.19 kg/m  Physical Exam Vitals and nursing note reviewed.   Constitutional:      General: She is in acute distress.     Appearance: She is well-developed. She is ill-appearing.  HENT:     Head: Normocephalic and atraumatic.     Nose: Nose normal.     Mouth/Throat:     Mouth: Mucous membranes are moist.     Pharynx: No oropharyngeal exudate.  Eyes:     General: No scleral icterus.       Right eye: No discharge.        Left eye: No discharge.     Conjunctiva/sclera: Conjunctivae normal.     Pupils: Pupils are equal, round, and reactive to light.  Neck:     Thyroid: No thyromegaly.     Vascular: No JVD.  Cardiovascular:     Rate and Rhythm: Regular rhythm. Tachycardia present.     Heart sounds: Normal heart sounds. No murmur heard.    No friction rub. No gallop.  Pulmonary:     Effort: Respiratory distress present.     Breath sounds: Rales present. No wheezing.  Abdominal:     General: Bowel sounds are normal. There is no distension.     Palpations: Abdomen is soft. There is no mass.     Tenderness: There is no abdominal tenderness.  Musculoskeletal:        General: No tenderness. Normal range of motion.     Cervical back: Normal range of motion and neck supple.     Right lower leg: No edema.     Left lower leg: No edema.  Lymphadenopathy:     Cervical: No cervical adenopathy.  Skin:    General: Skin is warm and dry.     Findings: No erythema or rash.  Neurological:     Mental Status: She is alert.     Coordination: Coordination normal.     Comments: Weakness on left, no difficulty with speech  Psychiatric:        Behavior: Behavior normal.     ED Results / Procedures / Treatments   Labs (all labs ordered are listed, but only abnormal results are displayed) Labs Reviewed  CBC - Abnormal; Notable for the following components:      Result Value   WBC 12.0 (*)    Hemoglobin 11.0 (*)    HCT 34.2 (*)    RDW 16.0 (*)    All other components within normal limits  PROTIME-INR - Abnormal; Notable for the following components:    Prothrombin Time 17.2 (*)    INR 1.4 (*)    All other components within normal limits  CULTURE, BLOOD (ROUTINE X 2)  CULTURE, BLOOD (ROUTINE X 2)  RESP PANEL BY RT-PCR (FLU A&B, COVID) ARPGX2  LACTIC ACID, PLASMA  APTT  URINALYSIS, ROUTINE W REFLEX MICROSCOPIC  LACTIC ACID, PLASMA  COMPREHENSIVE METABOLIC PANEL  LIPASE, BLOOD    EKG EKG Interpretation  Date/Time:  Friday January 14 2022 21:42:32 EDT Ventricular Rate:  132 PR Interval:  141 QRS Duration: 86 QT Interval:  305 QTC Calculation: 452 R Axis:   65 Text Interpretation: Sinus tachycardia ST elevation, consider inferior injury Since last tracing st abnormality now presen Since last tracing rate faster Confirmed by Noemi Chapel [phone]) on 01/14/2022 9:55:41 PM  Radiology CT Angio Chest PE W and/or Wo Contrast  Result Date: 01/14/2022 CLINICAL DATA:  This for 2 days with shortness of breath, initial encounter EXAM: CT ANGIOGRAPHY CHEST WITH CONTRAST TECHNIQUE: Multidetector CT imaging of the chest was performed using the standard protocol during bolus administration of intravenous contrast. Multiplanar CT image reconstructions and MIPs were obtained to evaluate the vascular anatomy. RADIATION DOSE REDUCTION: This exam was performed according to the departmental dose-optimization program which includes automated exposure control, adjustment of the mA and/or kV according to patient size and/or use of iterative reconstruction technique. CONTRAST:  192m OMNIPAQUE IOHEXOL 350 MG/ML SOLN COMPARISON:  Chest x-ray from earlier in the same day. FINDINGS: Cardiovascular: Thoracic aorta demonstrates atherosclerotic calcifications without aneurysmal dilatation or dissection. Mild cardiac enlargement is seen. Small pericardial effusion is noted measuring 1 cm in its greatest thickness. Coronary calcifications are noted. The pulmonary artery shows a normal branching pattern bilaterally. No filling defect to suggest pulmonary embolism is  noted. Mediastinum/Nodes: Thoracic inlet is within normal limits. No sizable hilar or mediastinal adenopathy is noted. The esophagus as visualized is within normal limits. Lungs/Pleura: Lungs show bilateral pleural effusions right slightly greater than left with lower lobe consolidation identified. No sizable parenchymal nodule is noted. Upper Abdomen: Visualized upper abdomen demonstrates mild fatty infiltration of the liver. Musculoskeletal: Degenerative change of the thoracic spine is seen. No acute rib abnormality is noted. Review of the MIP images confirms the above findings. IMPRESSION: No evidence of pulmonary emboli. Bilateral lower lobe consolidation with associated effusions left greater than right. Fatty liver. Mild pericardial effusion of unknown chronicity. Aortic Atherosclerosis (ICD10-I70.0). Electronically Signed   By: MInez CatalinaM.D.   On: 01/14/2022 23:03   DG Chest Portable 1 View  Result Date: 01/14/2022 CLINICAL DATA:  Shortness of breath, tachycardia.  History of CVA. EXAM: PORTABLE CHEST 1 VIEW COMPARISON:  Chest radiograph dated June 26, 2013 FINDINGS: The heart is enlarged. Atherosclerotic calcification of the aortic arch. Right lung is clear. Left basilar atelectasis or infiltrate. No acute osseous abnormality. IMPRESSION: 1. Cardiomegaly. 2. Left basilar atelectasis or infiltrate. Electronically Signed   By: IKeane PoliceD.O.   On: 01/14/2022 22:19    Procedures .Critical Care  Performed by: MNoemi Chapel MD Authorized by: MNoemi Chapel MD   Critical care provider statement:    Critical care time (minutes):  30   Critical care time was exclusive of:  Separately billable procedures and treating other patients and teaching time   Critical care was necessary to treat or prevent imminent or life-threatening deterioration of the following conditions:  Respiratory failure and sepsis   Critical care was time spent personally by me on the following activities:  Development of  treatment plan with patient or surrogate, discussions with consultants, evaluation of patient's response to treatment, examination of patient, ordering and review of laboratory studies, ordering and review of radiographic studies, ordering and performing treatments and interventions, pulse oximetry, re-evaluation of patient's condition, review of old charts and obtaining history from patient or surrogate   I assumed direction of critical  care for this patient from another provider in my specialty: no     Care discussed with: admitting provider   Comments:           Medications Ordered in ED Medications  lactated ringers infusion ( Intravenous New Bag/Given 01/14/22 2240)  cefTRIAXone (ROCEPHIN) 2 g in sodium chloride 0.9 % 100 mL IVPB (0 g Intravenous Stopped 01/14/22 2308)  azithromycin (ZITHROMAX) 500 mg in sodium chloride 0.9 % 250 mL IVPB (500 mg Intravenous New Bag/Given 01/14/22 2329)  iohexol (OMNIPAQUE) 350 MG/ML injection 100 mL (100 mLs Intravenous Contrast Given 01/14/22 2242)    ED Course/ Medical Decision Making/ A&P                           Medical Decision Making Amount and/or Complexity of Data Reviewed Labs: ordered. Radiology: ordered.  Risk Prescription drug management. Decision regarding hospitalization.   This patient presents to the ED for concern of shortness of breath with chest pain, this involves an extensive number of treatment options, and is a complaint that carries with it a high risk of complications and morbidity.  The differential diagnosis includes ischemia, pulmonary embolism, pneumonia, effusion, sepsis   Co morbidities that complicate the patient evaluation  Hypertension, prior stroke   Additional history obtained:  Additional history obtained from electronic medical record External records from outside source obtained and reviewed including history above, patient has been seen in the multiple visits in the office, followed for rheumatoid  arthritis and hypertension   Lab Tests:  I Ordered, and personally interpreted labs.  The pertinent results include: CBC showing a leukocytosis, INR is 1.4, hemoglobin is 11.  Blood cultures drawn   Imaging Studies ordered:  I ordered imaging studies including CT angiogram of the chest I independently visualized and interpreted imaging which showed no acute pulmonary embolism, mild pericardial effusion, bilateral small pleural effusions and bibasilar pneumonia bilaterally I agree with the radiologist interpretation   Cardiac Monitoring: / EKG:  The patient was maintained on a cardiac monitor.  I personally viewed and interpreted the cardiac monitored which showed an underlying rhythm of: Sinus tachycardia, started at 130, has reduced to 120 with some IV fluids and antibiotics.   Consultations Obtained:  I requested consultation with the hospitalist,  and discussed lab and imaging findings as well as pertinent plan - they recommend: Admission   Problem List / ED Course / Critical interventions / Medication management  This patient is ill with acute hypoxic respiratory failure, she has an tachycardia with a leukocytosis and the source of infection consistent with sepsis.  Lactic acid pending at this time. I ordered medication including Rocephin and Zithromax as well as some IV fluids and supplemental oxygen for acute hypoxic respiratory failure secondary to bilateral pneumonia and sepsis Reevaluation of the patient after these medicines showed that the patient critically ill I have reviewed the patients home medicines and have made adjustments as needed   Social Determinants of Health:  Continues to smoke Lung disease Prior stroke   Test / Admission - Considered:  We will admit to the hospital high level of care         Final Clinical Impression(s) / ED Diagnoses Final diagnoses:  Sepsis, due to unspecified organism, unspecified whether acute organ dysfunction  present ([place])  Pneumonia of both lower lobes due to infectious organism    Rx / DC Orders ED Discharge Orders     None  Noemi Chapel, MD 01/14/22 2330

## 2022-01-15 DIAGNOSIS — I2510 Atherosclerotic heart disease of native coronary artery without angina pectoris: Secondary | ICD-10-CM

## 2022-01-15 DIAGNOSIS — I10 Essential (primary) hypertension: Secondary | ICD-10-CM | POA: Diagnosis not present

## 2022-01-15 DIAGNOSIS — E876 Hypokalemia: Secondary | ICD-10-CM

## 2022-01-15 DIAGNOSIS — R6520 Severe sepsis without septic shock: Secondary | ICD-10-CM

## 2022-01-15 DIAGNOSIS — F17200 Nicotine dependence, unspecified, uncomplicated: Secondary | ICD-10-CM

## 2022-01-15 DIAGNOSIS — J189 Pneumonia, unspecified organism: Secondary | ICD-10-CM | POA: Diagnosis not present

## 2022-01-15 DIAGNOSIS — I6930 Unspecified sequelae of cerebral infarction: Secondary | ICD-10-CM

## 2022-01-15 DIAGNOSIS — J9601 Acute respiratory failure with hypoxia: Secondary | ICD-10-CM | POA: Diagnosis not present

## 2022-01-15 DIAGNOSIS — E871 Hypo-osmolality and hyponatremia: Secondary | ICD-10-CM

## 2022-01-15 DIAGNOSIS — A419 Sepsis, unspecified organism: Secondary | ICD-10-CM | POA: Diagnosis not present

## 2022-01-15 LAB — URINALYSIS, ROUTINE W REFLEX MICROSCOPIC
Glucose, UA: NEGATIVE mg/dL
Ketones, ur: NEGATIVE mg/dL
Leukocytes,Ua: NEGATIVE
Nitrite: NEGATIVE
Protein, ur: 100 mg/dL — AB
Specific Gravity, Urine: 1.01 (ref 1.005–1.030)
pH: 5.5 (ref 5.0–8.0)

## 2022-01-15 LAB — CBC WITH DIFFERENTIAL/PLATELET
Abs Immature Granulocytes: 0.06 10*3/uL (ref 0.00–0.07)
Basophils Absolute: 0 10*3/uL (ref 0.0–0.1)
Basophils Relative: 0 %
Eosinophils Absolute: 0 10*3/uL (ref 0.0–0.5)
Eosinophils Relative: 0 %
HCT: 31.4 % — ABNORMAL LOW (ref 36.0–46.0)
Hemoglobin: 9.9 g/dL — ABNORMAL LOW (ref 12.0–15.0)
Immature Granulocytes: 1 %
Lymphocytes Relative: 12 %
Lymphs Abs: 1.1 10*3/uL (ref 0.7–4.0)
MCH: 27.2 pg (ref 26.0–34.0)
MCHC: 31.5 g/dL (ref 30.0–36.0)
MCV: 86.3 fL (ref 80.0–100.0)
Monocytes Absolute: 0.2 10*3/uL (ref 0.1–1.0)
Monocytes Relative: 2 %
Neutro Abs: 8.1 10*3/uL — ABNORMAL HIGH (ref 1.7–7.7)
Neutrophils Relative %: 85 %
Platelets: 339 10*3/uL (ref 150–400)
RBC: 3.64 MIL/uL — ABNORMAL LOW (ref 3.87–5.11)
RDW: 15.9 % — ABNORMAL HIGH (ref 11.5–15.5)
WBC: 9.5 10*3/uL (ref 4.0–10.5)
nRBC: 0 % (ref 0.0–0.2)

## 2022-01-15 LAB — PROCALCITONIN: Procalcitonin: 0.77 ng/mL

## 2022-01-15 LAB — MAGNESIUM: Magnesium: 2.2 mg/dL (ref 1.7–2.4)

## 2022-01-15 LAB — URINALYSIS, MICROSCOPIC (REFLEX): Bacteria, UA: NONE SEEN

## 2022-01-15 LAB — COMPREHENSIVE METABOLIC PANEL
ALT: 20 U/L (ref 0–44)
AST: 48 U/L — ABNORMAL HIGH (ref 15–41)
Albumin: 2.2 g/dL — ABNORMAL LOW (ref 3.5–5.0)
Alkaline Phosphatase: 52 U/L (ref 38–126)
Anion gap: 8 (ref 5–15)
BUN: 16 mg/dL (ref 6–20)
CO2: 26 mmol/L (ref 22–32)
Calcium: 8.2 mg/dL — ABNORMAL LOW (ref 8.9–10.3)
Chloride: 100 mmol/L (ref 98–111)
Creatinine, Ser: 0.7 mg/dL (ref 0.44–1.00)
GFR, Estimated: >60 mL/min (ref >60)
Glucose, Bld: 112 mg/dL — ABNORMAL HIGH (ref 70–99)
Potassium: 3.2 mmol/L — ABNORMAL LOW (ref 3.5–5.1)
Sodium: 134 mmol/L — ABNORMAL LOW (ref 135–145)
Total Bilirubin: 1.1 mg/dL (ref 0.3–1.2)
Total Protein: 7.7 g/dL (ref 6.5–8.1)

## 2022-01-15 LAB — TSH: TSH: 2.542 u[IU]/mL (ref 0.350–4.500)

## 2022-01-15 LAB — MRSA NEXT GEN BY PCR, NASAL: MRSA by PCR Next Gen: NOT DETECTED

## 2022-01-15 LAB — STREP PNEUMONIAE URINARY ANTIGEN: Strep Pneumo Urinary Antigen: NEGATIVE

## 2022-01-15 LAB — LACTIC ACID, PLASMA: Lactic Acid, Venous: 1.4 mmol/L (ref 0.5–1.9)

## 2022-01-15 LAB — HIV ANTIBODY (ROUTINE TESTING W REFLEX): HIV Screen 4th Generation wRfx: NONREACTIVE

## 2022-01-15 MED ORDER — IPRATROPIUM BROMIDE 0.02 % IN SOLN
0.5000 mg | Freq: Four times a day (QID) | RESPIRATORY_TRACT | Status: DC
  Administered 2022-01-15 (×3): 0.5 mg via RESPIRATORY_TRACT
  Filled 2022-01-15 (×3): qty 2.5

## 2022-01-15 MED ORDER — ATORVASTATIN CALCIUM 40 MG PO TABS
80.0000 mg | ORAL_TABLET | Freq: Every day | ORAL | Status: DC
  Administered 2022-01-16 – 2022-01-18 (×3): 80 mg via ORAL
  Filled 2022-01-15 (×3): qty 2

## 2022-01-15 MED ORDER — PRAVASTATIN SODIUM 40 MG PO TABS
40.0000 mg | ORAL_TABLET | Freq: Every day | ORAL | Status: DC
  Administered 2022-01-15: 40 mg via ORAL
  Filled 2022-01-15: qty 1

## 2022-01-15 MED ORDER — POTASSIUM CHLORIDE IN NACL 20-0.9 MEQ/L-% IV SOLN: INTRAVENOUS | Status: AC

## 2022-01-15 MED ORDER — ALBUTEROL SULFATE (2.5 MG/3ML) 0.083% IN NEBU: 2.5000 mg | INHALATION_SOLUTION | RESPIRATORY_TRACT | Status: DC | PRN

## 2022-01-15 MED ORDER — METHYLPREDNISOLONE SODIUM SUCC 125 MG IJ SOLR: 125.0000 mg | Freq: Every day | INTRAMUSCULAR | Status: DC

## 2022-01-15 MED ORDER — CHLORHEXIDINE GLUCONATE CLOTH 2 % EX PADS
6.0000 | MEDICATED_PAD | Freq: Every day | CUTANEOUS | Status: DC
  Administered 2022-01-15 – 2022-01-18 (×3): 6 via TOPICAL

## 2022-01-15 MED ORDER — AMLODIPINE BESYLATE 5 MG PO TABS
10.0000 mg | ORAL_TABLET | Freq: Every day | ORAL | Status: DC
  Administered 2022-01-15 – 2022-01-18 (×4): 10 mg via ORAL
  Filled 2022-01-15 (×4): qty 2

## 2022-01-15 MED ORDER — HEPARIN SODIUM (PORCINE) 5000 UNIT/ML IJ SOLN
5000.0000 [IU] | Freq: Three times a day (TID) | INTRAMUSCULAR | Status: DC
  Administered 2022-01-15 – 2022-01-16 (×4): 5000 [IU] via SUBCUTANEOUS
  Filled 2022-01-15 (×4): qty 1

## 2022-01-15 MED ORDER — POTASSIUM CHLORIDE 20 MEQ PO PACK
40.0000 meq | PACK | Freq: Once | ORAL | Status: AC
  Administered 2022-01-15: 40 meq via ORAL
  Filled 2022-01-15: qty 2

## 2022-01-15 MED ORDER — GUAIFENESIN-DM 100-10 MG/5ML PO SYRP
10.0000 mL | ORAL_SOLUTION | Freq: Three times a day (TID) | ORAL | Status: DC
  Administered 2022-01-15 – 2022-01-18 (×10): 10 mL via ORAL
  Filled 2022-01-15 (×9): qty 10

## 2022-01-15 MED ORDER — SODIUM CHLORIDE 0.9 % IV SOLN: INTRAVENOUS | Status: DC

## 2022-01-15 MED ORDER — NICOTINE 14 MG/24HR TD PT24
14.0000 mg | MEDICATED_PATCH | Freq: Every day | TRANSDERMAL | Status: DC
  Administered 2022-01-15 – 2022-01-18 (×4): 14 mg via TRANSDERMAL
  Filled 2022-01-15 (×4): qty 1

## 2022-01-15 MED ORDER — RISAQUAD PO CAPS
1.0000 | ORAL_CAPSULE | Freq: Three times a day (TID) | ORAL | Status: DC
  Administered 2022-01-16 – 2022-01-18 (×7): 1 via ORAL
  Filled 2022-01-15 (×8): qty 1

## 2022-01-15 MED ORDER — IPRATROPIUM-ALBUTEROL 0.5-2.5 (3) MG/3ML IN SOLN
3.0000 mL | Freq: Four times a day (QID) | RESPIRATORY_TRACT | Status: DC | PRN
  Administered 2022-01-17: 3 mL via RESPIRATORY_TRACT
  Filled 2022-01-15: qty 3

## 2022-01-15 MED ORDER — METHYLPREDNISOLONE SODIUM SUCC 40 MG IJ SOLR
40.0000 mg | Freq: Two times a day (BID) | INTRAMUSCULAR | Status: DC
  Administered 2022-01-15 – 2022-01-17 (×5): 40 mg via INTRAVENOUS
  Filled 2022-01-15 (×5): qty 1

## 2022-01-15 MED ORDER — LEVALBUTEROL HCL 0.63 MG/3ML IN NEBU
0.6300 mg | INHALATION_SOLUTION | Freq: Four times a day (QID) | RESPIRATORY_TRACT | Status: DC
  Administered 2022-01-15 (×3): 0.63 mg via RESPIRATORY_TRACT
  Filled 2022-01-15 (×3): qty 3

## 2022-01-15 MED ORDER — ASPIRIN 325 MG PO TBEC
325.0000 mg | DELAYED_RELEASE_TABLET | Freq: Every day | ORAL | Status: DC
  Administered 2022-01-15 – 2022-01-18 (×4): 325 mg via ORAL
  Filled 2022-01-15 (×4): qty 1

## 2022-01-15 NOTE — ED Notes (Signed)
Pt given sandwich lunch bag and beverage

## 2022-01-15 NOTE — H&P (Signed)
History and Physical    Patient: [name] UPJ:[id number] DOB: March 15, 1966 DOA: 01/14/2022 DOS: the patient was seen and examined on 01/15/2022 PCP: Bernerd Limbo, MD  Patient coming from: Home  Chief Complaint:  Chief Complaint  Patient presents with   N/V/Weakness    HPI: [name] is a 56 y.o. female with medical history significant of hyperlipidemia, hypertension, acquired hypothyroidism reportedly no longer on levothyroxine, stroke, tobacco use disorder, and more presents the ED with a chief complaint of dyspnea.  Patient reports that she has had dyspnea that started 2 days ago.  Been progressively worsening.  She denies cough or fever.  She has had chest pain when she is laying flat.  The chest pain is located on her left side and feels like a pressure when she is laying flat.  She does have a left greater than right pleural effusion.  Patient reports that she has been wheezing.  She has no nebulizer and no inhaler at home.  She has not tried anything to improve her breathing.  She reports that her dyspnea is worse on exertion but present at rest as well.  Patient wears no oxygen at baseline.  Patient reports no diagnosis of COPD.  She does smoke half a pack per day.  She request nicotine patch.  She has had Chantix at home but has not started it yet.  She has been advised on the importance of smoking cessation.  Patient has no other complaints at this time.  Patient smokes as discussed above.  She does not drink alcohol.  She does use marijuana and her last use was 1 week ago.  She is vaccinated for COVID.  Patient is full code. Review of Systems: As mentioned in the history of present illness. All other systems reviewed and are negative. Past Medical History:  Diagnosis Date   High cholesterol    Hypertension    Hypothyroidism    Stroke ([place])    weakness on left side.   Vaginal Pap smear, abnormal    Past Surgical History:  Procedure Laterality Date   DILATION AND CURETTAGE  OF UTERUS     LASER ABLATION CONDOLAMATA Bilateral 09/27/2017   Procedure: LASER ABLATION CONDYLOMA AND REMOVAL OF PERIANAL WART;  Surgeon: Florian Buff, MD;  Location: [facility];  Service: Gynecology;  Laterality: Bilateral;   MASS EXCISION Left 09/27/2017   Procedure: EXCISION LESION LEFT THIGH;  Surgeon: Florian Buff, MD;  Location: [facility];  Service: Gynecology;  Laterality: Left;   Social History:  reports that she has been smoking cigarettes. She has a 16.00 pack-year smoking history. She has never used smokeless tobacco. She reports current drug use. Drug: Marijuana. She reports that she does not drink alcohol.  Allergies  Allergen Reactions   Lisinopril Nausea Only    Family History  Problem Relation Age of Onset   Hypertension Mother    Liver disease Mother    Heart attack Father    HIV Sister    Arrhythmia Sister    Mood Disorder Sister    Arrhythmia Brother    CAD Brother     Prior to Admission medications   Medication Sig Start Date End Date Taking? Authorizing Provider  amLODipine (NORVASC) 10 MG tablet Take 10 mg by mouth daily.    [provider]  aspirin EC 325 MG tablet Take 1 tablet (325 mg total) by mouth daily. 08/24/17   Caren Macadam, MD  folic acid (FOLVITE) 1 MG tablet Take 1 tablet (1  mg total) by mouth daily. 12/14/21   Rice, Resa Miner, MD  levothyroxine (SYNTHROID, LEVOTHROID) 50 MCG tablet Take 1 tablet (50 mcg total) by mouth daily. Patient not taking: Reported on 12/14/2021 06/01/17   Caren Macadam, MD  meclizine (ANTIVERT) 25 MG tablet Take 1 tablet (25 mg total) by mouth 3 (three) times daily as needed for dizziness. Patient not taking: Reported on 02/10/2021 08/04/17   Derrek Monaco A, NP  methotrexate 2.5 MG tablet Take 6 tablets (15 mg total) by mouth once a week. Caution:Chemotherapy. Protect from light. 12/14/21   Rice, Resa Miner, MD  metroNIDAZOLE (FLAGYL) 500 MG tablet Take 1 tablet (500 mg total) by mouth 2 (two) times  daily. Patient not taking: Reported on 12/14/2021 09/03/21   Estill Dooms, NP  Misc. Devices (CANE) MISC 1 each by Does not apply route as directed. Patient not taking: Reported on 08/31/2021 08/04/17   Caren Macadam, MD  Multiple Vitamin (MULTIVITAMIN WITH MINERALS) TABS tablet Take 1 tablet by mouth daily.    [provider]  Na Sulfate-K Sulfate-Mg Sulf (SUPREP BOWEL PREP KIT) 17.5-3.13-1.6 GM/177ML SOLN Take 1 kit by mouth as directed. Patient not taking: Reported on 12/23/2020 12/02/20   Zehr, Laban Emperor, PA-C  pravastatin (PRAVACHOL) 40 MG tablet Take 1 tablet (40 mg total) by mouth daily. 06/28/13   Lucia Gaskins, MD  predniSONE (DELTASONE) 10 MG tablet Take 1 tablet (10 mg total) by mouth daily with breakfast. 12/17/21 01/16/22  Collier Salina, MD  silver sulfADIAZINE (SILVADENE) 1 % cream Apply 3-4 times daily to wound Patient not taking: Reported on 08/31/2021 09/27/17   Florian Buff, MD  traZODone (DESYREL) 50 MG tablet Take by mouth. Patient not taking: Reported on 12/14/2021 07/07/21   [provider]  varenicline (CHANTIX) 1 MG tablet Take 1 mg by mouth 2 (two) times daily. Patient not taking: Reported on 07/20/2021    [provider]    Physical Exam: Vitals:   01/14/22 2301 01/14/22 2330 01/15/22 0100 01/15/22 0130  BP: 113/74 113/73 125/77 116/81  Pulse: (!) 122 (!) 122 (!) 121 (!) 114  Resp: (!) 35 (!) 37 (!) 36 (!) 35  Temp:    98.7 F (37.1 C)  TempSrc:    Oral  SpO2: 93% 96% 99% 99%  Weight:      Height:       1.  General: Patient lying supine in bed,  no acute distress   2. Psychiatric: Alert and oriented x 3, mood and behavior normal for situation, pleasant and cooperative with exam   3. Neurologic: Speech and language are normal, face is symmetric, moves all 4 extremities voluntarily, at baseline without acute deficits on limited exam   4. HEENMT:  Head is atraumatic, normocephalic, pupils reactive to light, neck is supple,  trachea is midline, mucous membranes are moist   5. Respiratory : Rhonchi bilateral bases, mild wheezing diffusely, no cyanosis, no increase in work of breathing or accessory muscle use, maintaining oxygen saturations with 2 L nasal cannula   6. Cardiovascular : Heart rate tachycardic, rhythm is regular, no murmurs, rubs or gallops, peripheral pulses palpated   7. Gastrointestinal:  Abdomen is soft, nondistended, nontender to palpation bowel sounds active, no masses or organomegaly palpated   8. Skin:  Skin is warm, dry and intact without rashes, acute lesions, or ulcers on limited exam   9.Musculoskeletal:  No acute deformities or trauma, no asymmetry in tone, no peripheral edema, peripheral pulses palpated, no tenderness  to palpation in the extremities  Data Reviewed: In the ED Temp 98.5, heart rate 122-1 33, respiratory rate 28-44, blood pressure 113/73-137/92, satting at 96% on 2 L nasal cannula Leukocytosis at 12.0, hemoglobin 11.0 Chemistry shows a hyponatremia 132, hypokalemia 3.1, low calcium that corrects for albumin, T. bili 1.4 Blood cultures pending Lactic acid 1.7 CTA shows no PE.  Does show bilateral lower lobe consolidation Zithromax Rocephin started LR started 150 mL/h Admission requested for pneumonia and respiratory failure with hypoxia  Assessment and Plan: * CAP (community acquired pneumonia) - CT scan shows bilateral lower lobe consolidation - Leukocytosis of 12, heart rate 122-1 33, acute respiratory failure requiring 2 L nasal cannula - Blood cultures pending - Sputum cultures pending - Rocephin and Zithromax started in the ED - Continue Rocephin and Zithromax - Strep and Legionella urine antigens pending - Continue to monitor  Hyponatremia - Hyponatremia at 132 - Continue normal saline throughout the night - Trend in a.m.  Hypokalemia - 40 mEq of potassium replaced - Trend in the a.m. - Check mag in the a.m.  Acute respiratory failure with  hypoxia ([facility]) - Secondary to pneumonia and sepsis - Most likely undiagnosed COPD also contributing -Will need outpatient pulmonology follow-up - Continue as needed albuterol and scheduled DuoNeb - Continue treatment for community-acquired pneumonia - Continue oxygen supplementation, wean off as tolerated - Continue to monitor  Sepsis ([place]) - Heart rate 122-20 33, respiratory rate 28-44, leukocytosis 12.0 - Acute endorgan damage with respiratory failure requiring 2 L nasal cannula - Lactic acid normal 1.7 - Blood culture pending - Sputum culture pending - Most likely source is bilateral pneumonia - Strep and Legionella urine antigens pending - Continue to monitor  History of CVA with residual deficit - Continue aspirin and statin - Continue to monitor  Smoker - Smokes half a pack per day - Has Chantix at home but has not yet started it - Requests nicotine patch in the hospital - 14 mg nicotine patch ordered, patient counseled on importance of cessation, patient counseled on likelihood of having COPD and how smoking can lead to further compromise/damage  Essential hypertension - Holding Norvasc for now as patient did have soft pressures in the ED      Advance Care Planning:   Code Status: Full Code   Consults: None  Family Communication: Husband and daughter at bedside  Severity of Illness: The appropriate patient status for this patient is INPATIENT. Inpatient status is judged to be reasonable and necessary in order to provide the required intensity of service to ensure the patient's safety. The patient's presenting symptoms, physical exam findings, and initial radiographic and laboratory data in the context of their chronic comorbidities is felt to place them at high risk for further clinical deterioration. Furthermore, it is not anticipated that the patient will be medically stable for discharge from the hospital within 2 midnights of admission.   * I certify that at the  point of admission it is my clinical judgment that the patient will require inpatient hospital care spanning beyond 2 midnights from the point of admission due to high intensity of service, high risk for further deterioration and high frequency of surveillance required.*  Author: Rolla Plate, DO 01/15/2022 1:55 AM  For on call review [email or web address].

## 2022-01-15 NOTE — Progress Notes (Signed)
IV fluid bag completed and writer asked Dr Roger Shelter if he wanted to continue IV fluids since they are set to expire (dc) at 1900-2000. Per verbal from Dr Roger Shelter, do not renew current IV fluids right now. Night RN aware that IV fluids are now stopped.

## 2022-01-15 NOTE — Assessment & Plan Note (Addendum)
-   Blood pressure has improved,  -Hypertensive -Home medication of Norvasc -P.o. hydralazine was added

## 2022-01-15 NOTE — Assessment & Plan Note (Addendum)
-  Resolved sepsis physiology   - Met sepsis criteria on admission source of infection pneumonia, with acute respiratory failure heart rate 122-20 33, respiratory rate 28-44, leukocytosis 12.0  Afebrile, mildly hypertensive, on room air, satting greater 97%  - Acute endorgan damage with respiratory failure requiring 2 L nasal cannula - Lactic acid normal 1.7 >>1.4  -Procalcitonin level 0.77 - Blood culture --- no growth to date - Sputum culture -no results - Most likely source is bilateral pneumonia - Strep and Legionella urine antigens -no results

## 2022-01-15 NOTE — Assessment & Plan Note (Addendum)
-  Improving -Has been on IV antibiotics of azithromycin and Zosyn, switching antibiotics to p.o. -Blood cultures/sputum cultures no growth to date x3 days now -Unable to obtain sputum cultures yet - CT scan shows bilateral lower lobe consolidation - With hypoxia WAS requiring 2 L of oxygen, satting 100% >> much improved weaning off supplemental oxygen  - Strep and Legionella urine antigens -no results SARS-CoV-2, influenza A/B all negative  - Continue to monitor

## 2022-01-15 NOTE — Assessment & Plan Note (Addendum)
-   40 mEq of potassium replaced -Monitoring and repleting, along with IV fluids

## 2022-01-15 NOTE — ED Notes (Signed)
Pt daughter requesting to speak with Dr Zierle-Ghosh- message sent to Dr via secure chat 

## 2022-01-15 NOTE — Assessment & Plan Note (Signed)
-   Continue aspirin and statin - Health PT ordered

## 2022-01-15 NOTE — ED Notes (Signed)
Pt daughter requesting to speak with Dr Clearence Ped- message sent to Dr via secure chat

## 2022-01-15 NOTE — ED Notes (Signed)
Dr Clearence Ped at bedside talking with pt and family

## 2022-01-15 NOTE — ED Notes (Signed)
Pt ate all of food, pt repositioned in bed for comfort

## 2022-01-15 NOTE — Assessment & Plan Note (Addendum)
-   Hyponatremia at 132 >134  - Resolved

## 2022-01-15 NOTE — Hospital Course (Signed)
[name] is a 56 y.o. female with medical history significant of hyperlipidemia, hypertension, acquired hypothyroidism reportedly no longer on levothyroxine, stroke, tobacco use disorder, and more presents the ED with a chief complaint of dyspnea.  Patient reports that she has had dyspnea that started 2 days ago.  Been progressively worsening.  She denies cough or fever.  She has had chest pain when she is laying flat.  The chest pain is located on her left side and feels like a pressure when she is laying flat.  She does have a left greater than right pleural effusion.  Patient reports that she has been wheezing.  She has no nebulizer and no inhaler at home.  She has not tried anything to improve her breathing.  She reports that her dyspnea is worse on exertion but present at rest as well.  Patient wears no oxygen at baseline.  Patient reports no diagnosis of COPD.  She does smoke half a pack per day.  She request nicotine patch.  She has had Chantix at home but has not started it yet.  She has been advised on the importance of smoking cessation.  Patient has no other complaints at this time.   Patient smokes as discussed above.  She does not drink alcohol.  She does use marijuana and her last use was 1 week ago.  She is vaccinated for COVID.  Patient is full code.  ED Temp 98.5, heart rate 122-1 33, respiratory rate 28-44, blood pressure 113/73-137/92, satting at 96% on 2 L nasal cannula Leukocytosis at 12.0, hemoglobin 11.0 Chemistry shows a hyponatremia 132, hypokalemia 3.1, low calcium that corrects for albumin, T. bili 1.4 Blood cultures pending Lactic acid 1.7 CTA shows no PE.  Does show bilateral lower lobe consolidation Zithromax Rocephin started LR started 150 mL/h Admission requested for pneumonia and respiratory failure with hypoxia

## 2022-01-15 NOTE — Assessment & Plan Note (Signed)
-   Smokes half a pack per day - Has Chantix at home but has not yet started it - Requests nicotine patch in the hospital - 14 mg nicotine patch ordered, patient counseled on importance of cessation, patient counseled on likelihood of having COPD and how smoking can lead to further compromise/damage

## 2022-01-15 NOTE — TOC Progression Note (Signed)
  Transition of Care [facility]) Screening Note   Patient Details  Name: [name] Date of Birth: 11/15/1965   Transition of Care [facility]) CM/SW Contact:    Shade Flood, LCSW Phone Number: 01/15/2022, 10:28 AM    Transition of Care Department [facility]) has reviewed patient and no TOC needs have been identified at this time. We will continue to monitor patient advancement through interdisciplinary progression rounds. If new patient transition needs arise, please place a TOC consult.

## 2022-01-15 NOTE — Assessment & Plan Note (Addendum)
Blood pressure (!) 144/77, pulse 91, temperature 98.6 F (37 C), temperature source Oral, resp. rate (!) 24,  SpO2 100 % on 2 L of oxygen  -Still tachypneic, shortness of breath is improving  - Met sepsis criteria due to acute respiratory failure -hypoxia likely due to pneumonia exacerbated by underlying possible COPD  --Wean off supplemental oxygen as patient is satting 100% now -DuoNeb bronchodilators scheduled and as needed -We will continue with IV steroids -Continue IV antibiotics for underlying pneumonia  - Continue to monitor -We will need follow-up with pulmonologist once stable formal diagnosis of possible COPD  The patient daughter requested transfer to [facility] bed still not available

## 2022-01-15 NOTE — Progress Notes (Signed)
PROGRESS NOTE    Patient: [name]                            PCP: Bernerd Limbo, MD                    DOB: 1965-06-29            DOA: 01/14/2022 NKN:[id number]             DOS: 01/15/2022, 10:31 AM   LOS: 1 day   Date of Service: The patient was seen and examined on 01/15/2022  Subjective:   The patient was seen and examined this morning. Stable at this time. Still complaining of : Shortness of breath, denies any chest pain On 2 L of oxygen, satting 100%  Brief Narrative:   [name] is a 56 y.o. female with medical history significant of hyperlipidemia, hypertension, acquired hypothyroidism reportedly no longer on levothyroxine, stroke, tobacco use disorder, and more presents the ED with a chief complaint of dyspnea.  Patient reports that she has had dyspnea that started 2 days ago.  Been progressively worsening.  She denies cough or fever.  She has had chest pain when she is laying flat.  The chest pain is located on her left side and feels like a pressure when she is laying flat.  She does have a left greater than right pleural effusion.  Patient reports that she has been wheezing.  She has no nebulizer and no inhaler at home.  She has not tried anything to improve her breathing.  She reports that her dyspnea is worse on exertion but present at rest as well.  Patient wears no oxygen at baseline.  Patient reports no diagnosis of COPD.  She does smoke half a pack per day.  She request nicotine patch.  She has had Chantix at home but has not started it yet.  She has been advised on the importance of smoking cessation.  Patient has no other complaints at this time.   Patient smokes as discussed above.  She does not drink alcohol.  She does use marijuana and her last use was 1 week ago.  She is vaccinated for COVID.  Patient is full code.  ED Temp 98.5, heart rate 122-1 33, respiratory rate 28-44, blood pressure 113/73-137/92, satting at 96% on 2 L nasal cannula Leukocytosis at  12.0, hemoglobin 11.0 Chemistry shows a hyponatremia 132, hypokalemia 3.1, low calcium that corrects for albumin, T. bili 1.4 Blood cultures pending Lactic acid 1.7 CTA shows no PE.  Does show bilateral lower lobe consolidation Zithromax Rocephin started LR started 150 mL/h Admission requested for pneumonia and respiratory failure with hypoxia    Assessment & Plan:   Principal Problem:   CAP (community acquired pneumonia) Active Problems:   Essential hypertension   Smoker   History of CVA with residual deficit   Sepsis ([place])   Acute respiratory failure with hypoxia (HCC)   Hypokalemia   Hyponatremia     Assessment and Plan: * CAP (community acquired pneumonia) - CT scan shows bilateral lower lobe consolidation - With hypoxia currently requiring 2 L of oxygen, satting 100% - Blood cultures pending - Sputum cultures pending - Continue Rocephin and Zithromax - Strep and Legionella urine antigens pending - Continue to monitor  Hyponatremia - Hyponatremia at 132 >134  - Continue normal saline throughout the night   Hypokalemia - 40 mEq of potassium replaced -Monitoring and repleting,  along with IV fluids  Acute respiratory failure with hypoxia ([place]) - Meeting sepsis criteria due to acute respiratory failure -hypoxia likely due to pneumonia exacerbated by underlying possible COPD Blood pressure (!) 144/82, pulse (!) 117, temperature 97.7 F (36.5 C),SpO2 100 % on 2 L of oxygen  -Continuing O2 by nasal cannula, -DuoNeb bronchodilators scheduled and as needed -IV steroids -Continue IV antibiotics for underlying pneumonia  - Continue to monitor -We will need follow-up with pulmonologist once stable formal diagnosis of possible COPD  Per patient and daughter request transfer patient to [facility] -for inpatient pulmonary evaluation  Sepsis ([place]) - Met sepsis criteria on admission source of infection pneumonia, with acute respiratory failure heart rate 122-20 33,  respiratory rate 28-44, leukocytosis 12.0  Currently stable: Blood pressure (!) 144/82, pulse (!) 117, temperature 97.7 F (36.5 C), SpO2 100 % on 2L   - Acute endorgan damage with respiratory failure requiring 2 L nasal cannula - Lactic acid normal 1.7 >>1.4  -Procalcitonin level 0.77 - Blood culture pending - Sputum culture pending - Most likely source is bilateral pneumonia - Strep and Legionella urine antigens pending - Continue to monitor  History of CVA with residual deficit - Continue aspirin and statin - Continue to monitor  Smoker - Smokes half a pack per day - Has Chantix at home but has not yet started it - Requests nicotine patch in the hospital - 14 mg nicotine patch ordered, patient counseled on importance of cessation, patient counseled on likelihood of having COPD and how smoking can lead to further compromise/damage  Essential hypertension - Holding Norvasc for now as patient did have soft pressures in the ED            ----------------------------------------------------------------------------------------------------------------------------------------------- Nutritional status:  The patient's BMI is: Body mass index is 45.19 kg/m. I agree with the assessment and plan as outlined   Cultures; Blood Cultures x 2 >> Sputum Culture >>          ------------------------------------------------------------------------------------------------------------------------------------------------  DVT prophylaxis:  heparin injection 5,000 Units Start: 01/15/22 0600 SCDs Start: 01/15/22 0146   Code Status:   Code Status: Full Code  Family Communication: Husband at bedside-updated The above findings and plan of care has been discussed with patient (and family)  in detail,  they expressed understanding and agreement of above. -Advance care planning has been discussed.   Admission status:   Status is: Inpatient Remains inpatient appropriate  because: Treating acute respiratory failure likely due to pneumonia new diagnosis COPD     Procedures:   No admission procedures for hospital encounter.   Antimicrobials:  Anti-infectives (From admission, onward)    Start     Dose/Rate Route Frequency Ordered Stop   01/14/22 2215  cefTRIAXone (ROCEPHIN) 2 g in sodium chloride 0.9 % 100 mL IVPB        2 g 200 mL/hr over 30 Minutes Intravenous Every 24 hours 01/14/22 2208 01/19/22 2214   01/14/22 2215  azithromycin (ZITHROMAX) 500 mg in sodium chloride 0.9 % 250 mL IVPB        500 mg 250 mL/hr over 60 Minutes Intravenous Every 24 hours 01/14/22 2208 01/19/22 2214        Medication:   acidophilus  1 capsule Oral TID WC   aspirin EC  325 mg Oral Daily   guaiFENesin-dextromethorphan  10 mL Oral Q8H   heparin  5,000 Units Subcutaneous Q8H   ipratropium  0.5 mg Nebulization Q6H   levalbuterol  0.63 mg Nebulization Q6H  methylPREDNISolone (SOLU-MEDROL) injection  40 mg Intravenous Q12H   nicotine  14 mg Transdermal Daily   pravastatin  40 mg Oral Daily    ipratropium-albuterol   Objective:   Vitals:   01/15/22 0712 01/15/22 0800 01/15/22 0802 01/15/22 0900  BP: (!) 150/74 139/88  (!) 144/82  Pulse: (!) 119 (!) 117    Resp:      Temp:   97.7 F (36.5 C)   TempSrc:   Axillary   SpO2: 99% 100%    Weight:      Height:        Intake/Output Summary (Last 24 hours) at 01/15/2022 1031 Last data filed at 01/15/2022 3817 Gross per 24 hour  Intake 1460.85 ml  Output --  Net 1460.85 ml   Filed Weights   01/14/22 2139  Weight: 127 kg     Examination:   Physical Exam  Constitution:  Alert, cooperative, mild-moderate distress with shortness of breath, O2 by nasal cannula appears calm and comfortable  Psychiatric:   Normal and stable mood and affect, cognition intact,   HEENT:        Normocephalic, PERRL, otherwise with in Normal limits  Chest:         Chest symmetric Cardio vascular:  S1/S2, RRR, No murmure, No  Rubs or Gallops  pulmonary: Diffuse rhonchi, respirations unlabored, ++wheezes / NO crackles Abdomen: Soft, non-tender, non-distended, bowel sounds,no masses, no organomegaly Muscular skeletal: Limited exam - in bed, able to move all 4 extremities,   Neuro: CNII-XII intact. , normal motor and sensation, reflexes intact  Extremities: No pitting edema lower extremities, +2 pulses  Skin: Dry, warm to touch, negative for any Rashes, No open wounds Wounds: per nursing documentation   ------------------------------------------------------------------------------------------------------------------------------------------    LABs:     Latest Ref Rng & Units 01/15/2022    4:18 AM 01/14/2022   10:27 PM 12/14/2021    3:29 PM  CBC  WBC 4.0 - 10.5 K/uL 9.5  12.0  3.8   Hemoglobin 12.0 - 15.0 g/dL 9.9  11.0  10.4   Hematocrit 36.0 - 46.0 % 31.4  34.2  31.8   Platelets 150 - 400 K/uL 339  399  342       Latest Ref Rng & Units 01/15/2022    4:18 AM 01/14/2022   11:00 PM 12/14/2021    3:29 PM  CMP  Glucose 70 - 99 mg/dL 112  127  81   BUN 6 - 20 mg/dL 16  18  10    Creatinine 0.44 - 1.00 mg/dL 0.70  0.84  0.60   Sodium 135 - 145 mmol/L 134  132  138   Potassium 3.5 - 5.1 mmol/L 3.2  3.1  3.6   Chloride 98 - 111 mmol/L 100  98  106   CO2 22 - 32 mmol/L 26  25  26    Calcium 8.9 - 10.3 mg/dL 8.2  8.1  8.7   Total Protein 6.5 - 8.1 g/dL 7.7  7.7  8.3   Total Bilirubin 0.3 - 1.2 mg/dL 1.1  1.4  0.3   Alkaline Phos 38 - 126 U/L 52  51    AST 15 - 41 U/L 48  48  60   ALT 0 - 44 U/L 20  19  20         Micro Results Recent Results (from the past 240 hour(s))  Resp Panel by RT-PCR (Flu A&B, Covid) Anterior Nasal Swab     Status: None   Collection Time:  01/14/22 10:27 PM   Specimen: Anterior Nasal Swab  Result Value Ref Range Status   SARS Coronavirus 2 by RT PCR NEGATIVE NEGATIVE Final    Comment: (NOTE) SARS-CoV-2 target nucleic acids are NOT DETECTED.  The SARS-CoV-2 RNA is generally  detectable in upper respiratory specimens during the acute phase of infection. The lowest concentration of SARS-CoV-2 viral copies this assay can detect is 138 copies/mL. A negative result does not preclude SARS-Cov-2 infection and should not be used as the sole basis for treatment or other patient management decisions. A negative result may occur with  improper specimen collection/handling, submission of specimen other than nasopharyngeal swab, presence of viral mutation(s) within the areas targeted by this assay, and inadequate number of viral copies(<138 copies/mL). A negative result must be combined with clinical observations, patient history, and epidemiological information. The expected result is Negative.  Fact Sheet for Patients:  EntrepreneurPulse.com.au  Fact Sheet for Healthcare Providers:  IncredibleEmployment.be  This test is no t yet approved or cleared by the [place] FDA and  has been authorized for detection and/or diagnosis of SARS-CoV-2 by FDA under an Emergency Use Authorization (EUA). This EUA will remain  in effect (meaning this test can be used) for the duration of the COVID-19 declaration under Section 564(b)(1) of the Act, 21 U.S.C.section 360bbb-3(b)(1), unless the authorization is terminated  or revoked sooner.       Influenza A by PCR NEGATIVE NEGATIVE Final   Influenza B by PCR NEGATIVE NEGATIVE Final    Comment: (NOTE) The Xpert Xpress SARS-CoV-2/FLU/RSV plus assay is intended as an aid in the diagnosis of influenza from Nasopharyngeal swab specimens and should not be used as a sole basis for treatment. Nasal washings and aspirates are unacceptable for Xpert Xpress SARS-CoV-2/FLU/RSV testing.  Fact Sheet for Patients: EntrepreneurPulse.com.au  Fact Sheet for Healthcare Providers: IncredibleEmployment.be  This test is not yet approved or cleared by the [place] FDA  and has been authorized for detection and/or diagnosis of SARS-CoV-2 by FDA under an Emergency Use Authorization (EUA). This EUA will remain in effect (meaning this test can be used) for the duration of the COVID-19 declaration under Section 564(b)(1) of the Act, 21 U.S.C. section 360bbb-3(b)(1), unless the authorization is terminated or revoked.  Performed at [facility], [address], [place], [place] [address]   Blood Culture (routine x 2)     Status: None (Preliminary result)   Collection Time: 01/14/22 10:27 PM   Specimen: Right Antecubital; Blood  Result Value Ref Range Status   Specimen Description   Final    RIGHT ANTECUBITAL BOTTLES DRAWN AEROBIC AND ANAEROBIC   Special Requests   Final    Blood Culture results may not be optimal due to an inadequate volume of blood received in culture bottles   Culture   Final    NO GROWTH < 12 HOURS Performed at [facility], [address], [place], [place] [address]    Report Status PENDING  Incomplete  Blood Culture (routine x 2)     Status: None (Preliminary result)   Collection Time: 01/14/22 11:00 PM   Specimen: BLOOD RIGHT HAND  Result Value Ref Range Status   Specimen Description   Final    BLOOD RIGHT HAND BOTTLES DRAWN AEROBIC AND ANAEROBIC   Special Requests   Final    Blood Culture results may not be optimal due to an excessive volume of blood received in culture bottles   Culture   Final    NO GROWTH <  12 HOURS Performed at [facility], [address], [place], [place] [address]    Report Status PENDING  Incomplete  MRSA Next Gen by PCR, Nasal     Status: None   Collection Time: 01/15/22  3:16 AM   Specimen: Nasal Mucosa; Nasal Swab  Result Value Ref Range Status   MRSA by PCR Next Gen NOT DETECTED NOT DETECTED Final    Comment: (NOTE) The GeneXpert MRSA Assay (FDA approved for NASAL specimens only), is one component of a comprehensive MRSA colonization surveillance program. It is not intended to diagnose MRSA  infection nor to guide or monitor treatment for MRSA infections. Test performance is not FDA approved in patients less than 43 years old. Performed at [facility], [address] [place], [place] [address]     Radiology Reports CT Angio Chest PE W and/or Wo Contrast  Result Date: 01/14/2022 CLINICAL DATA:  This for 2 days with shortness of breath, initial encounter EXAM: CT ANGIOGRAPHY CHEST WITH CONTRAST TECHNIQUE: Multidetector CT imaging of the chest was performed using the standard protocol during bolus administration of intravenous contrast. Multiplanar CT image reconstructions and MIPs were obtained to evaluate the vascular anatomy. RADIATION DOSE REDUCTION: This exam was performed according to the departmental dose-optimization program which includes automated exposure control, adjustment of the mA and/or kV according to patient size and/or use of iterative reconstruction technique. CONTRAST:  165m OMNIPAQUE IOHEXOL 350 MG/ML SOLN COMPARISON:  Chest x-ray from earlier in the same day. FINDINGS: Cardiovascular: Thoracic aorta demonstrates atherosclerotic calcifications without aneurysmal dilatation or dissection. Mild cardiac enlargement is seen. Small pericardial effusion is noted measuring 1 cm in its greatest thickness. Coronary calcifications are noted. The pulmonary artery shows a normal branching pattern bilaterally. No filling defect to suggest pulmonary embolism is noted. Mediastinum/Nodes: Thoracic inlet is within normal limits. No sizable hilar or mediastinal adenopathy is noted. The esophagus as visualized is within normal limits. Lungs/Pleura: Lungs show bilateral pleural effusions right slightly greater than left with lower lobe consolidation identified. No sizable parenchymal nodule is noted. Upper Abdomen: Visualized upper abdomen demonstrates mild fatty infiltration of the liver. Musculoskeletal: Degenerative change of the thoracic spine is seen. No acute rib abnormality is noted.  Review of the MIP images confirms the above findings. IMPRESSION: No evidence of pulmonary emboli. Bilateral lower lobe consolidation with associated effusions left greater than right. Fatty liver. Mild pericardial effusion of unknown chronicity. Aortic Atherosclerosis (ICD10-I70.0). Electronically Signed   By: MInez CatalinaM.D.   On: 01/14/2022 23:03   DG Chest Portable 1 View  Result Date: 01/14/2022 CLINICAL DATA:  Shortness of breath, tachycardia.  History of CVA. EXAM: PORTABLE CHEST 1 VIEW COMPARISON:  Chest radiograph dated June 26, 2013 FINDINGS: The heart is enlarged. Atherosclerotic calcification of the aortic arch. Right lung is clear. Left basilar atelectasis or infiltrate. No acute osseous abnormality. IMPRESSION: 1. Cardiomegaly. 2. Left basilar atelectasis or infiltrate. Electronically Signed   By: IKeane PoliceD.O.   On: 01/14/2022 22:19    SIGNED: SDeatra James MD, FHM. [facility],  Pager (please use [email or web address] to page/text) Please use Epic Secure Chat for non-urgent communication (7AM-7PM)  If 7PM-7AM, please contact night-coverage [email or web address], 01/15/2022, 10:31 AM

## 2022-01-16 ENCOUNTER — Inpatient Hospital Stay (HOSPITAL_COMMUNITY): Payer: Medicaid Other

## 2022-01-16 DIAGNOSIS — J9601 Acute respiratory failure with hypoxia: Secondary | ICD-10-CM | POA: Diagnosis not present

## 2022-01-16 DIAGNOSIS — A419 Sepsis, unspecified organism: Secondary | ICD-10-CM | POA: Diagnosis not present

## 2022-01-16 DIAGNOSIS — I10 Essential (primary) hypertension: Secondary | ICD-10-CM | POA: Diagnosis not present

## 2022-01-16 DIAGNOSIS — J189 Pneumonia, unspecified organism: Secondary | ICD-10-CM | POA: Diagnosis not present

## 2022-01-16 LAB — BASIC METABOLIC PANEL
Anion gap: 7 (ref 5–15)
BUN: 18 mg/dL (ref 6–20)
CO2: 26 mmol/L (ref 22–32)
Calcium: 8.3 mg/dL — ABNORMAL LOW (ref 8.9–10.3)
Chloride: 103 mmol/L (ref 98–111)
Creatinine, Ser: 0.58 mg/dL (ref 0.44–1.00)
GFR, Estimated: >60 mL/min (ref >60)
Glucose, Bld: 153 mg/dL — ABNORMAL HIGH (ref 70–99)
Potassium: 3.7 mmol/L (ref 3.5–5.1)
Sodium: 136 mmol/L (ref 135–145)

## 2022-01-16 LAB — CBC
HCT: 27.7 % — ABNORMAL LOW (ref 36.0–46.0)
Hemoglobin: 8.7 g/dL — ABNORMAL LOW (ref 12.0–15.0)
MCH: 27.3 pg (ref 26.0–34.0)
MCHC: 31.4 g/dL (ref 30.0–36.0)
MCV: 86.8 fL (ref 80.0–100.0)
Platelets: 321 10*3/uL (ref 150–400)
RBC: 3.19 MIL/uL — ABNORMAL LOW (ref 3.87–5.11)
RDW: 16.2 % — ABNORMAL HIGH (ref 11.5–15.5)
WBC: 8.7 10*3/uL (ref 4.0–10.5)
nRBC: 0 % (ref 0.0–0.2)

## 2022-01-16 LAB — PROCALCITONIN: Procalcitonin: 1.13 ng/mL

## 2022-01-16 LAB — IRON AND TIBC
Iron: 35 ug/dL (ref 28–170)
Saturation Ratios: 24 % (ref 10.4–31.8)
TIBC: 148 ug/dL — ABNORMAL LOW (ref 250–450)
UIBC: 113 ug/dL

## 2022-01-16 MED ORDER — LEVALBUTEROL HCL 0.63 MG/3ML IN NEBU
0.6300 mg | INHALATION_SOLUTION | Freq: Four times a day (QID) | RESPIRATORY_TRACT | Status: DC
  Administered 2022-01-16 – 2022-01-17 (×6): 0.63 mg via RESPIRATORY_TRACT
  Filled 2022-01-16 (×6): qty 3

## 2022-01-16 MED ORDER — IPRATROPIUM BROMIDE 0.02 % IN SOLN
0.5000 mg | Freq: Four times a day (QID) | RESPIRATORY_TRACT | Status: DC
  Administered 2022-01-16 – 2022-01-17 (×6): 0.5 mg via RESPIRATORY_TRACT
  Filled 2022-01-16 (×6): qty 2.5

## 2022-01-16 MED ORDER — HEPARIN SODIUM (PORCINE) 5000 UNIT/ML IJ SOLN
5000.0000 [IU] | Freq: Three times a day (TID) | INTRAMUSCULAR | Status: DC
  Administered 2022-01-17 – 2022-01-18 (×4): 5000 [IU] via SUBCUTANEOUS
  Filled 2022-01-16 (×4): qty 1

## 2022-01-16 NOTE — Progress Notes (Signed)
Daughter; Janett Billow, called this morning very concerned that mother had not been transferred to [facility]. Stated she will need to talk to someone higher in charge than charge nurse. I told her I will notify our [facility] when she arrives.

## 2022-01-16 NOTE — Care Plan (Signed)
Pt has a 20 gauge IV to her L antecubital that is flushing good but refused to have any IV antibiotics infused to that access due to burning pain. Discussed the importance of IV antibiotic treatment. Patient verbalized understanding. Attempted to insert another IV access but refused to have another stick for tonight after the second attempt. MD on call is aware.

## 2022-01-16 NOTE — Progress Notes (Signed)
[facility] PROGRESS NOTE    Patient: [name]                            PCP: Bernerd Limbo, MD                    DOB: 30-Jun-1965            DOA: 01/14/2022 [id number]:[id number]             DOS: 01/16/2022, 11:39 AM   LOS: 2 days   Date of Service: The patient was seen and examined on 01/16/2022  Subjective:   The patient was seen and examined this morning, awake laying in bed comfortable, on 2 L of oxygen, satting 100% Reported shortness of breath has improved at rest.  But with minimal exertion patient feels worsening of her shortness of breath.  Patient had no appetite today she was encouraged to increase her oral intake today she is cooperative willing to try.  Husband present at bedside  Brief Narrative:   [name] is a 56 y.o. female with medical history significant of hyperlipidemia, hypertension, acquired hypothyroidism reportedly no longer on levothyroxine, stroke, tobacco use disorder, and more presents the ED with a chief complaint of dyspnea.  Patient reports that she has had dyspnea that started 2 days ago.  Been progressively worsening.  She denies cough or fever.  She has had chest pain when she is laying flat.  The chest pain is located on her left side and feels like a pressure when she is laying flat.  She does have a left greater than right pleural effusion.  Patient reports that she has been wheezing.  She has no nebulizer and no inhaler at home.  She has not tried anything to improve her breathing.  She reports that her dyspnea is worse on exertion but present at rest as well.  Patient wears no oxygen at baseline.  Patient reports no diagnosis of COPD.  She does smoke half a pack per day.  She request nicotine patch.  She has had Chantix at home but has not started it yet.  She has been advised on the importance of smoking cessation.  Patient has no other complaints at this time.   Patient smokes as discussed above.  She does not drink alcohol.  She does use marijuana and her  last use was 1 week ago.  She is vaccinated for COVID.  Patient is full code.  ED Temp 98.5, heart rate 122-1 33, respiratory rate 28-44, blood pressure 113/73-137/92, satting at 96% on 2 L nasal cannula Leukocytosis at 12.0, hemoglobin 11.0 Chemistry shows a hyponatremia 132, hypokalemia 3.1, low calcium that corrects for albumin, T. bili 1.4 Blood cultures pending Lactic acid 1.7 CTA shows no PE.  Does show bilateral lower lobe consolidation Zithromax Rocephin started LR started 150 mL/h Admission requested for pneumonia and respiratory failure with hypoxia    Assessment & Plan:   Principal Problem:   CAP (community acquired pneumonia) Active Problems:   Essential hypertension   Smoker   History of CVA with residual deficit   Sepsis ([place])   Acute respiratory failure with hypoxia (HCC)   Hypokalemia   Hyponatremia     Assessment and Plan: * CAP (community acquired pneumonia) -Improving -Continue IV antibiotics Rocephin and azithromycin, -Following with cultures - CT scan shows bilateral lower lobe consolidation - With hypoxia currently requiring 2 L of oxygen, satting 100% >> much improved  weaning off supplemental oxygen  - Strep and Legionella urine antigens pending - Continue to monitor  Hyponatremia - Hyponatremia at 132 >134  - Continue normal saline throughout the night   Hypokalemia - 40 mEq of potassium replaced -Monitoring and repleting, along with IV fluids  Acute respiratory failure with hypoxia (HCC) Blood pressure (!) 144/77, pulse 91, temperature 98.6 F (37 C), temperature source Oral, resp. rate (!) 24,  SpO2 100 % on 2 L of oxygen  -Still tachypneic, shortness of breath is improving  - Met sepsis criteria due to acute respiratory failure -hypoxia likely due to pneumonia exacerbated by underlying possible COPD  --Wean off supplemental oxygen as patient is satting 100% now -DuoNeb bronchodilators scheduled and as needed -We will continue with  IV steroids -Continue IV antibiotics for underlying pneumonia  - Continue to monitor -We will need follow-up with pulmonologist once stable formal diagnosis of possible COPD  The patient daughter requested transfer to [facility] bed still not available  Sepsis ([place]) -Much improved sepsis physiology  - Met sepsis criteria on admission source of infection pneumonia, with acute respiratory failure heart rate 122-20 33, respiratory rate 28-44, leukocytosis 12.0  Currently stable: Blood pressure (!) 144/77, pulse 91, temperature 98.6 F (37 C),SpO2 100 % on 2 L    - Acute endorgan damage with respiratory failure requiring 2 L nasal cannula - Lactic acid normal 1.7 >>1.4  -Procalcitonin level 0.77 - Blood culture pending - Sputum culture pending - Most likely source is bilateral pneumonia - Strep and Legionella urine antigens pending - Continue to monitor  History of CVA with residual deficit - Continue aspirin and statin - Continue to monitor  Smoker - Smokes half a pack per day - Has Chantix at home but has not yet started it - Requests nicotine patch in the hospital - 14 mg nicotine patch ordered, patient counseled on importance of cessation, patient counseled on likelihood of having COPD and how smoking can lead to further compromise/damage  Essential hypertension - Blood pressure has improved, stabilized, -Home medication of Norvasc restarted   ----------------------------------------------------------------------------------------------------------------------------------------------- Nutritional status:  The patient's BMI is: Body mass index is 45.19 kg/m. I agree with the assessment and plan as outlined   Cultures; Blood Cultures x 2 >> Sputum Culture >>     ------------------------------------------------------------------------------------------------------------------------------------------------  DVT prophylaxis:  heparin injection 5,000 Units Start: 01/17/22  0600 Place and maintain sequential compression device Start: 01/16/22 0653 SCDs Start: 01/15/22 0146   Code Status:   Code Status: Full Code  Family Communication: Husband at bedside-updated The above findings and plan of care has been discussed with patient (and family)  in detail,  they expressed understanding and agreement of above. -Advance care planning has been discussed.   Admission status:   Status is: Inpatient Remains inpatient appropriate because: Treating acute respiratory failure likely due to pneumonia new diagnosis COPD     Procedures:   No admission procedures for hospital encounter.   Antimicrobials:  Anti-infectives (From admission, onward)    Start     Dose/Rate Route Frequency Ordered Stop   01/14/22 2215  cefTRIAXone (ROCEPHIN) 2 g in sodium chloride 0.9 % 100 mL IVPB        2 g 200 mL/hr over 30 Minutes Intravenous Every 24 hours 01/14/22 2208 01/19/22 2214   01/14/22 2215  azithromycin (ZITHROMAX) 500 mg in sodium chloride 0.9 % 250 mL IVPB        500 mg 250 mL/hr over 60 Minutes Intravenous Every 24 hours 01/14/22  2208 01/19/22 2214        Medication:   acidophilus  1 capsule Oral TID WC   amLODipine  10 mg Oral Daily   aspirin EC  325 mg Oral Daily   atorvastatin  80 mg Oral Daily   Chlorhexidine Gluconate Cloth  6 each Topical Daily   guaiFENesin-dextromethorphan  10 mL Oral Q8H   [START ON 01/17/2022] heparin  5,000 Units Subcutaneous Q8H   ipratropium  0.5 mg Nebulization Q6H WA   levalbuterol  0.63 mg Nebulization Q6H WA   methylPREDNISolone (SOLU-MEDROL) injection  40 mg Intravenous Q12H   nicotine  14 mg Transdermal Daily    ipratropium-albuterol   Objective:   Vitals:   01/16/22 0700 01/16/22 0751 01/16/22 0800 01/16/22 0900  BP: (!) 144/78  (!) 144/77   Pulse:      Resp: (!) 28  (!) 24   Temp:  98.6 F (37 C)    TempSrc:  Oral    SpO2:    100%  Weight:      Height:        Intake/Output Summary (Last 24 hours) at  01/16/2022 1139 Last data filed at 01/16/2022 0800 Gross per 24 hour  Intake 1368.41 ml  Output 875 ml  Net 493.41 ml   Filed Weights   01/14/22 2139  Weight: 127 kg     Examination:     General:  AAO x 3,  cooperative, no distress;   HEENT:  Normocephalic, PERRL, otherwise with in Normal limits   Neuro:  CNII-XII intact. , normal motor and sensation, reflexes intact   Lungs:   To moderate shortness of breath, able to speak with complete sentences, scattered wheezes and rhonchi improving, negative any crackles  Cardio:    S1/S2, RRR, No murmure, No Rubs or Gallops   Abdomen:  Soft, non-tender, bowel sounds active all four quadrants, no guarding or peritoneal signs.  Muscular  skeletal:  Limited exam -global generalized weaknesses - in bed, able to move all 4 extremities,   2+ pulses,  symmetric, No pitting edema  Skin:  Dry, warm to touch, negative for any Rashes,  Wounds: Please see nursing documentation         ------------------------------------------------------------------------------------------------------------------------------------------    LABs:     Latest Ref Rng & Units 01/16/2022    4:16 AM 01/15/2022    4:18 AM 01/14/2022   10:27 PM  CBC  WBC 4.0 - 10.5 K/uL 8.7  9.5  12.0   Hemoglobin 12.0 - 15.0 g/dL 8.7  9.9  11.0   Hematocrit 36.0 - 46.0 % 27.7  31.4  34.2   Platelets 150 - 400 K/uL 321  339  399       Latest Ref Rng & Units 01/16/2022    4:16 AM 01/15/2022    4:18 AM 01/14/2022   11:00 PM  CMP  Glucose 70 - 99 mg/dL 153  112  127   BUN 6 - 20 mg/dL _0 Creatinine 0.44 - 1.00 mg/dL 0.58  0.70  0.84   Sodium 135 - 145 mmol/L 136  134  132   Potassium 3.5 - 5.1 mmol/L 3.7  3.2  3.1   Chloride 98 - 111 mmol/L 103  100  98   CO2 22 - 32 mmol/L _1 Calcium 8.9 - 10.3 mg/dL 8.3  8.2  8.1   Total Protein 6.5 - 8.1 g/dL  7.7  7.7   Total  Bilirubin 0.3 - 1.2 mg/dL  1.1  1.4   Alkaline Phos 38 - 126 U/L  52  51   AST 15 -  41 U/L  48  48   ALT 0 - 44 U/L  20  19        Micro Results Recent Results (from the past 240 hour(s))  Resp Panel by RT-PCR (Flu A&B, Covid) Anterior Nasal Swab     Status: None   Collection Time: 01/14/22 10:27 PM   Specimen: Anterior Nasal Swab  Result Value Ref Range Status   SARS Coronavirus 2 by RT PCR NEGATIVE NEGATIVE Final    Comment: (NOTE) SARS-CoV-2 target nucleic acids are NOT DETECTED.  The SARS-CoV-2 RNA is generally detectable in upper respiratory specimens during the acute phase of infection. The lowest concentration of SARS-CoV-2 viral copies this assay can detect is 138 copies/mL. A negative result does not preclude SARS-Cov-2 infection and should not be used as the sole basis for treatment or other patient management decisions. A negative result may occur with  improper specimen collection/handling, submission of specimen other than nasopharyngeal swab, presence of viral mutation(s) within the areas targeted by this assay, and inadequate number of viral copies(<138 copies/mL). A negative result must be combined with clinical observations, patient history, and epidemiological information. The expected result is Negative.  Fact Sheet for Patients:  EntrepreneurPulse.com.au  Fact Sheet for Healthcare Providers:  IncredibleEmployment.be  This test is no t yet approved or cleared by the [place] FDA and  has been authorized for detection and/or diagnosis of SARS-CoV-2 by FDA under an Emergency Use Authorization (EUA). This EUA will remain  in effect (meaning this test can be used) for the duration of the COVID-19 declaration under Section 564(b)(1) of the Act, 21 U.S.C.section 360bbb-3(b)(1), unless the authorization is terminated  or revoked sooner.       Influenza A by PCR NEGATIVE NEGATIVE Final   Influenza B by PCR NEGATIVE NEGATIVE Final    Comment: (NOTE) The Xpert Xpress SARS-CoV-2/FLU/RSV plus assay is  intended as an aid in the diagnosis of influenza from Nasopharyngeal swab specimens and should not be used as a sole basis for treatment. Nasal washings and aspirates are unacceptable for Xpert Xpress SARS-CoV-2/FLU/RSV testing.  Fact Sheet for Patients: EntrepreneurPulse.com.au  Fact Sheet for Healthcare Providers: IncredibleEmployment.be  This test is not yet approved or cleared by the [place] FDA and has been authorized for detection and/or diagnosis of SARS-CoV-2 by FDA under an Emergency Use Authorization (EUA). This EUA will remain in effect (meaning this test can be used) for the duration of the COVID-19 declaration under Section 564(b)(1) of the Act, 21 U.S.C. section 360bbb-3(b)(1), unless the authorization is terminated or revoked.  Performed at [facility], [address], [place], [place] [address]   Blood Culture (routine x 2)     Status: None (Preliminary result)   Collection Time: 01/14/22 10:27 PM   Specimen: Right Antecubital; Blood  Result Value Ref Range Status   Specimen Description   Final    RIGHT ANTECUBITAL BOTTLES DRAWN AEROBIC AND ANAEROBIC   Special Requests   Final    Blood Culture results may not be optimal due to an inadequate volume of blood received in culture bottles   Culture   Final    NO GROWTH < 12 HOURS Performed at [facility], [address], [place], [place] [address]    Report Status PENDING  Incomplete  Blood Culture (routine x 2)  Status: None (Preliminary result)   Collection Time: 01/14/22 11:00 PM   Specimen: BLOOD RIGHT HAND  Result Value Ref Range Status   Specimen Description   Final    BLOOD RIGHT HAND BOTTLES DRAWN AEROBIC AND ANAEROBIC   Special Requests   Final    Blood Culture results may not be optimal due to an excessive volume of blood received in culture bottles   Culture   Final    NO GROWTH < 12 HOURS Performed at [facility], [address], [place], [place]  [address]    Report Status PENDING  Incomplete  MRSA Next Gen by PCR, Nasal     Status: None   Collection Time: 01/15/22  3:16 AM   Specimen: Nasal Mucosa; Nasal Swab  Result Value Ref Range Status   MRSA by PCR Next Gen NOT DETECTED NOT DETECTED Final    Comment: (NOTE) The GeneXpert MRSA Assay (FDA approved for NASAL specimens only), is one component of a comprehensive MRSA colonization surveillance program. It is not intended to diagnose MRSA infection nor to guide or monitor treatment for MRSA infections. Test performance is not FDA approved in patients less than 54 years old. Performed at [facility], [address] [place], [place] [address]     Radiology Reports DG CHEST PORT 1 VIEW  Result Date: 01/16/2022 CLINICAL DATA:  Shortness of breath EXAM: PORTABLE CHEST 1 VIEW COMPARISON:  Prior chest x-ray 01/14/2022 FINDINGS: Cardiomegaly. Interval increase in veil like opacity overlying the right lung base with increased visibility of the minor fissure. Findings are consistent with developing layering pleural effusion and atelectasis. Persistent dense opacification of the left lung base which is similar compared to prior. Mild vascular congestion without overt edema. No pneumothorax. IMPRESSION: 1. Developing small to moderate right pleural effusion and associated right lower lobe atelectasis. 2. Persistent dense opacification of the left lung base consistent with layering pleural effusion and atelectasis versus infiltrate. Electronically Signed   By: Jacqulynn Cadet M.D.   On: 01/16/2022 08:22    SIGNED: Deatra James, MD, FHM. [facility],  Pager (please use [email or web address] to page/text) Please use Epic Secure Chat for non-urgent communication (7AM-7PM)  If 7PM-7AM, please contact night-coverage [email or web address], 01/16/2022, 11:39 AM

## 2022-01-17 ENCOUNTER — Telehealth: Payer: Self-pay | Admitting: Internal Medicine

## 2022-01-17 ENCOUNTER — Inpatient Hospital Stay (HOSPITAL_COMMUNITY): Payer: Medicaid Other

## 2022-01-17 DIAGNOSIS — I10 Essential (primary) hypertension: Secondary | ICD-10-CM | POA: Diagnosis not present

## 2022-01-17 DIAGNOSIS — A419 Sepsis, unspecified organism: Secondary | ICD-10-CM | POA: Diagnosis not present

## 2022-01-17 DIAGNOSIS — J9601 Acute respiratory failure with hypoxia: Secondary | ICD-10-CM | POA: Diagnosis not present

## 2022-01-17 DIAGNOSIS — J189 Pneumonia, unspecified organism: Secondary | ICD-10-CM | POA: Diagnosis not present

## 2022-01-17 LAB — CBC
HCT: 28.7 % — ABNORMAL LOW (ref 36.0–46.0)
Hemoglobin: 9.1 g/dL — ABNORMAL LOW (ref 12.0–15.0)
MCH: 27.2 pg (ref 26.0–34.0)
MCHC: 31.7 g/dL (ref 30.0–36.0)
MCV: 85.9 fL (ref 80.0–100.0)
Platelets: 397 10*3/uL (ref 150–400)
RBC: 3.34 MIL/uL — ABNORMAL LOW (ref 3.87–5.11)
RDW: 16.1 % — ABNORMAL HIGH (ref 11.5–15.5)
WBC: 9.7 10*3/uL (ref 4.0–10.5)
nRBC: 0 % (ref 0.0–0.2)

## 2022-01-17 LAB — BASIC METABOLIC PANEL
Anion gap: 7 (ref 5–15)
BUN: 18 mg/dL (ref 6–20)
CO2: 26 mmol/L (ref 22–32)
Calcium: 8.6 mg/dL — ABNORMAL LOW (ref 8.9–10.3)
Chloride: 102 mmol/L (ref 98–111)
Creatinine, Ser: 0.59 mg/dL (ref 0.44–1.00)
GFR, Estimated: >60 mL/min (ref >60)
Glucose, Bld: 153 mg/dL — ABNORMAL HIGH (ref 70–99)
Potassium: 3.6 mmol/L (ref 3.5–5.1)
Sodium: 135 mmol/L (ref 135–145)

## 2022-01-17 LAB — LIPID PANEL
Cholesterol: 101 mg/dL (ref 0–200)
HDL: 23 mg/dL — ABNORMAL LOW (ref >40)
LDL Cholesterol: 55 mg/dL (ref 0–99)
Total CHOL/HDL Ratio: 4.4 RATIO
Triglycerides: 115 mg/dL (ref <150)
VLDL: 23 mg/dL (ref 0–40)

## 2022-01-17 LAB — LEGIONELLA PNEUMOPHILA SEROGP 1 UR AG: L. pneumophila Serogp 1 Ur Ag: NEGATIVE

## 2022-01-17 LAB — GLUCOSE, CAPILLARY: Glucose-Capillary: 122 mg/dL — ABNORMAL HIGH (ref 70–99)

## 2022-01-17 MED ORDER — IPRATROPIUM BROMIDE 0.02 % IN SOLN
0.5000 mg | Freq: Three times a day (TID) | RESPIRATORY_TRACT | Status: DC
  Administered 2022-01-18: 0.5 mg via RESPIRATORY_TRACT
  Filled 2022-01-17 (×2): qty 2.5

## 2022-01-17 MED ORDER — HYDRALAZINE HCL 25 MG PO TABS
50.0000 mg | ORAL_TABLET | Freq: Three times a day (TID) | ORAL | Status: DC
  Administered 2022-01-17 – 2022-01-18 (×3): 50 mg via ORAL
  Filled 2022-01-17 (×3): qty 2

## 2022-01-17 MED ORDER — LEVALBUTEROL HCL 0.63 MG/3ML IN NEBU
0.6300 mg | INHALATION_SOLUTION | Freq: Three times a day (TID) | RESPIRATORY_TRACT | Status: DC
  Administered 2022-01-18: 0.63 mg via RESPIRATORY_TRACT
  Filled 2022-01-17: qty 3

## 2022-01-17 MED ORDER — PREDNISONE 20 MG PO TABS
50.0000 mg | ORAL_TABLET | Freq: Every day | ORAL | Status: DC
  Administered 2022-01-18: 50 mg via ORAL
  Filled 2022-01-17: qty 1

## 2022-01-17 MED ORDER — AZITHROMYCIN 250 MG PO TABS
500.0000 mg | ORAL_TABLET | Freq: Every day | ORAL | Status: DC
  Administered 2022-01-17: 500 mg via ORAL
  Filled 2022-01-17: qty 2

## 2022-01-17 NOTE — Progress Notes (Signed)
Patient states she is going to leave, daughter at bedside. Patient states she needs to get home tonight because her home health nurse is coming tomorrow. Patient educated on discharge process and verbalized understanding. Patient's daughter requesting EVS to come clean room, EVS called and will come when available.

## 2022-01-17 NOTE — Telephone Encounter (Signed)
Patient's daughter, Janett Billow, called the office stating Carisa is currently in the hospital and had to cancel Rosaland's upcoming appointment with [facility]. Janett Billow states Avia is currently on antibiotics and would like to know when she can reschedule and how long Clarissa needs to be off the antibiotics for. Please advise.

## 2022-01-17 NOTE — Progress Notes (Signed)
Patient wants to go home. Daughter still is wanting patient transferred to [facility]. Patient's bed request is in for transfer to [facility] but currently is not assigned a bed at this time. Daughter states she will call [facility] to discuss bed placement issues.

## 2022-01-17 NOTE — Consult Note (Signed)
NAME:  [name], MRN:  [id number], DOB:  1965/08/20, LOS: 3 ADMISSION DATE:  01/14/2022, CONSULTATION DATE:  01/17/2022  REFERRING MD:  Gilford Rile  , CHIEF COMPLAINT: Shortness of breath  History of Present Illness:  56 year old smoker admitted 10/14 with a history of shortness of breath for 2 days and chest pressure on the left side, nonradiating especially on lying supine Labs showed WC count of 12 mild hyponatremia 132 and hypokalemia See the angiogram chest was negative for PE, showed bilateral lower lobe consolidation She required 2 L oxygen, lactate was normal, treated with ceftriaxone and azithromycin COVID/flu testing was negative, blood cultures were negative Urine strep antigen was negative PCCM was consulted at family request  Pertinent  Medical History  Rheumatoid arthritis on methotrexate -plan was to start Humira on 10/11 Hypertension Hypothyroidism CVA -left hemiplegia  Significant Hospital Events: Including procedures, antibiotic start and stop dates in addition to other pertinent events     Interim History / Subjective:  Afebrile. Denies chest pain or dyspnea. Saturation 99% on 3 L oxygen, 97% on room air  Objective   Blood pressure (!) 156/80, pulse 84, temperature 98 F (36.7 C), temperature source Oral, resp. rate 19, height 5\' 6"  (1.676 m), weight 127 kg, last menstrual period 07/29/2012, SpO2 97 %.        Intake/Output Summary (Last 24 hours) at 01/17/2022 1313 Last data filed at 01/17/2022 0943 Gross per 24 hour  Intake --  Output 500 ml  Net -500 ml   Filed Weights   01/14/22 2139  Weight: 127 kg    Examination: General: Appears older than stated age, obese, no distress HENT: Mild pallor, no icterus, no JVD Lungs: Clear breath sounds bilateral, no rhonchi, no accessory muscle use Cardiovascular: S1-S2 regular, no murmur Abdomen: Soft, obese, nontender Extremities: No deformity, no edema Neuro: Left hemiparesis, alert,  interactive   Chest x-ray 10/15 independently reviewed shows small right effusion Labs showed normal electrolytes, no leukocytosis, slight drop in hemoglobin to 9.1 Resolved Hospital Problem list     Assessment & Plan:  Community-acquired pneumonia -bibasal Immunocompromised patient, on methotrexate Possible right parapneumonic effusion  -Clinically improved on ceftriaxone/azithromycin -Can complete course of 7 days with Levaquin at discharge -X-ray 10/16 appears improved and doubt significant right effusion  Rheumatoid arthritis -we will have to hold off Humira start until pneumonia fully resolved  Tobacco abuse -strongly counseled about cessation PFTs in the future to rule out COPD  PCCM will be available as needed, outpatient pulmonary office appointment can be made  Best Practice (right click and "Reselect all SmartList Selections" daily)    Code Status:  full code   Labs   CBC: Recent Labs  Lab 01/14/22 2227 01/15/22 0418 01/16/22 0416 01/17/22 0408  WBC 12.0* 9.5 8.7 9.7  NEUTROABS  --  8.1*  --   --   HGB 11.0* 9.9* 8.7* 9.1*  HCT 34.2* 31.4* 27.7* 28.7*  MCV 84.7 86.3 86.8 85.9  PLT 399 339 321 532    Basic Metabolic Panel: Recent Labs  Lab 01/14/22 2300 01/15/22 0418 01/16/22 0416 01/17/22 0408  NA 132* 134* 136 135  K 3.1* 3.2* 3.7 3.6  CL 98 100 103 102  CO2 25 26 26 26   GLUCOSE 127* 112* 153* 153*  BUN 18 16 18 18   CREATININE 0.84 0.70 0.58 0.59  CALCIUM 8.1* 8.2* 8.3* 8.6*  MG  --  2.2  --   --    GFR: Estimated Creatinine Clearance: 108.4 mL/min (  by C-G formula based on SCr of 0.59 mg/dL). Recent Labs  Lab 01/14/22 2227 01/15/22 0045 01/15/22 0418 01/15/22 0810 01/16/22 0416 01/16/22 0730 01/17/22 0408  PROCALCITON  --   --   --  0.77  --  1.13  --   WBC 12.0*  --  9.5  --  8.7  --  9.7  LATICACIDVEN 1.7 1.4  --   --   --   --   --     Liver Function Tests: Recent Labs  Lab 01/14/22 2300 01/15/22 0418  AST 48* 48*  ALT  19 20  ALKPHOS 51 52  BILITOT 1.4* 1.1  PROT 7.7 7.7  ALBUMIN 2.2* 2.2*   Recent Labs  Lab 01/14/22 2300  LIPASE 37   No results for input(s): "AMMONIA" in the last 168 hours.  ABG No results found for: "PHART", "PCO2ART", "PO2ART", "HCO3", "TCO2", "ACIDBASEDEF", "O2SAT"   Coagulation Profile: Recent Labs  Lab 01/14/22 2300  INR 1.4*    Cardiac Enzymes: No results for input(s): "CKTOTAL", "CKMB", "CKMBINDEX", "TROPONINI" in the last 168 hours.  HbA1C: Hgb A1c MFr Bld  Date/Time Value Ref Range Status  08/04/2017 12:07 PM 5.2 <5.7 % of total Hgb Final    Comment:    For the purpose of screening for the presence of diabetes: . <5.7%       Consistent with the absence of diabetes 5.7-6.4%    Consistent with increased risk for diabetes             (prediabetes) > or =6.5%  Consistent with diabetes . This assay result is consistent with a decreased risk of diabetes. . Currently, no consensus exists regarding use of hemoglobin A1c for diagnosis of diabetes in children. . According to American Diabetes Association (ADA) guidelines, hemoglobin A1c <7.0% represents optimal control in non-pregnant diabetic patients. Different metrics may apply to specific patient populations.  Standards of Medical Care in Diabetes(ADA). Marland Kitchen   06/26/2013 05:53 AM 5.2 <5.7 % Final    Comment:    (NOTE)                                                                       According to the ADA Clinical Practice Recommendations for 2011, when HbA1c is used as a screening test:  >=6.5%   Diagnostic of Diabetes Mellitus           (if abnormal result is confirmed) 5.7-6.4%   Increased risk of developing Diabetes Mellitus References:Diagnosis and Classification of Diabetes Mellitus,Diabetes Care,2011,34(Suppl 1):S62-S69 and Standards of Medical Care in         Diabetes - 2011,Diabetes Care,2011,34 (Suppl 1):S11-S61.    CBG: Recent Labs  Lab 01/15/22 0224  GLUCAP 122*    Review of  Systems:   Shortness of breath Chest pain, improved Cough Chronic left hemiparesis, uses Hoveround  Constitutional: negative for anorexia, fevers and sweats  Eyes: negative for irritation, redness and visual disturbance  Ears, nose, mouth, throat, and face: negative for earaches, epistaxis, nasal congestion and sore throat  Cardiovascular: negative for lower extremity edema, orthopnea, palpitations and syncope  Gastrointestinal: negative for abdominal pain, constipation, diarrhea, melena, nausea and vomiting  Genitourinary:negative for dysuria, frequency and hematuria  Hematologic/lymphatic: negative for bleeding, easy bruising  and lymphadenopathy  Musculoskeletal:negative for arthralgias, muscle weakness and stiff joints  Neurological: negative for headaches and weakness  Endocrine: negative for diabetic symptoms including polydipsia, polyuria and weight loss   Past Medical History:  She,  has a past medical history of High cholesterol, Hypertension, Hypothyroidism, Stroke (HCC), and Vaginal Pap smear, abnormal.   Surgical History:   Past Surgical History:  Procedure Laterality Date   DILATION AND CURETTAGE OF UTERUS     LASER ABLATION CONDOLAMATA Bilateral 09/27/2017   Procedure: LASER ABLATION CONDYLOMA AND REMOVAL OF PERIANAL WART;  Surgeon: [name], MD;  Location: [facility];  Service: Gynecology;  Laterality: Bilateral;   MASS EXCISION Left 09/27/2017   Procedure: EXCISION LESION LEFT THIGH;  Surgeon: [name], MD;  Location: [facility];  Service: Gynecology;  Laterality: Left;     Social History:   reports that she has been smoking cigarettes. She has a 16.00 pack-year smoking history. She has never used smokeless tobacco. She reports current drug use. Drug: Marijuana. She reports that she does not drink alcohol.   Family History:  Her family history includes Arrhythmia in her brother and sister; CAD in her brother; HIV in her sister; Heart attack in her father;  Hypertension in her mother; Liver disease in her mother; Mood Disorder in her sister.   Allergies Allergies  Allergen Reactions   Lisinopril Nausea Only     Home Medications  Prior to Admission medications   Medication Sig Start Date End Date Taking? Authorizing Provider  acetaminophen (TYLENOL) 500 MG tablet Take 1,000 mg by mouth every 6 (six) hours as needed for mild pain.   Yes [provider]  amLODipine (NORVASC) 10 MG tablet Take 10 mg by mouth daily.   Yes [provider]  aspirin EC 325 MG tablet Take 1 tablet (325 mg total) by mouth daily. 08/24/17  Yes Hagler, Fleet Contras, MD  atorvastatin (LIPITOR) 80 MG tablet Take 80 mg by mouth daily.   Yes [provider]  levothyroxine (SYNTHROID, LEVOTHROID) 50 MCG tablet Take 1 tablet (50 mcg total) by mouth daily. 06/01/17  Yes Aliene Beams, MD  meclizine (ANTIVERT) 25 MG tablet Take 1 tablet (25 mg total) by mouth 3 (three) times daily as needed for dizziness. 08/04/17  Yes Cyril Mourning A, NP  methotrexate 2.5 MG tablet Take 6 tablets (15 mg total) by mouth once a week. Caution:Chemotherapy. Protect from light. 12/14/21  Yes Rice, Jamesetta Orleans, MD  Multiple Vitamin (MULTIVITAMIN WITH MINERALS) TABS tablet Take 1 tablet by mouth daily.   Yes [provider]  folic acid (FOLVITE) 1 MG tablet Take 1 tablet (1 mg total) by mouth daily. Patient not taking: Reported on 01/15/2022 12/14/21   Fuller Plan, MD  Misc. Devices (CANE) MISC 1 each by Does not apply route as directed. Patient not taking: Reported on 08/31/2021 08/04/17   Aliene Beams, MD  pravastatin (PRAVACHOL) 40 MG tablet Take 1 tablet (40 mg total) by mouth daily. Patient not taking: Reported on 01/15/2022 06/28/13   Oval Linsey, MD  traZODone (DESYREL) 50 MG tablet Take by mouth. Patient not taking: Reported on 01/15/2022 07/07/21   [provider]      Cyril Mourning MD. FCCP. [facility] Pager : [phone]  If no response to pager , please call [phone] until 7 pm After 7:00 pm call Elink  [phone]   01/17/2022

## 2022-01-17 NOTE — Progress Notes (Signed)
[facility] PROGRESS NOTE    Patient: [name]                            PCP: Bernerd Limbo, MD                    DOB: 06-10-1965            DOA: 01/14/2022 VEL:[id number]             DOS: 01/17/2022, 10:49 AM   LOS: 3 days   Date of Service: The patient was seen and examined on 01/17/2022  Subjective:   The patient was seen and examined this morning, laying in bed comfortably, reporting improved shortness of breath at rest With minimal exertion she is still experiencing shortness of breath Currently laying in bed satting 100% on 2 L of oxygen No issues overnight   Brief Narrative:   Damaya Channing is a 56 y.o. female with medical history significant of hyperlipidemia, hypertension, acquired hypothyroidism reportedly no longer on levothyroxine, stroke, tobacco use disorder, and more presents the ED with a chief complaint of dyspnea.  Patient reports that she has had dyspnea that started 2 days ago.  Been progressively worsening.  She denies cough or fever.  She has had chest pain when she is laying flat.  The chest pain is located on her left side and feels like a pressure when she is laying flat.  She does have a left greater than right pleural effusion.  Patient reports that she has been wheezing.  She has no nebulizer and no inhaler at home.  She has not tried anything to improve her breathing.  She reports that her dyspnea is worse on exertion but present at rest as well.  Patient wears no oxygen at baseline.  Patient reports no diagnosis of COPD.  She does smoke half a pack per day.  She request nicotine patch.  She has had Chantix at home but has not started it yet.  She has been advised on the importance of smoking cessation.  Patient has no other complaints at this time.   Patient smokes as discussed above.  She does not drink alcohol.  She does use marijuana and her last use was 1 week ago.  She is vaccinated for COVID.  Patient is full code.  ED Temp 98.5, heart rate 122-1 33,  respiratory rate 28-44, blood pressure 113/73-137/92, satting at 96% on 2 L nasal cannula Leukocytosis at 12.0, hemoglobin 11.0 Chemistry shows a hyponatremia 132, hypokalemia 3.1, low calcium that corrects for albumin, T. bili 1.4 Blood cultures pending Lactic acid 1.7 CTA shows no PE.  Does show bilateral lower lobe consolidation Zithromax Rocephin started LR started 150 mL/h Admission requested for pneumonia and respiratory failure with hypoxia    Assessment & Plan:   Principal Problem:   CAP (community acquired pneumonia) Active Problems:   Essential hypertension   Smoker   History of CVA with residual deficit   Sepsis ([place])   Acute respiratory failure with hypoxia (HCC)   Hypokalemia   Hyponatremia     Assessment and Plan: * CAP (community acquired pneumonia) -Improving -Has been on IV antibiotics of azithromycin and Zosyn, switching antibiotics to p.o. -Blood cultures/sputum cultures no growth to date x3 days now -Unable to obtain sputum cultures yet - CT scan shows bilateral lower lobe consolidation - With hypoxia WAS requiring 2 L of oxygen, satting 100% >> much improved weaning off supplemental  oxygen  - Strep and Legionella urine antigens -no results SARS-CoV-2, influenza A/B all negative  - Continue to monitor  Hyponatremia - Hyponatremia at 132 >134  - Continue normal saline throughout the night   Hypokalemia - 40 mEq of potassium replaced -Monitoring and repleting, along with IV fluids  Acute respiratory failure with hypoxia (HCC) - Much improved satting 100% on 2 L of oxygen  Blood pressure (!) 183/92, pulse 82, temperature (!) 97.5 F (36.4 C), temperature source Oral, resp. rate (!) 21, On 2 L SpO2 100 %.   - POA: Met sepsis criteria due to acute respiratory failure -hypoxia likely due to pneumonia exacerbated by underlying possible COPD  --Wean off supplemental oxygen as patient is satting 100% now -DuoNeb bronchodilators scheduled and as  needed -We will continue with IV steroids>> We will taper down -Continue IV antibiotics for underlying pneumonia  - Continue to monitor -We will need follow-up with pulmonologist once stable formal diagnosis of possible COPD  The patient daughter requested transfer to [facility] bed still not available Pulmonologist Dr. Elsworth Soho consulted... He is to see the patient today and discussed with the family  Sepsis ([place]) -Much improved sepsis physiology  - Met sepsis criteria on admission source of infection pneumonia, with acute respiratory failure heart rate 122-20 33, respiratory rate 28-44, leukocytosis 12.0  Currently stable: Blood pressure (!) 144/77, pulse 91, temperature 98.6 F (37 C),SpO2 100 % on 2 L    - Acute endorgan damage with respiratory failure requiring 2 L nasal cannula - Lactic acid normal 1.7 >>1.4  -Procalcitonin level 0.77 - Blood culture pending - Sputum culture pending - Most likely source is bilateral pneumonia - Strep and Legionella urine antigens pending - Continue to monitor  History of CVA with residual deficit - Continue aspirin and statin - Continue to monitor  Smoker - Smokes half a pack per day - Has Chantix at home but has not yet started it - Requests nicotine patch in the hospital - 14 mg nicotine patch ordered, patient counseled on importance of cessation, patient counseled on likelihood of having COPD and how smoking can lead to further compromise/damage  Essential hypertension - Blood pressure has improved,  -Hypertensive this a.m., blood pressure 183/92 -Home medication of Norvasc -Anticipating adding this new medication for better BP control -As needed IV hydralazine   ----------------------------------------------------------------------------------------------------------------------------------------------- Nutritional status:  The patient's BMI is: Body mass index is 45.19 kg/m. I agree with the assessment and plan as outlined    Cultures; Blood Cultures x 2 >> Sputum Culture >>     ------------------------------------------------------------------------------------------------------------------------------------------------  DVT prophylaxis:  heparin injection 5,000 Units Start: 01/17/22 0600 Place and maintain sequential compression device Start: 01/16/22 0653 SCDs Start: 01/15/22 0146   Code Status:   Code Status: Full Code  Family Communication: Husband at bedside-updated The above findings and plan of care has been discussed with patient (and family)  in detail,  they expressed understanding and agreement of above. -Advance care planning has been discussed.   Admission status:   Status is: Inpatient Remains inpatient appropriate because: Treating acute respiratory failure likely due to pneumonia new diagnosis COPD     Procedures:   No admission procedures for hospital encounter.   Antimicrobials:  Anti-infectives (From admission, onward)    Start     Dose/Rate Route Frequency Ordered Stop   01/17/22 2200  azithromycin (ZITHROMAX) tablet 500 mg        500 mg Oral Daily at 10 pm 01/17/22 1006 01/19/22 2159  01/14/22 2215  cefTRIAXone (ROCEPHIN) 2 g in sodium chloride 0.9 % 100 mL IVPB        2 g 200 mL/hr over 30 Minutes Intravenous Every 24 hours 01/14/22 2208 01/19/22 2214   01/14/22 2215  azithromycin (ZITHROMAX) 500 mg in sodium chloride 0.9 % 250 mL IVPB  Status:  Discontinued        500 mg 250 mL/hr over 60 Minutes Intravenous Every 24 hours 01/14/22 2208 01/17/22 1006        Medication:   acidophilus  1 capsule Oral TID WC   amLODipine  10 mg Oral Daily   aspirin EC  325 mg Oral Daily   atorvastatin  80 mg Oral Daily   azithromycin  500 mg Oral Q2200   Chlorhexidine Gluconate Cloth  6 each Topical Daily   guaiFENesin-dextromethorphan  10 mL Oral Q8H   heparin  5,000 Units Subcutaneous Q8H   ipratropium  0.5 mg Nebulization Q6H WA   levalbuterol  0.63 mg Nebulization  Q6H WA   methylPREDNISolone (SOLU-MEDROL) injection  40 mg Intravenous Q12H   nicotine  14 mg Transdermal Daily    ipratropium-albuterol   Objective:   Vitals:   01/17/22 0803 01/17/22 0900 01/17/22 1000 01/17/22 1011  BP:  (!) 155/86 (!) 183/92   Pulse:   82   Resp:  (!) 21    Temp:      TempSrc:      SpO2: 100%   100%  Weight:      Height:        Intake/Output Summary (Last 24 hours) at 01/17/2022 1049 Last data filed at 01/17/2022 0943 Gross per 24 hour  Intake --  Output 500 ml  Net -500 ml   Filed Weights   01/14/22 2139  Weight: 127 kg     Examination:     General:  AAO x 3,  cooperative, no distress;   HEENT:  Normocephalic, PERRL, otherwise with in Normal limits   Neuro:  CNII-XII intact. , normal motor and sensation, reflexes intact   Lungs:   Improved shortness of breath, denies any cough, positive breath sounds diffusely, with minimum wheezing and no crackles, minimal rhonchi   Cardio:    S1/S2, RRR, No murmure, No Rubs or Gallops   Abdomen:  Soft, non-tender, bowel sounds active all four quadrants, no guarding or peritoneal signs.  Muscular  skeletal:  Limited exam -global generalized weaknesses - in bed, able to move all 4 extremities,   2+ pulses,  symmetric, No pitting edema  Skin:  Dry, warm to touch, negative for any Rashes,  Wounds: Please see nursing documentation            ------------------------------------------------------------------------------------------------------------------------------------------    LABs:     Latest Ref Rng & Units 01/17/2022    4:08 AM 01/16/2022    4:16 AM 01/15/2022    4:18 AM  CBC  WBC 4.0 - 10.5 K/uL 9.7  8.7  9.5   Hemoglobin 12.0 - 15.0 g/dL 9.1  8.7  9.9   Hematocrit 36.0 - 46.0 % 28.7  27.7  31.4   Platelets 150 - 400 K/uL 397  321  339       Latest Ref Rng & Units 01/17/2022    4:08 AM 01/16/2022    4:16 AM 01/15/2022    4:18 AM  CMP  Glucose 70 - 99 mg/dL 153  153  112   BUN 6  - 20 mg/dL 18  18  16    Creatinine  0.44 - 1.00 mg/dL 0.59  0.58  0.70   Sodium 135 - 145 mmol/L 135  136  134   Potassium 3.5 - 5.1 mmol/L 3.6  3.7  3.2   Chloride 98 - 111 mmol/L 102  103  100   CO2 22 - 32 mmol/L 26  26  26    Calcium 8.9 - 10.3 mg/dL 8.6  8.3  8.2   Total Protein 6.5 - 8.1 g/dL   7.7   Total Bilirubin 0.3 - 1.2 mg/dL   1.1   Alkaline Phos 38 - 126 U/L   52   AST 15 - 41 U/L   48   ALT 0 - 44 U/L   20        Micro Results Recent Results (from the past 240 hour(s))  Resp Panel by RT-PCR (Flu A&B, Covid) Anterior Nasal Swab     Status: None   Collection Time: 01/14/22 10:27 PM   Specimen: Anterior Nasal Swab  Result Value Ref Range Status   SARS Coronavirus 2 by RT PCR NEGATIVE NEGATIVE Final    Comment: (NOTE) SARS-CoV-2 target nucleic acids are NOT DETECTED.  The SARS-CoV-2 RNA is generally detectable in upper respiratory specimens during the acute phase of infection. The lowest concentration of SARS-CoV-2 viral copies this assay can detect is 138 copies/mL. A negative result does not preclude SARS-Cov-2 infection and should not be used as the sole basis for treatment or other patient management decisions. A negative result may occur with  improper specimen collection/handling, submission of specimen other than nasopharyngeal swab, presence of viral mutation(s) within the areas targeted by this assay, and inadequate number of viral copies(<138 copies/mL). A negative result must be combined with clinical observations, patient history, and epidemiological information. The expected result is Negative.  Fact Sheet for Patients:  EntrepreneurPulse.com.au  Fact Sheet for Healthcare Providers:  IncredibleEmployment.be  This test is no t yet approved or cleared by the [place] FDA and  has been authorized for detection and/or diagnosis of SARS-CoV-2 by FDA under an Emergency Use Authorization (EUA). This EUA will remain   in effect (meaning this test can be used) for the duration of the COVID-19 declaration under Section 564(b)(1) of the Act, 21 U.S.C.section 360bbb-3(b)(1), unless the authorization is terminated  or revoked sooner.       Influenza A by PCR NEGATIVE NEGATIVE Final   Influenza B by PCR NEGATIVE NEGATIVE Final    Comment: (NOTE) The Xpert Xpress SARS-CoV-2/FLU/RSV plus assay is intended as an aid in the diagnosis of influenza from Nasopharyngeal swab specimens and should not be used as a sole basis for treatment. Nasal washings and aspirates are unacceptable for Xpert Xpress SARS-CoV-2/FLU/RSV testing.  Fact Sheet for Patients: EntrepreneurPulse.com.au  Fact Sheet for Healthcare Providers: IncredibleEmployment.be  This test is not yet approved or cleared by the [place] FDA and has been authorized for detection and/or diagnosis of SARS-CoV-2 by FDA under an Emergency Use Authorization (EUA). This EUA will remain in effect (meaning this test can be used) for the duration of the COVID-19 declaration under Section 564(b)(1) of the Act, 21 U.S.C. section 360bbb-3(b)(1), unless the authorization is terminated or revoked.  Performed at [facility], [address] [place], [place] [address]   Blood Culture (routine x 2)     Status: None (Preliminary result)   Collection Time: 01/14/22 10:27 PM   Specimen: Right Antecubital; Blood  Result Value Ref Range Status   Specimen Description   Final  RIGHT ANTECUBITAL BOTTLES DRAWN AEROBIC AND ANAEROBIC   Special Requests   Final    Blood Culture results may not be optimal due to an inadequate volume of blood received in culture bottles   Culture   Final    NO GROWTH 3 DAYS Performed at [facility], [address] [place], [place] [address]    Report Status PENDING  Incomplete  Blood Culture (routine x 2)     Status: None (Preliminary result)   Collection Time: 01/14/22 11:00 PM   Specimen:  BLOOD RIGHT HAND  Result Value Ref Range Status   Specimen Description   Final    BLOOD RIGHT HAND BOTTLES DRAWN AEROBIC AND ANAEROBIC   Special Requests   Final    Blood Culture results may not be optimal due to an excessive volume of blood received in culture bottles   Culture   Final    NO GROWTH 3 DAYS Performed at [facility], [address], [place], [place] [address]    Report Status PENDING  Incomplete  MRSA Next Gen by PCR, Nasal     Status: None   Collection Time: 01/15/22  3:16 AM   Specimen: Nasal Mucosa; Nasal Swab  Result Value Ref Range Status   MRSA by PCR Next Gen NOT DETECTED NOT DETECTED Final    Comment: (NOTE) The GeneXpert MRSA Assay (FDA approved for NASAL specimens only), is one component of a comprehensive MRSA colonization surveillance program. It is not intended to diagnose MRSA infection nor to guide or monitor treatment for MRSA infections. Test performance is not FDA approved in patients less than 78 years old. Performed at [facility], [address], [place], [place] [address]     Radiology Reports No results found.  SIGNED: Deatra James, MD, FHM. [facility],  Pager (please use [email or web address] to page/text) Please use Epic Secure Chat for non-urgent communication (7AM-7PM)  If 7PM-7AM, please contact night-coverage [email or web address], 01/17/2022, 10:49 AM

## 2022-01-18 DIAGNOSIS — J189 Pneumonia, unspecified organism: Secondary | ICD-10-CM | POA: Diagnosis not present

## 2022-01-18 DIAGNOSIS — A419 Sepsis, unspecified organism: Secondary | ICD-10-CM | POA: Diagnosis not present

## 2022-01-18 DIAGNOSIS — I10 Essential (primary) hypertension: Secondary | ICD-10-CM | POA: Diagnosis not present

## 2022-01-18 DIAGNOSIS — J9601 Acute respiratory failure with hypoxia: Secondary | ICD-10-CM | POA: Diagnosis not present

## 2022-01-18 LAB — CBC
HCT: 30.3 % — ABNORMAL LOW (ref 36.0–46.0)
Hemoglobin: 9.5 g/dL — ABNORMAL LOW (ref 12.0–15.0)
MCH: 27.3 pg (ref 26.0–34.0)
MCHC: 31.4 g/dL (ref 30.0–36.0)
MCV: 87.1 fL (ref 80.0–100.0)
Platelets: 421 10*3/uL — ABNORMAL HIGH (ref 150–400)
RBC: 3.48 MIL/uL — ABNORMAL LOW (ref 3.87–5.11)
RDW: 15.8 % — ABNORMAL HIGH (ref 11.5–15.5)
WBC: 8.7 10*3/uL (ref 4.0–10.5)
nRBC: 0 % (ref 0.0–0.2)

## 2022-01-18 LAB — BASIC METABOLIC PANEL
Anion gap: 7 (ref 5–15)
BUN: 17 mg/dL (ref 6–20)
CO2: 27 mmol/L (ref 22–32)
Calcium: 8.5 mg/dL — ABNORMAL LOW (ref 8.9–10.3)
Chloride: 103 mmol/L (ref 98–111)
Creatinine, Ser: 0.5 mg/dL (ref 0.44–1.00)
GFR, Estimated: >60 mL/min (ref >60)
Glucose, Bld: 93 mg/dL (ref 70–99)
Potassium: 3.4 mmol/L — ABNORMAL LOW (ref 3.5–5.1)
Sodium: 137 mmol/L (ref 135–145)

## 2022-01-18 MED ORDER — LAMIVUDINE-ZIDOVUDINE 150-300 MG PO TABS: 1.0000 | ORAL_TABLET | Freq: Two times a day (BID) | ORAL | 2 refills | Status: AC

## 2022-01-18 MED ORDER — LEVALBUTEROL HCL 0.63 MG/3ML IN NEBU: 0.6300 mg | INHALATION_SOLUTION | Freq: Three times a day (TID) | RESPIRATORY_TRACT | 12 refills | Status: DC

## 2022-01-18 MED ORDER — GUAIFENESIN-DM 100-10 MG/5ML PO SYRP: 10.0000 mL | ORAL_SOLUTION | Freq: Three times a day (TID) | ORAL | 0 refills | Status: DC

## 2022-01-18 MED ORDER — METHYLPREDNISOLONE 4 MG PO TBPK: ORAL_TABLET | ORAL | 0 refills | Status: DC

## 2022-01-18 MED ORDER — LEVOTHYROXINE SODIUM 50 MCG PO TABS: 50.0000 ug | ORAL_TABLET | Freq: Every day | ORAL | 1 refills | Status: DC

## 2022-01-18 MED ORDER — LEVOFLOXACIN 750 MG PO TABS
750.0000 mg | ORAL_TABLET | Freq: Every day | ORAL | Status: DC
  Administered 2022-01-18: 750 mg via ORAL
  Filled 2022-01-18: qty 1

## 2022-01-18 MED ORDER — HYDRALAZINE HCL 50 MG PO TABS: 50.0000 mg | ORAL_TABLET | Freq: Three times a day (TID) | ORAL | 1 refills | Status: DC

## 2022-01-18 MED ORDER — NICOTINE 14 MG/24HR TD PT24: 14.0000 mg | MEDICATED_PATCH | Freq: Every day | TRANSDERMAL | 2 refills | Status: DC

## 2022-01-18 MED ORDER — POTASSIUM CHLORIDE CRYS ER 20 MEQ PO TBCR
40.0000 meq | EXTENDED_RELEASE_TABLET | Freq: Once | ORAL | Status: AC
  Administered 2022-01-18: 40 meq via ORAL
  Filled 2022-01-18: qty 2

## 2022-01-18 MED ORDER — IPRATROPIUM BROMIDE 0.02 % IN SOLN: 0.5000 mg | Freq: Three times a day (TID) | RESPIRATORY_TRACT | 12 refills | Status: DC

## 2022-01-18 MED ORDER — LACTINEX PO CHEW: 1.0000 | CHEWABLE_TABLET | Freq: Three times a day (TID) | ORAL | 0 refills | Status: AC

## 2022-01-18 MED ORDER — LEVOFLOXACIN 750 MG PO TABS: 750.0000 mg | ORAL_TABLET | Freq: Every day | ORAL | 0 refills | Status: AC

## 2022-01-18 NOTE — Progress Notes (Signed)
Spoke with patient regarding nurse calling to  make primary care doctor follow up appointment. Patient stated "my daughter is already handling that." Patient declined this RN's offer to call and make appointment for her.

## 2022-01-18 NOTE — Evaluation (Signed)
Physical Therapy Evaluation Patient Details Name: Hildreth Orsak MRN: [id number] DOB: 1966-01-08 Today's Date: 01/18/2022  History of Present Illness  Kenza Munar is a 56 y.o. female with medical history significant of hyperlipidemia, hypertension, acquired hypothyroidism reportedly no longer on levothyroxine, stroke, tobacco use disorder, and more presents the ED with a chief complaint of dyspnea.  Patient reports that she has had dyspnea that started 2 days ago.  Been progressively worsening.  She denies cough or fever.  She has had chest pain when she is laying flat.  The chest pain is located on her left side and feels like a pressure when she is laying flat.  She does have a left greater than right pleural effusion.  Patient reports that she has been wheezing.  She has no nebulizer and no inhaler at home.  She has not tried anything to improve her breathing.  She reports that her dyspnea is worse on exertion but present at rest as well.  Patient wears no oxygen at baseline.  Patient reports no diagnosis of COPD.  She does smoke half a pack per day.  She request nicotine patch.  She has had Chantix at home but has not started it yet.  She has been advised on the importance of smoking cessation.  Patient has no other complaints at this time.   Clinical Impression  Patient presents with spouse at bedside and agreeable for family training in bed mobility and transfers.  Patient demonstrates slow labored movement for sitting up at bedside with spouse demonstrating good return for providing Min assist for supine to sitting, sit to standing and Min/mod assist for transferring to chair.  Patient able to take a few side steps at bedside with mostly shuffling of LLE due to baseline weakness and has to lean on armrest of chair for support.  Patient tolerated sitting up in chair after therapy.  PLAN:  Patient to be discharged home today and discharged from acute physical therapy to care of nursing for out of bed to  chair as tolerated for length of stay with recommendations stated below         Recommendations for follow up therapy are one component of a multi-disciplinary discharge planning process, led by the attending physician.  Recommendations may be updated based on patient status, additional functional criteria and insurance authorization.  Follow Up Recommendations Home health PT      Assistance Recommended at Discharge Set up Supervision/Assistance  Patient can return home with the following  A lot of help with walking and/or transfers;A lot of help with bathing/dressing/bathroom;Assistance with cooking/housework;Help with stairs or ramp for entrance    Equipment Recommendations None recommended by PT  Recommendations for Other Services       Functional Status Assessment Patient has had a recent decline in their functional status and/or demonstrates limited ability to make significant improvements in function in a reasonable and predictable amount of time     Precautions / Restrictions Precautions Precautions: Fall Restrictions Weight Bearing Restrictions: No      Mobility  Bed Mobility Overal bed mobility: Needs Assistance Bed Mobility: Supine to Sit     Supine to sit: Min assist     General bed mobility comments: increased time, labored movement    Transfers Overall transfer level: Needs assistance Equipment used: 1 person hand held assist, None Transfers: Sit to/from Stand, Bed to chair/wheelchair/BSC Sit to Stand: Min assist, Min guard   Step pivot transfers: Min assist       General transfer  comment: slow labord movement with mostly shuffling steps with LLE    Ambulation/Gait                  Stairs            Wheelchair Mobility    Modified Rankin (Stroke Patients Only)       Balance Overall balance assessment: Needs assistance Sitting-balance support: Feet supported, No upper extremity supported Sitting balance-Leahy Scale:  Fair Sitting balance - Comments: fair/good seated at EOB   Standing balance support: During functional activity, No upper extremity supported Standing balance-Leahy Scale: Poor Standing balance comment: fair/poor leaning on armrest of chair                             Pertinent Vitals/Pain Pain Assessment Pain Assessment: No/denies pain    Home Living Family/patient expects to be discharged to:: Private residence Living Arrangements: Spouse/significant other Available Help at Discharge: Family;Available 24 hours/day Type of Home: House Home Access: Ramped entrance       Home Layout: One level Home Equipment: Electric scooter;Cane - single point;Grab bars - tub/shower;Shower seat;BSC/3in1      Prior Function Prior Level of Function : Needs assist       Physical Assist : Mobility (physical);ADLs (physical) Mobility (physical): Bed mobility;Transfers;Gait;Stairs   Mobility Comments: assisted transfers from bed to scooter, assisted with transferring to shower chair for bathing, uses power scooter for mobility, limited to 3-4 steps during transfers with assistance ADLs Comments: assisted by family     Hand Dominance   Dominant Hand: Right    Extremity/Trunk Assessment   Upper Extremity Assessment Upper Extremity Assessment: Defer to OT evaluation    Lower Extremity Assessment Lower Extremity Assessment: Generalized weakness;RLE deficits/detail;LLE deficits/detail RLE Deficits / Details: grossly 4+/5 RLE Sensation: WNL RLE Coordination: WNL LLE Deficits / Details: grossly -3/5 LLE Sensation: decreased proprioception LLE Coordination: decreased fine motor;decreased gross motor    Cervical / Trunk Assessment Cervical / Trunk Assessment: Normal  Communication   Communication: No difficulties  Cognition Arousal/Alertness: Awake/alert Behavior During Therapy: WFL for tasks assessed/performed Overall Cognitive Status: Within Functional Limits for tasks  assessed                                          General Comments      Exercises     Assessment/Plan    PT Assessment All further PT needs can be met in the next venue of care  PT Problem List Decreased strength;Decreased activity tolerance;Decreased balance;Decreased mobility       PT Treatment Interventions      PT Goals (Current goals can be found in the Care Plan section)  Acute Rehab PT Goals Patient Stated Goal: return home with family to assist PT Goal Formulation: With patient/family Time For Goal Achievement: 01/18/22 Potential to Achieve Goals: Good    Frequency       Co-evaluation               AM-PAC PT "6 Clicks" Mobility  Outcome Measure                  End of Session   Activity Tolerance: Patient tolerated treatment well;Patient limited by fatigue Patient left: with call bell/phone within reach;in chair;with family/visitor present Nurse Communication: Mobility status PT Visit Diagnosis: Other abnormalities of gait and mobility (  R26.89);Muscle weakness (generalized) (M62.81);Unsteadiness on feet (R26.81)    Time: 1901-2224 PT Time Calculation (min) (ACUTE ONLY): 29 min   Charges:   PT Evaluation $PT Eval Moderate Complexity: 1 Mod PT Treatments $Therapeutic Activity: 23-37 mins        11:23 AM, 01/18/22 Lonell Grandchild, MPT Physical Therapist with [facility] [phone] office [phone] mobile phone

## 2022-01-18 NOTE — Discharge Summary (Signed)
Physician Discharge Summary   Patient: [name] MRN: [id number] DOB: April 28, 1965  Admit date:     01/14/2022  Discharge date: 01/18/22  Discharge Physician: Deatra James   PCP: Bernerd Limbo, MD   Recommendations at discharge:  Continue recommended medications, DuoNeb breathing treatments Continue using inspirometer and flutter valve Continue with home PT -Home health has been arranged Fall precautions Follow-up with PCP in 2-3 weeks  Discharge Diagnoses: Principal Problem:   CAP (community acquired pneumonia) Active Problems:   Essential hypertension   Smoker   History of CVA with residual deficit   Sepsis ([place])   Acute respiratory failure with hypoxia ([facility])   Hypokalemia   Hyponatremia  Resolved Problems:   * No resolved hospital problems. *  Hospital Course: [name] is a 56 y.o. female with medical history significant of hyperlipidemia, hypertension, acquired hypothyroidism reportedly no longer on levothyroxine, stroke, tobacco use disorder, and more presents the ED with a chief complaint of dyspnea.  Patient reports that she has had dyspnea that started 2 days ago.  Been progressively worsening.  She denies cough or fever.  She has had chest pain when she is laying flat.  The chest pain is located on her left side and feels like a pressure when she is laying flat.  She does have a left greater than right pleural effusion.  Patient reports that she has been wheezing.  She has no nebulizer and no inhaler at home.  She has not tried anything to improve her breathing.  She reports that her dyspnea is worse on exertion but present at rest as well.  Patient wears no oxygen at baseline.  Patient reports no diagnosis of COPD.  She does smoke half a pack per day.  She request nicotine patch.  She has had Chantix at home but has not started it yet.  She has been advised on the importance of smoking cessation.  Patient has no other complaints at this time.   Patient smokes as  discussed above.  She does not drink alcohol.  She does use marijuana and her last use was 1 week ago.  She is vaccinated for COVID.  Patient is full code.  ED Temp 98.5, heart rate 122-1 33, respiratory rate 28-44, blood pressure 113/73-137/92, satting at 96% on 2 L nasal cannula Leukocytosis at 12.0, hemoglobin 11.0 Chemistry shows a hyponatremia 132, hypokalemia 3.1, low calcium that corrects for albumin, T. bili 1.4 Blood cultures pending Lactic acid 1.7 CTA shows no PE.  Does show bilateral lower lobe consolidation Zithromax Rocephin started LR started 150 mL/h Admission requested for pneumonia and respiratory failure with hypoxia  Assessment and Plan: * CAP (community acquired pneumonia) -Improving -Has been on IV antibiotics of azithromycin and Zosyn, switching antibiotics to p.o. Levaquin -  -Blood cultures/sputum cultures no growth to date x4 days now - CT scan shows bilateral lower lobe consolidation - With hypoxia WAS requiring 2 L of oxygen, satting 100% >> much improved --- been off oxygen, on room air now  - Strep and Legionella urine antigens -no results SARS-CoV-2, influenza A/B all negative  - Continue to monitor  Hyponatremia - Hyponatremia at 132 >134  - Resolved   Hypokalemia - 40 mEq of potassium replaced   Acute respiratory failure with hypoxia ([facility]) -Much improved successfully off oxygen, satting greater 97% on room air    - POA: Met sepsis criteria due to acute respiratory failure -hypoxia likely due to pneumonia exacerbated by underlying possible COPD  --Wean off  supplemental oxygen as patient is satting 100% now -DuoNeb bronchodilators scheduled and as needed -We will continue with IV steroids>> We will taper down -Continue IV antibiotics for underlying pneumonia  - Continue to monitor -We will need follow-up with pulmonologist once stable formal diagnosis of possible COPD   Pulmonologist Dr. Elsworth Soho consulted... Seen and eval the patient,  will follow the patient as an outpatient  Sepsis ([place]) -Resolved sepsis physiology   - Met sepsis criteria on admission source of infection pneumonia, with acute respiratory failure heart rate 122-20 33, respiratory rate 28-44, leukocytosis 12.0  Afebrile, mildly hypertensive, on room air, satting greater 97%  - Acute endorgan damage with respiratory failure requiring 2 L nasal cannula - Lactic acid normal 1.7 >>1.4  -Procalcitonin level 0.77 - Blood culture --- no growth to date - Sputum culture -no results - Most likely source is bilateral pneumonia - Strep and Legionella urine antigens -no results   History of CVA with residual deficit - Continue aspirin and statin - Health PT ordered  Smoker - Smokes half a pack per day - Has Chantix at home but has not yet started it - Requests nicotine patch in the hospital - 14 mg nicotine patch ordered, patient counseled on importance of cessation, patient counseled on likelihood of having COPD and how smoking can lead to further compromise/damage  Essential hypertension - Blood pressure has improved,  -Hypertensive -Home medication of Norvasc -P.o. hydralazine was added        Consultants: Pulmonologist Dr. Elsworth Soho Procedures performed: none   Disposition: Home health Diet recommendation:  Discharge Diet Orders (From admission, onward)     Start     Ordered   01/18/22 0000  Diet - low sodium heart healthy        01/18/22 1019           Cardiac and Carb modified diet DISCHARGE MEDICATION: Allergies as of 01/18/2022       Reactions   Lisinopril Nausea Only        Medication List     STOP taking these medications    Cane Misc   folic acid 1 MG tablet Commonly known as: FOLVITE   meclizine 25 MG tablet Commonly known as: ANTIVERT   pravastatin 40 MG tablet Commonly known as: Pravachol   predniSONE 10 MG tablet Commonly known as: DELTASONE   traZODone 50 MG tablet Commonly known as: DESYREL        TAKE these medications    acetaminophen 500 MG tablet Commonly known as: TYLENOL Take 1,000 mg by mouth every 6 (six) hours as needed for mild pain.   amLODipine 10 MG tablet Commonly known as: NORVASC Take 10 mg by mouth daily.   aspirin EC 325 MG tablet Take 1 tablet (325 mg total) by mouth daily.   atorvastatin 80 MG tablet Commonly known as: LIPITOR Take 80 mg by mouth daily.   guaiFENesin-dextromethorphan 100-10 MG/5ML syrup Commonly known as: ROBITUSSIN DM Take 10 mLs by mouth every 8 (eight) hours.   hydrALAZINE 50 MG tablet Commonly known as: APRESOLINE Take 1 tablet (50 mg total) by mouth every 8 (eight) hours.   ipratropium 0.02 % nebulizer solution Commonly known as: ATROVENT Take 2.5 mLs (0.5 mg total) by nebulization 3 (three) times daily.   lactobacillus acidophilus & bulgar chewable tablet Chew 1 tablet by mouth 3 (three) times daily with meals for 5 days.   lamiVUDine-zidovudine 150-300 MG tablet Commonly known as: Combivir Take 1 tablet by mouth 2 (two) times  daily.   levalbuterol 0.63 MG/3ML nebulizer solution Commonly known as: XOPENEX Take 3 mLs (0.63 mg total) by nebulization 3 (three) times daily.   levofloxacin 750 MG tablet Commonly known as: LEVAQUIN Take 1 tablet (750 mg total) by mouth daily for 5 days. Start taking on: January 19, 2022   levothyroxine 50 MCG tablet Commonly known as: SYNTHROID Take 1 tablet (50 mcg total) by mouth daily.   methotrexate 2.5 MG tablet Commonly known as: RHEUMATREX Take 6 tablets (15 mg total) by mouth once a week. Caution:Chemotherapy. Protect from light.   methylPREDNISolone 4 MG Tbpk tablet Commonly known as: MEDROL DOSEPAK Medrol Dosepak take as instructed   multivitamin with minerals Tabs tablet Take 1 tablet by mouth daily.   nicotine 14 mg/24hr patch Commonly known as: NICODERM CQ - dosed in mg/24 hours Place 1 patch (14 mg total) onto the skin daily. Start taking on: January 19, 2022               Durable Medical Equipment  (From admission, onward)           Start     Ordered   01/18/22 0000  For home use only DME Nebulizer machine       Question Answer Comment  Patient needs a nebulizer to treat with the following condition COPD exacerbation ([place])   Length of Need 12 Months      01/18/22 1019            Discharge Exam: Filed Weights   01/14/22 2139  Weight: 127 kg      Physical Exam:   General:  AAO x 3,  cooperative, no distress;   HEENT:  Normocephalic, PERRL, otherwise with in Normal limits   Neuro:  CNII-XII intact. , normal motor and sensation, reflexes intact   Lungs:   Clear to auscultation BL, Respirations unlabored,  No wheezes / crackles  Cardio:    S1/S2, RRR, No murmure, No Rubs or Gallops   Abdomen:  soft, non-tender, bowel sounds active all four quadrants, no guarding or peritoneal signs.  Muscular  skeletal:  Left sided weakness, no changes (due to previous stroke )  Gait disturbances, ambulate with assist only Limited exam -global generalized weaknesses - in bed, able to move all 4 extremities,   2+ pulses,  symmetric, No pitting edema  Skin:  Dry, warm to touch, negative for any Rashes,  Wounds: Please see nursing documentation          Condition at discharge: fair  The results of significant diagnostics from this hospitalization (including imaging, microbiology, ancillary and laboratory) are listed below for reference.   Imaging Studies: DG CHEST PORT 1 VIEW  Result Date: 01/17/2022 CLINICAL DATA:  Pneumonia. EXAM: PORTABLE CHEST 1 VIEW COMPARISON:  January 16, 2022. FINDINGS: Stable cardiomegaly. Stable left basilar atelectasis or infiltrate is noted with associated pleural effusion. Stable mild right basilar atelectasis or infiltrate is noted as well. Bony thorax is unremarkable. IMPRESSION: Stable bibasilar opacities as described above. Electronically Signed   By: Marijo Conception M.D.   On:  01/17/2022 14:09   DG CHEST PORT 1 VIEW  Result Date: 01/16/2022 CLINICAL DATA:  Shortness of breath EXAM: PORTABLE CHEST 1 VIEW COMPARISON:  Prior chest x-ray 01/14/2022 FINDINGS: Cardiomegaly. Interval increase in veil like opacity overlying the right lung base with increased visibility of the minor fissure. Findings are consistent with developing layering pleural effusion and atelectasis. Persistent dense opacification of the left lung base which is similar  compared to prior. Mild vascular congestion without overt edema. No pneumothorax. IMPRESSION: 1. Developing small to moderate right pleural effusion and associated right lower lobe atelectasis. 2. Persistent dense opacification of the left lung base consistent with layering pleural effusion and atelectasis versus infiltrate. Electronically Signed   By: Jacqulynn Cadet M.D.   On: 01/16/2022 08:22   CT Angio Chest PE W and/or Wo Contrast  Result Date: 01/14/2022 CLINICAL DATA:  This for 2 days with shortness of breath, initial encounter EXAM: CT ANGIOGRAPHY CHEST WITH CONTRAST TECHNIQUE: Multidetector CT imaging of the chest was performed using the standard protocol during bolus administration of intravenous contrast. Multiplanar CT image reconstructions and MIPs were obtained to evaluate the vascular anatomy. RADIATION DOSE REDUCTION: This exam was performed according to the departmental dose-optimization program which includes automated exposure control, adjustment of the mA and/or kV according to patient size and/or use of iterative reconstruction technique. CONTRAST:  149m OMNIPAQUE IOHEXOL 350 MG/ML SOLN COMPARISON:  Chest x-ray from earlier in the same day. FINDINGS: Cardiovascular: Thoracic aorta demonstrates atherosclerotic calcifications without aneurysmal dilatation or dissection. Mild cardiac enlargement is seen. Small pericardial effusion is noted measuring 1 cm in its greatest thickness. Coronary calcifications are noted. The pulmonary  artery shows a normal branching pattern bilaterally. No filling defect to suggest pulmonary embolism is noted. Mediastinum/Nodes: Thoracic inlet is within normal limits. No sizable hilar or mediastinal adenopathy is noted. The esophagus as visualized is within normal limits. Lungs/Pleura: Lungs show bilateral pleural effusions right slightly greater than left with lower lobe consolidation identified. No sizable parenchymal nodule is noted. Upper Abdomen: Visualized upper abdomen demonstrates mild fatty infiltration of the liver. Musculoskeletal: Degenerative change of the thoracic spine is seen. No acute rib abnormality is noted. Review of the MIP images confirms the above findings. IMPRESSION: No evidence of pulmonary emboli. Bilateral lower lobe consolidation with associated effusions left greater than right. Fatty liver. Mild pericardial effusion of unknown chronicity. Aortic Atherosclerosis (ICD10-I70.0). Electronically Signed   By: MInez CatalinaM.D.   On: 01/14/2022 23:03   DG Chest Portable 1 View  Result Date: 01/14/2022 CLINICAL DATA:  Shortness of breath, tachycardia.  History of CVA. EXAM: PORTABLE CHEST 1 VIEW COMPARISON:  Chest radiograph dated June 26, 2013 FINDINGS: The heart is enlarged. Atherosclerotic calcification of the aortic arch. Right lung is clear. Left basilar atelectasis or infiltrate. No acute osseous abnormality. IMPRESSION: 1. Cardiomegaly. 2. Left basilar atelectasis or infiltrate. Electronically Signed   By: IKeane PoliceD.O.   On: 01/14/2022 22:19    Microbiology: Results for orders placed or performed during the hospital encounter of 01/14/22  Resp Panel by RT-PCR (Flu A&B, Covid) Anterior Nasal Swab     Status: None   Collection Time: 01/14/22 10:27 PM   Specimen: Anterior Nasal Swab  Result Value Ref Range Status   SARS Coronavirus 2 by RT PCR NEGATIVE NEGATIVE Final    Comment: (NOTE) SARS-CoV-2 target nucleic acids are NOT DETECTED.  The SARS-CoV-2 RNA is  generally detectable in upper respiratory specimens during the acute phase of infection. The lowest concentration of SARS-CoV-2 viral copies this assay can detect is 138 copies/mL. A negative result does not preclude SARS-Cov-2 infection and should not be used as the sole basis for treatment or other patient management decisions. A negative result may occur with  improper specimen collection/handling, submission of specimen other than nasopharyngeal swab, presence of viral mutation(s) within the areas targeted by this assay, and inadequate number of viral copies(<138 copies/mL). A negative  result must be combined with clinical observations, patient history, and epidemiological information. The expected result is Negative.  Fact Sheet for Patients:  EntrepreneurPulse.com.au  Fact Sheet for Healthcare Providers:  IncredibleEmployment.be  This test is no t yet approved or cleared by the [place] FDA and  has been authorized for detection and/or diagnosis of SARS-CoV-2 by FDA under an Emergency Use Authorization (EUA). This EUA will remain  in effect (meaning this test can be used) for the duration of the COVID-19 declaration under Section 564(b)(1) of the Act, 21 U.S.C.section 360bbb-3(b)(1), unless the authorization is terminated  or revoked sooner.       Influenza A by PCR NEGATIVE NEGATIVE Final   Influenza B by PCR NEGATIVE NEGATIVE Final    Comment: (NOTE) The Xpert Xpress SARS-CoV-2/FLU/RSV plus assay is intended as an aid in the diagnosis of influenza from Nasopharyngeal swab specimens and should not be used as a sole basis for treatment. Nasal washings and aspirates are unacceptable for Xpert Xpress SARS-CoV-2/FLU/RSV testing.  Fact Sheet for Patients: EntrepreneurPulse.com.au  Fact Sheet for Healthcare Providers: IncredibleEmployment.be  This test is not yet approved or cleared by the [place] FDA and has been authorized for detection and/or diagnosis of SARS-CoV-2 by FDA under an Emergency Use Authorization (EUA). This EUA will remain in effect (meaning this test can be used) for the duration of the COVID-19 declaration under Section 564(b)(1) of the Act, 21 U.S.C. section 360bbb-3(b)(1), unless the authorization is terminated or revoked.  Performed at [facility], [address], [place], [place] [address]   Blood Culture (routine x 2)     Status: None (Preliminary result)   Collection Time: 01/14/22 10:27 PM   Specimen: Right Antecubital; Blood  Result Value Ref Range Status   Specimen Description   Final    RIGHT ANTECUBITAL BOTTLES DRAWN AEROBIC AND ANAEROBIC   Special Requests   Final    Blood Culture results may not be optimal due to an inadequate volume of blood received in culture bottles   Culture   Final    NO GROWTH 3 DAYS Performed at [facility], [address] [place], [place] [address]    Report Status PENDING  Incomplete  Blood Culture (routine x 2)     Status: None (Preliminary result)   Collection Time: 01/14/22 11:00 PM   Specimen: BLOOD RIGHT HAND  Result Value Ref Range Status   Specimen Description   Final    BLOOD RIGHT HAND BOTTLES DRAWN AEROBIC AND ANAEROBIC   Special Requests   Final    Blood Culture results may not be optimal due to an excessive volume of blood received in culture bottles   Culture   Final    NO GROWTH 3 DAYS Performed at [facility], [address], [place], [place] [address]    Report Status PENDING  Incomplete  MRSA Next Gen by PCR, Nasal     Status: None   Collection Time: 01/15/22  3:16 AM   Specimen: Nasal Mucosa; Nasal Swab  Result Value Ref Range Status   MRSA by PCR Next Gen NOT DETECTED NOT DETECTED Final    Comment: (NOTE) The GeneXpert MRSA Assay (FDA approved for NASAL specimens only), is one component of a comprehensive MRSA colonization surveillance program. It is not intended to diagnose MRSA  infection nor to guide or monitor treatment for MRSA infections. Test performance is not FDA approved in patients less than 49 years old. Performed at [facility], [address], [place],  [place] [address]     Labs: CBC: Recent Labs  Lab 01/14/22 2227 01/15/22 0418 01/16/22 0416 01/17/22 0408 01/18/22 0324  WBC 12.0* 9.5 8.7 9.7 8.7  NEUTROABS  --  8.1*  --   --   --   HGB 11.0* 9.9* 8.7* 9.1* 9.5*  HCT 34.2* 31.4* 27.7* 28.7* 30.3*  MCV 84.7 86.3 86.8 85.9 87.1  PLT 399 339 321 397 718*   Basic Metabolic Panel: Recent Labs  Lab 01/14/22 2300 01/15/22 0418 01/16/22 0416 01/17/22 0408 01/18/22 0324  NA 132* 134* 136 135 137  K 3.1* 3.2* 3.7 3.6 3.4*  CL 98 100 103 102 103  CO2 _0 GLUCOSE 127* 112* 153* 153* 93  BUN _1 CREATININE 0.84 0.70 0.58 0.59 0.50  CALCIUM 8.1* 8.2* 8.3* 8.6* 8.5*  MG  --  2.2  --   --   --    Liver Function Tests: Recent Labs  Lab 01/14/22 2300 01/15/22 0418  AST 48* 48*  ALT 19 20  ALKPHOS 51 52  BILITOT 1.4* 1.1  PROT 7.7 7.7  ALBUMIN 2.2* 2.2*   CBG: Recent Labs  Lab 01/15/22 0224  GLUCAP 122*    Discharge time spent: greater than 45  minutes.  Signed: Deatra James, MD [facility] 01/18/2022

## 2022-01-18 NOTE — Progress Notes (Signed)
Discharge instructions given to and reviewed with patient and her husband. Patient and husband verbalized understanding of all discharge instructions including follow up care, new prescriptions, home health and nebulizer to be delivered. Patient left ICU by wheelchair with Jinny Blossom and Caryl Pina NTs and husband. Patient alert with no distress noted when leaving ICU. Patient's husband driving her home.

## 2022-01-18 NOTE — TOC Transition Note (Signed)
Transition of Care (TOC) - CM/SW Discharge Note   Patient Details  Name: [name] MRN: [id number] Date of Birth: 04/25/1965  Transition of Care (TOC) CM/SW Contact:   , LCSW Phone Number: 01/18/2022, 11:50 AM   Clinical Narrative:     Pt admitted from home. Plan is for dc home today. MD ordered a neb machine and HHPT. Spoke with pt to review dc planning. Pt states she does not need TOC to arrange HH and that she's got her needs met. Pt agreeable to Neb machine. She requests delivery to her home. CMS provider options reviewed. Referred to [facility] and they will deliver today once pt home.  No other TOC needs for dc.  Expected Discharge Plan: Home/Self Care Barriers to Discharge: Barriers Resolved   Patient Goals and CMS Choice Patient states their goals for this hospitalization and ongoing recovery are:: go home CMS Medicare.gov Compare Post Acute Care list provided to:: Patient Choice offered to / list presented to : Patient  Expected Discharge Plan and Services Expected Discharge Plan: Home/Self Care In-house Referral: Clinical Social Work   Post Acute Care Choice: Durable Medical Equipment Living arrangements for the past 2 months: Skilled Nursing Facility Expected Discharge Date: 01/18/22               DME Arranged: Nebulizer machine DME Agency: [facility] Date DME Agency Contacted: 01/18/22   Representative spoke with at DME Agency: Ashley            Prior Living Arrangements/Services Living arrangements for the past 2 months: Skilled Nursing Facility Lives with:: Self Patient language and need for interpreter reviewed:: Yes Do you feel safe going back to the place where you live?: Yes      Need for Family Participation in Patient Care: No (Comment) Care giver support system in place?: Yes (comment)   Criminal Activity/Legal Involvement Pertinent to Current Situation/Hospitalization: No - Comment as needed  Activities of Daily Living Home  Assistive Devices/Equipment: Wheelchair ADL Screening (condition at time of admission) Patient's cognitive ability adequate to safely complete daily activities?: No Is the patient deaf or have difficulty hearing?: No Does the patient have difficulty seeing, even when wearing glasses/contacts?: No Does the patient have difficulty concentrating, remembering, or making decisions?: No Patient able to express need for assistance with ADLs?: No Does the patient have difficulty dressing or bathing?: No Independently performs ADLs?: No Does the patient have difficulty walking or climbing stairs?: Yes Weakness of Legs: Left Weakness of Arms/Hands: Left  Permission Sought/Granted Permission sought to share information with : Facility Contact Representative Permission granted to share information with : Yes, Verbal Permission Granted     Permission granted to share info w AGENCY: DME        Emotional Assessment   Attitude/Demeanor/Rapport: Engaged Affect (typically observed): Pleasant Orientation: : Oriented to Self, Oriented to Place, Oriented to  Time, Oriented to Situation Alcohol / Substance Use: Not Applicable Psych Involvement: No (comment)  Admission diagnosis:  CAP (community acquired pneumonia) [J18.9] Pneumonia of both lower lobes due to infectious organism [J18.9] Sepsis, due to unspecified organism, unspecified whether acute organ dysfunction present (HCC) [A41.9] Patient Active Problem List   Diagnosis Date Noted   Acute respiratory failure with hypoxia (HCC) 01/15/2022   Hypokalemia 01/15/2022   Hyponatremia 01/15/2022   CAP (community acquired pneumonia) 01/14/2022   Sepsis (HCC) 01/14/2022   Pain in right knee 12/14/2021   Mild intermittent asthma without complication 07/07/2021   Normocytic anemia 12/02/2020   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/19/2020   High risk medication use 06/19/2020   Vitamin D  deficiency 06/19/2020   Arthritis involving multiple sites 02/18/2018   Hyperlipidemia LDL goal <70 02/16/2018   Class 3 severe obesity due to excess calories with serious comorbidity and body mass index (BMI) of 45.0 to 49.9 in adult (HCC) 02/16/2018   Hemiparesis and alteration of sensations as late effects of stroke (HCC) 02/16/2018   Anogenital (venereal) warts 08/04/2017   Screening for colorectal cancer 08/04/2017   Encounter for gynecological examination with Papanicolaou smear of cervix 08/04/2017   Vertigo 08/04/2017   Vertigo as late effect of stroke 08/04/2017   Smoker 06/01/2017   Acquired hypothyroidism 05/12/2017   Acute CVA (cerebrovascular accident) (HCC) 06/25/2013   Tremor of left hand 06/25/2013   Hemiparesis, left (HCC) 06/25/2013   History of CVA with residual deficit 06/25/2013   Difficulty in walking(719.7) 08/31/2011   Unstable balance 08/31/2011   Weakness of left side of body 08/31/2011   Difficulty walking 08/31/2011   Cerebral infarction (HCC) 04/07/2011   Left hand weakness 04/07/2011   Essential hypertension 04/07/2011   PCP:  Bouska, David, MD Pharmacy:   [id number] - [place], [place] - [address] [place] [address] Phone: [phone] Fax: [phone]     Social Determinants of Health (SDOH) Interventions    Readmission Risk Interventions     No data to display           Final next level of care: Home/Self Care Barriers to Discharge: Barriers Resolved   Patient Goals and CMS Choice Patient states their goals for this hospitalization and ongoing recovery are:: go home CMS Medicare.gov Compare Post Acute Care list provided to:: Patient Choice offered to / list presented to : Patient  Discharge Placement                       Discharge Plan and Services In-house Referral: Clinical Social Work   Post Acute Care Choice: Durable Medical Equipment          DME Arranged: Nebulizer  machine DME Agency: [facility] Date DME Agency Contacted: 01/18/22   Representative spoke with at DME Agency: Ashley            Social Determinants of Health (SDOH) Interventions     Readmission Risk Interventions     No data to display             

## 2022-01-19 ENCOUNTER — Ambulatory Visit: Payer: Medicaid Other | Admitting: Pharmacist

## 2022-01-19 LAB — CULTURE, BLOOD (ROUTINE X 2)
Culture: NO GROWTH
Culture: NO GROWTH

## 2022-01-20 NOTE — Telephone Encounter (Signed)
Rescheduling of Humira new start pending Dr. Marveen Reeks recommendation and review of recent admission  Knox Saliva, PharmD, MPH, BCPS, CPP Clinical Pharmacist (Rheumatology and Pulmonology)

## 2022-01-20 NOTE — Telephone Encounter (Signed)
Routing to Dr. Benjamine Mola for advisement on if okay to proceed with Humira once antibiotics are completed and as long as symptoms have fully resolved. Her recent hospitalization was for bilateral pneumonia; however she met sepsis criteria on admission source of infection pneumonia, with acute respiratory failure heart rate 122-20 33, respiratory rate 28-44, leukocytosis 12.0.  At discharge, patient had resolved sepsis physiology. Sepsis was likely due to pneumonia per notes.  Knox Saliva, PharmD, MPH, BCPS, CPP Clinical Pharmacist (Rheumatology and Pulmonology)

## 2022-01-21 NOTE — Telephone Encounter (Signed)
I think starting Humira at that time should be fine.

## 2022-01-24 NOTE — Telephone Encounter (Signed)
Returned call to patient's daughter, Jessica. Patient will be able to start Humira after she completes antibiotics and all symptoms have resolved. Left VM for daughter requesting return call  Brailon Don, PharmD, MPH, BCPS, CPP Clinical Pharmacist (Rheumatology and Pulmonology) 

## 2022-01-24 NOTE — Telephone Encounter (Signed)
Returned call to patient's daughter, Janett Billow. Patient will be able to start Humira after she completes antibiotics and all symptoms have resolved. Left VM for daughter requesting return call  Knox Saliva, PharmD, MPH, BCPS, CPP Clinical Pharmacist (Rheumatology and Pulmonology)

## 2022-02-02 NOTE — Telephone Encounter (Signed)
Spoke with patient. She has finished antibiotics and states all symptoms have resolved. She requests return call next after 02/07/22 due to her husband's eye procedure. Will f/u  Knox Saliva, PharmD, MPH, BCPS, CPP Clinical Pharmacist (Rheumatology and Pulmonology)

## 2022-02-03 ENCOUNTER — Institutional Professional Consult (permissible substitution): Payer: Medicaid Other | Admitting: Pulmonary Disease

## 2022-02-09 NOTE — Telephone Encounter (Signed)
ATC patient to schedule Humira new start. Phone went straight to VM. Left VM requesting return call  Chesley Mires, PharmD, MPH, BCPS, CPP Clinical Pharmacist (Rheumatology and Pulmonology)

## 2022-02-14 NOTE — Telephone Encounter (Signed)
ATC patient to schedule Humira new start. Unable to reach. Left VM requesting return call  Chesley Mires, PharmD, MPH, BCPS, CPP Clinical Pharmacist (Rheumatology and Pulmonology)

## 2022-02-17 ENCOUNTER — Telehealth: Payer: Self-pay | Admitting: Internal Medicine

## 2022-02-17 NOTE — Telephone Encounter (Signed)
Patients daughter called the office requesting to reschedule an appointment that was cancelled due to Keilany being in the hospital. Appointment was for a Humira new start. Call [name] to reschedule.

## 2022-02-17 NOTE — Telephone Encounter (Signed)
ATC patient schedule new start viist for Humira. Phone went straight to VM. Left VM. Letter sent to home. Closing encounter at this point due to loss to f/u  Chesley Mires, PharmD, MPH, BCPS, CPP Clinical Pharmacist (Rheumatology and Pulmonology)

## 2022-02-21 NOTE — Telephone Encounter (Signed)
Spoke with daughter [name] [phone]) and she conferenced in Mrs. Bultema about scheduling a new start appointment for Humira. Confirmed no infection and no antibiotic use. Scheduled new start appointment for 02/22/22 at 10:30am.  Georgeann Oppenheim [facility] [employer] PharmD Candidate [phone]

## 2022-02-21 NOTE — Telephone Encounter (Signed)
LVM with daughter [name] [phone]) to set up new start appointment for Humira.   Georgeann Oppenheim [employer] PharmD Candidate [phone]

## 2022-02-22 ENCOUNTER — Ambulatory Visit: Payer: Medicaid Other | Admitting: Pharmacist

## 2022-03-01 ENCOUNTER — Other Ambulatory Visit (HOSPITAL_COMMUNITY): Payer: Self-pay

## 2022-03-01 ENCOUNTER — Ambulatory Visit: Payer: Medicaid Other | Attending: Internal Medicine | Admitting: Pharmacist

## 2022-03-01 VITALS — BP 127/79 | HR 125

## 2022-03-01 DIAGNOSIS — Z79899 Other long term (current) drug therapy: Secondary | ICD-10-CM

## 2022-03-01 DIAGNOSIS — M059 Rheumatoid arthritis with rheumatoid factor, unspecified: Secondary | ICD-10-CM

## 2022-03-01 MED ORDER — HUMIRA (2 PEN) 40 MG/0.4ML ~~LOC~~ AJKT
40.0000 mg | AUTO-INJECTOR | SUBCUTANEOUS | 0 refills | Status: DC
  Filled 2022-03-01: qty 6, fill #0
  Filled 2022-03-14: qty 2, 28d supply, fill #0
  Filled 2022-03-31: qty 2, 28d supply, fill #1
  Filled 2022-04-26: qty 2, 28d supply, fill #2

## 2022-03-01 NOTE — Patient Instructions (Addendum)
Your next HUMIRA dose is due on 03/15/2022, 03/29/2022, and every 14 days thereafter  CONTINUE METHOTREXATE  15 mg once weekly with folic acid daily  HOLD HUMIRA and METHOTREXATE if you have signs or symptoms of an infection. You can resume once you feel better or back to your baseline. HOLD HUMIRA and METHOTREXATE if you start antibiotics to treat an infection. HOLD HUMIRA and METHOTREXATE around the time of surgery/procedures. Your surgeon will be able to provide recommendations on when to hold BEFORE and when you are cleared to RESUME.  Pharmacy information: Your prescription will be shipped from [facility]. Their phone number is  [phone] Rochele Raring will call you to schedule the first shipment to your home.  Future refills will be coordinated by the pharmacy.   Labs are due in 1 month then every 3 months. Lab hours are from Monday to Thursday 8am-12:30pm and 1pm-5pm and Friday 8am-12pm. You do not need an appointment if you come for labs during these times.  How to manage an injection site reaction: Remember the 5 C's: COUNTER - leave on the counter at least 30 minutes but up to overnight to bring medication to room temperature. This may help prevent stinging COLD - place something cold (like an ice gel pack or cold water bottle) on the injection site just before cleansing with alcohol. This may help reduce pain CLARITIN - use Claritin (generic name is loratadine) for the first two weeks of treatment or the day of, the day before, and the day after injecting. This will help to minimize injection site reactions CORTISONE CREAM - apply if injection site is irritated and itching CALL ME - if injection site reaction is bigger than the size of your fist, looks infected, blisters, or if you develop hives

## 2022-03-01 NOTE — Progress Notes (Signed)
Pharmacy Note  Subjective:   Patient presents to clinic today with her spouse, Jeneen Rinks, to receive first dose of Humira. Patient currently takes MTX 15 mg PO weekly.  Patient running a fever or have signs/symptoms of infection? No  Patient currently on antibiotics for the treatment of infection? No  Patient have any upcoming invasive procedures/surgeries? No  Objective: CMP     Component Value Date/Time   NA 137 01/18/2022 0324   NA 137 02/10/2021 1441   K 3.4 (L) 01/18/2022 0324   CL 103 01/18/2022 0324   CO2 27 01/18/2022 0324   GLUCOSE 93 01/18/2022 0324   BUN 17 01/18/2022 0324   BUN 12 02/10/2021 1441   CREATININE 0.50 01/18/2022 0324   CREATININE 0.60 12/14/2021 1529   CALCIUM 8.5 (L) 01/18/2022 0324   PROT 7.7 01/15/2022 0418   PROT 9.2 (H) 02/10/2021 1441   ALBUMIN 2.2 (L) 01/15/2022 0418   ALBUMIN 3.5 (L) 02/10/2021 1441   AST 48 (H) 01/15/2022 0418   ALT 20 01/15/2022 0418   ALKPHOS 52 01/15/2022 0418   BILITOT 1.1 01/15/2022 0418   BILITOT 0.4 02/10/2021 1441   GFRNONAA >60 01/18/2022 0324   GFRNONAA 100 07/29/2020 1511   GFRAA 115 07/29/2020 1511    CBC    Component Value Date/Time   WBC 8.7 01/18/2022 0324   RBC 3.48 (L) 01/18/2022 0324   HGB 9.5 (L) 01/18/2022 0324   HGB 10.8 (L) 02/10/2021 1441   HCT 30.3 (L) 01/18/2022 0324   HCT 32.0 (L) 02/10/2021 1441   PLT 421 (H) 01/18/2022 0324   PLT 561 (H) 02/10/2021 1441   MCV 87.1 01/18/2022 0324   MCV 83 02/10/2021 1441   MCH 27.3 01/18/2022 0324   MCHC 31.4 01/18/2022 0324   RDW 15.8 (H) 01/18/2022 0324   RDW 15.8 (H) 02/10/2021 1441   LYMPHSABS 1.1 01/15/2022 0418   LYMPHSABS 0.7 02/10/2021 1441   MONOABS 0.2 01/15/2022 0418   EOSABS 0.0 01/15/2022 0418   EOSABS 0.0 02/10/2021 1441   BASOSABS 0.0 01/15/2022 0418   BASOSABS 0.0 02/10/2021 1441    Baseline Immunosuppressant Therapy Labs TB GOLD    Latest Ref Rng & Units 12/14/2021    3:29 PM  Quantiferon TB Gold  Quantiferon TB Gold Plus  NEGATIVE NEGATIVE    Hepatitis Panel    Latest Ref Rng & Units 06/19/2020   11:24 AM  Hepatitis  Hep B Surface Ag NON-REACTI REACTIVE   Hep B IgM NON-REACTI NON-REACTIVE   Hep C Ab NON-REACTI NON-REACTIVE   Hep A IgM NON-REACTI NON-REACTIVE    HIV Lab Results  Component Value Date   HIV Non Reactive 01/14/2022   HIV NON REACTIVE 09/26/2017   HIV NON REACTIVE 06/25/2013   Immunoglobulins   SPEP    Latest Ref Rng & Units 01/15/2022    4:18 AM  Serum Protein Electrophoresis  Total Protein 6.5 - 8.1 g/dL 7.7    G6PD No results found for: "G6PDH" TPMT No results found for: "TPMT"   Chest x-ray: 01/17/2022 -- stable cardiomegaly, stable right and left basilar atelectasis/infiltrates  Assessment/Plan:  Counseled patient that Humira is a TNF blocking agent.  Counseled patient on purpose, proper use, and adverse effects of Humira.  Reviewed the most common adverse effects including infections, headache, and injection site reactions. Discussed that there is the possibility of an increased risk of malignancy including non-melanoma skin cancer but it is not well understood if this increased risk is due to the medication or  the disease state.  Advised patient to get yearly dermatology exams due to risk of skin cancer. Counseled patient that Humira should be held prior to scheduled surgery.   Provided patient with medication education material and answered all questions.  Reviewed storage instructions of Humira.    Reviewed importance of holding Humira with signs/symptoms of an infections, if antibiotics are prescribed to treat an active infection, and with invasive procedures  Due to overall lack of dexterity, patient's husband held the device and the patient initiated the injection using the autoinjector trigger. They demonstrated proper injection technique with Humira demo device.  Patient and patient's husband were able to demonstrate proper injection technique using the teach back  method.  Patient and patient's husband injected in the right lower abdomen with:  Sample Medication: Humira 40mg /0.68mL NDC: 11m Lot: [id number] Expiration: 11/02/2022  Patient tolerated well.  Observed for 30 mins in office for adverse reaction, no redness, itching, nor irritation were seen.   Patient is to return in 1 month for labs and 6-8 weeks for follow-up appointment.  Standing orders placed.   Referral placed with [facility] for yearly skin checks while on Humira due to risk for non-melanoma skin cancer.  Humira approved through insurance .   Rx sent to: [facility]: [phone] .  Patient provided with pharmacy phone number and advised that [facility] will reach out at a later date to schedule shipment to home.  Patient will continue Humira every 14 days in combination with MTX 15 mg PO weekly.  All questions encouraged and answered.  Instructed patient to call with any further questions or concerns.  [phone] Georgeann Oppenheim of Pharmacy PharmD Candidate [phone] 03/01/2022 10:53 AM

## 2022-03-02 ENCOUNTER — Other Ambulatory Visit (HOSPITAL_COMMUNITY): Payer: Self-pay

## 2022-03-02 NOTE — Progress Notes (Incomplete Revision)
I left a message for the patient to return my call at [phone] to do onboarding for Humira. Will call back tomorrow.

## 2022-03-02 NOTE — Progress Notes (Addendum)
I left a message for the patient to return my call at [phone] to do onboarding for Humira. Will call back tomorrow.   Called pt on 12.4.23. Left HIPAA compliant message to call back [phone] to set up onboarding.

## 2022-03-03 ENCOUNTER — Other Ambulatory Visit (HOSPITAL_COMMUNITY): Payer: Self-pay

## 2022-03-04 ENCOUNTER — Other Ambulatory Visit (HOSPITAL_COMMUNITY): Payer: Self-pay

## 2022-03-04 NOTE — Telephone Encounter (Signed)
Called and left another HIPAA compliant message on patients voicemail for [employer]

## 2022-03-07 ENCOUNTER — Other Ambulatory Visit (HOSPITAL_COMMUNITY): Payer: Self-pay

## 2022-03-14 ENCOUNTER — Other Ambulatory Visit (HOSPITAL_COMMUNITY): Payer: Self-pay

## 2022-03-15 ENCOUNTER — Ambulatory Visit: Payer: Medicaid Other | Admitting: Internal Medicine

## 2022-03-29 ENCOUNTER — Other Ambulatory Visit: Payer: Self-pay

## 2022-03-31 ENCOUNTER — Other Ambulatory Visit (HOSPITAL_COMMUNITY): Payer: Self-pay

## 2022-03-31 ENCOUNTER — Telehealth: Payer: Self-pay | Admitting: Pharmacist

## 2022-03-31 NOTE — Telephone Encounter (Signed)
Patient called with her husband and granddaughter stating Humira pen is not working. It has been 14 days exact since they've pulled from fridge. I advised that today is the absolute last day they can try to use the Humira pen. They tried to injection in the back of arm while on phone with me. Stating pen is still not injecting.  I advised that I cannot provide much guidance over the phone but they are welcome to come to clinic for advisement. Husband states his car has broken down. They will pull another Humira pen out of fridge and try that pen. IF any issues, they will take to PCP office tomorrow.   Advised to call me back asap if further training on Humira pen needed.  Chesley Mires, PharmD, MPH, BCPS, CPP Clinical Pharmacist (Rheumatology and Pulmonology)

## 2022-04-01 ENCOUNTER — Other Ambulatory Visit (HOSPITAL_COMMUNITY): Payer: Self-pay

## 2022-04-05 ENCOUNTER — Other Ambulatory Visit: Payer: Self-pay

## 2022-04-05 ENCOUNTER — Other Ambulatory Visit (HOSPITAL_COMMUNITY): Payer: Self-pay

## 2022-04-13 ENCOUNTER — Ambulatory Visit: Payer: Medicaid Other | Attending: Internal Medicine | Admitting: Internal Medicine

## 2022-04-13 NOTE — Progress Notes (Deleted)
Office Visit Note  Patient: [name]             Date of Birth: [birth year]           MRN: [id number]             PCP: Bernerd Limbo, MD Referring: Bernerd Limbo, MD Visit Date: 04/13/2022   Subjective:  No chief complaint on file.   History of Present Illness: [name] is a 57 y.o. female here for follow up ***   Previous HPI 12/14/21 [name] is a 57 y.o. female here for follow up for seropositive RA on MTX 15 mg PO weekly and folic acid 1 mg daily. She continues to have swelling in her hands but not much pain unless tightly gripping or under pressure. Her right knee pain limits weightbearing severely and she has not been able to travel even 5-10 ft distances to the bathroom consistently. She is not sure whether this knee swells usually. 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   Previous HPI [name] is a 57 y.o. female here for follow up for seropositive RA on MTX 20 mg PO weekly and folic acid 1 mg daily. She took a course of oral prednisone after last visit in November but joint symptoms have been reasonably controlled on just methotrexate since then. We had discussed possible addition of Humira. She remains limited in mobility mostly due to left leg weakness and stiffness, using a cane at home.   Her husband is present at visit today assisting with transportation.    Previous HPI 02/10/21 [name] is a 57 y.o. female here for telehealth visit by video for follow up of her seropositive rheumatoid arthritis on methotrexate 20 mg PO weekly. She is experiencing increased joint pain and swelling for the past 2 to 3 weeks unable to tightly close her right hand and cannot bear weight enough to assist in transferring out of bed and wheelchair. She reports feeling better each week after taking methotrexate but quickly worsens again within a few days and this has been worsening. Otherwise no new side effect or health changes. We  reviewed her previous clinic visit still had some ongoing swelling and very high sedimentation rate tests but the pain and mobility is a lot worse.   Previous HPI 06/19/20 [name] is a 57 y.o. female with history of hypothyroidism, CVA with residual left hemiparesis, HTN, HLD here for evaluation of arthritis of multiple joints and positive rheumatoid factor. She has symptoms for some amount of time at least 6 months, swelling, and stiffness of joints on the right half of her body including elbow, wrist, fingers, and knee. She was evaluated in PCP clinic with highly positive inflammatory markers and RA antibodies with swollen joints noted on exam in 11/2019. She also had positive ANA and RNP antibodies. Xray of the right hand was checked and reportedly not show other cause of joint pain and no erosion. She has used some topical diclofenac without much improvement and tried tramadol also without much improvement. She has become unable to walk much due to severe pain bearing weight on her right knee and the left leg is too weak. Today she says symptoms are doing better compared to 2 months ago severity. She denies any complaints of hair loss, rashes, lymphadenopathy, oral ulcers, raynaud's.    Labs reviewed 11/2019 ESR 114 CRP 36 RF 421.6 CCP >250 ANA pos RNP >8.0 DsDNA, Sm, SSA, SSB neg  Vit D 12.7     No Rheumatology ROS completed.   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   Seropositive rheumatoid arthritis ([place]) 06/19/2020   High risk medication use 06/19/2020   Vitamin D deficiency 06/19/2020   Arthritis  involving multiple sites 02/18/2018   Hyperlipidemia LDL goal <70 02/16/2018   Class 3 severe obesity due to excess calories with serious comorbidity and body mass index (BMI) of 45.0 to 49.9 in adult ([place]) 02/16/2018   Hemiparesis and alteration of sensations as late effects of stroke ([place]) 02/16/2018   Anogenital (venereal) warts 08/04/2017   Screening for colorectal cancer 08/04/2017   Encounter for gynecological examination with Papanicolaou smear of cervix 08/04/2017   Vertigo 08/04/2017   Vertigo as late effect of stroke 08/04/2017   Smoker 06/01/2017   Acquired hypothyroidism 05/12/2017   Acute CVA (cerebrovascular accident) ([place]) 06/25/2013   Tremor of left hand 06/25/2013   Hemiparesis, left ([place]) 06/25/2013   History of CVA with residual deficit 06/25/2013   Difficulty in walking(719.7) 08/31/2011   Unstable balance 08/31/2011   Weakness of left side of body 08/31/2011   Difficulty walking 08/31/2011   Cerebral infarction ([place]) 04/07/2011   Left hand weakness 04/07/2011   Essential hypertension 04/07/2011    Past Medical History:  Diagnosis Date   High cholesterol    Hypertension    Hypothyroidism    Stroke ([place])    weakness on left side.   Vaginal Pap smear, abnormal     Family History  Problem Relation Age of Onset   Hypertension Mother    Liver disease Mother    Heart attack Father    HIV Sister    Arrhythmia Sister    Mood Disorder Sister    Arrhythmia Brother    CAD Brother    Past Surgical History:  Procedure Laterality Date   DILATION AND CURETTAGE OF UTERUS     LASER ABLATION CONDOLAMATA Bilateral 09/27/2017   Procedure: LASER ABLATION CONDYLOMA AND REMOVAL OF PERIANAL WART;  Surgeon: Florian Buff, MD;  Location: [facility];  Service: Gynecology;  Laterality: Bilateral;   MASS EXCISION Left 09/27/2017   Procedure: EXCISION LESION LEFT THIGH;  Surgeon: Florian Buff, MD;  Location: [facility];  Service: Gynecology;  Laterality: Left;   Social History    Social History Narrative   Grew up in [place], [place].   Engaged to be married.   5 children.    Eats mainly meat/junk food.   Immunization History  Administered Date(s) Administered   COVID-19, mRNA, vaccine(Comirnaty)12 years and older 01/25/2022   PFIZER Comirnaty(Gray Top)Covid-19 Tri-Sucrose Vaccine 08/26/2020   PFIZER(Purple Top)SARS-COV-2 Vaccination 12/12/2019, 01/02/2020     Objective: Vital Signs: LMP 07/29/2012    Physical Exam   Musculoskeletal Exam: ***  CDAI Exam: CDAI Score: -- Patient Global: --; Provider Global: -- Swollen: --; Tender: -- Joint Exam 04/13/2022   No joint exam has been documented for this visit   There is currently no information documented on the homunculus. Go to the Rheumatology activity and complete the homunculus joint exam.  Investigation: No additional findings.  Imaging: No results found.  Recent  Labs: Lab Results  Component Value Date   WBC 8.7 01/18/2022   HGB 9.5 (L) 01/18/2022   PLT 421 (H) 01/18/2022   NA 137 01/18/2022   K 3.4 (L) 01/18/2022   CL 103 01/18/2022   CO2 27 01/18/2022   GLUCOSE 93 01/18/2022   BUN 17 01/18/2022   CREATININE 0.50 01/18/2022   BILITOT 1.1 01/15/2022   ALKPHOS 52 01/15/2022   AST 48 (H) 01/15/2022   ALT 20 01/15/2022   PROT 7.7 01/15/2022   ALBUMIN 2.2 (L) 01/15/2022   CALCIUM 8.5 (L) 01/18/2022   GFRAA 115 07/29/2020   QFTBGOLDPLUS NEGATIVE 12/14/2021    Speciality Comments: No specialty comments available.  Procedures:  No procedures performed Allergies: Lisinopril   Assessment / Plan:     Visit Diagnoses: No diagnosis found.  ***  Orders: No orders of the defined types were placed in this encounter.  No orders of the defined types were placed in this encounter.    Follow-Up Instructions: No follow-ups on file.   Collier Salina, MD  Note - This record has been created using [employer].  Chart creation errors have been sought, but may not always   have been located. Such creation errors do not reflect on  the standard of medical care.

## 2022-04-19 ENCOUNTER — Other Ambulatory Visit (HOSPITAL_COMMUNITY): Payer: Self-pay

## 2022-04-20 ENCOUNTER — Other Ambulatory Visit: Payer: Self-pay | Admitting: Internal Medicine

## 2022-04-20 DIAGNOSIS — M059 Rheumatoid arthritis with rheumatoid factor, unspecified: Secondary | ICD-10-CM

## 2022-04-24 NOTE — Progress Notes (Deleted)
Office Visit Note  Patient: [name]             Date of Birth: 12-19-1965           MRN: [id number]             PCP: Bernerd Limbo, MD Referring: Bernerd Limbo, MD Visit Date: 04/25/2022   Subjective:  No chief complaint on file.   History of Present Illness: Rashika Fauci is a 57 y.o. female here for follow up ***   Previous HPI 12/14/21 Goddess Derr is a 57 y.o. female here for follow up for seropositive RA on MTX 15 mg PO weekly and folic acid 1 mg daily. She continues to have swelling in her hands but not much pain unless tightly gripping or under pressure. Her right knee pain limits weightbearing severely and she has not been able to travel even 5-10 ft distances to the bathroom consistently. She is not sure whether this knee swells usually. 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   Previous HPI Elianne Hanek is a 57 y.o. female here for follow up for seropositive RA on MTX 20 mg PO weekly and folic acid 1 mg daily. She took a course of oral prednisone after last visit in November but joint symptoms have been reasonably controlled on just methotrexate since then. We had discussed possible addition of Humira. She remains limited in mobility mostly due to left leg weakness and stiffness, using a cane at home.   Her husband is present at visit today assisting with transportation.    Previous HPI 02/10/21 Zorka Savarese is a 57 y.o. female here for telehealth visit by video for follow up of her seropositive rheumatoid arthritis on methotrexate 20 mg PO weekly. She is experiencing increased joint pain and swelling for the past 2 to 3 weeks unable to tightly close her right hand and cannot bear weight enough to assist in transferring out of bed and wheelchair. She reports feeling better each week after taking methotrexate but quickly worsens again within a few days and this has been worsening. Otherwise no new side effect or health changes. We  reviewed her previous clinic visit still had some ongoing swelling and very high sedimentation rate tests but the pain and mobility is a lot worse.   Previous HPI 06/19/20 Bithiah Martinz is a 57 y.o. female with history of hypothyroidism, CVA with residual left hemiparesis, HTN, HLD here for evaluation of arthritis of multiple joints and positive rheumatoid factor. She has symptoms for some amount of time at least 6 months, swelling, and stiffness of joints on the right half of her body including elbow, wrist, fingers, and knee. She was evaluated in PCP clinic with highly positive inflammatory markers and RA antibodies with swollen joints noted on exam in 11/2019. She also had positive ANA and RNP antibodies. Xray of the right hand was checked and reportedly not show other cause of joint pain and no erosion. She has used some topical diclofenac without much improvement and tried tramadol also without much improvement. She has become unable to walk much due to severe pain bearing weight on her right knee and the left leg is too weak. Today she says symptoms are doing better compared to 2 months ago severity. She denies any complaints of hair loss, rashes, lymphadenopathy, oral ulcers, raynaud's.    Labs reviewed 11/2019 ESR 114 CRP 36 RF 421.6 CCP >250 ANA pos RNP >8.0 DsDNA, Sm, SSA, SSB neg  Vit D 12.7   No Rheumatology ROS completed.   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   Seropositive rheumatoid arthritis ([place]) 06/19/2020   High risk medication use 06/19/2020   Vitamin D deficiency 06/19/2020   Arthritis  involving multiple sites 02/18/2018   Hyperlipidemia LDL goal <70 02/16/2018   Class 3 severe obesity due to excess calories with serious comorbidity and body mass index (BMI) of 45.0 to 49.9 in adult ([place]) 02/16/2018   Hemiparesis and alteration of sensations as late effects of stroke ([place]) 02/16/2018   Anogenital (venereal) warts 08/04/2017   Screening for colorectal cancer 08/04/2017   Encounter for gynecological examination with Papanicolaou smear of cervix 08/04/2017   Vertigo 08/04/2017   Vertigo as late effect of stroke 08/04/2017   Smoker 06/01/2017   Acquired hypothyroidism 05/12/2017   Acute CVA (cerebrovascular accident) ([place]) 06/25/2013   Tremor of left hand 06/25/2013   Hemiparesis, left ([place]) 06/25/2013   History of CVA with residual deficit 06/25/2013   Difficulty in walking(719.7) 08/31/2011   Unstable balance 08/31/2011   Weakness of left side of body 08/31/2011   Difficulty walking 08/31/2011   Cerebral infarction ([place]) 04/07/2011   Left hand weakness 04/07/2011   Essential hypertension 04/07/2011    Past Medical History:  Diagnosis Date   High cholesterol    Hypertension    Hypothyroidism    Stroke ([place])    weakness on left side.   Vaginal Pap smear, abnormal     Family History  Problem Relation Age of Onset   Hypertension Mother    Liver disease Mother    Heart attack Father    HIV Sister    Arrhythmia Sister    Mood Disorder Sister    Arrhythmia Brother    CAD Brother    Past Surgical History:  Procedure Laterality Date   DILATION AND CURETTAGE OF UTERUS     LASER ABLATION CONDOLAMATA Bilateral 09/27/2017   Procedure: LASER ABLATION CONDYLOMA AND REMOVAL OF PERIANAL WART;  Surgeon: Florian Buff, MD;  Location: [facility];  Service: Gynecology;  Laterality: Bilateral;   MASS EXCISION Left 09/27/2017   Procedure: EXCISION LESION LEFT THIGH;  Surgeon: Florian Buff, MD;  Location: [facility];  Service: Gynecology;  Laterality: Left;   Social History    Social History Narrative   Grew up in [place], [place].   Engaged to be married.   5 children.    Eats mainly meat/junk food.   Immunization History  Administered Date(s) Administered   COVID-19, mRNA, vaccine(Comirnaty)12 years and older 01/25/2022   PFIZER Comirnaty(Gray Top)Covid-19 Tri-Sucrose Vaccine 08/26/2020   PFIZER(Purple Top)SARS-COV-2 Vaccination 12/12/2019, 01/02/2020     Objective: Vital Signs: LMP 07/29/2012    Physical Exam   Musculoskeletal Exam: ***  CDAI Exam: CDAI Score: -- Patient Global: --; Provider Global: -- Swollen: --; Tender: -- Joint Exam 04/25/2022   No joint exam has been documented for this visit   There is currently no information documented on the homunculus. Go to the Rheumatology activity and complete the homunculus joint exam.  Investigation: No additional findings.  Imaging: No results found.  Recent Labs: Lab  Results  Component Value Date   WBC 8.7 01/18/2022   HGB 9.5 (L) 01/18/2022   PLT 421 (H) 01/18/2022   NA 137 01/18/2022   K 3.4 (L) 01/18/2022   CL 103 01/18/2022   CO2 27 01/18/2022   GLUCOSE 93 01/18/2022   BUN 17 01/18/2022   CREATININE 0.50 01/18/2022   BILITOT 1.1 01/15/2022   ALKPHOS 52 01/15/2022   AST 48 (H) 01/15/2022   ALT 20 01/15/2022   PROT 7.7 01/15/2022   ALBUMIN 2.2 (L) 01/15/2022   CALCIUM 8.5 (L) 01/18/2022   GFRAA 115 07/29/2020   QFTBGOLDPLUS NEGATIVE 12/14/2021    Speciality Comments: No specialty comments available.  Procedures:  No procedures performed Allergies: Lisinopril   Assessment / Plan:     Visit Diagnoses: No diagnosis found.  ***  Orders: No orders of the defined types were placed in this encounter.  No orders of the defined types were placed in this encounter.    Follow-Up Instructions: No follow-ups on file.   Collier Salina, MD  Note - This record has been created using [employer].  Chart creation errors have been sought, but may not always   have been located. Such creation errors do not reflect on  the standard of medical care.

## 2022-04-25 ENCOUNTER — Ambulatory Visit: Payer: Medicaid Other | Admitting: Internal Medicine

## 2022-04-26 ENCOUNTER — Other Ambulatory Visit (HOSPITAL_COMMUNITY): Payer: Self-pay

## 2022-04-26 ENCOUNTER — Other Ambulatory Visit: Payer: Self-pay

## 2022-04-27 ENCOUNTER — Other Ambulatory Visit: Payer: Self-pay

## 2022-05-09 DIAGNOSIS — Z1231 Encounter for screening mammogram for malignant neoplasm of breast: Secondary | ICD-10-CM | POA: Diagnosis not present

## 2022-05-09 DIAGNOSIS — D649 Anemia, unspecified: Secondary | ICD-10-CM | POA: Diagnosis not present

## 2022-05-09 DIAGNOSIS — Z2820 Immunization not carried out because of patient decision for unspecified reason: Secondary | ICD-10-CM | POA: Diagnosis not present

## 2022-05-09 DIAGNOSIS — E039 Hypothyroidism, unspecified: Secondary | ICD-10-CM | POA: Diagnosis not present

## 2022-05-09 DIAGNOSIS — G4700 Insomnia, unspecified: Secondary | ICD-10-CM | POA: Diagnosis not present

## 2022-05-09 DIAGNOSIS — I10 Essential (primary) hypertension: Secondary | ICD-10-CM | POA: Diagnosis not present

## 2022-05-17 ENCOUNTER — Other Ambulatory Visit: Payer: Self-pay | Admitting: Internal Medicine

## 2022-05-17 ENCOUNTER — Other Ambulatory Visit (HOSPITAL_COMMUNITY): Payer: Self-pay

## 2022-05-17 DIAGNOSIS — Z79899 Other long term (current) drug therapy: Secondary | ICD-10-CM

## 2022-05-17 DIAGNOSIS — M059 Rheumatoid arthritis with rheumatoid factor, unspecified: Secondary | ICD-10-CM

## 2022-05-18 ENCOUNTER — Other Ambulatory Visit (HOSPITAL_COMMUNITY): Payer: Self-pay

## 2022-05-20 ENCOUNTER — Other Ambulatory Visit (HOSPITAL_COMMUNITY): Payer: Self-pay

## 2022-05-23 NOTE — Progress Notes (Signed)
Office Visit Note  Patient: [name]             Date of Birth: 1965/09/17           MRN: [id number]             PCP: Bernerd Limbo, MD Referring: Bernerd Limbo, MD Visit Date: 05/24/2022   Subjective:  Follow-up (Patient states she needs a shot and a prescription called in.)   History of Present Illness: [name] is a 57 y.o. female here for follow up for seropositive RA on Humira 40 mg subcu q. 14 days and methotrexate 50 mg p.o. weekly folic acid 1 mg daily.  Since her last visit she had 1 hospitalization for severe illness with sepsis requiring IV antibiotic treatment but she recovered entirely after this.  She has been doing reasonably well without severe RA flareups off of any maintenance steroids for at least 3 months.  Started on Humira in November taking her injections subcutaneous on the abdomen with no problems.  No serious infections since starting this medication.  She ran out of the methotrexate within the past few weeks due to missing some scheduled clinic follow-up.  She remains mostly immobile without much progress walking or doing any regular exercises towards this goal.  Her husband is present at visit today assisting with transportation.   Previous HPI 12/14/21 [name] is a 57 y.o. female here for follow up for seropositive RA on MTX 15 mg PO weekly and folic acid 1 mg daily. She continues to have swelling in her hands but not much pain unless tightly gripping or under pressure. Her right knee pain limits weightbearing severely and she has not been able to travel even 5-10 ft distances to the bathroom consistently. She is not sure whether this knee swells usually. 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   Previous HPI [name] is a 57 y.o. female here for follow up for seropositive RA on MTX 20 mg PO weekly and folic acid 1 mg daily. She took a course of oral prednisone after last visit in November but joint  symptoms have been reasonably controlled on just methotrexate since then. We had discussed possible addition of Humira. She remains limited in mobility mostly due to left leg weakness and stiffness, using a cane at home.   Previous HPI 02/10/21 [name] is a 57 y.o. female here for telehealth visit by video for follow up of her seropositive rheumatoid arthritis on methotrexate 20 mg PO weekly. She is experiencing increased joint pain and swelling for the past 2 to 3 weeks unable to tightly close her right hand and cannot bear weight enough to assist in transferring out of bed and wheelchair. She reports feeling better each week after taking methotrexate but quickly worsens again within a few days and this has been worsening. Otherwise no new side effect or health changes. We reviewed her previous clinic visit still had some ongoing swelling and very high sedimentation rate tests but the pain and mobility is a lot worse.   Previous HPI 06/19/20 [name] is a 57 y.o. female with history of hypothyroidism, CVA with residual left hemiparesis, HTN, HLD here for evaluation of arthritis of multiple joints and positive rheumatoid factor. She has symptoms for some amount of time at least 6 months, swelling, and stiffness of joints on the right half of her body including elbow, wrist, fingers, and knee. She was evaluated in PCP clinic  with highly positive inflammatory markers and RA antibodies with swollen joints noted on exam in 11/2019. She also had positive ANA and RNP antibodies. Xray of the right hand was checked and reportedly not show other cause of joint pain and no erosion. She has used some topical diclofenac without much improvement and tried tramadol also without much improvement. She has become unable to walk much due to severe pain bearing weight on her right knee and the left leg is too weak. Today she says symptoms are doing better compared to 2 months ago severity. She denies any complaints of  hair loss, rashes, lymphadenopathy, oral ulcers, raynaud's.    Labs reviewed 11/2019 ESR 114 CRP 36 RF 421.6 CCP >250 ANA pos RNP >8.0 DsDNA, Sm, SSA, SSB neg Vit D 12.7   Review of Systems  Constitutional:  Negative for fatigue.  HENT:  Negative for mouth sores and mouth dryness.   Eyes:  Negative for dryness.  Respiratory:  Negative for shortness of breath.   Cardiovascular:  Negative for chest pain and palpitations.  Gastrointestinal:  Negative for blood in stool, constipation and diarrhea.  Endocrine: Negative for increased urination.  Genitourinary:  Negative for involuntary urination.  Musculoskeletal:  Positive for joint swelling. Negative for joint pain, gait problem, joint pain, myalgias, muscle weakness, morning stiffness, muscle tenderness and myalgias.  Skin:  Negative for color change, rash, hair loss and sensitivity to sunlight.  Allergic/Immunologic: Negative for susceptible to infections.  Neurological:  Negative for dizziness and headaches.  Hematological:  Negative for swollen glands.  Psychiatric/Behavioral:  Positive for sleep disturbance. Negative for depressed mood. The patient is not nervous/anxious.     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   Seropositive rheumatoid arthritis ([place]) 06/19/2020   High risk medication use 06/19/2020   Vitamin D deficiency 06/19/2020   Arthritis involving multiple sites 02/18/2018   Hyperlipidemia LDL goal <70 02/16/2018   Class 3 severe obesity due to excess calories with serious comorbidity and body mass index (BMI) of 45.0 to 49.9 in adult  ([place]) 02/16/2018   Hemiparesis and alteration of sensations as late effects of stroke ([place]) 02/16/2018   Anogenital (venereal) warts 08/04/2017   Screening for colorectal cancer 08/04/2017   Encounter for gynecological examination with Papanicolaou smear of cervix 08/04/2017   Vertigo 08/04/2017   Vertigo as late effect of stroke 08/04/2017   Smoker 06/01/2017   Acquired hypothyroidism 05/12/2017   Acute CVA (cerebrovascular accident) ([place]) 06/25/2013   Tremor of left hand 06/25/2013   Hemiparesis, left ([place]) 06/25/2013   History of CVA with residual deficit 06/25/2013   Difficulty in walking(719.7) 08/31/2011   Unstable balance 08/31/2011   Weakness of left side of body 08/31/2011   Difficulty walking 08/31/2011   Cerebral infarction ([place]) 04/07/2011   Left hand weakness 04/07/2011   Essential hypertension 04/07/2011    Past Medical History:  Diagnosis Date   High cholesterol    Hypertension    Hypothyroidism    Stroke ([place])    weakness on left side.   Vaginal Pap smear, abnormal     Family History  Problem Relation Age of Onset  Hypertension Mother    Liver disease Mother    Heart attack Father    HIV Sister    Arrhythmia Sister    Mood Disorder Sister    Arrhythmia Brother    CAD Brother    Past Surgical History:  Procedure Laterality Date   DILATION AND CURETTAGE OF UTERUS     LASER ABLATION CONDOLAMATA Bilateral 09/27/2017   Procedure: LASER ABLATION CONDYLOMA AND REMOVAL OF PERIANAL WART;  Surgeon: Florian Buff, MD;  Location: [facility];  Service: Gynecology;  Laterality: Bilateral;   MASS EXCISION Left 09/27/2017   Procedure: EXCISION LESION LEFT THIGH;  Surgeon: Florian Buff, MD;  Location: [facility];  Service: Gynecology;  Laterality: Left;   Social History   Social History Narrative   Grew up in [place], [place].   Engaged to be married.   5 children.    Eats mainly meat/junk food.   Immunization History  Administered Date(s) Administered   COVID-19,  mRNA, vaccine(Comirnaty)12 years and older 01/25/2022   PFIZER Comirnaty(Gray Top)Covid-19 Tri-Sucrose Vaccine 08/26/2020   PFIZER(Purple Top)SARS-COV-2 Vaccination 12/12/2019, 01/02/2020     Objective: Vital Signs: BP 110/72 (BP Location: Right Arm, Patient Position: Sitting, Cuff Size: Large)   Pulse (!) 105   Resp 20   Ht 5\' 6"  (1.676 m) Comment: patient in wheelchair  Wt 240 lb (108.9 kg) Comment: Patient in wheelchair  LMP 07/29/2012   BMI 38.74 kg/m    Physical Exam Constitutional:      Appearance: She is obese.     Comments: In wheelchair  Cardiovascular:     Rate and Rhythm: Regular rhythm. Tachycardia present.  Pulmonary:     Effort: Pulmonary effort is normal.     Breath sounds: Normal breath sounds.  Lymphadenopathy:     Cervical: No cervical adenopathy.  Skin:    General: Skin is warm and dry.     Findings: No rash.  Neurological:     Mental Status: She is alert.  Psychiatric:        Mood and Affect: Mood normal.      Musculoskeletal Exam:  Right shoulder elbow and wrist movement normal, mild chronic first second MCP joint thickening no palpable synovitis or tenderness and range of motion is normal Left arm with severely limited active range of motion throughout fingers with very limited passive range of motion trace edema in the hand with no tenderness or erythema Right knee with full range of motion, left knee restricted flexion and extension there is proximal muscle atrophy Negative MTP squeeze tenderness bilaterally   CDAI Exam: CDAI Score: 4  Patient Global: 30 mm; Provider Global: 10 mm Swollen: 0 ; Tender: 0  Joint Exam 05/24/2022   All documented joints were normal     Investigation: No additional findings.  Imaging: No results found.  Recent Labs: Lab Results  Component Value Date   WBC 8.7 01/18/2022   HGB 9.5 (L) 01/18/2022   PLT 421 (H) 01/18/2022   NA 137 01/18/2022   K 3.4 (L) 01/18/2022   CL 103 01/18/2022   CO2 27  01/18/2022   GLUCOSE 93 01/18/2022   BUN 17 01/18/2022   CREATININE 0.50 01/18/2022   BILITOT 1.1 01/15/2022   ALKPHOS 52 01/15/2022   AST 48 (H) 01/15/2022   ALT 20 01/15/2022   PROT 7.7 01/15/2022   ALBUMIN 2.2 (L) 01/15/2022   CALCIUM 8.5 (L) 01/18/2022   GFRAA 115 07/29/2020   QFTBGOLDPLUS NEGATIVE 12/14/2021    Speciality Comments: No specialty  comments available.  Procedures:  No procedures performed Allergies: Lisinopril   Assessment / Plan:     Visit Diagnoses: Seropositive rheumatoid arthritis ([place]) - Plan: Sedimentation rate, C-reactive protein, methotrexate (RHEUMATREX) 2.5 MG tablet, Adalimumab (HUMIRA, 2 PEN,) 40 HY/8.6VH PNKT, folic acid (FOLVITE) 1 MG tablet  Inflammation appears better controlled now on combination therapy.  No peripheral joint synovitis on exam today.  Checking sedimentation rate for disease activity monitoring.  Plan to continue Humira 40 mg subcu q. 14 days methotrexate 15 mg p.o. weekly and folic acid 1 mg daily.  High risk medication use - Plan: CBC with Differential/Platelet, COMPLETE METABOLIC PANEL WITH GFR  Checking CBC and CMP for medication monitoring on continued treatment with Humira and methotrexate.  Over the interval had a hospitalization due to pneumonia and hypoxic respiratory failure this was on methotrexate but prior to starting Humira and no new infections after medication start.  Hemiparesis, left ([place]) - Plan: Ambulatory referral to Physical Therapy  Mobility looks more limited by existing weakness related to arthritis but also deconditioning and hemiparesis.  Will refer to physical therapy imaging to improve mobility less wheelchair dependence.  Orders: Orders Placed This Encounter  Procedures   Sedimentation rate   C-reactive protein   CBC with Differential/Platelet   COMPLETE METABOLIC PANEL WITH GFR   Ambulatory referral to Physical Therapy   Meds ordered this encounter  Medications   methotrexate (RHEUMATREX)  2.5 MG tablet    Sig: Take 6 tablets (15 mg total) by mouth once a week. Caution:Chemotherapy. Protect from light.    Dispense:  78 tablet    Refill:  0   Adalimumab (HUMIRA, 2 PEN,) 40 MG/0.4ML PNKT    Sig: Inject 40 mg into the skin every 14 (fourteen) days.    Dispense:  2 each    Refill:  2    Received first dose in clinic with sample on 03/01/2022.   folic acid (FOLVITE) 1 MG tablet    Sig: Take 1 tablet (1 mg total) by mouth daily.    Dispense:  90 tablet    Refill:  3     Follow-Up Instructions: Return in about 3 months (around 08/22/2022) for RA on ADA/MTX f/u 44mos.   Collier Salina, MD  Note - This record has been created using [employer].  Chart creation errors have been sought, but may not always  have been located. Such creation errors do not reflect on  the standard of medical care.

## 2022-05-24 ENCOUNTER — Encounter: Payer: Self-pay | Admitting: Internal Medicine

## 2022-05-24 ENCOUNTER — Ambulatory Visit: Payer: Medicaid Other | Attending: Internal Medicine | Admitting: Internal Medicine

## 2022-05-24 VITALS — BP 110/72 | HR 105 | Resp 20 | Ht 66.0 in | Wt 240.0 lb

## 2022-05-24 DIAGNOSIS — Z79899 Other long term (current) drug therapy: Secondary | ICD-10-CM | POA: Diagnosis not present

## 2022-05-24 DIAGNOSIS — G8194 Hemiplegia, unspecified affecting left nondominant side: Secondary | ICD-10-CM

## 2022-05-24 DIAGNOSIS — M059 Rheumatoid arthritis with rheumatoid factor, unspecified: Secondary | ICD-10-CM | POA: Diagnosis not present

## 2022-05-24 MED ORDER — FOLIC ACID 1 MG PO TABS: 1.0000 mg | ORAL_TABLET | Freq: Every day | ORAL | 3 refills | Status: DC

## 2022-05-24 MED ORDER — METHOTREXATE SODIUM 2.5 MG PO TABS: 15.0000 mg | ORAL_TABLET | ORAL | 0 refills | Status: DC

## 2022-05-24 MED ORDER — HUMIRA (2 PEN) 40 MG/0.4ML ~~LOC~~ AJKT: 40.0000 mg | AUTO-INJECTOR | SUBCUTANEOUS | 2 refills | Status: DC

## 2022-05-25 LAB — COMPLETE METABOLIC PANEL WITH GFR
AG Ratio: 0.6 (calc) — ABNORMAL LOW (ref 1.0–2.5)
ALT: 13 U/L (ref 6–29)
AST: 36 U/L — ABNORMAL HIGH (ref 10–35)
Albumin: 3.2 g/dL — ABNORMAL LOW (ref 3.6–5.1)
Alkaline phosphatase (APISO): 75 U/L (ref 37–153)
BUN: 10 mg/dL (ref 7–25)
CO2: 27 mmol/L (ref 20–32)
Calcium: 8.7 mg/dL (ref 8.6–10.4)
Chloride: 106 mmol/L (ref 98–110)
Creat: 0.63 mg/dL (ref 0.50–1.03)
Globulin: 5.1 g/dL (calc) — ABNORMAL HIGH (ref 1.9–3.7)
Glucose, Bld: 75 mg/dL (ref 65–99)
Potassium: 4 mmol/L (ref 3.5–5.3)
Sodium: 138 mmol/L (ref 135–146)
Total Bilirubin: 0.2 mg/dL (ref 0.2–1.2)
Total Protein: 8.3 g/dL — ABNORMAL HIGH (ref 6.1–8.1)
eGFR: 105 mL/min/{1.73_m2} (ref >=60)

## 2022-05-25 LAB — CBC WITH DIFFERENTIAL/PLATELET
Absolute Monocytes: 248 cells/uL (ref 200–950)
Basophils Absolute: 28 cells/uL (ref 0–200)
Basophils Relative: 0.5 %
Eosinophils Absolute: 99 cells/uL (ref 15–500)
Eosinophils Relative: 1.8 %
HCT: 31.3 % — ABNORMAL LOW (ref 35.0–45.0)
Hemoglobin: 10.3 g/dL — ABNORMAL LOW (ref 11.7–15.5)
Lymphs Abs: 1117 cells/uL (ref 850–3900)
MCH: 28.4 pg (ref 27.0–33.0)
MCHC: 32.9 g/dL (ref 32.0–36.0)
MCV: 86.2 fL (ref 80.0–100.0)
MPV: 8.9 fL (ref 7.5–12.5)
Monocytes Relative: 4.5 %
Neutro Abs: 4010 cells/uL (ref 1500–7800)
Neutrophils Relative %: 72.9 %
Platelets: 292 10*3/uL (ref 140–400)
RBC: 3.63 10*6/uL — ABNORMAL LOW (ref 3.80–5.10)
RDW: 16.3 % — ABNORMAL HIGH (ref 11.0–15.0)
Total Lymphocyte: 20.3 %
WBC: 5.5 10*3/uL (ref 3.8–10.8)

## 2022-05-25 LAB — C-REACTIVE PROTEIN: CRP: 15.4 mg/L — ABNORMAL HIGH (ref <8.0)

## 2022-05-25 LAB — SEDIMENTATION RATE: Sed Rate: 120 mm/h — ABNORMAL HIGH (ref 0–30)

## 2022-05-25 NOTE — Progress Notes (Signed)
Sed rate and CRP remain highly elevated indicating some ongoing inflammation. These can also be affected by ongoing smoking. Blood count looks good she has mild anemia but improved compared to last year. Liver enzyme test is just barely outside normal, I do not think she needs to change humira or methotrexate for this we can just monitor.

## 2022-06-02 ENCOUNTER — Other Ambulatory Visit (HOSPITAL_COMMUNITY): Payer: Self-pay

## 2022-06-15 ENCOUNTER — Telehealth: Payer: Self-pay | Admitting: Internal Medicine

## 2022-06-15 ENCOUNTER — Other Ambulatory Visit (HOSPITAL_COMMUNITY): Payer: Self-pay

## 2022-06-15 NOTE — Telephone Encounter (Signed)
Patient's daughter Janett Billow called requesting the referral be sent to [facility] at [address] [place].  She states it is closer to her home.

## 2022-06-16 ENCOUNTER — Other Ambulatory Visit (HOSPITAL_COMMUNITY): Payer: Self-pay

## 2022-06-23 ENCOUNTER — Telehealth: Payer: Self-pay

## 2022-06-23 NOTE — Telephone Encounter (Signed)
Patient contacted the office and stated she needs a refill on her Humira. After reviewing the chart, she has two refills left on her Humira. Attempted to contact patient and the patient's daughter Janett Billow answered. Bennie Pierini the patient should have two refills left on her Humira and she should contact her pharmacy. Janett Billow verbalized understanding.

## 2022-07-12 ENCOUNTER — Other Ambulatory Visit (HOSPITAL_COMMUNITY): Payer: Self-pay | Admitting: Physician Assistant

## 2022-07-12 DIAGNOSIS — Z1231 Encounter for screening mammogram for malignant neoplasm of breast: Secondary | ICD-10-CM

## 2022-08-22 ENCOUNTER — Ambulatory Visit: Payer: Medicaid Other | Attending: Internal Medicine | Admitting: Internal Medicine

## 2022-08-22 ENCOUNTER — Encounter: Payer: Self-pay | Admitting: Internal Medicine

## 2022-08-22 VITALS — BP 127/80 | HR 97 | Resp 16 | Ht 66.0 in | Wt 280.0 lb

## 2022-08-22 DIAGNOSIS — Z79899 Other long term (current) drug therapy: Secondary | ICD-10-CM

## 2022-08-22 DIAGNOSIS — M25561 Pain in right knee: Secondary | ICD-10-CM

## 2022-08-22 DIAGNOSIS — G8929 Other chronic pain: Secondary | ICD-10-CM | POA: Diagnosis not present

## 2022-08-22 DIAGNOSIS — M059 Rheumatoid arthritis with rheumatoid factor, unspecified: Secondary | ICD-10-CM

## 2022-08-22 MED ORDER — HUMIRA (2 PEN) 40 MG/0.4ML ~~LOC~~ AJKT: 40.0000 mg | AUTO-INJECTOR | SUBCUTANEOUS | 2 refills | Status: DC

## 2022-08-22 MED ORDER — METHOTREXATE SODIUM 2.5 MG PO TABS: 15.0000 mg | ORAL_TABLET | ORAL | 0 refills | Status: DC

## 2022-08-22 MED ORDER — FOLIC ACID 1 MG PO TABS: 1.0000 mg | ORAL_TABLET | Freq: Every day | ORAL | 3 refills | Status: DC

## 2022-08-22 NOTE — Progress Notes (Signed)
Office Visit Note  Patient: [name]             Date of Birth: May 03, 1965           MRN: [id number]             PCP: Tracey Harries, MD Referring: Tracey Harries, MD Visit Date: 08/22/2022   Subjective:   History of Present Illness: [name] is a 57 y.o. female here for follow up for seropositive RA on Humira 40 mg subcu q. 14 days and methotrexate 15 mg p.o. weekly and folic acid 1 mg daily.  Overall she feels symptoms are doing okay without major exacerbation.  She still has very limited mobility and did not start any formal work with physical therapy yet about her leg weakness and limited walking.  With weakness on left side gets some more joint pain and swelling in her right hand and right knee has not had any falls.  Previous HPI 05/24/22 [name] is a 57 y.o. female here for follow up for seropositive RA on Humira 40 mg subcu q. 14 days and methotrexate 15 mg p.o. weekly folic acid 1 mg daily.  Since her last visit she had 1 hospitalization for severe illness with sepsis requiring IV antibiotic treatment but she recovered entirely after this.  She has been doing reasonably well without severe RA flareups off of any maintenance steroids for at least 3 months.  Started on Humira in November taking her injections subcutaneous on the abdomen with no problems.  No serious infections since starting this medication.  She ran out of the methotrexate within the past few weeks due to missing some scheduled clinic follow-up.  She remains mostly immobile without much progress walking or doing any regular exercises towards this goal.   Her husband is present at visit today assisting with transportation.    Previous HPI 12/14/21 [name] is a 57 y.o. female here for follow up for seropositive RA on MTX 15 mg PO weekly and folic acid 1 mg daily. She continues to have swelling in her hands but not much pain unless tightly gripping or under pressure. Her right knee pain limits weightbearing  severely and she has not been able to travel even 5-10 ft distances to the bathroom consistently. She is not sure whether this knee swells usually. 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   Previous HPI [name] is a 57 y.o. female here for follow up for seropositive RA on MTX 20 mg PO weekly and folic acid 1 mg daily. She took a course of oral prednisone after last visit in November but joint symptoms have been reasonably controlled on just methotrexate since then. We had discussed possible addition of Humira. She remains limited in mobility mostly due to left leg weakness and stiffness, using a cane at home.   Previous HPI 02/10/21 [name] is a 57 y.o. female here for telehealth visit by video for follow up of her seropositive rheumatoid arthritis on methotrexate 20 mg PO weekly. She is experiencing increased joint pain and swelling for the past 2 to 3 weeks unable to tightly close her right hand and cannot bear weight enough to assist in transferring out of bed and wheelchair. She reports feeling better each week after taking methotrexate but quickly worsens again within a few days and this has been worsening. Otherwise no new side effect or health changes. We reviewed her previous clinic visit still had some  ongoing swelling and very high sedimentation rate tests but the pain and mobility is a lot worse.   Previous HPI 06/19/20 [name] is a 57 y.o. female with history of hypothyroidism, CVA with residual left hemiparesis, HTN, HLD here for evaluation of arthritis of multiple joints and positive rheumatoid factor. She has symptoms for some amount of time at least 6 months, swelling, and stiffness of joints on the right half of her body including elbow, wrist, fingers, and knee. She was evaluated in PCP clinic with highly positive inflammatory markers and RA antibodies with swollen joints noted on exam in 11/2019. She also had positive ANA and RNP  antibodies. Xray of the right hand was checked and reportedly not show other cause of joint pain and no erosion. She has used some topical diclofenac without much improvement and tried tramadol also without much improvement. She has become unable to walk much due to severe pain bearing weight on her right knee and the left leg is too weak. Today she says symptoms are doing better compared to 2 months ago severity. She denies any complaints of hair loss, rashes, lymphadenopathy, oral ulcers, raynaud's.    Labs reviewed 11/2019 ESR 114 CRP 36 RF 421.6 CCP >250 ANA pos RNP >8.0 DsDNA, Sm, SSA, SSB neg Vit D 12.7   Review of Systems  Constitutional:  Negative for fatigue.  HENT:  Negative for mouth sores and mouth dryness.   Eyes:  Negative for dryness.  Respiratory:  Negative for shortness of breath.   Cardiovascular:  Negative for chest pain and palpitations.  Gastrointestinal:  Negative for blood in stool, constipation and diarrhea.  Endocrine: Negative for increased urination.  Genitourinary:  Negative for involuntary urination.  Musculoskeletal:  Positive for gait problem and joint swelling. Negative for joint pain, joint pain, myalgias, muscle weakness, morning stiffness, muscle tenderness and myalgias.  Skin:  Negative for color change, rash, hair loss and sensitivity to sunlight.  Allergic/Immunologic: Negative for susceptible to infections.  Neurological:  Negative for dizziness and headaches.  Hematological:  Negative for swollen glands.  Psychiatric/Behavioral:  Negative for depressed mood and sleep disturbance. The patient is not nervous/anxious.     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/19/2020   High risk medication use 06/19/2020   Vitamin D deficiency 06/19/2020   Arthritis involving multiple sites 02/18/2018   Hyperlipidemia LDL goal <70 02/16/2018   Class 3 severe obesity due to excess calories with serious comorbidity and body mass index (BMI) of 45.0 to 49.9 in adult (HCC) 02/16/2018   Hemiparesis and alteration of sensations as late effects of stroke (HCC) 02/16/2018   Anogenital (venereal) warts 08/04/2017   Screening for colorectal cancer 08/04/2017   Encounter for gynecological examination with Papanicolaou smear of cervix 08/04/2017   Vertigo 08/04/2017   Vertigo as late effect of stroke 08/04/2017   Smoker 06/01/2017   Acquired hypothyroidism 05/12/2017   Acute CVA (cerebrovascular accident) (HCC) 06/25/2013   Tremor of left hand 06/25/2013   Hemiparesis, left (HCC) 06/25/2013   History of CVA with residual deficit 06/25/2013   Difficulty in walking(719.7) 08/31/2011  Unstable balance 08/31/2011   Weakness of left side of body 08/31/2011   Difficulty walking 08/31/2011   Cerebral infarction (HCC) 04/07/2011   Left hand weakness 04/07/2011   Essential hypertension 04/07/2011    Past Medical History:  Diagnosis Date   High cholesterol    Hypertension    Hypothyroidism    Stroke (HCC)    weakness on left side.   Vaginal Pap smear, abnormal     Family History  Problem Relation Age of Onset   Hypertension Mother    Liver disease Mother    Heart attack Father    HIV Sister    Arrhythmia Sister    Mood Disorder Sister    Arrhythmia Brother    CAD Brother    Past Surgical History:  Procedure Laterality Date   DILATION AND CURETTAGE OF UTERUS     LASER ABLATION CONDOLAMATA Bilateral 09/27/2017   Procedure: LASER ABLATION CONDYLOMA AND REMOVAL OF PERIANAL WART;  Surgeon: [name], MD;   Location: [facility];  Service: Gynecology;  Laterality: Bilateral;   MASS EXCISION Left 09/27/2017   Procedure: EXCISION LESION LEFT THIGH;  Surgeon: [name], MD;  Location: [facility];  Service: Gynecology;  Laterality: Left;   Social History   Social History Narrative   Grew up in [place], [place].   Engaged to be married.   5 children.    Eats mainly meat/junk food.   Immunization History  Administered Date(s) Administered   COVID-19, mRNA, vaccine(Comirnaty)12 years and older 01/25/2022   PFIZER Comirnaty(Gray Top)Covid-19 Tri-Sucrose Vaccine 08/26/2020   PFIZER(Purple Top)SARS-COV-2 Vaccination 12/12/2019, 01/02/2020     Objective: Vital Signs: BP 127/80 (BP Location: Right Arm, Patient Position: Sitting, Cuff Size: Normal)   Pulse 97   Resp 16   Ht 5\' 6"  (1.676 m) Comment: patient in wheelchair  Wt 280 lb (127 kg) Comment: patient in wheelchair  LMP 07/29/2012   BMI 45.19 kg/m    Physical Exam Constitutional:      Appearance: She is obese.     Comments: In wheelchair  Cardiovascular:     Rate and Rhythm: Normal rate and regular rhythm.  Pulmonary:     Effort: Pulmonary effort is normal.     Breath sounds: Normal breath sounds.  Lymphadenopathy:     Cervical: No cervical adenopathy.  Skin:    General: Skin is warm and dry.     Findings: No rash.  Neurological:     Mental Status: She is alert.  Psychiatric:        Mood and Affect: Mood normal.      Musculoskeletal Exam:  Right shoulder elbow and wrist normal movement no palpable swelling There is some swelling at the right second third MCP joint without significant tenderness, full range of motion intact Unable to raise the left arm actively and significantly restricted with passive range of motion, elbow contracture in flexed position, stiffness throughout the finger joints but able to be fully extended with assistance Right knee with full range of motion, crepitus but no tenderness to palpation, no palpable  effusion Left knee restricted flexion and extension range of motion   CDAI Exam: CDAI Score: 6  Patient Global: 10 mm; Provider Global: 30 mm Swollen: 2 ; Tender: 0  Joint Exam 08/22/2022      Right  Left  MCP 2  Swollen      MCP 3  Swollen            Investigation: No additional findings.  Imaging:  No results found.  Recent Labs: Lab Results  Component Value Date   WBC 4.3 08/22/2022   HGB 11.0 (L) 08/22/2022   PLT 276 08/22/2022   NA 138 08/22/2022   K 4.0 08/22/2022   CL 105 08/22/2022   CO2 28 08/22/2022   GLUCOSE 81 08/22/2022   BUN 14 08/22/2022   CREATININE 0.62 08/22/2022   BILITOT 0.3 08/22/2022   ALKPHOS 52 01/15/2022   AST 34 08/22/2022   ALT 14 08/22/2022   PROT 8.6 (H) 08/22/2022   ALBUMIN 2.2 (L) 01/15/2022   CALCIUM 9.1 08/22/2022   GFRAA 115 07/29/2020   QFTBGOLDPLUS NEGATIVE 12/14/2021    Speciality Comments: No specialty comments available.  Procedures:  No procedures performed Allergies: Lisinopril   Assessment / Plan:     Visit Diagnoses: Seropositive rheumatoid arthritis (HCC) - Plan: Sedimentation rate, methotrexate (RHEUMATREX) 2.5 MG tablet, folic acid (FOLVITE) 1 MG tablet, Adalimumab (HUMIRA, 2 PEN,) 40 MG/0.4ML PNKT  Still appears to have some continued disease activity with mild synovitis in the right hand and persistent inflammatory marker elevations.  Reports good medication adherence though she continues smoking which may be contributing to disease activity.  Despite inflammation she is not particularly bothered by inflammatory arthritis symptoms.  May need to get updated imaging especially right hand and wrist to monitor for radiographic progression.  Checking sed rate for disease monitoring.  Plan to continue Humira 40 mg subcu q. 14 days and methotrexate 15 mg p.o. weekly folic acid 1 mg daily.  Chronic pain of right knee  Right knee pain with weightbearing and crepitus consistent with primary osteoarthritis probably  worse due to her obesity, deconditioning, and having to favor this side because of left-sided weakness.  I stressed the importance of setting up and participating with physical therapy since decreased mobility will significantly worsen her risk from multiple comorbidities.  High risk medication use - Plan: CBC with Differential/Platelet, COMPLETE METABOLIC PANEL WITH GFR  Checking CBC and CMP for medication monitoring on continued treatment with Humira and methotrexate.  No interval serious infections or antibiotic treatments required.  Orders: Orders Placed This Encounter  Procedures   Sedimentation rate   CBC with Differential/Platelet   COMPLETE METABOLIC PANEL WITH GFR   Meds ordered this encounter  Medications   methotrexate (RHEUMATREX) 2.5 MG tablet    Sig: Take 6 tablets (15 mg total) by mouth once a week. Caution:Chemotherapy. Protect from light.    Dispense:  78 tablet    Refill:  0   folic acid (FOLVITE) 1 MG tablet    Sig: Take 1 tablet (1 mg total) by mouth daily.    Dispense:  90 tablet    Refill:  3   Adalimumab (HUMIRA, 2 PEN,) 40 MG/0.4ML PNKT    Sig: Inject 40 mg into the skin every 14 (fourteen) days.    Dispense:  2 each    Refill:  2    Received first dose in clinic with sample on 03/01/2022.     Follow-Up Instructions: Return in about 3 months (around 11/22/2022) for RA on ADA/MTX f/u 3mos.   Fuller Plan, MD  Note - This record has been created using [employer].  Chart creation errors have been sought, but may not always  have been located. Such creation errors do not reflect on  the standard of medical care.

## 2022-08-23 LAB — CBC WITH DIFFERENTIAL/PLATELET
Absolute Monocytes: 181 cells/uL — ABNORMAL LOW (ref 200–950)
Basophils Absolute: 39 cells/uL (ref 0–200)
Basophils Relative: 0.9 %
Eosinophils Absolute: 60 cells/uL (ref 15–500)
Eosinophils Relative: 1.4 %
HCT: 33.3 % — ABNORMAL LOW (ref 35.0–45.0)
Hemoglobin: 11 g/dL — ABNORMAL LOW (ref 11.7–15.5)
Lymphs Abs: 1492 cells/uL (ref 850–3900)
MCH: 28.9 pg (ref 27.0–33.0)
MCHC: 33 g/dL (ref 32.0–36.0)
MCV: 87.4 fL (ref 80.0–100.0)
MPV: 8.7 fL (ref 7.5–12.5)
Monocytes Relative: 4.2 %
Neutro Abs: 2528 cells/uL (ref 1500–7800)
Neutrophils Relative %: 58.8 %
Platelets: 276 10*3/uL (ref 140–400)
RBC: 3.81 10*6/uL (ref 3.80–5.10)
RDW: 14.8 % (ref 11.0–15.0)
Total Lymphocyte: 34.7 %
WBC: 4.3 10*3/uL (ref 3.8–10.8)

## 2022-08-23 LAB — COMPLETE METABOLIC PANEL WITH GFR
AG Ratio: 0.7 (calc) — ABNORMAL LOW (ref 1.0–2.5)
ALT: 14 U/L (ref 6–29)
AST: 34 U/L (ref 10–35)
Albumin: 3.5 g/dL — ABNORMAL LOW (ref 3.6–5.1)
Alkaline phosphatase (APISO): 63 U/L (ref 37–153)
BUN: 14 mg/dL (ref 7–25)
CO2: 28 mmol/L (ref 20–32)
Calcium: 9.1 mg/dL (ref 8.6–10.4)
Chloride: 105 mmol/L (ref 98–110)
Creat: 0.62 mg/dL (ref 0.50–1.03)
Globulin: 5.1 g/dL (calc) — ABNORMAL HIGH (ref 1.9–3.7)
Glucose, Bld: 81 mg/dL (ref 65–99)
Potassium: 4 mmol/L (ref 3.5–5.3)
Sodium: 138 mmol/L (ref 135–146)
Total Bilirubin: 0.3 mg/dL (ref 0.2–1.2)
Total Protein: 8.6 g/dL — ABNORMAL HIGH (ref 6.1–8.1)
eGFR: 104 mL/min/{1.73_m2} (ref >=60)

## 2022-08-23 LAB — SEDIMENTATION RATE: Sed Rate: 104 mm/h — ABNORMAL HIGH (ref 0–30)

## 2022-10-12 ENCOUNTER — Ambulatory Visit (HOSPITAL_COMMUNITY): Payer: 59 | Attending: Physician Assistant | Admitting: Physical Therapy

## 2022-10-12 ENCOUNTER — Encounter (HOSPITAL_COMMUNITY): Payer: Self-pay | Admitting: Physical Therapy

## 2022-10-12 ENCOUNTER — Other Ambulatory Visit: Payer: Self-pay

## 2022-10-12 DIAGNOSIS — R2689 Other abnormalities of gait and mobility: Secondary | ICD-10-CM | POA: Diagnosis not present

## 2022-10-12 DIAGNOSIS — M6281 Muscle weakness (generalized): Secondary | ICD-10-CM | POA: Insufficient documentation

## 2022-10-12 NOTE — Therapy (Signed)
OUTPATIENT PHYSICAL THERAPY LOWER EXTREMITY EVALUATION   Patient Name: [name] MRN: [id number] DOB:[birth year], 57 y.o., female Today's Date: 10/12/2022  END OF SESSION:  PT End of Session - 10/12/22 1433     Visit Number 1    Number of Visits 12    Date for PT Re-Evaluation 11/23/22    Authorization Type Medicaid healthy blue    Progress Note Due on Visit 10    PT Start Time 1435    PT Stop Time 1508    PT Time Calculation (min) 33 min    Activity Tolerance Patient tolerated treatment well    Behavior During Therapy WFL for tasks assessed/performed             Past Medical History:  Diagnosis Date   High cholesterol    Hypertension    Hypothyroidism    Stroke (HCC)    weakness on left side.   Vaginal Pap smear, abnormal    Past Surgical History:  Procedure Laterality Date   DILATION AND CURETTAGE OF UTERUS     LASER ABLATION CONDOLAMATA Bilateral 09/27/2017   Procedure: LASER ABLATION CONDYLOMA AND REMOVAL OF PERIANAL WART;  Surgeon: [name], MD;  Location: [facility];  Service: Gynecology;  Laterality: Bilateral;   MASS EXCISION Left 09/27/2017   Procedure: EXCISION LESION LEFT THIGH;  Surgeon: [name], MD;  Location: [facility];  Service: Gynecology;  Laterality: Left;   Patient Active Problem List   Diagnosis Date Noted   Acute respiratory failure with hypoxia (HCC) 01/15/2022   Hypokalemia 01/15/2022   Hyponatremia 01/15/2022   CAP (community acquired pneumonia) 01/14/2022   Sepsis (HCC) 01/14/2022   Pain in right knee 12/14/2021   Mild intermittent asthma without complication 07/07/2021   Normocytic anemia 12/02/2020   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/19/2020   High risk medication use 06/19/2020   Vitamin D deficiency 06/19/2020   Arthritis involving multiple sites 02/18/2018   Hyperlipidemia LDL goal <70 02/16/2018   Class 3 severe obesity due to excess calories with serious  comorbidity and body mass index (BMI) of 45.0 to 49.9 in adult (HCC) 02/16/2018   Hemiparesis and alteration of sensations as late effects of stroke (HCC) 02/16/2018   Anogenital (venereal) warts 08/04/2017   Screening for colorectal cancer 08/04/2017   Encounter for gynecological examination with Papanicolaou smear of cervix 08/04/2017   Vertigo 08/04/2017   Vertigo as late effect of stroke 08/04/2017   Smoker 06/01/2017   Acquired hypothyroidism 05/12/2017   Acute CVA (cerebrovascular accident) (HCC) 06/25/2013   Tremor of left hand 06/25/2013   Hemiparesis, left (HCC) 06/25/2013   History of CVA with residual deficit 06/25/2013   Difficulty in walking(719.7) 08/31/2011   Unstable balance 08/31/2011   Weakness of left side of body 08/31/2011   Difficulty walking 08/31/2011   Cerebral infarction (HCC) 04/07/2011   Left hand weakness 04/07/2011   Essential hypertension 04/07/2011    PCP: Tracey Harries MD  REFERRING PROVIDER: Porfirio Oar, PA  REFERRING DIAG: [phone] (ICD-10-CM) - Hemiplegia and hemiparesis following cerebral infarction affecting unspecified side R29.898 (ICD-10-CM) - Other symptoms and signs involving the musculoskeletal system M12.9 (ICD-10-CM) - Arthropathy, unspecified  THERAPY DIAG:  Other abnormalities of gait and mobility  Muscle weakness (generalized)  Rationale for Evaluation and Treatment: Rehabilitation  ONSET DATE: 4 years  SUBJECTIVE:   SUBJECTIVE STATEMENT: Patient states she wants to try to walk again. It has been about 4 years since she walked,  she was using a cane until that point. Arthritis and pain and hx CVA limited ability to walk. She uses a scooter at home to get around. She transfers independently from bed to scooter. First CVA 2013 and had second but unsure when.   PERTINENT HISTORY: Hx CVA, Seropositive RA, HTN, HLD PAIN:  Are you having pain? No  PRECAUTIONS: Fall  WEIGHT BEARING RESTRICTIONS: No  FALLS:  Has patient  fallen in last 6 months? No  OCCUPATION: not employed  PLOF: Independent with household mobility with device, Independent with transfers, Requires assistive device for independence, and Needs assistance with ADLs  PATIENT GOALS: to be able to walk   OBJECTIVE:     COGNITION: Overall cognitive status: Within functional limits for tasks assessed     SENSATION:WFL   POSTURE: rounded shoulders and forward head    LOWER EXTREMITY ROM:  Active ROM Right eval Left eval  Hip flexion    Hip extension    Hip abduction    Hip adduction    Hip internal rotation    Hip external rotation    Knee flexion    Knee extension    Ankle dorsiflexion    Ankle plantarflexion    Ankle inversion    Ankle eversion     (Blank rows = not tested)  LOWER EXTREMITY MMT:  MMT Right eval Left eval  Hip flexion 5 2-  Hip extension    Hip abduction    Hip adduction    Hip internal rotation    Hip external rotation    Knee flexion 5 3-  Knee extension 5 4+  Ankle dorsiflexion 5 0  Ankle plantarflexion    Ankle inversion    Ankle eversion     (Blank rows = not tested)    FUNCTIONAL TESTS:  Transfer STS 2 x : min assist to transfer to standing from [facility] with hemiwalker, able to stand for 1-2 minutes before fatigue  requiring return to sitting (6/10 fatigue)  GAIT: Distance walked: unable Assistive device utilized: Hemi walker Level of assistance:  n/a Comments: unable   TODAY'S TREATMENT:                                                                                                                              DATE:  10/12/22 EVAL    PATIENT EDUCATION:  Education details: Patient educated on exam findings, POC, scope of PT, HEP, and possible need for AFO. Person educated: Patient Education method: Explanation, Demonstration, and Handouts Education comprehension: verbalized understanding, returned demonstration, verbal cues required, and tactile cues required  HOME  EXERCISE PROGRAM: Begin next session  ASSESSMENT:  CLINICAL IMPRESSION: Patient a 57 y.o. y.o. female who was seen today for physical therapy evaluation and treatment for hx CVA, gait and mobility deficits. Patient presents with pain limited deficits in LE strength, ROM, endurance, activity tolerance, gait, balance, and functional mobility with ADL. Patient is having to modify and restrict ADL as  indicated by outcome measure score as well as subjective information and objective measures which is affecting overall participation. Patient will benefit from skilled physical therapy in order to improve function and reduce impairment.  OBJECTIVE IMPAIRMENTS: Abnormal gait, decreased activity tolerance, decreased balance, decreased endurance, decreased mobility, difficulty walking, decreased strength, impaired flexibility, improper body mechanics, and obesity.   ACTIVITY LIMITATIONS: carrying, lifting, bending, standing, squatting, stairs, transfers, bed mobility, hygiene/grooming, locomotion level, and caring for others  PARTICIPATION LIMITATIONS: meal prep, cleaning, laundry, shopping, community activity, and yard work  PERSONAL FACTORS: Fitness, Time since onset of injury/illness/exacerbation, and 3+ comorbidities: Hx CVA, Seropositive RA, HTN, HLD  are also affecting patient's functional outcome.   REHAB POTENTIAL: Fair    CLINICAL DECISION MAKING: Evolving/moderate complexity  EVALUATION COMPLEXITY: Moderate   GOALS: Goals reviewed with patient? Yes  SHORT TERM GOALS: Target date: 11/02/2022    Patient will be independent with HEP in order to improve functional outcomes. Baseline: Goal status: INITIAL  2.  Patient will report at least 25% improvement in symptoms for improved quality of life. Baseline: Goal status: INITIAL    LONG TERM GOALS: Target date: 11/23/2022    Patient will report at least 75% improvement in symptoms for improved quality of life. Baseline:  Goal status:  INITIAL  2.  Patient will be able to transfer from sit to stand Mod I with LRAD for improved ability to stand for pressure relief.  Baseline: min A Goal status: INITIAL  3.  Patient will be able to ambulate at least 25 feet in order to be able to ambulate to the bathroom in her home.  Baseline: unable Goal status: INITIAL  4.  Patient will be independent with advanced HEP in order to promote long term functional mobility.  Baseline:  Goal status: INITIAL     PLAN:  PT FREQUENCY: 2x/week  PT DURATION: 6 weeks  PLANNED INTERVENTIONS: Therapeutic exercises, Therapeutic activity, Neuromuscular re-education, Balance training, Gait training, Patient/Family education, Joint manipulation, Joint mobilization, Stair training, Orthotic/Fit training, DME instructions, Aquatic Therapy, Dry Needling, Electrical stimulation, Spinal manipulation, Spinal mobilization, Cryotherapy, Moist heat, Compression bandaging, scar mobilization, Splintting, Taping, Traction, Ultrasound, Ionotophoresis 4mg /ml Dexamethasone, and Manual therapy  PLAN FOR NEXT SESSION: LE strengthening, standing, balance training, progress to ambulating if able.    Reola Mosher Evelynne Spiers, PT 10/12/2022, 3:06 PM

## 2022-10-19 ENCOUNTER — Ambulatory Visit (HOSPITAL_COMMUNITY): Payer: 59

## 2022-10-19 ENCOUNTER — Telehealth (HOSPITAL_COMMUNITY): Payer: Self-pay

## 2022-10-19 NOTE — Telephone Encounter (Signed)
No show, called and spoke to daughter who stated she was unaware of apt today. Reminded next apt date and time, daughter requested to have all apts in the afternoon due to work schedule, call was transfered to front desk to discuss with Diplomatic Services operational officer.   Becky Sax, LPTA/CLT; [place] [phone]

## 2022-10-24 ENCOUNTER — Ambulatory Visit (HOSPITAL_COMMUNITY): Payer: 59

## 2022-10-24 DIAGNOSIS — M6281 Muscle weakness (generalized): Secondary | ICD-10-CM | POA: Diagnosis not present

## 2022-10-24 DIAGNOSIS — R2689 Other abnormalities of gait and mobility: Secondary | ICD-10-CM | POA: Diagnosis not present

## 2022-10-24 NOTE — Therapy (Addendum)
OUTPATIENT PHYSICAL THERAPY LOWER EXTREMITY EVALUATION   Patient Name: [name] MRN: [id number] DOB:03-Feb-1966, 57 y.o., female Today's Date: 10/24/2022  END OF SESSION:  PT End of Session - 10/24/22 1433     Visit Number 2    Number of Visits 12    Date for PT Re-Evaluation 11/23/22    Authorization Type Medicaid healthy [employer]    Progress Note Due on Visit 10    PT Start Time 1433    PT Stop Time 1513    PT Time Calculation (min) 40 min    Activity Tolerance Patient tolerated treatment well    Behavior During Therapy WFL for tasks assessed/performed             Past Medical History:  Diagnosis Date   High cholesterol    Hypertension    Hypothyroidism    Stroke (HCC)    weakness on left side.   Vaginal Pap smear, abnormal    Past Surgical History:  Procedure Laterality Date   DILATION AND CURETTAGE OF UTERUS     LASER ABLATION CONDOLAMATA Bilateral 09/27/2017   Procedure: LASER ABLATION CONDYLOMA AND REMOVAL OF PERIANAL WART;  Surgeon: [name], MD;  Location: [facility];  Service: Gynecology;  Laterality: Bilateral;   MASS EXCISION Left 09/27/2017   Procedure: EXCISION LESION LEFT THIGH;  Surgeon: [name], MD;  Location: [facility];  Service: Gynecology;  Laterality: Left;   Patient Active Problem List   Diagnosis Date Noted   Acute respiratory failure with hypoxia (HCC) 01/15/2022   Hypokalemia 01/15/2022   Hyponatremia 01/15/2022   CAP (community acquired pneumonia) 01/14/2022   Sepsis (HCC) 01/14/2022   Pain in right knee 12/14/2021   Mild intermittent asthma without complication 07/07/2021   Normocytic anemia 12/02/2020   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/19/2020   High risk medication use 06/19/2020   Vitamin D deficiency 06/19/2020   Arthritis involving multiple sites 02/18/2018   Hyperlipidemia LDL goal <70 02/16/2018   Class 3 severe obesity due to excess calories with serious  comorbidity and body mass index (BMI) of 45.0 to 49.9 in adult (HCC) 02/16/2018   Hemiparesis and alteration of sensations as late effects of stroke (HCC) 02/16/2018   Anogenital (venereal) warts 08/04/2017   Screening for colorectal cancer 08/04/2017   Encounter for gynecological examination with Papanicolaou smear of cervix 08/04/2017   Vertigo 08/04/2017   Vertigo as late effect of stroke 08/04/2017   Smoker 06/01/2017   Acquired hypothyroidism 05/12/2017   Acute CVA (cerebrovascular accident) (HCC) 06/25/2013   Tremor of left hand 06/25/2013   Hemiparesis, left (HCC) 06/25/2013   History of CVA with residual deficit 06/25/2013   Difficulty in walking(719.7) 08/31/2011   Unstable balance 08/31/2011   Weakness of left side of body 08/31/2011   Difficulty walking 08/31/2011   Cerebral infarction (HCC) 04/07/2011   Left hand weakness 04/07/2011   Essential hypertension 04/07/2011    PCP: Tracey Harries MD  REFERRING PROVIDER: Porfirio Oar, PA  REFERRING DIAG: [phone] (ICD-10-CM) - Hemiplegia and hemiparesis following cerebral infarction affecting unspecified side R29.898 (ICD-10-CM) - Other symptoms and signs involving the musculoskeletal system M12.9 (ICD-10-CM) - Arthropathy, unspecified  THERAPY DIAG:  Other abnormalities of gait and mobility  Muscle weakness (generalized)  Rationale for Evaluation and Treatment: Rehabilitation  ONSET DATE: 4 years  SUBJECTIVE:   SUBJECTIVE STATEMENT: Patient reports she is "fine" no pain today.   No new falls; patient states she is unable  to slide over in the  bed and sleeps on the edge.     Eval:Patient states she wants to try to walk again. It has been about 4 years since she walked, she was using a cane until that point. Arthritis and pain and hx CVA limited ability to walk. She uses a scooter at home to get around. She transfers independently from bed to scooter. First CVA 2013 and had second but unsure when.   PERTINENT  HISTORY: Hx CVA, Seropositive RA, HTN, HLD PAIN:  Are you having pain? No  PRECAUTIONS: Fall  WEIGHT BEARING RESTRICTIONS: No  FALLS:  Has patient fallen in last 6 months? No  OCCUPATION: not employed  PLOF: Independent with household mobility with device, Independent with transfers, Requires assistive device for independence, and Needs assistance with ADLs  PATIENT GOALS: to be able to walk   OBJECTIVE:     COGNITION: Overall cognitive status: Within functional limits for tasks assessed     SENSATION:WFL   POSTURE: rounded shoulders and forward head    LOWER EXTREMITY ROM:  Active ROM Right eval Left eval  Hip flexion    Hip extension    Hip abduction    Hip adduction    Hip internal rotation    Hip external rotation    Knee flexion    Knee extension    Ankle dorsiflexion    Ankle plantarflexion    Ankle inversion    Ankle eversion     (Blank rows = not tested)  LOWER EXTREMITY MMT:  MMT Right eval Left eval  Hip flexion 5 2-  Hip extension    Hip abduction    Hip adduction    Hip internal rotation    Hip external rotation    Knee flexion 5 3-  Knee extension 5 4+  Ankle dorsiflexion 5 0  Ankle plantarflexion    Ankle inversion    Ankle eversion     (Blank rows = not tested)    FUNCTIONAL TESTS:  Transfer STS 2 x : min assist to transfer to standing from [facility] with hemiwalker, able to stand for 1-2 minutes before fatigue  requiring return to sitting (6/10 fatigue)  GAIT: Distance walked: unable Assistive device utilized: Hemi walker Level of assistance:  n/a Comments: unable   TODAY'S TREATMENT:                                                                                                                              DATE:  10/24/22 Review of goals Seated in WC: Left hip flexion x 5 LAQ's 2 x 5 Ankle dorsiflexion AAROM 2 x 5 Ankle dorsiflexion with strap x 5  Sit to stand min A to // bars (right hand holding) Static standing  2 x 1 min Standing heel raises on right (left unable) Standing weight shifts x 10 Standing left leg flexion x 5     10/12/22 EVAL    PATIENT EDUCATION:  Education  details: Patient educated on exam findings, POC, scope of PT, HEP, and possible need for AFO. Person educated: Patient Education method: Explanation, Demonstration, and Handouts Education comprehension: verbalized understanding, returned demonstration, verbal cues required, and tactile cues required  HOME EXERCISE PROGRAM: Begin next session  ASSESSMENT:  CLINICAL IMPRESSION: Today's session started with a review of goals.  Patient verbalizes agreement with set rehab goals.  Initiated HEP.  Encouraged patient to stand at sink with assistance for safety 3 times a day.  Patient needs cues for improved posturing with standing. Patient unable to perform heel raise on left foot in standing and unable to actively flex the hip and knee in standing; shifts to right to clear left foot.  Discussed AFO and half bed railing with patient today and issued information on a local DME company.   Patient will benefit from continued skilled therapy services  to address deficits and promote return to optimal function.       Eval:Patient a 57 y.o. y.o. female who was seen today for physical therapy evaluation and treatment for hx CVA, gait and mobility deficits. Patient presents with pain limited deficits in LE strength, ROM, endurance, activity tolerance, gait, balance, and functional mobility with ADL. Patient is having to modify and restrict ADL as indicated by outcome measure score as well as subjective information and objective measures which is affecting overall participation. Patient will benefit from skilled physical therapy in order to improve function and reduce impairment.  OBJECTIVE IMPAIRMENTS: Abnormal gait, decreased activity tolerance, decreased balance, decreased endurance, decreased mobility, difficulty walking, decreased  strength, impaired flexibility, improper body mechanics, and obesity.   ACTIVITY LIMITATIONS: carrying, lifting, bending, standing, squatting, stairs, transfers, bed mobility, hygiene/grooming, locomotion level, and caring for others  PARTICIPATION LIMITATIONS: meal prep, cleaning, laundry, shopping, community activity, and yard work  PERSONAL FACTORS: Fitness, Time since onset of injury/illness/exacerbation, and 3+ comorbidities: Hx CVA, Seropositive RA, HTN, HLD  are also affecting patient's functional outcome.   REHAB POTENTIAL: Fair    CLINICAL DECISION MAKING: Evolving/moderate complexity  EVALUATION COMPLEXITY: Moderate   GOALS: Goals reviewed with patient? Yes  SHORT TERM GOALS: Target date: 11/02/2022    Patient will be independent with HEP in order to improve functional outcomes. Baseline: Goal status: IN PROGRESS  2.  Patient will report at least 25% improvement in symptoms for improved quality of life. Baseline: Goal status: IN PROGRESS    LONG TERM GOALS: Target date: 11/23/2022    Patient will report at least 75% improvement in symptoms for improved quality of life. Baseline:  Goal status: IN PROGRESS  2.  Patient will be able to transfer from sit to stand Mod I with LRAD for improved ability to stand for pressure relief.  Baseline: min A Goal status: IN PROGRESS  3.  Patient will be able to ambulate at least 25 feet in order to be able to ambulate to the bathroom in her home.  Baseline: unable Goal status: IN PROGRESS  4.  Patient will be independent with advanced HEP in order to promote long term functional mobility.  Baseline:  Goal status: IN PROGRESS     PLAN:  PT FREQUENCY: 2x/week  PT DURATION: 6 weeks  PLANNED INTERVENTIONS: Therapeutic exercises, Therapeutic activity, Neuromuscular re-education, Balance training, Gait training, Patient/Family education, Joint manipulation, Joint mobilization, Stair training, Orthotic/Fit training, DME  instructions, Aquatic Therapy, Dry Needling, Electrical stimulation, Spinal manipulation, Spinal mobilization, Cryotherapy, Moist heat, Compression bandaging, scar mobilization, Splintting, Taping, Traction, Ultrasound, Ionotophoresis 4mg /ml Dexamethasone, and Manual therapy  PLAN FOR NEXT SESSION: LE strengthening, standing, balance training, progress to ambulating if able. May benefit from mat exercise   3:18 PM, 10/24/22 Ameris Akamine Small Shandel Busic MPT [place] [facility] [place] [phone]

## 2022-10-26 ENCOUNTER — Encounter (HOSPITAL_COMMUNITY): Payer: 59

## 2022-10-28 ENCOUNTER — Encounter (HOSPITAL_COMMUNITY): Payer: 59

## 2022-11-01 ENCOUNTER — Encounter (HOSPITAL_COMMUNITY): Payer: 59 | Admitting: Physical Therapy

## 2022-11-03 ENCOUNTER — Ambulatory Visit (HOSPITAL_COMMUNITY): Payer: Medicaid Other | Attending: Physician Assistant | Admitting: Physical Therapy

## 2022-11-03 ENCOUNTER — Encounter (HOSPITAL_COMMUNITY): Payer: 59 | Admitting: Physical Therapy

## 2022-11-03 ENCOUNTER — Telehealth (HOSPITAL_COMMUNITY): Payer: Self-pay | Admitting: Physical Therapy

## 2022-11-03 DIAGNOSIS — Z79899 Other long term (current) drug therapy: Secondary | ICD-10-CM | POA: Insufficient documentation

## 2022-11-03 DIAGNOSIS — M059 Rheumatoid arthritis with rheumatoid factor, unspecified: Secondary | ICD-10-CM | POA: Insufficient documentation

## 2022-11-03 DIAGNOSIS — M25561 Pain in right knee: Secondary | ICD-10-CM | POA: Insufficient documentation

## 2022-11-03 DIAGNOSIS — G8929 Other chronic pain: Secondary | ICD-10-CM | POA: Insufficient documentation

## 2022-11-03 DIAGNOSIS — M6281 Muscle weakness (generalized): Secondary | ICD-10-CM | POA: Insufficient documentation

## 2022-11-03 DIAGNOSIS — R2689 Other abnormalities of gait and mobility: Secondary | ICD-10-CM | POA: Insufficient documentation

## 2022-11-03 NOTE — Telephone Encounter (Signed)
Pt did not show for appt.  Arrived 35 minutes late and could not be seen today.  Front staff informed of attendance policy to both patient and CG.  Lurena Nida, PTA/CLT [facility] Ph: [phone]

## 2022-11-08 ENCOUNTER — Encounter (HOSPITAL_COMMUNITY): Payer: 59 | Admitting: Physical Therapy

## 2022-11-08 NOTE — Progress Notes (Signed)
Office Visit Note  Patient: [name]             Date of Birth: [birth year]           MRN: [id number]             PCP: Tracey Harries, MD Referring: Tracey Harries, MD Visit Date: 11/22/2022   Subjective:  Follow-up   History of Present Illness: [name] is a 57 y.o. female here for follow up for seropositive RA on Humira 40 mg subcu q. 14 days and methotrexate 15 mg p.o. weekly and folic acid 1 mg daily.  She has been doing some work with physical therapy now mobility is slightly improved doing better at getting herself back and forth to the bathroom and moving around at home.  Does feel like her leg pain is worse with increased use.  Is having some increased shoulder pain at night as well.  Previous HPI 08/22/2022 [name] is a 57 y.o. female here for follow up for seropositive RA on Humira 40 mg subcu q. 14 days and methotrexate 15 mg p.o. weekly and folic acid 1 mg daily.  Overall she feels symptoms are doing okay without major exacerbation.  She still has very limited mobility and did not start any formal work with physical therapy yet about her leg weakness and limited walking.  With weakness on left side gets some more joint pain and swelling in her right hand and right knee has not had any falls.   Previous HPI 05/24/22 [name] is a 57 y.o. female here for follow up for seropositive RA on Humira 40 mg subcu q. 14 days and methotrexate 15 mg p.o. weekly folic acid 1 mg daily.  Since her last visit she had 1 hospitalization for severe illness with sepsis requiring IV antibiotic treatment but she recovered entirely after this.  She has been doing reasonably well without severe RA flareups off of any maintenance steroids for at least 3 months.  Started on Humira in November taking her injections subcutaneous on the abdomen with no problems.  No serious infections since starting this medication.  She ran out of the methotrexate within the past few weeks due to missing some  scheduled clinic follow-up.  She remains mostly immobile without much progress walking or doing any regular exercises towards this goal.   Her husband is present at visit today assisting with transportation.    Previous HPI 12/14/21 [name] is a 57 y.o. female here for follow up for seropositive RA on MTX 15 mg PO weekly and folic acid 1 mg daily. She continues to have swelling in her hands but not much pain unless tightly gripping or under pressure. Her right knee pain limits weightbearing severely and she has not been able to travel even 5-10 ft distances to the bathroom consistently. She is not sure whether this knee swells usually. 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   Previous HPI [name] is a 57 y.o. female here for follow up for seropositive RA on MTX 20 mg PO weekly and folic acid 1 mg daily. She took a course of oral prednisone after last visit in November but joint symptoms have been reasonably controlled on just methotrexate since then. We had discussed possible addition of Humira. She remains limited in mobility mostly due to left leg weakness and stiffness, using a cane at home.   Previous HPI 02/10/21 [name] is a 57 y.o. female  here for telehealth visit by video for follow up of her seropositive rheumatoid arthritis on methotrexate 20 mg PO weekly. She is experiencing increased joint pain and swelling for the past 2 to 3 weeks unable to tightly close her right hand and cannot bear weight enough to assist in transferring out of bed and wheelchair. She reports feeling better each week after taking methotrexate but quickly worsens again within a few days and this has been worsening. Otherwise no new side effect or health changes. We reviewed her previous clinic visit still had some ongoing swelling and very high sedimentation rate tests but the pain and mobility is a lot worse.   Previous HPI 06/19/20 [name] is a 57 y.o.  female with history of hypothyroidism, CVA with residual left hemiparesis, HTN, HLD here for evaluation of arthritis of multiple joints and positive rheumatoid factor. She has symptoms for some amount of time at least 6 months, swelling, and stiffness of joints on the right half of her body including elbow, wrist, fingers, and knee. She was evaluated in PCP clinic with highly positive inflammatory markers and RA antibodies with swollen joints noted on exam in 11/2019. She also had positive ANA and RNP antibodies. Xray of the right hand was checked and reportedly not show other cause of joint pain and no erosion. She has used some topical diclofenac without much improvement and tried tramadol also without much improvement. She has become unable to walk much due to severe pain bearing weight on her right knee and the left leg is too weak. Today she says symptoms are doing better compared to 2 months ago severity. She denies any complaints of hair loss, rashes, lymphadenopathy, oral ulcers, raynaud's.    Labs reviewed 11/2019 ESR 114 CRP 36 RF 421.6 CCP >250 ANA pos RNP >8.0 DsDNA, Sm, SSA, SSB neg Vit D 12.7   Review of Systems  Constitutional:  Negative for fatigue.  HENT:  Negative for mouth sores and mouth dryness.   Eyes:  Negative for dryness.  Respiratory:  Negative for shortness of breath.   Cardiovascular:  Negative for chest pain and palpitations.  Gastrointestinal:  Negative for blood in stool, constipation and diarrhea.  Endocrine: Negative for increased urination.  Genitourinary:  Negative for involuntary urination.  Musculoskeletal:  Positive for gait problem and joint swelling. Negative for joint pain, joint pain, myalgias, muscle weakness, morning stiffness, muscle tenderness and myalgias.  Skin:  Negative for color change, rash, hair loss and sensitivity to sunlight.  Allergic/Immunologic: Negative for susceptible to infections.  Neurological:  Negative for dizziness and  headaches.  Hematological:  Negative for swollen glands.  Psychiatric/Behavioral:  Negative for depressed mood and sleep disturbance. The patient is not nervous/anxious.     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/19/2020   High risk medication use 06/19/2020   Vitamin D deficiency 06/19/2020   Arthritis involving multiple sites 02/18/2018   Hyperlipidemia LDL goal <70 02/16/2018   Class 3 severe obesity due to excess calories with serious comorbidity and body mass index (BMI) of 45.0 to 49.9 in adult [facility]) 02/16/2018  Hemiparesis and alteration of sensations as late effects of stroke (HCC) 02/16/2018   Anogenital (venereal) warts 08/04/2017   Screening for colorectal cancer 08/04/2017   Encounter for gynecological examination with Papanicolaou smear of cervix 08/04/2017   Vertigo 08/04/2017   Vertigo as late effect of stroke 08/04/2017   Smoker 06/01/2017   Acquired hypothyroidism 05/12/2017   Acute CVA (cerebrovascular accident) (HCC) 06/25/2013   Tremor of left hand 06/25/2013   Hemiparesis, left (HCC) 06/25/2013   History of CVA with residual deficit 06/25/2013   Difficulty in walking(719.7) 08/31/2011   Unstable balance 08/31/2011   Weakness of left side of body 08/31/2011   Difficulty walking 08/31/2011   Cerebral infarction (HCC) 04/07/2011   Left hand weakness 04/07/2011   Essential hypertension 04/07/2011    Past Medical History:  Diagnosis Date   High cholesterol    Hypertension    Hypothyroidism    Stroke (HCC)    weakness on left side.    Vaginal Pap smear, abnormal     Family History  Problem Relation Age of Onset   Hypertension Mother    Liver disease Mother    Heart attack Father    HIV Sister    Arrhythmia Sister    Mood Disorder Sister    Arrhythmia Brother    CAD Brother    Past Surgical History:  Procedure Laterality Date   DILATION AND CURETTAGE OF UTERUS     LASER ABLATION CONDOLAMATA Bilateral 09/27/2017   Procedure: LASER ABLATION CONDYLOMA AND REMOVAL OF PERIANAL WART;  Surgeon: [name], MD;  Location: [facility];  Service: Gynecology;  Laterality: Bilateral;   MASS EXCISION Left 09/27/2017   Procedure: EXCISION LESION LEFT THIGH;  Surgeon: [name], MD;  Location: [facility];  Service: Gynecology;  Laterality: Left;   Social History   Social History Narrative   Grew up in [place], [place].   Engaged to be married.   5 children.    Eats mainly meat/junk food.   Immunization History  Administered Date(s) Administered   COVID-19, mRNA, vaccine(Comirnaty)12 years and older 01/25/2022   PFIZER Comirnaty(Gray Top)Covid-19 Tri-Sucrose Vaccine 08/26/2020   PFIZER(Purple Top)SARS-COV-2 Vaccination 12/12/2019, 01/02/2020     Objective: Vital Signs: BP (!) 144/86 (BP Location: Right Arm, Patient Position: Sitting, Cuff Size: Normal)   Pulse 86   Resp 14   Ht 5\' 6"  (1.676 m)   Wt 283 lb (128.4 kg) Comment: patient in wheelchair  LMP 07/29/2012   BMI 45.68 kg/m    Physical Exam Constitutional:      Appearance: She is obese.     Comments: In wheelchair  Cardiovascular:     Rate and Rhythm: Normal rate and regular rhythm.  Pulmonary:     Effort: Pulmonary effort is normal.     Breath sounds: Normal breath sounds.  Skin:    General: Skin is warm and dry.     Findings: No rash.  Neurological:     Mental Status: She is alert.  Psychiatric:        Mood and Affect: Mood normal.      Musculoskeletal Exam:  Right shoulder tenderness to pressure, some pain provoked with full overhead  abduction Right elbow mildly restricted flexion extension range of motion, no palpable swelling or focal tenderness Right hand second third MCP joint swelling with minimal tenderness to pressure, full grip range of motion intact Left arm hemiparesis with decreased passive ROM shoulder and fingers, and negligible passive range of motion in elbow and wrist Right  knee with full range of motion, crepitus but no tenderness to palpation, no palpable effusion Left knee restricted flexion and extension range of motion  CDAI Exam: CDAI Score: 8  Patient Global: 20 / 100; Provider Global: 30 / 100 Swollen: 2 ; Tender: 1  Joint Exam 11/22/2022      Right  Left  Glenohumeral   Tender     MCP 2  Swollen      MCP 3  Swollen           Investigation: No additional findings.  Imaging: No results found.  Recent Labs: Lab Results  Component Value Date   WBC 4.1 11/22/2022   HGB 11.2 (L) 11/22/2022   PLT 297 11/22/2022   NA 137 11/22/2022   K 3.9 11/22/2022   CL 107 11/22/2022   CO2 25 11/22/2022   GLUCOSE 87 11/22/2022   BUN 13 11/22/2022   CREATININE 0.59 11/22/2022   BILITOT 0.2 11/22/2022   ALKPHOS 52 01/15/2022   AST 36 (H) 11/22/2022   ALT 10 11/22/2022   PROT 8.4 (H) 11/22/2022   ALBUMIN 2.2 (L) 01/15/2022   CALCIUM 8.8 11/22/2022   GFRAA 115 07/29/2020   QFTBGOLDPLUS NEGATIVE 11/22/2022    Speciality Comments: No specialty comments available.  Procedures:  No procedures performed Allergies: Lisinopril   Assessment / Plan:     Visit Diagnoses: Seropositive rheumatoid arthritis (HCC) - Plan: Sedimentation rate, methotrexate (RHEUMATREX) 2.5 MG tablet, folic acid (FOLVITE) 1 MG tablet, adalimumab (HUMIRA, 2 PEN,) 40 MG/0.4ML pen  Clinically appears to be in mild disease activity with a few swollen joints.  Rechecking sedimentation rate for disease activity monitoring her has has been markedly and persistently elevated relative to the evident disease activity.  Plan to  continue Humira 40 mg subcu q. 14 days methotrexate 15 mg p.o. weekly and folic acid 1 mg daily.  High risk medication use - Humira 40 mg subcu q. 14 days and methotrexate 15 mg p.o. weekly folic acid 1 mg daily. - Plan: CBC with Differential/Platelet, COMPLETE METABOLIC PANEL WITH GFR, QuantiFERON-TB Gold Plus  Checking CBC and CMP and checking QuantiFERON for medication monitoring on long-term continued use of Humira and methotrexate.  No serious interval infections.  Chronic pain of right knee  Suspect more related to some osteoarthritis and her overall deconditioning and obesity with relying on this knee is the primary weightbearing side while walking.  No appreciable effusion on exam to suggest coming from RA disease activity.  Orders: Orders Placed This Encounter  Procedures   Sedimentation rate   CBC with Differential/Platelet   COMPLETE METABOLIC PANEL WITH GFR   QuantiFERON-TB Gold Plus   Meds ordered this encounter  Medications   methotrexate (RHEUMATREX) 2.5 MG tablet    Sig: Take 6 tablets (15 mg total) by mouth once a week. Caution:Chemotherapy. Protect from light.    Dispense:  78 tablet    Refill:  0   folic acid (FOLVITE) 1 MG tablet    Sig: Take 1 tablet (1 mg total) by mouth daily.    Dispense:  90 tablet    Refill:  3   adalimumab (HUMIRA, 2 PEN,) 40 MG/0.4ML pen    Sig: Inject 0.4 mLs (40 mg total) into the skin every 14 (fourteen) days.    Dispense:  2 each    Refill:  2     Follow-Up Instructions: Return in about 3 months (around 02/22/2023) for RA on ADA/MTX f/u 3mos.   Fuller Plan, MD  Note - This record has been created using [employer].  Chart creation errors have been sought, but may not always  have been located. Such creation errors do not reflect on  the standard of medical care.

## 2022-11-09 ENCOUNTER — Ambulatory Visit (HOSPITAL_COMMUNITY): Payer: Medicaid Other

## 2022-11-09 DIAGNOSIS — R2689 Other abnormalities of gait and mobility: Secondary | ICD-10-CM | POA: Diagnosis present

## 2022-11-09 DIAGNOSIS — G8929 Other chronic pain: Secondary | ICD-10-CM | POA: Diagnosis present

## 2022-11-09 DIAGNOSIS — M6281 Muscle weakness (generalized): Secondary | ICD-10-CM | POA: Diagnosis present

## 2022-11-09 DIAGNOSIS — Z79899 Other long term (current) drug therapy: Secondary | ICD-10-CM | POA: Diagnosis present

## 2022-11-09 DIAGNOSIS — M059 Rheumatoid arthritis with rheumatoid factor, unspecified: Secondary | ICD-10-CM | POA: Diagnosis present

## 2022-11-09 DIAGNOSIS — M25561 Pain in right knee: Secondary | ICD-10-CM | POA: Diagnosis present

## 2022-11-09 NOTE — Therapy (Signed)
OUTPATIENT PHYSICAL THERAPY LOWER EXTREMITY EVALUATION   Patient Name: [name] MRN: [id number] DOB:1966/01/10, 57 y.o., female Today's Date: 11/09/2022  END OF SESSION:  PT End of Session - 11/09/22 1603     Visit Number 3    Number of Visits 12    Date for PT Re-Evaluation 11/23/22    Authorization Type Medicaid healthy blue    Progress Note Due on Visit 10    PT Start Time 1520    PT Stop Time 1600    PT Time Calculation (min) 40 min    Equipment Utilized During Treatment Gait belt    Activity Tolerance Patient tolerated treatment well    Behavior During Therapy WFL for tasks assessed/performed              Past Medical History:  Diagnosis Date   High cholesterol    Hypertension    Hypothyroidism    Stroke (HCC)    weakness on left side.   Vaginal Pap smear, abnormal    Past Surgical History:  Procedure Laterality Date   DILATION AND CURETTAGE OF UTERUS     LASER ABLATION CONDOLAMATA Bilateral 09/27/2017   Procedure: LASER ABLATION CONDYLOMA AND REMOVAL OF PERIANAL WART;  Surgeon: [name], MD;  Location: [facility];  Service: Gynecology;  Laterality: Bilateral;   MASS EXCISION Left 09/27/2017   Procedure: EXCISION LESION LEFT THIGH;  Surgeon: [name], MD;  Location: [facility];  Service: Gynecology;  Laterality: Left;   Patient Active Problem List   Diagnosis Date Noted   Acute respiratory failure with hypoxia (HCC) 01/15/2022   Hypokalemia 01/15/2022   Hyponatremia 01/15/2022   CAP (community acquired pneumonia) 01/14/2022   Sepsis (HCC) 01/14/2022   Pain in right knee 12/14/2021   Mild intermittent asthma without complication 07/07/2021   Normocytic anemia 12/02/2020   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/19/2020   High risk medication use 06/19/2020   Vitamin D deficiency 06/19/2020   Arthritis involving multiple sites 02/18/2018   Hyperlipidemia LDL goal <70 02/16/2018   Class 3 severe  obesity due to excess calories with serious comorbidity and body mass index (BMI) of 45.0 to 49.9 in adult (HCC) 02/16/2018   Hemiparesis and alteration of sensations as late effects of stroke (HCC) 02/16/2018   Anogenital (venereal) warts 08/04/2017   Screening for colorectal cancer 08/04/2017   Encounter for gynecological examination with Papanicolaou smear of cervix 08/04/2017   Vertigo 08/04/2017   Vertigo as late effect of stroke 08/04/2017   Smoker 06/01/2017   Acquired hypothyroidism 05/12/2017   Acute CVA (cerebrovascular accident) (HCC) 06/25/2013   Tremor of left hand 06/25/2013   Hemiparesis, left (HCC) 06/25/2013   History of CVA with residual deficit 06/25/2013   Difficulty in walking(719.7) 08/31/2011   Unstable balance 08/31/2011   Weakness of left side of body 08/31/2011   Difficulty walking 08/31/2011   Cerebral infarction (HCC) 04/07/2011   Left hand weakness 04/07/2011   Essential hypertension 04/07/2011    PCP: Tracey Harries MD  REFERRING PROVIDER: Porfirio Oar, PA  REFERRING DIAG: [phone] (ICD-10-CM) - Hemiplegia and hemiparesis following cerebral infarction affecting unspecified side R29.898 (ICD-10-CM) - Other symptoms and signs involving the musculoskeletal system M12.9 (ICD-10-CM) - Arthropathy, unspecified  THERAPY DIAG:  Other abnormalities of gait and mobility  Muscle weakness (generalized)  Rationale for Evaluation and Treatment: Rehabilitation  ONSET DATE: 4 years  SUBJECTIVE:   SUBJECTIVE STATEMENT: No pain today. Reports that she is getting  around better due to arthitis shot kicking in. Pt reports being able to accommodate sleeping arrangements and getting into bed more.    Eval:Patient states she wants to try to walk again. It has been about 4 years since she walked, she was using a cane until that point. Arthritis and pain and hx CVA limited ability to walk. She uses a scooter at home to get around. She transfers independently from bed to  scooter. First CVA 2013 and had second but unsure when.   PERTINENT HISTORY: Hx CVA, Seropositive RA, HTN, HLD PAIN:  Are you having pain? No  PRECAUTIONS: Fall  WEIGHT BEARING RESTRICTIONS: No  FALLS:  Has patient fallen in last 6 months? No  OCCUPATION: not employed  PLOF: Independent with household mobility with device, Independent with transfers, Requires assistive device for independence, and Needs assistance with ADLs  PATIENT GOALS: to be able to walk   OBJECTIVE:     COGNITION: Overall cognitive status: Within functional limits for tasks assessed     SENSATION:WFL   POSTURE: rounded shoulders and forward head    LOWER EXTREMITY ROM:  Active ROM Right eval Left eval  Hip flexion    Hip extension    Hip abduction    Hip adduction    Hip internal rotation    Hip external rotation    Knee flexion    Knee extension    Ankle dorsiflexion    Ankle plantarflexion    Ankle inversion    Ankle eversion     (Blank rows = not tested)  LOWER EXTREMITY MMT:  MMT Right eval Left eval  Hip flexion 5 2-  Hip extension    Hip abduction    Hip adduction    Hip internal rotation    Hip external rotation    Knee flexion 5 3-  Knee extension 5 4+  Ankle dorsiflexion 5 0  Ankle plantarflexion    Ankle inversion    Ankle eversion     (Blank rows = not tested)    FUNCTIONAL TESTS:  Transfer STS 2 x : min assist to transfer to standing from [facility] with hemiwalker, able to stand for 1-2 minutes before fatigue  requiring return to sitting (6/10 fatigue)  GAIT: Distance walked: unable Assistive device utilized: Hemi walker Level of assistance:  n/a Comments: unable   TODAY'S TREATMENT:                                                                                                                              DATE:  11/09/2022  -Sit/stands from WC; RUE support CGA <> min assist, multimodal cues provided for symmetrical weight shift.  -Standing weight shifts  in parallel bars with tactile cues at pelvis for Left lateral weight cues; provided facilitation into proper shift.  -Attempting single leg weight shift onto LLE 3 x 20 with heavy tactile faciliation onto L side with L knee blocking. 3 reps of last set able to  hold for 3 seconds. CGA<>Min assist @ pelvis when shifting over to L side.  -Standing static holds with 2 and 4in box under RLE 2 x 30 seconds with 2in box; 1x 30 second on 4in box; min assist required with constant cues for motivation. LLE fatiguing limiting tolerance.   10/24/22 Review of goals Seated in WC: Left hip flexion x 5 LAQ's 2 x 5 Ankle dorsiflexion AAROM 2 x 5 Ankle dorsiflexion with strap x 5  Sit to stand min A to // bars (right hand holding) Static standing 2 x 1 min Standing heel raises on right (left unable) Standing weight shifts x 10 Standing left leg flexion x 5     10/12/22 EVAL    PATIENT EDUCATION:  Education details: Patient educated on exam findings, POC, scope of PT, HEP, and possible need for AFO. Person educated: Patient Education method: Explanation, Demonstration, and Handouts Education comprehension: verbalized understanding, returned demonstration, verbal cues required, and tactile cues required  HOME EXERCISE PROGRAM: Begin next session  ASSESSMENT:  CLINICAL IMPRESSION: Pt tolerating session well, fatiguing in a short time frame, limited up right tolerance and progression. Pt lacking confidence in LLE and limited forward progression of steps in parallel bars. Educated pt on safety measure to ensure reducing risks of falls. Pt is adjusting well to tactile cues and facilitation and improves performance with multiple repetitions. Pt will continue need to practice L shifts before initiating more stepping. Constant direction on avoiding high level activities at home without therapist as safety concern. Pt was wanting to perform activities at home and therapist educated on importance on doing  these dedicated activities at home alone. Patient will benefit from continued skilled therapy services  to address deficits and promote return to optimal function.       Eval:Patient a 57 y.o. y.o. female who was seen today for physical therapy evaluation and treatment for hx CVA, gait and mobility deficits. Patient presents with pain limited deficits in LE strength, ROM, endurance, activity tolerance, gait, balance, and functional mobility with ADL. Patient is having to modify and restrict ADL as indicated by outcome measure score as well as subjective information and objective measures which is affecting overall participation. Patient will benefit from skilled physical therapy in order to improve function and reduce impairment.  OBJECTIVE IMPAIRMENTS: Abnormal gait, decreased activity tolerance, decreased balance, decreased endurance, decreased mobility, difficulty walking, decreased strength, impaired flexibility, improper body mechanics, and obesity.   ACTIVITY LIMITATIONS: carrying, lifting, bending, standing, squatting, stairs, transfers, bed mobility, hygiene/grooming, locomotion level, and caring for others  PARTICIPATION LIMITATIONS: meal prep, cleaning, laundry, shopping, community activity, and yard work  PERSONAL FACTORS: Fitness, Time since onset of injury/illness/exacerbation, and 3+ comorbidities: Hx CVA, Seropositive RA, HTN, HLD  are also affecting patient's functional outcome.   REHAB POTENTIAL: Fair    CLINICAL DECISION MAKING: Evolving/moderate complexity  EVALUATION COMPLEXITY: Moderate   GOALS: Goals reviewed with patient? Yes  SHORT TERM GOALS: Target date: 11/02/2022    Patient will be independent with HEP in order to improve functional outcomes. Baseline: Goal status: IN PROGRESS  2.  Patient will report at least 25% improvement in symptoms for improved quality of life. Baseline: Goal status: IN PROGRESS    LONG TERM GOALS: Target date: 11/23/2022     Patient will report at least 75% improvement in symptoms for improved quality of life. Baseline:  Goal status: IN PROGRESS  2.  Patient will be able to transfer from sit to stand Mod I with LRAD for  improved ability to stand for pressure relief.  Baseline: min A Goal status: IN PROGRESS  3.  Patient will be able to ambulate at least 25 feet in order to be able to ambulate to the bathroom in her home.  Baseline: unable Goal status: IN PROGRESS  4.  Patient will be independent with advanced HEP in order to promote long term functional mobility.  Baseline:  Goal status: IN PROGRESS     PLAN:  PT FREQUENCY: 2x/week  PT DURATION: 6 weeks  PLANNED INTERVENTIONS: Therapeutic exercises, Therapeutic activity, Neuromuscular re-education, Balance training, Gait training, Patient/Family education, Joint manipulation, Joint mobilization, Stair training, Orthotic/Fit training, DME instructions, Aquatic Therapy, Dry Needling, Electrical stimulation, Spinal manipulation, Spinal mobilization, Cryotherapy, Moist heat, Compression bandaging, scar mobilization, Splintting, Taping, Traction, Ultrasound, Ionotophoresis 4mg /ml Dexamethasone, and Manual therapy  PLAN FOR NEXT SESSION: LE strengthening, standing, balance training, progress to ambulating if able. May benefit from mat exercise   4:03 PM, 11/09/22 Nelida Meuse PT, DPT Physical Therapist with [facility] [phone] office

## 2022-11-11 ENCOUNTER — Encounter (HOSPITAL_COMMUNITY): Payer: 59 | Admitting: Physical Therapy

## 2022-11-15 ENCOUNTER — Encounter (HOSPITAL_COMMUNITY): Payer: 59

## 2022-11-16 ENCOUNTER — Encounter (HOSPITAL_COMMUNITY): Payer: 59

## 2022-11-17 ENCOUNTER — Telehealth (HOSPITAL_COMMUNITY): Payer: Self-pay | Admitting: Physical Therapy

## 2022-11-17 ENCOUNTER — Encounter (HOSPITAL_COMMUNITY): Payer: 59 | Admitting: Physical Therapy

## 2022-11-17 NOTE — Telephone Encounter (Signed)
Pt did not show for appt. Left VM reminding of NS policy and next appt.  Lurena Nida, PTA/CLT [facility] Rehabilitation [facility] Ph: [phone]

## 2022-11-18 ENCOUNTER — Encounter (HOSPITAL_COMMUNITY): Payer: 59

## 2022-11-22 ENCOUNTER — Encounter: Payer: Self-pay | Admitting: Internal Medicine

## 2022-11-22 ENCOUNTER — Ambulatory Visit (INDEPENDENT_AMBULATORY_CARE_PROVIDER_SITE_OTHER): Payer: Medicaid Other | Admitting: Internal Medicine

## 2022-11-22 ENCOUNTER — Other Ambulatory Visit: Payer: Self-pay | Admitting: Internal Medicine

## 2022-11-22 VITALS — BP 144/86 | HR 86 | Resp 14 | Ht 66.0 in | Wt 283.0 lb

## 2022-11-22 DIAGNOSIS — M059 Rheumatoid arthritis with rheumatoid factor, unspecified: Secondary | ICD-10-CM

## 2022-11-22 DIAGNOSIS — M25561 Pain in right knee: Secondary | ICD-10-CM

## 2022-11-22 DIAGNOSIS — Z79899 Other long term (current) drug therapy: Secondary | ICD-10-CM | POA: Diagnosis not present

## 2022-11-22 DIAGNOSIS — G8929 Other chronic pain: Secondary | ICD-10-CM | POA: Diagnosis not present

## 2022-11-22 MED ORDER — HUMIRA (2 PEN) 40 MG/0.4ML ~~LOC~~ AJKT
40.0000 mg | AUTO-INJECTOR | SUBCUTANEOUS | 2 refills | Status: DC
Start: 2022-11-22 — End: 2023-02-22

## 2022-11-22 MED ORDER — FOLIC ACID 1 MG PO TABS: 1.0000 mg | ORAL_TABLET | Freq: Every day | ORAL | 3 refills | Status: DC

## 2022-11-22 MED ORDER — METHOTREXATE SODIUM 2.5 MG PO TABS: 15.0000 mg | ORAL_TABLET | ORAL | 0 refills | Status: DC

## 2022-11-23 ENCOUNTER — Ambulatory Visit (HOSPITAL_COMMUNITY): Payer: Medicaid Other | Admitting: Physical Therapy

## 2022-11-23 DIAGNOSIS — M6281 Muscle weakness (generalized): Secondary | ICD-10-CM

## 2022-11-23 DIAGNOSIS — R2689 Other abnormalities of gait and mobility: Secondary | ICD-10-CM

## 2022-11-23 NOTE — Therapy (Signed)
OUTPATIENT PHYSICAL THERAPY TREATMENT Progress Note and Discharge Reporting Period 10/12/22 to 11/23/22  See note below for Objective Data and Assessment of Progress/Goals.   PHYSICAL THERAPY DISCHARGE SUMMARY  Visits from Start of Care: 4  Current functional level related to goals / functional outcomes: Patient has not met any goals.   Remaining deficits: Patient without improvement since evaluation likely due to noncompliance and attendance.   Education / Equipment: HEP   Patient agrees to discharge. Patient goals were not met. Patient is being discharged due to lack of progress. 11:00 AM, 11/24/22 Wyman Songster PT, DPT Physical Therapist at [facility]      Patient Name: [name] MRN: [id number] DOB:[birth year], 57 y.o., female Today's Date: 11/23/2022  END OF SESSION:  PT End of Session - 11/23/22 1601     Visit Number 4    Number of Visits 12    Date for PT Re-Evaluation 11/23/22    Authorization Type Medicaid healthy blue    Progress Note Due on Visit 10    PT Start Time 1604    PT Stop Time 1648    PT Time Calculation (min) 44 min    Equipment Utilized During Treatment Gait belt    Activity Tolerance Patient tolerated treatment well    Behavior During Therapy WFL for tasks assessed/performed               Past Medical History:  Diagnosis Date   High cholesterol    Hypertension    Hypothyroidism    Stroke (HCC)    weakness on left side.   Vaginal Pap smear, abnormal    Past Surgical History:  Procedure Laterality Date   DILATION AND CURETTAGE OF UTERUS     LASER ABLATION CONDOLAMATA Bilateral 09/27/2017   Procedure: LASER ABLATION CONDYLOMA AND REMOVAL OF PERIANAL WART;  Surgeon: [name], MD;  Location: [facility];  Service: Gynecology;  Laterality: Bilateral;   MASS EXCISION Left 09/27/2017   Procedure: EXCISION LESION LEFT THIGH;  Surgeon: [name], MD;  Location: [facility];  Service: Gynecology;  Laterality:  Left;   Patient Active Problem List   Diagnosis Date Noted   Acute respiratory failure with hypoxia (HCC) 01/15/2022   Hypokalemia 01/15/2022   Hyponatremia 01/15/2022   CAP (community acquired pneumonia) 01/14/2022   Sepsis (HCC) 01/14/2022   Pain in right knee 12/14/2021   Mild intermittent asthma without complication 07/07/2021   Normocytic anemia 12/02/2020   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/19/2020   High risk medication use 06/19/2020   Vitamin D deficiency 06/19/2020   Arthritis involving multiple sites 02/18/2018   Hyperlipidemia LDL goal <70 02/16/2018   Class 3 severe obesity due to excess calories with serious comorbidity and body mass index (BMI) of 45.0 to 49.9 in adult (HCC) 02/16/2018   Hemiparesis and alteration of sensations as late effects of stroke (HCC) 02/16/2018   Anogenital (venereal) warts 08/04/2017   Screening for colorectal cancer 08/04/2017   Encounter for gynecological examination with Papanicolaou smear of cervix 08/04/2017   Vertigo 08/04/2017   Vertigo as late effect of stroke 08/04/2017   Smoker 06/01/2017   Acquired hypothyroidism 05/12/2017   Acute CVA (cerebrovascular accident) (HCC) 06/25/2013   Tremor of left hand 06/25/2013   Hemiparesis, left (HCC) 06/25/2013   History of CVA with residual deficit 06/25/2013   Difficulty in walking(719.7) 08/31/2011   Unstable balance 08/31/2011   Weakness of left side of  body 08/31/2011   Difficulty walking 08/31/2011   Cerebral infarction (HCC) 04/07/2011   Left hand weakness 04/07/2011   Essential hypertension 04/07/2011    PCP: Tracey Harries MD  REFERRING PROVIDER: Porfirio Oar, PA  REFERRING DIAG: [phone] (ICD-10-CM) - Hemiplegia and hemiparesis following cerebral infarction affecting unspecified side R29.898 (ICD-10-CM) - Other symptoms and signs involving the musculoskeletal system M12.9 (ICD-10-CM) - Arthropathy,  unspecified  THERAPY DIAG:  Other abnormalities of gait and mobility  Muscle weakness (generalized)  Rationale for Evaluation and Treatment: Rehabilitation  ONSET DATE: 4 years  SUBJECTIVE:   SUBJECTIVE STATEMENT: Pt returns today in wheelchair with spouse.  Minimal ambulation and not completing an exercise program currently.   Eval:Patient states she wants to try to walk again. It has been about 4 years since she walked, she was using a cane until that point. Arthritis and pain and hx CVA limited ability to walk. She uses a scooter at home to get around. She transfers independently from bed to scooter. First CVA 2013 and had second but unsure when.   PERTINENT HISTORY: Hx CVA, Seropositive RA, HTN, HLD PAIN:  Are you having pain? No  PRECAUTIONS: Fall  WEIGHT BEARING RESTRICTIONS: No  FALLS:  Has patient fallen in last 6 months? No  OCCUPATION: not employed  PLOF: Independent with household mobility with device, Independent with transfers, Requires assistive device for independence, and Needs assistance with ADLs  PATIENT GOALS: to be able to walk   OBJECTIVE:     COGNITION: Overall cognitive status: Within functional limits for tasks assessed     SENSATION:WFL   POSTURE: rounded shoulders and forward head    LOWER EXTREMITY ROM:  Active ROM Right eval Left eval  Hip flexion    Hip extension    Hip abduction    Hip adduction    Hip internal rotation    Hip external rotation    Knee flexion    Knee extension    Ankle dorsiflexion    Ankle plantarflexion    Ankle inversion    Ankle eversion     (Blank rows = not tested)  LOWER EXTREMITY MMT:  MMT Right eval Left eval Left 11/23/22  Hip flexion 5 2- 2-  Hip extension     Hip abduction     Hip adduction     Hip internal rotation     Hip external rotation     Knee flexion 5 3- 1  Knee extension 5 4+ 3-  Ankle dorsiflexion 5 0 0  Ankle plantarflexion   0  Ankle inversion   0  Ankle  eversion   0   (Blank rows = not tested)    FUNCTIONAL TESTS:  At evaluation:   Transfer STS 2 x : min assist to transfer to standing from [facility] with hemiwalker, able to stand for 1-2 minutes before fatigue  requiring return to sitting (6/10 fatigue)  11/23/22:  transfer min assist weight shifted 75% to Rt standing with HW X 3 min max before having to sit down.  Gait with HW: unable   GAIT: Distance walked: unable Assistive device utilized: Hemi walker Level of assistance:  n/a Comments: unable   TODAY'S TREATMENT:  DATE:  11/23/22 MMT Transfers/standing ability Attempted ambulation with HW, unable Goal review HEP:  LAQ, hip flexion (minimal movement) and sit to stands with weight shift to Lt  Standing tolerance  11/09/2022  -Sit/stands from WC; RUE support CGA <> min assist, multimodal cues provided for symmetrical weight shift.  -Standing weight shifts in parallel bars with tactile cues at pelvis for Left lateral weight cues; provided facilitation into proper shift.  -Attempting single leg weight shift onto LLE 3 x 20 with heavy tactile faciliation onto L side with L knee blocking. 3 reps of last set able to hold for 3 seconds. CGA<>Min assist @ pelvis when shifting over to L side.  -Standing static holds with 2 and 4in box under RLE 2 x 30 seconds with 2in box; 1x 30 second on 4in box; min assist required with constant cues for motivation. LLE fatiguing limiting tolerance.   10/24/22 Review of goals Seated in WC: Left hip flexion x 5 LAQ's 2 x 5 Ankle dorsiflexion AAROM 2 x 5 Ankle dorsiflexion with strap x 5  Sit to stand min A to // bars (right hand holding) Static standing 2 x 1 min Standing heel raises on right (left unable) Standing weight shifts x 10 Standing left leg flexion x 5   10/12/22 EVAL    PATIENT EDUCATION:  Education details:  Patient educated on exam findings, POC, scope of PT, HEP, and possible need for AFO. Person educated: Patient Education method: Explanation, Demonstration, and Handouts Education comprehension: verbalized understanding, returned demonstration, verbal cues required, and tactile cues required  HOME EXERCISE PROGRAM: Begin next session  ASSESSMENT:  CLINICAL IMPRESSION: Pt  returns today following 2 weeks of absence and due for reassessment.  Within this 6 week time period, pt was scheduled for 15 sessions of which 4 were completed and remainder were cancelled or failed to show.  Discussed attendance with pt/spouse and reported conflicting appts and general pain that prevented participation with therapy.  According to spouse, Pt is walking short distances at home but is totally dependent on spouse to hold to (not using AD) and pull her up for transfers.  Pt is overall fearful of attempting these skill without his assistance due to falling.  Pt requested his help to mobilize her throughout session today.  Pt has not made any functional gains since initial evaluation, actually declining in strength of Lt LE and overall function.  Pt reports she only feels comfortable practicing ambulation at home.  Discussed with pt/spouse and agreeable to working on ambulation/exercises at home and when she can walk 20 feet with CGA from spouse and using AD she could return to therapy to continue progression.  Pt's general lack in confidence of LLE and overall abilities will need to be conquered before she can make progress with therapy. Pt given HEP today to work on as well as transfers and ambulation.   Eval:Patient a 57 y.o. y.o. female who was seen today for physical therapy evaluation and treatment for hx CVA, gait and mobility deficits. Patient presents with pain limited deficits in LE strength, ROM, endurance, activity tolerance, gait, balance, and functional mobility with ADL. Patient is having to modify and  restrict ADL as indicated by outcome measure score as well as subjective information and objective measures which is affecting overall participation. Patient will benefit from skilled physical therapy in order to improve function and reduce impairment.  OBJECTIVE IMPAIRMENTS: Abnormal gait, decreased activity tolerance, decreased balance, decreased endurance, decreased mobility, difficulty walking, decreased strength, impaired  flexibility, improper body mechanics, and obesity.   ACTIVITY LIMITATIONS: carrying, lifting, bending, standing, squatting, stairs, transfers, bed mobility, hygiene/grooming, locomotion level, and caring for others  PARTICIPATION LIMITATIONS: meal prep, cleaning, laundry, shopping, community activity, and yard work  PERSONAL FACTORS: Fitness, Time since onset of injury/illness/exacerbation, and 3+ comorbidities: Hx CVA, Seropositive RA, HTN, HLD  are also affecting patient's functional outcome.   REHAB POTENTIAL: Fair    CLINICAL DECISION MAKING: Evolving/moderate complexity  EVALUATION COMPLEXITY: Moderate   GOALS: Goals reviewed with patient? Yes  SHORT TERM GOALS: Target date: 11/02/2022    Patient will be independent with HEP in order to improve functional outcomes. Baseline: Goal status: NOT MET; pt was not given official HEP  2.  Patient will report at least 25% improvement in symptoms for improved quality of life. Baseline: Goal status: NOT MET    LONG TERM GOALS: Target date: 11/23/2022    Patient will report at least 75% improvement in symptoms for improved quality of life. Baseline:  Goal status: NOT MET  2.  Patient will be able to transfer from sit to stand Mod I with LRAD for improved ability to stand for pressure relief.  Baseline: min A Goal status: NOT MET  3.  Patient will be able to ambulate at least 25 feet in order to be able to ambulate to the bathroom in her home.  Baseline: unable Goal status: NOT MET  4.  Patient will be  independent with advanced HEP in order to promote long term functional mobility.  Baseline:  Goal status: NOT MET     PLAN:  PT FREQUENCY: 2x/week  PT DURATION: 6 weeks  PLANNED INTERVENTIONS: Therapeutic exercises, Therapeutic activity, Neuromuscular re-education, Balance training, Gait training, Patient/Family education, Joint manipulation, Joint mobilization, Stair training, Orthotic/Fit training, DME instructions, Aquatic Therapy, Dry Needling, Electrical stimulation, Spinal manipulation, Spinal mobilization, Cryotherapy, Moist heat, Compression bandaging, scar mobilization, Splintting, Taping, Traction, Ultrasound, Ionotophoresis 4mg /ml Dexamethasone, and Manual therapy  PLAN FOR NEXT SESSION: discharge as no progress made; noncompliance.  5:23 PM, 11/23/22 Lurena Nida, PTA/CLT [facility] Outpatient Rehabilitation [facility] Ph: [phone]

## 2022-11-23 NOTE — Progress Notes (Signed)
Sedimentation rate is partially improved at 84 down from 104 3 months ago although this is still well above normal.  Blood count looks good hemoglobin is 11.2 this is slightly better compared to 11 3 months ago and 10.36 months ago.  Metabolic panel test shows AST is 36 this is 1 point above normal.  I think she is fine to continue the current methotrexate and Humira dose.

## 2022-11-24 LAB — CBC WITH DIFFERENTIAL/PLATELET
Absolute Monocytes: 209 {cells}/uL (ref 200–950)
Basophils Absolute: 8 cells/uL (ref 0–200)
Basophils Relative: 0.2 %
Eosinophils Absolute: 78 {cells}/uL (ref 15–500)
Eosinophils Relative: 1.9 %
HCT: 33.6 % — ABNORMAL LOW (ref 35.0–45.0)
Hemoglobin: 11.2 g/dL — ABNORMAL LOW (ref 11.7–15.5)
Lymphs Abs: 1447 {cells}/uL (ref 850–3900)
MCH: 28.6 pg (ref 27.0–33.0)
MCHC: 33.3 g/dL (ref 32.0–36.0)
MCV: 85.9 fL (ref 80.0–100.0)
MPV: 8.5 fL (ref 7.5–12.5)
Monocytes Relative: 5.1 %
Neutro Abs: 2358 {cells}/uL (ref 1500–7800)
Neutrophils Relative %: 57.5 %
Platelets: 297 10*3/uL (ref 140–400)
RBC: 3.91 10*6/uL (ref 3.80–5.10)
RDW: 14.4 % (ref 11.0–15.0)
Total Lymphocyte: 35.3 %
WBC: 4.1 10*3/uL (ref 3.8–10.8)

## 2022-11-24 LAB — COMPLETE METABOLIC PANEL WITH GFR
AG Ratio: 0.6 (calc) — ABNORMAL LOW (ref 1.0–2.5)
ALT: 10 U/L (ref 6–29)
AST: 36 U/L — ABNORMAL HIGH (ref 10–35)
Albumin: 3.3 g/dL — ABNORMAL LOW (ref 3.6–5.1)
Alkaline phosphatase (APISO): 73 U/L (ref 37–153)
BUN: 13 mg/dL (ref 7–25)
CO2: 25 mmol/L (ref 20–32)
Calcium: 8.8 mg/dL (ref 8.6–10.4)
Chloride: 107 mmol/L (ref 98–110)
Creat: 0.59 mg/dL (ref 0.50–1.03)
Globulin: 5.1 g/dL — ABNORMAL HIGH (ref 1.9–3.7)
Glucose, Bld: 87 mg/dL (ref 65–99)
Potassium: 3.9 mmol/L (ref 3.5–5.3)
Sodium: 137 mmol/L (ref 135–146)
Total Bilirubin: 0.2 mg/dL (ref 0.2–1.2)
Total Protein: 8.4 g/dL — ABNORMAL HIGH (ref 6.1–8.1)
eGFR: 106 mL/min/{1.73_m2} (ref >=60)

## 2022-11-24 LAB — SEDIMENTATION RATE: Sed Rate: 84 mm/h — ABNORMAL HIGH (ref 0–30)

## 2022-11-24 LAB — QUANTIFERON-TB GOLD PLUS
Mitogen-NIL: 7.69 [IU]/mL
NIL: 0.01 [IU]/mL
QuantiFERON-TB Gold Plus: NEGATIVE
TB1-NIL: 0 [IU]/mL
TB2-NIL: 0 [IU]/mL

## 2022-11-25 ENCOUNTER — Telehealth: Payer: Self-pay | Admitting: Pharmacist

## 2022-11-25 NOTE — Telephone Encounter (Signed)
Received CMM key for PA renewal for Humira. However OV note from 11/22/2022 needs to be signed for updated clinicals to be attached. However CMM key is not further opening. Will likely have to start new CMM key altogether once OV note is signed  Key: [id number]  Chesley Mires, PharmD, MPH, BCPS, CPP Clinical Pharmacist (Rheumatology and Pulmonology)

## 2022-11-28 ENCOUNTER — Encounter (HOSPITAL_COMMUNITY): Payer: 59 | Admitting: Physical Therapy

## 2022-11-30 ENCOUNTER — Encounter (HOSPITAL_COMMUNITY): Payer: 59

## 2022-12-02 NOTE — Telephone Encounter (Signed)
Submitted a Prior Authorization request to [facility] for HUMIRA via CoverMyMeds. Will update once we receive a response.  Key: [id number]  Chesley Mires, PharmD, MPH, BCPS, CPP Clinical Pharmacist (Rheumatology and Pulmonology)

## 2022-12-02 NOTE — Telephone Encounter (Signed)
Received notification from [facility] regarding a prior authorization for ENBREL. Authorization has been APPROVED from 12/02/2022 to 12/02/2023. Approval letter sent to scan center.  Patient can continue to fill through  [facility]  Authorization # [id number]  Chesley Mires, PharmD, MPH, BCPS, CPP Clinical Pharmacist (Rheumatology and Pulmonology)

## 2023-01-20 ENCOUNTER — Ambulatory Visit (HOSPITAL_COMMUNITY)
Admission: RE | Admit: 2023-01-20 | Discharge: 2023-01-20 | Disposition: A | Discharge status: Home / Self Care | Payer: Medicaid Other | Source: Ambulatory Visit | Attending: Physician Assistant | Admitting: Physician Assistant

## 2023-01-20 DIAGNOSIS — Z1231 Encounter for screening mammogram for malignant neoplasm of breast: Secondary | ICD-10-CM | POA: Diagnosis present

## 2023-01-26 ENCOUNTER — Ambulatory Visit: Payer: Medicaid Other | Admitting: Dermatology

## 2023-02-08 NOTE — Progress Notes (Signed)
Office Visit Note  Patient: [name]             Date of Birth: 01-09-1966           MRN: [id number]             PCP: Tracey Harries, MD Referring: Tracey Harries, MD Visit Date: 02/22/2023   Subjective:  Follow-up (Patient states she has not taken methotrexate for the past couple of weeks because she is out of it. )   History of Present Illness: [name] is a 57 y.o. female here for follow up for seropositive RA on Humira 40 mg subcu q. 14 days and methotrexate 15 mg p.o. weekly and folic acid 1 mg daily.  But she is off the methotrexate for the past 2 weeks due to running out of medications.  She is noticing increased joint pain and swelling in multiple areas worst affected at the right knee.  Less so with right hand and right shoulder pain increased.  She has not had any significant infections since last visit.  She has not made any progress increasing mobility around the house still uses her wheelchair pretty much continuously.  Previous HPI 11/22/2022 [name] is a 57 y.o. female here for follow up for seropositive RA on Humira 40 mg subcu q. 14 days and methotrexate 15 mg p.o. weekly and folic acid 1 mg daily.  She has been doing some work with physical therapy now mobility is slightly improved doing better at getting herself back and forth to the bathroom and moving around at home.  Does feel like her leg pain is worse with increased use.  Is having some increased shoulder pain at night as well.   08/22/2022 [name] is a 57 y.o. female here for follow up for seropositive RA on Humira 40 mg subcu q. 14 days and methotrexate 15 mg p.o. weekly and folic acid 1 mg daily.  Overall she feels symptoms are doing okay without major exacerbation.  She still has very limited mobility and did not start any formal work with physical therapy yet about her leg weakness and limited walking.  With weakness on left side gets some more joint pain and swelling in her right hand and right knee  has not had any falls.   05/24/22 [name] is a 57 y.o. female here for follow up for seropositive RA on Humira 40 mg subcu q. 14 days and methotrexate 15 mg p.o. weekly folic acid 1 mg daily.  Since her last visit she had 1 hospitalization for severe illness with sepsis requiring IV antibiotic treatment but she recovered entirely after this.  She has been doing reasonably well without severe RA flareups off of any maintenance steroids for at least 3 months.  Started on Humira in November taking her injections subcutaneous on the abdomen with no problems.  No serious infections since starting this medication.  She ran out of the methotrexate within the past few weeks due to missing some scheduled clinic follow-up.  She remains mostly immobile without much progress walking or doing any regular exercises towards this goal.   12/14/21 [name] is a 57 y.o. female here for follow up for seropositive RA on MTX 15 mg PO weekly and folic acid 1 mg daily. She continues to have swelling in her hands but not much pain unless tightly gripping or under pressure. Her right knee pain limits weightbearing severely and she has not been able to travel even 5-10 ft distances to  the bathroom consistently. She is not sure whether this knee swells usually. 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   Previous HPI [name] is a 57 y.o. female here for follow up for seropositive RA on MTX 20 mg PO weekly and folic acid 1 mg daily. She took a course of oral prednisone after last visit in November but joint symptoms have been reasonably controlled on just methotrexate since then. We had discussed possible addition of Humira. She remains limited in mobility mostly due to left leg weakness and stiffness, using a cane at home.   Previous HPI 02/10/21 [name] is a 57 y.o. female here for telehealth visit by video for follow up of her seropositive rheumatoid arthritis on  methotrexate 20 mg PO weekly. She is experiencing increased joint pain and swelling for the past 2 to 3 weeks unable to tightly close her right hand and cannot bear weight enough to assist in transferring out of bed and wheelchair. She reports feeling better each week after taking methotrexate but quickly worsens again within a few days and this has been worsening. Otherwise no new side effect or health changes. We reviewed her previous clinic visit still had some ongoing swelling and very high sedimentation rate tests but the pain and mobility is a lot worse.   Previous HPI 06/19/20 [name] is a 57 y.o. female with history of hypothyroidism, CVA with residual left hemiparesis, HTN, HLD here for evaluation of arthritis of multiple joints and positive rheumatoid factor. She has symptoms for some amount of time at least 6 months, swelling, and stiffness of joints on the right half of her body including elbow, wrist, fingers, and knee. She was evaluated in PCP clinic with highly positive inflammatory markers and RA antibodies with swollen joints noted on exam in 11/2019. She also had positive ANA and RNP antibodies. Xray of the right hand was checked and reportedly not show other cause of joint pain and no erosion. She has used some topical diclofenac without much improvement and tried tramadol also without much improvement. She has become unable to walk much due to severe pain bearing weight on her right knee and the left leg is too weak. Today she says symptoms are doing better compared to 2 months ago severity. She denies any complaints of hair loss, rashes, lymphadenopathy, oral ulcers, raynaud's.    Labs reviewed 11/2019 ESR 114 CRP 36 RF 421.6 CCP >250 ANA pos RNP >8.0 DsDNA, Sm, SSA, SSB neg Vit D 12.7   Review of Systems  Constitutional:  Negative for fatigue.  HENT:  Negative for mouth sores and mouth dryness.   Eyes:  Negative for dryness.  Respiratory:  Negative for shortness of  breath.   Cardiovascular:  Negative for chest pain and palpitations.  Gastrointestinal:  Negative for blood in stool, constipation and diarrhea.  Endocrine: Negative for increased urination.  Genitourinary:  Negative for involuntary urination.  Musculoskeletal:  Positive for joint pain, gait problem, joint pain, joint swelling, myalgias, morning stiffness and myalgias. Negative for muscle weakness and muscle tenderness.  Skin:  Negative for color change, rash, hair loss and sensitivity to sunlight.  Allergic/Immunologic: Negative for susceptible to infections.  Neurological:  Negative for dizziness and headaches.  Hematological:  Negative for swollen glands.  Psychiatric/Behavioral:  Negative for depressed mood and sleep disturbance. The patient is not nervous/anxious.     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/19/2020   High risk medication use 06/19/2020   Vitamin D deficiency 06/19/2020   Arthritis involving multiple sites 02/18/2018   Hyperlipidemia LDL goal <70 02/16/2018   Class 3 severe obesity due to excess calories with serious comorbidity and body mass index (BMI) of 45.0 to 49.9 in adult (HCC) 02/16/2018   Hemiparesis and alteration of sensations as late effects of stroke (HCC) 02/16/2018   Anogenital (venereal) warts 08/04/2017   Screening for colorectal cancer 08/04/2017   Encounter for gynecological examination with Papanicolaou smear of cervix 08/04/2017   Vertigo 08/04/2017   Vertigo as late effect of stroke 08/04/2017   Smoker 06/01/2017    Acquired hypothyroidism 05/12/2017   Acute CVA (cerebrovascular accident) (HCC) 06/25/2013   Tremor of left hand 06/25/2013   Hemiparesis, left (HCC) 06/25/2013   History of CVA with residual deficit 06/25/2013   Difficulty walking 08/31/2011   Unstable balance 08/31/2011   Weakness of left side of body 08/31/2011   Difficulty walking 08/31/2011   Cerebral infarction (HCC) 04/07/2011   Left hand weakness 04/07/2011   Essential hypertension 04/07/2011    Past Medical History:  Diagnosis Date   High cholesterol    Hypertension    Hypothyroidism    Stroke (HCC)    weakness on left side.   Vaginal Pap smear, abnormal     Family History  Problem Relation Age of Onset   Hypertension Mother    Liver disease Mother    Heart attack Father    HIV Sister    Arrhythmia Sister    Mood Disorder Sister    Arrhythmia Brother    CAD Brother    Past Surgical History:  Procedure Laterality Date   DILATION AND CURETTAGE OF UTERUS     LASER ABLATION CONDOLAMATA Bilateral 09/27/2017   Procedure: LASER ABLATION CONDYLOMA AND REMOVAL OF PERIANAL WART;  Surgeon: [name], MD;  Location: [facility];  Service: Gynecology;  Laterality: Bilateral;   MASS EXCISION Left 09/27/2017   Procedure: EXCISION LESION LEFT THIGH;  Surgeon: [name], MD;  Location: [facility];  Service: Gynecology;  Laterality: Left;   Social History   Social History Narrative   Grew up in [place], [place].   Engaged to be married.   5 children.    Eats mainly meat/junk food.   Immunization History  Administered Date(s) Administered   PFIZER Comirnaty(Gray Top)Covid-19 Tri-Sucrose Vaccine 08/26/2020   PFIZER(Purple Top)SARS-COV-2 Vaccination 12/12/2019, 01/02/2020   Pfizer(Comirnaty)Fall Seasonal Vaccine 12 years and older 01/25/2022, 01/04/2023     Objective: Vital Signs: BP 137/83 (BP Location: Right Arm, Patient Position: Sitting, Cuff Size: Normal)   Pulse (!) 103   Resp 12   Ht 5\' 6"  (1.676 m)   Wt 240 lb  (108.9 kg) Comment: patient in a wheelchair  LMP 07/29/2012   BMI 38.74 kg/m    Physical Exam Constitutional:      Appearance: She is obese.     Comments: In wheelchair  Eyes:     Conjunctiva/sclera: Conjunctivae normal.  Cardiovascular:     Rate and Rhythm: Normal rate and regular rhythm.  Pulmonary:     Effort: Pulmonary effort  is normal.     Breath sounds: Normal breath sounds.  Musculoskeletal:     Right lower leg: No edema.  Lymphadenopathy:     Cervical: No cervical adenopathy.  Skin:    General: Skin is warm and dry.     Findings: No rash.  Neurological:     Mental Status: She is alert.  Psychiatric:        Mood and Affect: Mood normal.      Musculoskeletal Exam:  Right elbow restricted flexion range of motion, tender to pressure and with movement near limits Right hand with swelling over multiple MCP and PIP joints mild tenderness to pressure, grip range of motion is partially reduced Right knee full passive range of motion intact, patellofemoral crepitus, joint line tenderness to pressure but worst is posterior tenderness especially with full extension, no palpable effusion Left-sided hemiparesis, shoulder elbow and wrist and contracted position as fair finger joint mobility but mostly passively with negligible strength Left knee is restricted in passive flexion and extension   Investigation: No additional findings.  Imaging: No results found.  Recent Labs: Lab Results  Component Value Date   WBC 4.1 11/22/2022   HGB 11.2 (L) 11/22/2022   PLT 297 11/22/2022   NA 137 11/22/2022   K 3.9 11/22/2022   CL 107 11/22/2022   CO2 25 11/22/2022   GLUCOSE 87 11/22/2022   BUN 13 11/22/2022   CREATININE 0.59 11/22/2022   BILITOT 0.2 11/22/2022   ALKPHOS 52 01/15/2022   AST 36 (H) 11/22/2022   ALT 10 11/22/2022   PROT 8.4 (H) 11/22/2022   ALBUMIN 2.2 (L) 01/15/2022   CALCIUM 8.8 11/22/2022   GFRAA 115 07/29/2020   QFTBGOLDPLUS NEGATIVE 11/22/2022     Speciality Comments: No specialty comments available.  Procedures:  No procedures performed Allergies: Lisinopril   Assessment / Plan:     Visit Diagnoses: Seropositive rheumatoid arthritis (HCC) - Plan: adalimumab (HUMIRA, 2 PEN,) 40 MG/0.4ML pen, methotrexate (RHEUMATREX) 2.5 MG tablet, folic acid (FOLVITE) 1 MG tablet, predniSONE (DELTASONE) 5 MG tablet, Sedimentation rate  Symptoms appear definitely in a flare with some increased pain and swelling compared to previous most likely from missing several doses of medication.  Checking sed rate for disease activity monitoring.  Plan to continue Humira 40 mg subcu q. 14 days and resume methotrexate 15 mg p.o. weekly folic acid 1 mg daily.  Not recommending any titration of methotrexate higher due to mildly abnormal LFTs.  Also send a 12-day prednisone taper for current flare.  High risk medication use - Humira 40 mg subcu q. 14 days methotrexate 15 mg p.o. weekly and folic acid 1 mg daily. - Plan: CBC with Differential/Platelet, COMPLETE METABOLIC PANEL WITH GFR  Checking CBC and CMP for medication monitoring on continued long-term use of Humira and methotrexate.  No serious interval infection.  Sending a short prednisone taper discussed risks with repeat steroid use.  Chronic pain of right knee  Chronic pain apparently worse than baseline but is no obvious synovitis present on exam changes more consistent with osteoarthritis.  Does severely limit her mobility combination of pain with weightbearing and the left-sided hemiparesis so cannot distribute weight.  Orders: Orders Placed This Encounter  Procedures   Sedimentation rate   CBC with Differential/Platelet   COMPLETE METABOLIC PANEL WITH GFR   Meds ordered this encounter  Medications   adalimumab (HUMIRA, 2 PEN,) 40 MG/0.4ML pen    Sig: Inject 0.4 mLs (40 mg total) into the skin every 14 (  fourteen) days.    Dispense:  2 each    Refill:  2   methotrexate (RHEUMATREX) 2.5 MG  tablet    Sig: Take 6 tablets (15 mg total) by mouth once a week. Caution:Chemotherapy. Protect from light.    Dispense:  78 tablet    Refill:  0   folic acid (FOLVITE) 1 MG tablet    Sig: Take 1 tablet (1 mg total) by mouth daily.    Dispense:  90 tablet    Refill:  3   predniSONE (DELTASONE) 5 MG tablet    Sig: Take 4 tablets (20 mg total) by mouth daily with breakfast for 3 days, THEN 3 tablets (15 mg total) daily with breakfast for 3 days, THEN 2 tablets (10 mg total) daily with breakfast for 3 days, THEN 1 tablet (5 mg total) daily with breakfast for 3 days.    Dispense:  30 tablet    Refill:  0     Follow-Up Instructions: Return in about 3 months (around 05/25/2023) for RA on ADA/MTX f/u 3mos.   Fuller Plan, MD  Note - This record has been created using [employer].  Chart creation errors have been sought, but may not always  have been located. Such creation errors do not reflect on  the standard of medical care.

## 2023-02-22 ENCOUNTER — Ambulatory Visit: Payer: Medicaid Other | Attending: Internal Medicine | Admitting: Internal Medicine

## 2023-02-22 ENCOUNTER — Encounter: Payer: Self-pay | Admitting: Internal Medicine

## 2023-02-22 VITALS — BP 137/83 | HR 103 | Resp 12 | Ht 66.0 in | Wt 240.0 lb

## 2023-02-22 DIAGNOSIS — G8929 Other chronic pain: Secondary | ICD-10-CM

## 2023-02-22 DIAGNOSIS — M25561 Pain in right knee: Secondary | ICD-10-CM | POA: Diagnosis not present

## 2023-02-22 DIAGNOSIS — Z79899 Other long term (current) drug therapy: Secondary | ICD-10-CM | POA: Diagnosis not present

## 2023-02-22 DIAGNOSIS — M059 Rheumatoid arthritis with rheumatoid factor, unspecified: Secondary | ICD-10-CM

## 2023-02-22 MED ORDER — METHOTREXATE SODIUM 2.5 MG PO TABS
15.0000 mg | ORAL_TABLET | ORAL | 0 refills | Status: DC
Start: 2023-02-22 — End: 2023-08-07

## 2023-02-22 MED ORDER — FOLIC ACID 1 MG PO TABS
1.0000 mg | ORAL_TABLET | Freq: Every day | ORAL | 3 refills | Status: DC
Start: 2023-02-22 — End: 2023-07-09

## 2023-02-22 MED ORDER — PREDNISONE 5 MG PO TABS
ORAL_TABLET | ORAL | 0 refills | Status: AC
Start: 2023-02-22 — End: 2023-03-05

## 2023-02-22 MED ORDER — HUMIRA (2 PEN) 40 MG/0.4ML ~~LOC~~ AJKT
40.0000 mg | AUTO-INJECTOR | SUBCUTANEOUS | 2 refills | Status: DC
Start: 2023-02-22 — End: 2023-02-23
  Filled 2023-02-23: qty 2, 28d supply, fill #0

## 2023-02-23 ENCOUNTER — Other Ambulatory Visit (HOSPITAL_COMMUNITY): Payer: Self-pay

## 2023-02-23 ENCOUNTER — Other Ambulatory Visit: Payer: Self-pay | Admitting: Pharmacist

## 2023-02-23 DIAGNOSIS — M059 Rheumatoid arthritis with rheumatoid factor, unspecified: Secondary | ICD-10-CM

## 2023-02-23 LAB — COMPLETE METABOLIC PANEL WITH GFR
AG Ratio: 0.6 (calc) — ABNORMAL LOW (ref 1.0–2.5)
ALT: 12 U/L (ref 6–29)
AST: 67 U/L — ABNORMAL HIGH (ref 10–35)
Albumin: 3.1 g/dL — ABNORMAL LOW (ref 3.6–5.1)
Alkaline phosphatase (APISO): 64 U/L (ref 37–153)
BUN: 14 mg/dL (ref 7–25)
CO2: 28 mmol/L (ref 20–32)
Calcium: 9.3 mg/dL (ref 8.6–10.4)
Chloride: 102 mmol/L (ref 98–110)
Creat: 0.68 mg/dL (ref 0.50–1.03)
Globulin: 5.2 g/dL — ABNORMAL HIGH (ref 1.9–3.7)
Glucose, Bld: 81 mg/dL (ref 65–99)
Potassium: 4.1 mmol/L (ref 3.5–5.3)
Sodium: 137 mmol/L (ref 135–146)
Total Bilirubin: 0.3 mg/dL (ref 0.2–1.2)
Total Protein: 8.3 g/dL — ABNORMAL HIGH (ref 6.1–8.1)
eGFR: 102 mL/min/{1.73_m2} (ref >=60)

## 2023-02-23 LAB — CBC WITH DIFFERENTIAL/PLATELET
Absolute Lymphocytes: 1041 {cells}/uL (ref 850–3900)
Absolute Monocytes: 159 {cells}/uL — ABNORMAL LOW (ref 200–950)
Basophils Absolute: 22 {cells}/uL (ref 0–200)
Basophils Relative: 0.5 %
Eosinophils Absolute: 39 {cells}/uL (ref 15–500)
Eosinophils Relative: 0.9 %
HCT: 33.4 % — ABNORMAL LOW (ref 35.0–45.0)
Hemoglobin: 10.7 g/dL — ABNORMAL LOW (ref 11.7–15.5)
MCH: 27 pg (ref 27.0–33.0)
MCHC: 32 g/dL (ref 32.0–36.0)
MCV: 84.1 fL (ref 80.0–100.0)
MPV: 10.1 fL (ref 7.5–12.5)
Monocytes Relative: 3.7 %
Neutro Abs: 3040 {cells}/uL (ref 1500–7800)
Neutrophils Relative %: 70.7 %
Platelets: 383 10*3/uL (ref 140–400)
RBC: 3.97 10*6/uL (ref 3.80–5.10)
RDW: 14.7 % (ref 11.0–15.0)
Total Lymphocyte: 24.2 %
WBC: 4.3 10*3/uL (ref 3.8–10.8)

## 2023-02-23 LAB — SEDIMENTATION RATE: Sed Rate: 117 mm/h — ABNORMAL HIGH (ref 0–30)

## 2023-02-23 MED ORDER — HUMIRA (2 PEN) 40 MG/0.4ML ~~LOC~~ AJKT: 40.0000 mg | AUTO-INJECTOR | SUBCUTANEOUS | 2 refills | Status: DC

## 2023-02-28 ENCOUNTER — Telehealth: Payer: Self-pay | Admitting: Internal Medicine

## 2023-02-28 NOTE — Telephone Encounter (Signed)
Pt called requesting the lab results be read to her. [phone]

## 2023-05-11 ENCOUNTER — Other Ambulatory Visit: Payer: Self-pay | Admitting: Internal Medicine

## 2023-05-11 DIAGNOSIS — M059 Rheumatoid arthritis with rheumatoid factor, unspecified: Secondary | ICD-10-CM

## 2023-05-11 NOTE — Telephone Encounter (Signed)
 Last Fill: 02/23/2023  Labs: 02/22/2023 Hgb 10.7, Hct 33.4, Absolute Monocytes 159  TB Gold: 11/22/2022 Neg    Next Visit: 05/29/2023  Last Visit: 02/22/2023  IK:Dzmnendpupcz rheumatoid arthritis   Current Dose per office note 02/22/2023: Humira  40 mg subcu q. 14 days   Okay to refill Humira ?

## 2023-05-15 NOTE — Progress Notes (Deleted)
 Office Visit Note  Patient: [name]             Date of Birth: [birth year]           MRN: [id number]             PCP: Tracey Harries, MD Referring: Tracey Harries, MD Visit Date: 05/29/2023   Subjective:  No chief complaint on file.   History of Present Illness: [name] is a 58 y.o. female here for follow up for seropositive RA on Humira 40 mg subcu q. 14 days and methotrexate 15 mg p.o. weekly and folic acid 1 mg daily.    Previous HPI 02/22/2023 [name] is a 58 y.o. female here for follow up for seropositive RA on Humira 40 mg subcu q. 14 days and methotrexate 15 mg p.o. weekly and folic acid 1 mg daily.  But she is off the methotrexate for the past 2 weeks due to running out of medications.  She is noticing increased joint pain and swelling in multiple areas worst affected at the right knee.  Less so with right hand and right shoulder pain increased.  She has not had any significant infections since last visit.  She has not made any progress increasing mobility around the house still uses her wheelchair pretty much continuously.   Previous HPI 11/22/2022 [name] is a 58 y.o. female here for follow up for seropositive RA on Humira 40 mg subcu q. 14 days and methotrexate 15 mg p.o. weekly and folic acid 1 mg daily.  She has been doing some work with physical therapy now mobility is slightly improved doing better at getting herself back and forth to the bathroom and moving around at home.  Does feel like her leg pain is worse with increased use.  Is having some increased shoulder pain at night as well.   08/22/2022 [name] is a 58 y.o. female here for follow up for seropositive RA on Humira 40 mg subcu q. 14 days and methotrexate 15 mg p.o. weekly and folic acid 1 mg daily.  Overall she feels symptoms are doing okay without major exacerbation.  She still has very limited mobility and did not start any formal work with physical therapy yet about her leg weakness and  limited walking.  With weakness on left side gets some more joint pain and swelling in her right hand and right knee has not had any falls.   05/24/22 [name] is a 58 y.o. female here for follow up for seropositive RA on Humira 40 mg subcu q. 14 days and methotrexate 15 mg p.o. weekly folic acid 1 mg daily.  Since her last visit she had 1 hospitalization for severe illness with sepsis requiring IV antibiotic treatment but she recovered entirely after this.  She has been doing reasonably well without severe RA flareups off of any maintenance steroids for at least 3 months.  Started on Humira in November taking her injections subcutaneous on the abdomen with no problems.  No serious infections since starting this medication.  She ran out of the methotrexate within the past few weeks due to missing some scheduled clinic follow-up.  She remains mostly immobile without much progress walking or doing any regular exercises towards this goal.   12/14/21 [name] is a 58 y.o. female here for follow up for seropositive RA on MTX 15 mg PO weekly and folic acid 1 mg daily. She continues to have swelling in her hands but not much pain unless  tightly gripping or under pressure. Her right knee pain limits weightbearing severely and she has not been able to travel even 5-10 ft distances to the bathroom consistently. She is not sure whether this knee swells usually. 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   Previous HPI [name] is a 58 y.o. female here for follow up for seropositive RA on MTX 20 mg PO weekly and folic acid 1 mg daily. She took a course of oral prednisone after last visit in November but joint symptoms have been reasonably controlled on just methotrexate since then. We had discussed possible addition of Humira. She remains limited in mobility mostly due to left leg weakness and stiffness, using a cane at home.   Previous HPI 02/10/21 [name] is  a 58 y.o. female here for telehealth visit by video for follow up of her seropositive rheumatoid arthritis on methotrexate 20 mg PO weekly. She is experiencing increased joint pain and swelling for the past 2 to 3 weeks unable to tightly close her right hand and cannot bear weight enough to assist in transferring out of bed and wheelchair. She reports feeling better each week after taking methotrexate but quickly worsens again within a few days and this has been worsening. Otherwise no new side effect or health changes. We reviewed her previous clinic visit still had some ongoing swelling and very high sedimentation rate tests but the pain and mobility is a lot worse.   Previous HPI 06/19/20 [name] is a 58 y.o. female with history of hypothyroidism, CVA with residual left hemiparesis, HTN, HLD here for evaluation of arthritis of multiple joints and positive rheumatoid factor. She has symptoms for some amount of time at least 6 months, swelling, and stiffness of joints on the right half of her body including elbow, wrist, fingers, and knee. She was evaluated in PCP clinic with highly positive inflammatory markers and RA antibodies with swollen joints noted on exam in 11/2019. She also had positive ANA and RNP antibodies. Xray of the right hand was checked and reportedly not show other cause of joint pain and no erosion. She has used some topical diclofenac without much improvement and tried tramadol also without much improvement. She has become unable to walk much due to severe pain bearing weight on her right knee and the left leg is too weak. Today she says symptoms are doing better compared to 2 months ago severity. She denies any complaints of hair loss, rashes, lymphadenopathy, oral ulcers, raynaud's.    Labs reviewed 11/2019 ESR 114 CRP 36 RF 421.6 CCP >250 ANA pos RNP >8.0 DsDNA, Sm, SSA, SSB neg Vit D 12.7   No Rheumatology ROS completed.   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/19/2020   High risk medication use 06/19/2020   Vitamin D deficiency 06/19/2020   Arthritis involving multiple sites 02/18/2018   Hyperlipidemia LDL goal <70 02/16/2018   Class 3 severe obesity due to excess calories with serious comorbidity and body mass index (BMI) of 45.0 to 49.9 in adult [facility]) 02/16/2018  Hemiparesis and alteration of sensations as late effects of stroke (HCC) 02/16/2018   Anogenital (venereal) warts 08/04/2017   Screening for colorectal cancer 08/04/2017   Encounter for gynecological examination with Papanicolaou smear of cervix 08/04/2017   Vertigo 08/04/2017   Vertigo as late effect of stroke 08/04/2017   Smoker 06/01/2017   Acquired hypothyroidism 05/12/2017   Acute CVA (cerebrovascular accident) (HCC) 06/25/2013   Tremor of left hand 06/25/2013   Hemiparesis, left (HCC) 06/25/2013   History of CVA with residual deficit 06/25/2013   Difficulty walking 08/31/2011   Unstable balance 08/31/2011   Weakness of left side of body 08/31/2011   Difficulty walking 08/31/2011   Cerebral infarction (HCC) 04/07/2011   Left hand weakness 04/07/2011   Essential hypertension 04/07/2011    Past Medical History:  Diagnosis Date   High cholesterol    Hypertension    Hypothyroidism    Stroke (HCC)    weakness on left side.   Vaginal Pap smear, abnormal     Family History  Problem Relation Age of Onset   Hypertension Mother    Liver disease Mother    Heart attack Father    HIV Sister    Arrhythmia Sister    Mood Disorder Sister    Arrhythmia  Brother    CAD Brother    Past Surgical History:  Procedure Laterality Date   DILATION AND CURETTAGE OF UTERUS     LASER ABLATION CONDOLAMATA Bilateral 09/27/2017   Procedure: LASER ABLATION CONDYLOMA AND REMOVAL OF PERIANAL WART;  Surgeon: [name], MD;  Location: [facility];  Service: Gynecology;  Laterality: Bilateral;   MASS EXCISION Left 09/27/2017   Procedure: EXCISION LESION LEFT THIGH;  Surgeon: [name], MD;  Location: [facility];  Service: Gynecology;  Laterality: Left;   Social History   Social History Narrative   Grew up in [place], [place].   Engaged to be married.   5 children.    Eats mainly meat/junk food.   Immunization History  Administered Date(s) Administered   PFIZER Comirnaty(Gray Top)Covid-19 Tri-Sucrose Vaccine 08/26/2020   PFIZER(Purple Top)SARS-COV-2 Vaccination 12/12/2019, 01/02/2020   Pfizer(Comirnaty)Fall Seasonal Vaccine 12 years and older 01/25/2022, 01/04/2023     Objective: Vital Signs: LMP 07/29/2012    Physical Exam   Musculoskeletal Exam: ***  CDAI Exam: CDAI Score: -- Patient Global: --; Provider Global: -- Swollen: --; Tender: -- Joint Exam 05/29/2023   No joint exam has been documented for this visit   There is currently no information documented on the homunculus. Go to the Rheumatology activity and complete the homunculus joint exam.  Investigation: No additional findings.  Imaging: No results found.  Recent Labs: Lab Results  Component Value Date   WBC 4.3 02/22/2023   HGB 10.7 (L) 02/22/2023   PLT 383 02/22/2023   NA 137 02/22/2023   K 4.1 02/22/2023   CL 102 02/22/2023   CO2 28 02/22/2023   GLUCOSE 81 02/22/2023   BUN 14 02/22/2023   CREATININE 0.68 02/22/2023   BILITOT 0.3 02/22/2023   ALKPHOS 52 01/15/2022   AST 67 (H) 02/22/2023   ALT 12 02/22/2023   PROT 8.3 (H) 02/22/2023   ALBUMIN 2.2 (L) 01/15/2022   CALCIUM 9.3 02/22/2023   GFRAA 115 07/29/2020   QFTBGOLDPLUS NEGATIVE 11/22/2022    Speciality  Comments: No specialty comments available.  Procedures:  No procedures performed Allergies: Lisinopril   Assessment / Plan:     Visit Diagnoses: No diagnosis found.  ***  Orders: No orders  of the defined types were placed in this encounter.  No orders of the defined types were placed in this encounter.    Follow-Up Instructions: No follow-ups on file.   Metta Clines, RT  Note - This record has been created using [employer].  Chart creation errors have been sought, but may not always  have been located. Such creation errors do not reflect on  the standard of medical care.

## 2023-05-29 ENCOUNTER — Ambulatory Visit: Payer: Medicaid Other | Admitting: Internal Medicine

## 2023-05-29 DIAGNOSIS — M059 Rheumatoid arthritis with rheumatoid factor, unspecified: Secondary | ICD-10-CM

## 2023-05-29 DIAGNOSIS — G8929 Other chronic pain: Secondary | ICD-10-CM

## 2023-05-29 DIAGNOSIS — Z79899 Other long term (current) drug therapy: Secondary | ICD-10-CM

## 2023-06-14 ENCOUNTER — Other Ambulatory Visit: Payer: Self-pay | Admitting: Internal Medicine

## 2023-06-14 DIAGNOSIS — M059 Rheumatoid arthritis with rheumatoid factor, unspecified: Secondary | ICD-10-CM

## 2023-06-14 NOTE — Telephone Encounter (Signed)
 Last Fill: 05/11/2023  Labs: 02/22/2023 ESR elevated at 117 consistent with active inflammation. Blood count is stable, very mild anemia Hgb of 10.7. AST is slightly worse than before raised at 67. Other liver markers are normal so not sure if this is significant, if raised again in follow up would be concerned for methotrexate long term use. Spoke to patient's daughter, [name], who will have patient call to schedule appt and advise patient labs are due.  TB Gold: 11/22/2022  NEGATIVE   Next Visit: Spoke to patient's daughter, [name], who will have patient call to schedule appt and advise patient labs are due.  Last Visit: 02/22/2023  ZO:[id number] rheumatoid arthritis   Current Dose per office note 02/22/2023: Humira 40 mg subcu q. 14 days   Okay to refill Humira?

## 2023-07-05 ENCOUNTER — Telehealth: Payer: Self-pay | Admitting: Internal Medicine

## 2023-07-05 NOTE — Telephone Encounter (Signed)
 Patient's daughter [name] called and rescheduled the appointment that her mom cancelled in February.  She states her mom is having difficulty walking and hasn't left the house in a month.  Patient is scheduled for 09/06/23 which is the first available appointment and was added to the cancellation list.  [name] requested a return call to update Dr. Dimple Casey on her mom's deteriorating condition.

## 2023-07-06 NOTE — Telephone Encounter (Signed)
 Called patient's daughter [name] to let her know that Dr. Dimple Casey had a cancellation at 1:00 pm today.  Patient declined the appointment stating she needs an appointment after 3:00 pm.  Patient will remain on the cancellation list.

## 2023-07-09 ENCOUNTER — Emergency Department (HOSPITAL_COMMUNITY)

## 2023-07-09 ENCOUNTER — Other Ambulatory Visit: Payer: Self-pay

## 2023-07-09 ENCOUNTER — Inpatient Hospital Stay (HOSPITAL_COMMUNITY)
Admission: EM | Admit: 2023-07-09 | Discharge: 2023-07-16 | DRG: 286 | Disposition: A | Discharge status: Home / Self Care | Attending: Internal Medicine | Admitting: Internal Medicine

## 2023-07-09 DIAGNOSIS — I10 Essential (primary) hypertension: Secondary | ICD-10-CM | POA: Diagnosis not present

## 2023-07-09 DIAGNOSIS — R0602 Shortness of breath: Principal | ICD-10-CM

## 2023-07-09 DIAGNOSIS — I272 Pulmonary hypertension, unspecified: Secondary | ICD-10-CM | POA: Diagnosis present

## 2023-07-09 DIAGNOSIS — D649 Anemia, unspecified: Secondary | ICD-10-CM | POA: Diagnosis present

## 2023-07-09 DIAGNOSIS — Z6835 Body mass index (BMI) 35.0-35.9, adult: Secondary | ICD-10-CM

## 2023-07-09 DIAGNOSIS — E8809 Other disorders of plasma-protein metabolism, not elsewhere classified: Secondary | ICD-10-CM | POA: Insufficient documentation

## 2023-07-09 DIAGNOSIS — Z5982 Transportation insecurity: Secondary | ICD-10-CM

## 2023-07-09 DIAGNOSIS — I69354 Hemiplegia and hemiparesis following cerebral infarction affecting left non-dominant side: Secondary | ICD-10-CM | POA: Diagnosis not present

## 2023-07-09 DIAGNOSIS — R0601 Orthopnea: Secondary | ICD-10-CM

## 2023-07-09 DIAGNOSIS — G4700 Insomnia, unspecified: Secondary | ICD-10-CM | POA: Diagnosis present

## 2023-07-09 DIAGNOSIS — E782 Mixed hyperlipidemia: Secondary | ICD-10-CM | POA: Diagnosis present

## 2023-07-09 DIAGNOSIS — I959 Hypotension, unspecified: Secondary | ICD-10-CM | POA: Diagnosis not present

## 2023-07-09 DIAGNOSIS — T451X5A Adverse effect of antineoplastic and immunosuppressive drugs, initial encounter: Secondary | ICD-10-CM | POA: Diagnosis present

## 2023-07-09 DIAGNOSIS — I639 Cerebral infarction, unspecified: Secondary | ICD-10-CM | POA: Diagnosis present

## 2023-07-09 DIAGNOSIS — I50811 Acute right heart failure: Secondary | ICD-10-CM | POA: Diagnosis present

## 2023-07-09 DIAGNOSIS — D72819 Decreased white blood cell count, unspecified: Secondary | ICD-10-CM | POA: Insufficient documentation

## 2023-07-09 DIAGNOSIS — E440 Moderate protein-calorie malnutrition: Secondary | ICD-10-CM | POA: Diagnosis present

## 2023-07-09 DIAGNOSIS — R296 Repeated falls: Secondary | ICD-10-CM | POA: Diagnosis present

## 2023-07-09 DIAGNOSIS — I509 Heart failure, unspecified: Secondary | ICD-10-CM

## 2023-07-09 DIAGNOSIS — L8990 Pressure ulcer of unspecified site, unspecified stage: Secondary | ICD-10-CM | POA: Insufficient documentation

## 2023-07-09 DIAGNOSIS — D702 Other drug-induced agranulocytosis: Secondary | ICD-10-CM | POA: Diagnosis present

## 2023-07-09 DIAGNOSIS — I5031 Acute diastolic (congestive) heart failure: Secondary | ICD-10-CM | POA: Diagnosis present

## 2023-07-09 DIAGNOSIS — R509 Fever, unspecified: Secondary | ICD-10-CM | POA: Diagnosis not present

## 2023-07-09 DIAGNOSIS — R7401 Elevation of levels of liver transaminase levels: Secondary | ICD-10-CM | POA: Diagnosis not present

## 2023-07-09 DIAGNOSIS — D84821 Immunodeficiency due to drugs: Secondary | ICD-10-CM | POA: Diagnosis present

## 2023-07-09 DIAGNOSIS — E039 Hypothyroidism, unspecified: Secondary | ICD-10-CM | POA: Diagnosis present

## 2023-07-09 DIAGNOSIS — Z79620 Long term (current) use of immunosuppressive biologic: Secondary | ICD-10-CM

## 2023-07-09 DIAGNOSIS — R7989 Other specified abnormal findings of blood chemistry: Secondary | ICD-10-CM | POA: Insufficient documentation

## 2023-07-09 DIAGNOSIS — M059 Rheumatoid arthritis with rheumatoid factor, unspecified: Secondary | ICD-10-CM | POA: Diagnosis present

## 2023-07-09 DIAGNOSIS — Z7982 Long term (current) use of aspirin: Secondary | ICD-10-CM

## 2023-07-09 DIAGNOSIS — J9601 Acute respiratory failure with hypoxia: Secondary | ICD-10-CM | POA: Diagnosis present

## 2023-07-09 DIAGNOSIS — Z7401 Bed confinement status: Secondary | ICD-10-CM

## 2023-07-09 DIAGNOSIS — D709 Neutropenia, unspecified: Secondary | ICD-10-CM

## 2023-07-09 DIAGNOSIS — Z993 Dependence on wheelchair: Secondary | ICD-10-CM

## 2023-07-09 DIAGNOSIS — Z79631 Long term (current) use of antimetabolite agent: Secondary | ICD-10-CM

## 2023-07-09 DIAGNOSIS — R9431 Abnormal electrocardiogram [ECG] [EKG]: Secondary | ICD-10-CM | POA: Diagnosis present

## 2023-07-09 DIAGNOSIS — L89152 Pressure ulcer of sacral region, stage 2: Secondary | ICD-10-CM | POA: Diagnosis present

## 2023-07-09 DIAGNOSIS — F17210 Nicotine dependence, cigarettes, uncomplicated: Secondary | ICD-10-CM | POA: Diagnosis present

## 2023-07-09 DIAGNOSIS — Z79899 Other long term (current) drug therapy: Secondary | ICD-10-CM

## 2023-07-09 DIAGNOSIS — I2609 Other pulmonary embolism with acute cor pulmonale: Secondary | ICD-10-CM | POA: Diagnosis not present

## 2023-07-09 DIAGNOSIS — E46 Unspecified protein-calorie malnutrition: Secondary | ICD-10-CM | POA: Diagnosis not present

## 2023-07-09 DIAGNOSIS — I110 Hypertensive heart disease with heart failure: Secondary | ICD-10-CM | POA: Diagnosis not present

## 2023-07-09 DIAGNOSIS — I2489 Other forms of acute ischemic heart disease: Secondary | ICD-10-CM | POA: Diagnosis not present

## 2023-07-09 DIAGNOSIS — Z79890 Hormone replacement therapy (postmenopausal): Secondary | ICD-10-CM

## 2023-07-09 DIAGNOSIS — E66812 Obesity, class 2: Secondary | ICD-10-CM | POA: Insufficient documentation

## 2023-07-09 DIAGNOSIS — Z8249 Family history of ischemic heart disease and other diseases of the circulatory system: Secondary | ICD-10-CM

## 2023-07-09 LAB — CBC WITH DIFFERENTIAL/PLATELET
Abs Immature Granulocytes: 0 10*3/uL (ref 0.00–0.07)
Basophils Absolute: 0 10*3/uL (ref 0.0–0.1)
Basophils Relative: 0 %
Eosinophils Absolute: 0 10*3/uL (ref 0.0–0.5)
Eosinophils Relative: 0 %
HCT: 35.9 % — ABNORMAL LOW (ref 36.0–46.0)
Hemoglobin: 11.2 g/dL — ABNORMAL LOW (ref 12.0–15.0)
Lymphocytes Relative: 53 %
Lymphs Abs: 1 10*3/uL (ref 0.7–4.0)
MCH: 27.8 pg (ref 26.0–34.0)
MCHC: 31.2 g/dL (ref 30.0–36.0)
MCV: 89.1 fL (ref 80.0–100.0)
Monocytes Absolute: 0.1 10*3/uL (ref 0.1–1.0)
Monocytes Relative: 3 %
Neutro Abs: 0.8 10*3/uL — ABNORMAL LOW (ref 1.7–7.7)
Neutrophils Relative %: 44 %
Platelets: 252 10*3/uL (ref 150–400)
RBC: 4.03 MIL/uL (ref 3.87–5.11)
RDW: 17.2 % — ABNORMAL HIGH (ref 11.5–15.5)
WBC: 1.9 10*3/uL — ABNORMAL LOW (ref 4.0–10.5)
nRBC: 3.1 % — ABNORMAL HIGH (ref 0.0–0.2)

## 2023-07-09 LAB — COMPREHENSIVE METABOLIC PANEL WITH GFR
ALT: 39 U/L (ref 0–44)
AST: 153 U/L — ABNORMAL HIGH (ref 15–41)
Albumin: 2.3 g/dL — ABNORMAL LOW (ref 3.5–5.0)
Alkaline Phosphatase: 60 U/L (ref 38–126)
Anion gap: 8 (ref 5–15)
BUN: 15 mg/dL (ref 6–20)
CO2: 27 mmol/L (ref 22–32)
Calcium: 8.5 mg/dL — ABNORMAL LOW (ref 8.9–10.3)
Chloride: 100 mmol/L (ref 98–111)
Creatinine, Ser: 0.73 mg/dL (ref 0.44–1.00)
GFR, Estimated: >60 mL/min (ref >60)
Glucose, Bld: 75 mg/dL (ref 70–99)
Potassium: 3.7 mmol/L (ref 3.5–5.1)
Sodium: 135 mmol/L (ref 135–145)
Total Bilirubin: 0.6 mg/dL (ref 0.0–1.2)
Total Protein: 9.5 g/dL — ABNORMAL HIGH (ref 6.5–8.1)

## 2023-07-09 LAB — TROPONIN I (HIGH SENSITIVITY)
Troponin I (High Sensitivity): 82 ng/L — ABNORMAL HIGH (ref <18)
Troponin I (High Sensitivity): 108 ng/L (ref <18)

## 2023-07-09 LAB — LACTIC ACID, PLASMA: Lactic Acid, Venous: 1.8 mmol/L (ref 0.5–1.9)

## 2023-07-09 LAB — BRAIN NATRIURETIC PEPTIDE: B Natriuretic Peptide: 566 pg/mL — ABNORMAL HIGH (ref 0.0–100.0)

## 2023-07-09 LAB — D-DIMER, QUANTITATIVE: D-Dimer, Quant: >20 ug{FEU}/mL — ABNORMAL HIGH (ref 0.00–0.50)

## 2023-07-09 MED ORDER — FUROSEMIDE 10 MG/ML IJ SOLN
40.0000 mg | INTRAMUSCULAR | Status: AC
  Administered 2023-07-09: 40 mg via INTRAVENOUS
  Filled 2023-07-09: qty 4

## 2023-07-09 MED ORDER — IOHEXOL 350 MG/ML SOLN
75.0000 mL | Freq: Once | INTRAVENOUS | Status: AC | PRN
  Administered 2023-07-09: 75 mL via INTRAVENOUS

## 2023-07-09 MED ORDER — ENOXAPARIN SODIUM 120 MG/0.8ML IJ SOSY
1.0000 mg/kg | PREFILLED_SYRINGE | Freq: Once | INTRAMUSCULAR | Status: AC
  Administered 2023-07-09: 108 mg via SUBCUTANEOUS
  Filled 2023-07-09: qty 0.8

## 2023-07-09 MED ORDER — CHLORHEXIDINE GLUCONATE CLOTH 2 % EX PADS
6.0000 | MEDICATED_PAD | Freq: Every day | CUTANEOUS | Status: DC
  Administered 2023-07-10 – 2023-07-15 (×5): 6 via TOPICAL

## 2023-07-09 MED ORDER — MELATONIN 3 MG PO TABS
6.0000 mg | ORAL_TABLET | Freq: Every evening | ORAL | Status: DC | PRN
  Administered 2023-07-10 – 2023-07-12 (×4): 6 mg via ORAL
  Filled 2023-07-09 (×4): qty 2

## 2023-07-09 NOTE — ED Provider Notes (Signed)
 [place] EMERGENCY DEPARTMENT AT [facility] Provider Note   CSN: [id number] Arrival date & time: 07/09/23  1103     History  Chief Complaint  Patient presents with   Shortness of Breath   Insomnia    [name] is a 58 y.o. female.   Shortness of Breath Insomnia Associated symptoms include shortness of breath.   58 y/o female =- hx of stroke with L sided deficits - bedbound -to wc, to commode and back - needs full assist by husband - has had increased weakness the last 2 weeks and has had 2 falls while transferring b/c of weakness.  Has had no injuries, no fevers but has been noted to be SOB by family.  They have noticed that she is having increased trouble breathing even though the patient denies being short of breath.  She has rheumatoid arthritis and has missed her last injection because of transportation issues.  She has also not seen her family doctor in several months because of transportation issues and missed a recent visit.  The patient reports to me that she is having trouble sleeping at night, states that she cannot fall asleep so she takes Benadryl which makes her stomach upset, she asked for transport to the hospital today because of her inability to sleep although the daughter who accompanies her states that it is more a shortness of breath problem.  They report that she has had pneumonia in the past when she got like this.  They deny fevers vomiting or diarrhea      Home Medications Prior to Admission medications   Medication Sig Start Date End Date Taking? Authorizing Provider  acetaminophen (TYLENOL) 500 MG tablet Take 1,000 mg by mouth every 6 (six) hours as needed for mild pain.    [provider]  amLODipine (NORVASC) 10 MG tablet Take 10 mg by mouth daily.    [provider]  aspirin EC 325 MG tablet Take 1 tablet (325 mg total) by mouth daily. 08/24/17   Aliene Beams, MD  atorvastatin (LIPITOR) 80 MG tablet Take 80 mg by mouth  daily. Patient not taking: Reported on 05/24/2022    [provider]  folic acid (FOLVITE) 1 MG tablet Take 1 tablet (1 mg total) by mouth daily. 02/22/23   Rice, Jamesetta Orleans, MD  guaiFENesin-dextromethorphan (ROBITUSSIN DM) 100-10 MG/5ML syrup Take 10 mLs by mouth every 8 (eight) hours. Patient not taking: Reported on 05/24/2022 01/18/22   Kendell Bane, MD  HUMIRA, 2 PEN, 40 MG/0.4ML pen INJECT 40 MG INTO THE SKIN EVERY 14 DAYS 06/14/23   Rice, Jamesetta Orleans, MD  hydrALAZINE (APRESOLINE) 50 MG tablet Take 1 tablet (50 mg total) by mouth every 8 (eight) hours. 01/18/22 03/19/22  Shahmehdi, Gemma Payor, MD  ipratropium (ATROVENT) 0.02 % nebulizer solution Take 2.5 mLs (0.5 mg total) by nebulization 3 (three) times daily. Patient not taking: Reported on 05/24/2022 01/18/22   Kendell Bane, MD  levalbuterol Pauline Aus) 0.63 MG/3ML nebulizer solution Take 3 mLs (0.63 mg total) by nebulization 3 (three) times daily. Patient not taking: Reported on 05/24/2022 01/18/22   Kendell Bane, MD  levothyroxine (SYNTHROID) 50 MCG tablet Take 1 tablet (50 mcg total) by mouth daily. Patient not taking: Reported on 05/24/2022 01/18/22   Kendell Bane, MD  methotrexate (RHEUMATREX) 2.5 MG tablet Take 6 tablets (15 mg total) by mouth once a week. Caution:Chemotherapy. Protect from light. 02/22/23   Fuller Plan, MD  Multiple Vitamin (MULTIVITAMIN WITH  MINERALS) TABS tablet Take 1 tablet by mouth daily. Patient not taking: Reported on 05/24/2022    [provider]  nicotine (NICODERM CQ - DOSED IN MG/24 HOURS) 14 mg/24hr patch Place 1 patch (14 mg total) onto the skin daily. Patient not taking: Reported on 05/24/2022 01/19/22   Kendell Bane, MD  traZODone (DESYREL) 100 MG tablet Take by mouth. 05/09/22   [provider]  Vitamin D, Ergocalciferol, (DRISDOL) 1.25 MG (50000 UNIT) CAPS capsule Take 50,000 Units by mouth once a week. 01/06/23   [provider]       Allergies    Lisinopril    Review of Systems   Review of Systems  Respiratory:  Positive for shortness of breath.   Psychiatric/Behavioral:  The patient has insomnia.   All other systems reviewed and are negative.   Physical Exam Updated Vital Signs BP 109/61   Pulse (!) 103   Temp 98.3 F (36.8 C)   Resp (!) 37   Ht 1.676 m (5\' 6" )   Wt 108.9 kg   LMP 07/29/2012   SpO2 94%   BMI 38.75 kg/m  Physical Exam Vitals and nursing note reviewed.  Constitutional:      General: She is not in acute distress.    Appearance: She is well-developed.  HENT:     Head: Normocephalic and atraumatic.     Nose: No congestion or rhinorrhea.     Mouth/Throat:     Mouth: Mucous membranes are moist.     Pharynx: No oropharyngeal exudate.  Eyes:     General: No scleral icterus.       Right eye: No discharge.        Left eye: No discharge.     Conjunctiva/sclera: Conjunctivae normal.     Pupils: Pupils are equal, round, and reactive to light.  Neck:     Thyroid: No thyromegaly.     Vascular: No JVD.  Cardiovascular:     Rate and Rhythm: Regular rhythm. Tachycardia present.     Heart sounds: Normal heart sounds. No murmur heard.    No friction rub. No gallop.     Comments: Heart rate of about 110 bpm Pulmonary:     Effort: No respiratory distress.     Breath sounds: Normal breath sounds. No wheezing or rales.     Comments: Breath sounds are clear, patient speaks in just shortened sentences, she is tachypneic to about 30 breaths/min Abdominal:     General: Bowel sounds are normal. There is no distension.     Palpations: Abdomen is soft. There is no mass.     Tenderness: There is no abdominal tenderness.  Musculoskeletal:        General: No tenderness. Normal range of motion.     Cervical back: Normal range of motion and neck supple.     Right lower leg: Edema present.     Left lower leg: Edema present.     Comments: Symmetrical 1+ pitting edema bilateral lower extremities   Lymphadenopathy:     Cervical: No cervical adenopathy.  Skin:    General: Skin is warm and dry.     Findings: No erythema or rash.  Neurological:     Mental Status: She is alert.     Comments: The patient is able to grip with the right arm and left her right leg but has severe weakness of the left arm and left leg, slight left-sided facial droop, all consistent with prior stroke  Psychiatric:  Behavior: Behavior normal.     ED Results / Procedures / Treatments   Labs (all labs ordered are listed, but only abnormal results are displayed) Labs Reviewed  BRAIN NATRIURETIC PEPTIDE  COMPREHENSIVE METABOLIC PANEL WITH GFR  CBC WITH DIFFERENTIAL/PLATELET  TROPONIN I (HIGH SENSITIVITY)    EKG None  Radiology No results found.  Procedures Procedures    Medications Ordered in ED Medications - No data to display  ED Course/ Medical Decision Making/ A&P                                 Medical Decision Making Amount and/or Complexity of Data Reviewed Labs: ordered. Radiology: ordered.  Risk Prescription drug management.    This patient presents to the ED for concern of shortness of breath and lack of sleep, this involves an extensive number of treatment options, and is a complaint that carries with it a high risk of complications and morbidity.  The differential diagnosis includes congestive heart failure, pulmonary embolism, pneumonia, pneumothorax, COPD, insomnia, pulmonary hypertension   Co morbidities that complicate the patient evaluation  Prior stroke, immunosuppressed on Humira though has not had her last dose   Additional history obtained:  Additional history obtained from medical record External records from outside source obtained and reviewed including admitted in October 2023 with sepsis   Lab Tests:  I Ordered, and personally interpreted labs.  The pertinent results include: BNP elevated over 500, CBC without significant findings   Imaging  Studies ordered:  I ordered imaging studies including chest x-ray I independently visualized and interpreted imaging which showed distended pulmonary vasculature but no infiltrates I agree with the radiologist interpretation   Cardiac Monitoring: / EKG:  The patient was maintained on a cardiac monitor.  I personally viewed and interpreted the cardiac monitored which showed an underlying rhythm of: Sinus tachycardia   Consultations Obtained:  On coming physician Dr. Cathren Laine will follow-up results and disposition accordingly, anticipate admission as the patient has fluid overload and I suspect CHF is part of the process.  Other labs pending at this time including metabolic panel, D-dimer, troponin.  The patient had difficult IV access  Lasix given   Social Determinants of Health:  [email or web address]   Test / Admission - Considered:  admit         Final Clinical Impression(s) / ED Diagnoses Final diagnoses:  None    Rx / DC Orders ED Discharge Orders     None         Eber Hong, MD 07/09/23 1533

## 2023-07-09 NOTE — H&P (Incomplete)
 History and Physical    Patient: [name] [id number]:[id number] DOB: 05-28-1965 DOA: 07/09/2023 DOS: the patient was seen and examined on 07/09/2023 PCP: [facility]  Patient coming from: Home  Chief Complaint:  Chief Complaint  Patient presents with  . Shortness of Breath  . Insomnia   HPI: [name] is a 58 y.o. female with medical history significant of hypertension, hyperlipidemia, acquired hypothyroidism, reportedly no longer on levothyroxine, CVA with residual left-sided weakness, tobacco use disorder who presents to the emergency department from home via EMS due to 2-week onset of increased shortness of breath with difficulty in being able to sleep at night, so she has been taking Benadryl with subsequent stomach upset.  Family thought that her shortness of breath was the cause of difficulty to sleep at night. Patient also endorsed increased weakness and have sustained 2 falls during transfer due to weakness.  Patient is bed bound -wheelchair bound at baseline  ED Course:  In the emergency department, she was tachypneic, tachycardic, BP was 109/61, O2 sat dropped to 87% on room air, supplemental oxygen via [employer] at 2 LPM was provided with improved O2 sat to 93%.  Other vital signs are within normal range.  Workup in the ED showed CBC with leukopenia, normocytic anemia.  BMP was normal, albumin 2.3, AST 153, D-dimer > 20.0, BNP 566, troponin 82 > 108.  Lactic acid was normal, blood culture pending. Chest x-ray showed cardiomegaly with distended pulmonary vasculature.  Lower lung volumes with left basilar atelectasis CT angiography of chest with contrast to rule out pulmonary embolism was attempted, but the contrast infiltrated, so, therapeutic Lovenox was given with plan to do the scan in the morning She was treated with IV Lasix 40 mg x 1.  Hospitalist was asked to admit patient for further evaluation and management.  Review of Systems: Review of systems as  noted in the HPI. All other systems reviewed and are negative.   Past Medical History:  Diagnosis Date  . High cholesterol   . Hypertension   . Hypothyroidism   . Stroke [facility])    weakness on left side.  . Vaginal Pap smear, abnormal    Past Surgical History:  Procedure Laterality Date  . DILATION AND CURETTAGE OF UTERUS    . LASER ABLATION CONDOLAMATA Bilateral 09/27/2017   Procedure: LASER ABLATION CONDYLOMA AND REMOVAL OF PERIANAL WART;  Surgeon: [name], MD;  Location: [facility];  Service: Gynecology;  Laterality: Bilateral;  . MASS EXCISION Left 09/27/2017   Procedure: EXCISION LESION LEFT THIGH;  Surgeon: [name], MD;  Location: [facility];  Service: Gynecology;  Laterality: Left;    Social History:  reports that she has been smoking cigarettes. She has a 16 pack-year smoking history. She has never used smokeless tobacco. She reports current drug use. Drug: Marijuana. She reports that she does not drink alcohol.   Allergies  Allergen Reactions  . Lisinopril Nausea Only    Family History  Problem Relation Age of Onset  . Hypertension Mother   . Liver disease Mother   . Heart attack Father   . HIV Sister   . Arrhythmia Sister   . Mood Disorder Sister   . Arrhythmia Brother   . CAD Brother     ***  Prior to Admission medications   Medication Sig Start Date End Date Taking? Authorizing Provider  acetaminophen (TYLENOL) 500 MG tablet Take 1,000 mg by mouth every 6 (six) hours as needed for mild  pain.   Yes [provider]  amLODipine (NORVASC) 10 MG tablet Take 10 mg by mouth daily.   Yes [provider]  aspirin EC 325 MG tablet Take 1 tablet (325 mg total) by mouth daily. 08/24/17  Yes Hagler, Fleet Contras, MD  atorvastatin (LIPITOR) 80 MG tablet Take 80 mg by mouth daily.   Yes [provider]  HUMIRA, 2 PEN, 40 MG/0.4ML pen INJECT 40 MG INTO THE SKIN EVERY 14 DAYS 06/14/23  Yes Rice, Jamesetta Orleans, MD  hydrALAZINE (APRESOLINE) 50 MG tablet  Take 1 tablet (50 mg total) by mouth every 8 (eight) hours. 01/18/22 07/09/23 Yes Shahmehdi, Gemma Payor, MD  methotrexate (RHEUMATREX) 2.5 MG tablet Take 6 tablets (15 mg total) by mouth once a week. Caution:Chemotherapy. Protect from light. 02/22/23  Yes Rice, Jamesetta Orleans, MD  traZODone (DESYREL) 100 MG tablet Take 100 mg by mouth at bedtime. 05/09/22  Yes [provider]  Vitamin D, Ergocalciferol, (DRISDOL) 1.25 MG (50000 UNIT) CAPS capsule Take 50,000 Units by mouth once a week. 01/06/23  Yes [provider]    Physical Exam: BP 119/73   Pulse (!) 101   Temp 98.3 F (36.8 C) (Oral)   Resp (!) 34   Ht 5\' 6"  (1.676 m)   Wt 108.9 kg   LMP 07/29/2012   SpO2 98%   BMI 38.75 kg/m   General: 58 y.o. year-old female well developed well nourished in no acute distress.  Alert and oriented x3. HEENT: NCAT, EOMI Neck: Supple, trachea medial Cardiovascular: Regular rate and rhythm with no rubs or gallops.  No thyromegaly or JVD noted.  Trace lower extremity edema. 2/4 pulses in all 4 extremities. Respiratory: Tachypnea, bilateral Rales in lower lobes.   Abdomen: Soft, nontender nondistended with normal bowel sounds x4 quadrants. Muskuloskeletal: No cyanosis, clubbing or edema noted bilaterally Neuro: Left-sided weakness.  strength 5/5 on RUE and RLE,. Skin: No ulcerative lesions noted or rashes Psychiatry: Judgement and insight appear normal. Mood is appropriate for condition and setting          Labs on Admission:  Basic Metabolic Panel: Recent Labs  Lab 07/09/23 1505  NA 135  K 3.7  CL 100  CO2 27  GLUCOSE 75  BUN 15  CREATININE 0.73  CALCIUM 8.5*   Liver Function Tests: Recent Labs  Lab 07/09/23 1505  AST 153*  ALT 39  ALKPHOS 60  BILITOT 0.6  PROT 9.5*  ALBUMIN 2.3*   No results for input(s): "LIPASE", "AMYLASE" in the last 168 hours. No results for input(s): "AMMONIA" in the last 168 hours. CBC: Recent Labs  Lab 07/09/23 1320  WBC 1.9*   NEUTROABS 0.8*  HGB 11.2*  HCT 35.9*  MCV 89.1  PLT 252   Cardiac Enzymes: No results for input(s): "CKTOTAL", "CKMB", "CKMBINDEX", "TROPONINI" in the last 168 hours.  BNP (last 3 results) Recent Labs    07/09/23 1320  BNP 566.0*    ProBNP (last 3 results) No results for input(s): "PROBNP" in the last 8760 hours.  CBG: No results for input(s): "GLUCAP" in the last 168 hours.  Radiological Exams on Admission: DG Chest Portable 1 View Result Date: 07/09/2023 CLINICAL DATA:  Shortness of breath. EXAM: PORTABLE CHEST 1 VIEW COMPARISON:  01/17/2022. FINDINGS: Heart is enlarged and the mediastinal contour is stable. Pulmonary vasculature is mildly distended. Lung volumes are low with mild atelectasis at the left lung base. No consolidation, effusion, or pneumothorax. No acute osseous abnormality. IMPRESSION: 1. Cardiomegaly with distended pulmonary  vasculature. 2. Low lung volumes with left basilar atelectasis. Electronically Signed   By: Thornell Sartorius M.D.   On: 07/09/2023 12:56    EKG: I independently viewed the EKG done and my findings are as followed: Normal sinus rhythm at rate of 99 bpm with QTc of 505 ms  Assessment/Plan Present on Admission: **None**  Principal Problem:   Acute CHF (congestive heart failure) (HCC)   DVT prophylaxis: ***   Code Status: ***   Family Communication: ***   Disposition Plan: ***   Consults called: ***   Admission status: ***     Frankey Shown MD [facility] Pager [phone]  If 7PM-7AM, please contact night-coverage [email or web address] Password [employer]  07/09/2023, 11:29 PM      Review of Systems: {ROS_Text:26778} Past Medical History:  Diagnosis Date  . High cholesterol   . Hypertension   . Hypothyroidism   . Stroke [facility])    weakness on left side.  . Vaginal Pap smear, abnormal    Past Surgical History:  Procedure Laterality Date  . DILATION AND CURETTAGE OF UTERUS    . LASER ABLATION CONDOLAMATA Bilateral  09/27/2017   Procedure: LASER ABLATION CONDYLOMA AND REMOVAL OF PERIANAL WART;  Surgeon: [name], MD;  Location: [facility];  Service: Gynecology;  Laterality: Bilateral;  . MASS EXCISION Left 09/27/2017   Procedure: EXCISION LESION LEFT THIGH;  Surgeon: [name], MD;  Location: [facility];  Service: Gynecology;  Laterality: Left;   Social History:  reports that she has been smoking cigarettes. She has a 16 pack-year smoking history. She has never used smokeless tobacco. She reports current drug use. Drug: Marijuana. She reports that she does not drink alcohol.  Allergies  Allergen Reactions  . Lisinopril Nausea Only    Family History  Problem Relation Age of Onset  . Hypertension Mother   . Liver disease Mother   . Heart attack Father   . HIV Sister   . Arrhythmia Sister   . Mood Disorder Sister   . Arrhythmia Brother   . CAD Brother     Prior to Admission medications   Medication Sig Start Date End Date Taking? Authorizing Provider  acetaminophen (TYLENOL) 500 MG tablet Take 1,000 mg by mouth every 6 (six) hours as needed for mild pain.   Yes [provider]  amLODipine (NORVASC) 10 MG tablet Take 10 mg by mouth daily.   Yes [provider]  aspirin EC 325 MG tablet Take 1 tablet (325 mg total) by mouth daily. 08/24/17  Yes Hagler, Fleet Contras, MD  atorvastatin (LIPITOR) 80 MG tablet Take 80 mg by mouth daily.   Yes [provider]  HUMIRA, 2 PEN, 40 MG/0.4ML pen INJECT 40 MG INTO THE SKIN EVERY 14 DAYS 06/14/23  Yes Rice, Jamesetta Orleans, MD  hydrALAZINE (APRESOLINE) 50 MG tablet Take 1 tablet (50 mg total) by mouth every 8 (eight) hours. 01/18/22 07/09/23 Yes Shahmehdi, Gemma Payor, MD  methotrexate (RHEUMATREX) 2.5 MG tablet Take 6 tablets (15 mg total) by mouth once a week. Caution:Chemotherapy. Protect from light. 02/22/23  Yes Rice, Jamesetta Orleans, MD  traZODone (DESYREL) 100 MG tablet Take 100 mg by mouth at bedtime. 05/09/22  Yes [provider]   Vitamin D, Ergocalciferol, (DRISDOL) 1.25 MG (50000 UNIT) CAPS capsule Take 50,000 Units by mouth once a week. 01/06/23  Yes [provider]    Physical Exam: Vitals:   07/09/23 1700 07/09/23 1715 07/09/23 1845 07/09/23 2015  BP: 110/80 114/77 118/72  Pulse: 97 96  (!) 101  Resp: (!) 27 19 (!) 33 (!) 24  Temp:      TempSrc:      SpO2: 96% 92%  92%  Weight:      Height:       *** Data Reviewed: {Tip this will not be part of the note when signed- Document your independent interpretation of telemetry tracing, EKG, lab, Radiology test or any other diagnostic tests. Add any new diagnostic test ordered today. (Optional):26781} {Results:26384}  Assessment and Plan: No notes have been filed under this hospital service. Service: Hospitalist     Advance Care Planning:   Code Status: Prior ***  Consults: ***  Family Communication: ***  Severity of Illness: {Observation/Inpatient:21159}  Author: Frankey Shown, DO 07/09/2023 8:55 PM  For on call review [email or web address].

## 2023-07-09 NOTE — ED Notes (Signed)
 Multiple attempts to get IV on pt by nurses and MD. Ultrasound guided IV's blown as well. Now have 24 in right hand and was able to pull small amount of blood. Lab has attempted to draw multiple times as well and unsuccessful.

## 2023-07-09 NOTE — H&P (Signed)
 History and Physical    Patient: [name] [id number] DOB: Feb 22, 1966 DOA: 07/09/2023 DOS: the patient was seen and examined on 07/10/2023 PCP: [facility]  Patient coming from: Home  Chief Complaint:  Chief Complaint  Patient presents with   Shortness of Breath   Insomnia   HPI: [name] is a 58 y.o. female with medical history significant of hypertension, hyperlipidemia, acquired hypothyroidism, reportedly no longer on levothyroxine, CVA with residual left-sided weakness, tobacco use disorder who presents to the emergency department from home via EMS due to 2-week onset of increased shortness of breath with difficulty in being able to sleep at night, so she has been taking Benadryl with subsequent stomach upset.  Family thought that her shortness of breath was the cause of difficulty to sleep at night. Patient also endorsed increased weakness and have sustained 2 falls during transfer due to weakness.  Patient is bed bound -wheelchair bound at baseline  ED Course:  In the emergency department, she was tachypneic, tachycardic, BP was 109/61, O2 sat dropped to 87% on room air, supplemental oxygen via [place] at 2 LPM was provided with improved O2 sat to 93%.  Other vital signs are within normal range.  Workup in the ED showed CBC with leukopenia, normocytic anemia.  BMP was normal, albumin 2.3, AST 153, D-dimer > 20.0, BNP 566, troponin 82 > 108.  Lactic acid was normal, blood culture pending. Chest x-ray showed cardiomegaly with distended pulmonary vasculature.  Lower lung volumes with left basilar atelectasis CT angiography of chest with contrast to rule out pulmonary embolism was attempted, but the contrast infiltrated, so, therapeutic Lovenox was given with plan to do the scan in the morning She was treated with IV Lasix 40 mg x 1.  Hospitalist was asked to admit patient for further evaluation and management.  Review of Systems: Review of systems as  noted in the HPI. All other systems reviewed and are negative.   Past Medical History:  Diagnosis Date   High cholesterol    Hypertension    Hypothyroidism    Stroke (HCC)    weakness on left side.   Vaginal Pap smear, abnormal    Past Surgical History:  Procedure Laterality Date   DILATION AND CURETTAGE OF UTERUS     LASER ABLATION CONDOLAMATA Bilateral 09/27/2017   Procedure: LASER ABLATION CONDYLOMA AND REMOVAL OF PERIANAL WART;  Surgeon: [name], MD;  Location: [facility];  Service: Gynecology;  Laterality: Bilateral;   MASS EXCISION Left 09/27/2017   Procedure: EXCISION LESION LEFT THIGH;  Surgeon: [name], MD;  Location: [facility];  Service: Gynecology;  Laterality: Left;    Social History:  reports that she has been smoking cigarettes. She has a 16 pack-year smoking history. She has never used smokeless tobacco. She reports current drug use. Drug: Marijuana. She reports that she does not drink alcohol.   Allergies  Allergen Reactions   Lisinopril Nausea Only    Family History  Problem Relation Age of Onset   Hypertension Mother    Liver disease Mother    Heart attack Father    HIV Sister    Arrhythmia Sister    Mood Disorder Sister    Arrhythmia Brother    CAD Brother      Prior to Admission medications   Medication Sig Start Date End Date Taking? Authorizing Provider  acetaminophen (TYLENOL) 500 MG tablet Take 1,000 mg by mouth every 6 (six) hours as needed for mild pain.  Yes [provider]  amLODipine (NORVASC) 10 MG tablet Take 10 mg by mouth daily.   Yes [provider]  aspirin EC 325 MG tablet Take 1 tablet (325 mg total) by mouth daily. 08/24/17  Yes Hagler, Fleet Contras, MD  atorvastatin (LIPITOR) 80 MG tablet Take 80 mg by mouth daily.   Yes [provider]  HUMIRA, 2 PEN, 40 MG/0.4ML pen INJECT 40 MG INTO THE SKIN EVERY 14 DAYS 06/14/23  Yes Rice, Jamesetta Orleans, MD  hydrALAZINE (APRESOLINE) 50 MG tablet Take 1 tablet (50 mg  total) by mouth every 8 (eight) hours. 01/18/22 07/09/23 Yes Shahmehdi, Gemma Payor, MD  methotrexate (RHEUMATREX) 2.5 MG tablet Take 6 tablets (15 mg total) by mouth once a week. Caution:Chemotherapy. Protect from light. 02/22/23  Yes Rice, Jamesetta Orleans, MD  traZODone (DESYREL) 100 MG tablet Take 100 mg by mouth at bedtime. 05/09/22  Yes [provider]  Vitamin D, Ergocalciferol, (DRISDOL) 1.25 MG (50000 UNIT) CAPS capsule Take 50,000 Units by mouth once a week. 01/06/23  Yes [provider]    Physical Exam: BP 119/73   Pulse (!) 101   Temp 98.3 F (36.8 C) (Oral)   Resp (!) 34   Ht 5\' 6"  (1.676 m)   Wt 108.9 kg   LMP 07/29/2012   SpO2 98%   BMI 38.75 kg/m   General: 58 y.o. year-old female well developed well nourished in no acute distress.  Alert and oriented x3. HEENT: NCAT, EOMI Neck: Supple, trachea medial Cardiovascular: Regular rate and rhythm with no rubs or gallops.  No thyromegaly or JVD noted.  Trace lower extremity edema. 2/4 pulses in all 4 extremities. Respiratory: Tachypnea, bilateral Rales in lower lobes.   Abdomen: Soft, nontender nondistended with normal bowel sounds x4 quadrants. Muskuloskeletal: No cyanosis, clubbing or edema noted bilaterally Neuro: Left-sided weakness.  strength 5/5 on RUE and RLE,. Skin: No ulcerative lesions noted or rashes Psychiatry: Judgement and insight appear normal. Mood is appropriate for condition and setting          Labs on Admission:  Basic Metabolic Panel: Recent Labs  Lab 07/09/23 1505  NA 135  K 3.7  CL 100  CO2 27  GLUCOSE 75  BUN 15  CREATININE 0.73  CALCIUM 8.5*   Liver Function Tests: Recent Labs  Lab 07/09/23 1505  AST 153*  ALT 39  ALKPHOS 60  BILITOT 0.6  PROT 9.5*  ALBUMIN 2.3*   No results for input(s): "LIPASE", "AMYLASE" in the last 168 hours. No results for input(s): "AMMONIA" in the last 168 hours. CBC: Recent Labs  Lab 07/09/23 1320  WBC 1.9*  NEUTROABS 0.8*  HGB 11.2*   HCT 35.9*  MCV 89.1  PLT 252   Cardiac Enzymes: No results for input(s): "CKTOTAL", "CKMB", "CKMBINDEX", "TROPONINI" in the last 168 hours.  BNP (last 3 results) Recent Labs    07/09/23 1320  BNP 566.0*    ProBNP (last 3 results) No results for input(s): "PROBNP" in the last 8760 hours.  CBG: No results for input(s): "GLUCAP" in the last 168 hours.  Radiological Exams on Admission: DG Chest Portable 1 View Result Date: 07/09/2023 CLINICAL DATA:  Shortness of breath. EXAM: PORTABLE CHEST 1 VIEW COMPARISON:  01/17/2022. FINDINGS: Heart is enlarged and the mediastinal contour is stable. Pulmonary vasculature is mildly distended. Lung volumes are low with mild atelectasis at the left lung base. No consolidation, effusion, or pneumothorax. No acute osseous abnormality. IMPRESSION: 1. Cardiomegaly with distended pulmonary vasculature. 2. Low  lung volumes with left basilar atelectasis. Electronically Signed   By: Thornell Sartorius M.D.   On: 07/09/2023 12:56    EKG: I independently viewed the EKG done and my findings are as followed: Normal sinus rhythm at rate of 99 bpm with QTc of 505 ms  Assessment/Plan Present on Admission:  Acute respiratory failure with hypoxia (HCC)  Essential hypertension  CVA (cerebral vascular accident) (HCC)  Mixed hyperlipidemia  Principal Problem:   Acute CHF (congestive heart failure) (HCC) Active Problems:   Essential hypertension   CVA (cerebral vascular accident) (HCC)   Mixed hyperlipidemia   Acute respiratory failure with hypoxia (HCC)   Elevated brain natriuretic peptide (BNP) level   Elevated d-dimer   Elevated troponin   Leukopenia   Prolonged QT interval   Hypoalbuminemia due to protein-calorie malnutrition (HCC)   Elevated AST (SGOT)   Obesity, Class II, BMI 35-39.9  Acute CHF Elevated BNP Continue total input/output, daily weights and fluid restriction IV Lasix 40 mg x 1 was given in the ED, we will continue with IV Lasix 40 twice  daily (with holding parameters) Continue heart healthy diet  Echocardiogram in the morning   Acute respiratory failure with hypoxia Continue supplemental oxygen to maintain O2 sats > 92% with plan to wean patient off this as tolerated.  Patient does not use supplemental oxygen at baseline.  Elevated D-dimer rule out pulm embolus D-dimer > 20.0 IV contrast infiltrated, so, therapeutic Lovenox was given with plan to do CT scan in the morning  Elevated troponin possible secondary to type II demand ischemia Troponin 82 > 108, she denies chest pain, continue to trend troponin Continue telemetry  Leukopenia possibly due to methotrexate effect WBC 1.9, continue to monitor for CBC  Prolonged QT interval QTc 505 ms Avoid QT prolonging drugs Magnesium level will be checked Repeat EKG in the morning  Hypoalbuminemia possibly secondary to moderate protein calorie malnutrition Albumin 2.3; protein supplement will be provided  Elevated AST  AST 153.  Continue to monitor liver enzymes  Essential hypertension Continue Lasix as described above for CHF  CVA Continue aspirin per home regimen Statin temporarily held due to elevated AST  Mixed hyperlipidemia Statin will be held at this time due to elevated AST  Class II obesity (BMI 38.75) Patient was counseled about the cardiovascular and metabolic risk of morbid obesity. Patient was counseled for diet control, exercise regimen and weight loss.    DVT prophylaxis: Lovenox  Code Status: Full code  Family Communication: Husband at bedside (all questions answered to satisfaction)  Consults: None  Severity of Illness: The appropriate patient status for this patient is INPATIENT. Inpatient status is judged to be reasonable and necessary in order to provide the required intensity of service to ensure the patient's safety. The patient's presenting symptoms, physical exam findings, and initial radiographic and laboratory data in the context  of their chronic comorbidities is felt to place them at high risk for further clinical deterioration. Furthermore, it is not anticipated that the patient will be medically stable for discharge from the hospital within 2 midnights of admission.   * I certify that at the point of admission it is my clinical judgment that the patient will require inpatient hospital care spanning beyond 2 midnights from the point of admission due to high intensity of service, high risk for further deterioration and high frequency of surveillance required.*  Author: Frankey Shown, DO 07/10/2023 12:49 AM  For on call review [email or web address].

## 2023-07-09 NOTE — ED Notes (Signed)
 Lab states some vials of blood have hemolyzed. This nurse attempted to collect need blood  but unsuccessful. Phlebotomy coming back to attempt again.

## 2023-07-09 NOTE — ED Notes (Signed)
 Date and time results received: 07/09/23 2155 (use smartphrase ".now" to insert current time)  Test: troponin Critical Value: 108  Name of Provider Notified: Adefeso  Orders Received? Or Actions Taken?: n/a

## 2023-07-09 NOTE — ED Triage Notes (Addendum)
 Pt bib ems from home complaining of SOB for the past few weeks. Pt also endorses not being able to sleep. Denies CP and sick contact. Hx of rheumatoid arthritis, stroke w/ L deficits, and thyroid issues. Takes BP but says she hasn't taken her morning dose. BP currently 109/61 Sats @ 94 EMS called by Daughter Pt also complaining of hearing water in her ear

## 2023-07-09 NOTE — ED Notes (Signed)
 Pt needs bigger IV for CT angio. Multiple attempts today using ultrasound. Dr. Hyacinth Meeker attempted twice and both times unsuccessful. Asher Muir RN attempted twice with ultrasound as well. Pt was finally able to get 24 g in hand but unable to use for CTA. Dr. Denton Lank and charge nurse notified.

## 2023-07-09 NOTE — ED Provider Notes (Addendum)
 Patient signd out by Dr Hyacinth Meeker that patient is mildly hypoxic, has been having some trouble sleeping for past two weeks, ?orthopnea, ?sleep apnea, and to check pending labs and call hospitalist for admission. Indicates initial trop mildly high, no chest pain. Indicates bnp elev, and lasix dose given.   Pt is awake and alert. No severe resp distress. On room air sats 88%. On 2 liters, sats 96%.   Pt denies any chest pain. Bil legs with symmetric edema, no unilateral leg pain or swelling. No pleuritic pain.   Results for orders placed or performed during the hospital encounter of 07/09/23  Blood culture (routine x 2)   Collection Time: 07/09/23 12:27 PM   Specimen: BLOOD  Result Value Ref Range   Specimen Description BLOOD BLOOD LEFT ARM    Special Requests      AEROBIC BOTTLE ONLY Blood Culture results may not be optimal due to an inadequate volume of blood received in culture bottles Performed at [facility], [address] [place], [place] [address]    Culture PENDING    Report Status PENDING   Blood culture (routine x 2)   Collection Time: 07/09/23 12:27 PM   Specimen: BLOOD  Result Value Ref Range   Specimen Description BLOOD BLOOD LEFT HAND    Special Requests      AEROBIC BOTTLE ONLY Blood Culture results may not be optimal due to an inadequate volume of blood received in culture bottles Performed at [facility], [address], [place], [place] [address]    Culture PENDING    Report Status PENDING   Lactic acid, plasma   Collection Time: 07/09/23  1:20 PM  Result Value Ref Range   Lactic Acid, Venous 1.8 0.5 - 1.9 mmol/L  CBC with Differential/Platelet   Collection Time: 07/09/23  1:20 PM  Result Value Ref Range   WBC 1.9 (L) 4.0 - 10.5 K/uL   RBC 4.03 3.87 - 5.11 MIL/uL   Hemoglobin 11.2 (L) 12.0 - 15.0 g/dL   HCT 52.8 (L) 41.3 - 24.4 %   MCV 89.1 80.0 - 100.0 fL   MCH 27.8 26.0 - 34.0 pg   MCHC 31.2 30.0 - 36.0 g/dL   RDW 01.0 (H) 27.2 - 53.6 %   Platelets 252 150  - 400 K/uL   nRBC 3.1 (H) 0.0 - 0.2 %   Neutrophils Relative % 44 %   Neutro Abs 0.8 (L) 1.7 - 7.7 K/uL   Lymphocytes Relative 53 %   Lymphs Abs 1.0 0.7 - 4.0 K/uL   Monocytes Relative 3 %   Monocytes Absolute 0.1 0.1 - 1.0 K/uL   Eosinophils Relative 0 %   Eosinophils Absolute 0.0 0.0 - 0.5 K/uL   Basophils Relative 0 %   Basophils Absolute 0.0 0.0 - 0.1 K/uL   WBC Morphology See Note    RBC Morphology See Note    Smear Review MORPHOLOGY UNREMARKABLE    Abs Immature Granulocytes 0.00 0.00 - 0.07 K/uL   Abnormal Lymphocytes Present PRESENT    Reactive, Benign Lymphocytes PRESENT   Brain natriuretic peptide   Collection Time: 07/09/23  1:20 PM  Result Value Ref Range   B Natriuretic Peptide 566.0 (H) 0.0 - 100.0 pg/mL  Comprehensive metabolic panel with GFR   Collection Time: 07/09/23  3:05 PM  Result Value Ref Range   Sodium 135 135 - 145 mmol/L   Potassium 3.7 3.5 - 5.1 mmol/L   Chloride 100 98 - 111 mmol/L   CO2 27  22 - 32 mmol/L   Glucose, Bld 75 70 - 99 mg/dL   BUN 15 6 - 20 mg/dL   Creatinine, Ser 7.82 0.44 - 1.00 mg/dL   Calcium 8.5 (L) 8.9 - 10.3 mg/dL   Total Protein 9.5 (H) 6.5 - 8.1 g/dL   Albumin 2.3 (L) 3.5 - 5.0 g/dL   AST 956 (H) 15 - 41 U/L   ALT 39 0 - 44 U/L   Alkaline Phosphatase 60 38 - 126 U/L   Total Bilirubin 0.6 0.0 - 1.2 mg/dL   GFR, Estimated >21 >30 mL/min   Anion gap 8 5 - 15  D-dimer, quantitative   Collection Time: 07/09/23  3:05 PM  Result Value Ref Range   D-Dimer, Quant >20.00 (H) 0.00 - 0.50 ug/mL-FEU  Troponin I (High Sensitivity)   Collection Time: 07/09/23  3:05 PM  Result Value Ref Range   Troponin I (High Sensitivity) 82 (H) <18 ng/L   DG Chest Portable 1 View Result Date: 07/09/2023 CLINICAL DATA:  Shortness of breath. EXAM: PORTABLE CHEST 1 VIEW COMPARISON:  01/17/2022. FINDINGS: Heart is enlarged and the mediastinal contour is stable. Pulmonary vasculature is mildly distended. Lung volumes are low with mild atelectasis at the  left lung base. No consolidation, effusion, or pneumothorax. No acute osseous abnormality. IMPRESSION: 1. Cardiomegaly with distended pulmonary vasculature. 2. Low lung volumes with left basilar atelectasis. Electronically Signed   By: Thornell Sartorius M.D.   On: 07/09/2023 12:56    Delta trop is pending. Pt denies chest pain.   Ddimer is high. CTA was ordered - staff had placed new iv.   CT tech indicates contrast infiltrated. Indicates can retry CTA or VQ scan in AM tomorrow.   On recheck arm, no arm pain. No severe swelling noted. Compartments of arm are soft, not tense, no severe swelling noted. Radial pulse 2+. No pain w passive rom extremity.   Hospitalists consulted for admission. Discussed pt. Delta trop pending. Hospitalists reqeusts we give a dose of lovenox (they will f/u on delta trop and will f/u on getting vq or cta in AM). Lovenox dose ordered.        Cathren Laine, MD 07/09/23 [phone]

## 2023-07-10 ENCOUNTER — Inpatient Hospital Stay (HOSPITAL_COMMUNITY)

## 2023-07-10 ENCOUNTER — Other Ambulatory Visit (HOSPITAL_COMMUNITY): Payer: Self-pay | Admitting: *Deleted

## 2023-07-10 ENCOUNTER — Other Ambulatory Visit (HOSPITAL_COMMUNITY)

## 2023-07-10 DIAGNOSIS — R7401 Elevation of levels of liver transaminase levels: Secondary | ICD-10-CM | POA: Insufficient documentation

## 2023-07-10 DIAGNOSIS — R7989 Other specified abnormal findings of blood chemistry: Secondary | ICD-10-CM | POA: Insufficient documentation

## 2023-07-10 DIAGNOSIS — E46 Unspecified protein-calorie malnutrition: Secondary | ICD-10-CM | POA: Insufficient documentation

## 2023-07-10 DIAGNOSIS — E66812 Obesity, class 2: Secondary | ICD-10-CM | POA: Insufficient documentation

## 2023-07-10 DIAGNOSIS — I509 Heart failure, unspecified: Secondary | ICD-10-CM | POA: Diagnosis not present

## 2023-07-10 DIAGNOSIS — R9431 Abnormal electrocardiogram [ECG] [EKG]: Secondary | ICD-10-CM | POA: Insufficient documentation

## 2023-07-10 DIAGNOSIS — D72819 Decreased white blood cell count, unspecified: Secondary | ICD-10-CM | POA: Insufficient documentation

## 2023-07-10 LAB — COMPREHENSIVE METABOLIC PANEL WITH GFR
ALT: 33 U/L (ref 0–44)
AST: 132 U/L — ABNORMAL HIGH (ref 15–41)
Albumin: 2.1 g/dL — ABNORMAL LOW (ref 3.5–5.0)
Alkaline Phosphatase: 52 U/L (ref 38–126)
Anion gap: 9 (ref 5–15)
BUN: 14 mg/dL (ref 6–20)
CO2: 26 mmol/L (ref 22–32)
Calcium: 8.4 mg/dL — ABNORMAL LOW (ref 8.9–10.3)
Chloride: 101 mmol/L (ref 98–111)
Creatinine, Ser: 0.81 mg/dL (ref 0.44–1.00)
GFR, Estimated: >60 mL/min (ref >60)
Glucose, Bld: 79 mg/dL (ref 70–99)
Potassium: 3.9 mmol/L (ref 3.5–5.1)
Sodium: 136 mmol/L (ref 135–145)
Total Bilirubin: 0.5 mg/dL (ref 0.0–1.2)
Total Protein: 8.7 g/dL — ABNORMAL HIGH (ref 6.5–8.1)

## 2023-07-10 LAB — CBC
HCT: 36.2 % (ref 36.0–46.0)
Hemoglobin: 11.1 g/dL — ABNORMAL LOW (ref 12.0–15.0)
MCH: 27.5 pg (ref 26.0–34.0)
MCHC: 30.7 g/dL (ref 30.0–36.0)
MCV: 89.8 fL (ref 80.0–100.0)
Platelets: 267 10*3/uL (ref 150–400)
RBC: 4.03 MIL/uL (ref 3.87–5.11)
RDW: 17.2 % — ABNORMAL HIGH (ref 11.5–15.5)
WBC: 3 10*3/uL — ABNORMAL LOW (ref 4.0–10.5)
nRBC: 1.3 % — ABNORMAL HIGH (ref 0.0–0.2)

## 2023-07-10 LAB — MRSA NEXT GEN BY PCR, NASAL: MRSA by PCR Next Gen: NOT DETECTED

## 2023-07-10 LAB — TROPONIN I (HIGH SENSITIVITY)
Troponin I (High Sensitivity): 115 ng/L (ref <18)
Troponin I (High Sensitivity): 124 ng/L (ref <18)

## 2023-07-10 LAB — PHOSPHORUS: Phosphorus: 4 mg/dL (ref 2.5–4.6)

## 2023-07-10 LAB — MAGNESIUM: Magnesium: 1.6 mg/dL — ABNORMAL LOW (ref 1.7–2.4)

## 2023-07-10 LAB — HIV ANTIBODY (ROUTINE TESTING W REFLEX): HIV Screen 4th Generation wRfx: NONREACTIVE

## 2023-07-10 MED ORDER — ENSURE ENLIVE PO LIQD
237.0000 mL | Freq: Two times a day (BID) | ORAL | Status: DC
  Administered 2023-07-10 – 2023-07-16 (×6): 237 mL via ORAL

## 2023-07-10 MED ORDER — ACETAMINOPHEN 650 MG RE SUPP: 650.0000 mg | Freq: Four times a day (QID) | RECTAL | Status: DC | PRN

## 2023-07-10 MED ORDER — ENOXAPARIN SODIUM 40 MG/0.4ML IJ SOSY
40.0000 mg | PREFILLED_SYRINGE | INTRAMUSCULAR | Status: DC
  Administered 2023-07-10 – 2023-07-16 (×7): 40 mg via SUBCUTANEOUS
  Filled 2023-07-10 (×7): qty 0.4

## 2023-07-10 MED ORDER — FUROSEMIDE 10 MG/ML IJ SOLN
40.0000 mg | Freq: Two times a day (BID) | INTRAMUSCULAR | Status: DC
  Administered 2023-07-10 (×2): 40 mg via INTRAVENOUS
  Filled 2023-07-10 (×3): qty 4

## 2023-07-10 MED ORDER — ACETAMINOPHEN 325 MG PO TABS
650.0000 mg | ORAL_TABLET | Freq: Four times a day (QID) | ORAL | Status: DC | PRN
  Administered 2023-07-10 – 2023-07-12 (×5): 650 mg via ORAL
  Filled 2023-07-10 (×5): qty 2

## 2023-07-10 MED ORDER — IOHEXOL 350 MG/ML SOLN
80.0000 mL | Freq: Once | INTRAVENOUS | Status: DC | PRN
Start: 2023-07-10 — End: 2023-07-12

## 2023-07-10 MED ORDER — ASPIRIN 325 MG PO TBEC
325.0000 mg | DELAYED_RELEASE_TABLET | Freq: Every day | ORAL | Status: DC
  Administered 2023-07-10 – 2023-07-16 (×7): 325 mg via ORAL
  Filled 2023-07-10 (×8): qty 1

## 2023-07-10 MED ORDER — PROCHLORPERAZINE EDISYLATE 10 MG/2ML IJ SOLN: 10.0000 mg | Freq: Four times a day (QID) | INTRAMUSCULAR | Status: DC | PRN

## 2023-07-10 NOTE — Plan of Care (Signed)
   Problem: Education: Goal: Knowledge of General Education information will improve Description Including pain rating scale, medication(s)/side effects and non-pharmacologic comfort measures Outcome: Progressing

## 2023-07-10 NOTE — Progress Notes (Signed)
 Patients daughter [name] presenting with many concerns about mother and care being received. This nurse entered room when daughter presented concerns to me. Complete bed change performed, gown changed, mepilex changed to sacrum and placed on bilateral heels for protection. Patient placed on o2 and vitals obtained. BP within normal limits and patient now resting comfortably in bed. Nighttime attending notified of daughters concerns to be transferred to [facility]. Daughter also requesting to speak to daytime attending. House Supervisor made aware of daughters concerns and spoke with her as well.

## 2023-07-10 NOTE — Progress Notes (Signed)
 PROGRESS NOTE    [name]  OZH:[id number] DOB: 01-07-1966 DOA: 07/09/2023 PCP: [facility]   Brief Narrative:    [name] is a 58 y.o. female with medical history significant of hypertension, hyperlipidemia, acquired hypothyroidism, reportedly no longer on levothyroxine, CVA with residual left-sided weakness, tobacco use disorder who presents to the emergency department from home via EMS due to 2-week onset of increased shortness of breath with difficulty in being able to sleep at night, so she has been taking Benadryl with subsequent stomach upset.  Family thought that her shortness of breath was the cause of difficulty to sleep at night. Patient also endorsed increased weakness and have sustained 2 falls during transfer due to weakness.  Patient is bed bound -wheelchair bound at baseline.  Patient was admitted with acute CHF with noted elevated troponin levels and suspected PE with elevated D-dimer.  Assessment & Plan:   Principal Problem:   Acute CHF (congestive heart failure) (HCC) Active Problems:   Essential hypertension   CVA (cerebral vascular accident) (HCC)   Mixed hyperlipidemia   Acute respiratory failure with hypoxia (HCC)   Elevated brain natriuretic peptide (BNP) level   Elevated d-dimer   Elevated troponin   Leukopenia   Prolonged QT interval   Hypoalbuminemia due to protein-calorie malnutrition (HCC)   Elevated AST (SGOT)   Obesity, Class II, BMI 35-39.9  Assessment and Plan:   Acute CHF Elevated BNP Continue total input/output, daily weights and fluid restriction IV Lasix 40 mg x 1 was given in the ED, we will continue with IV Lasix 40 twice daily (with holding parameters) Continue heart healthy diet      Echocardiogram ordered and pending   Acute respiratory failure with hypoxia Continue supplemental oxygen to maintain O2 sats > 92% with plan to wean patient off this as tolerated.  Patient does not use supplemental  oxygen at baseline.   Elevated D-dimer rule out pulm embolus D-dimer > 20.0 IV contrast infiltrated, so, CTA scan pending   Elevated troponin possible secondary to type II demand ischemia Troponin 82 > 108, she denies chest pain, continue to trend troponin Continue telemetry   Leukopenia possibly due to methotrexate effect WBC 1.9, continue to monitor for CBC   Prolonged QT interval QTc 505 ms Avoid QT prolonging drugs Magnesium level will be checked Repeat EKG in the morning   Hypoalbuminemia possibly secondary to moderate protein calorie malnutrition Albumin 2.3; protein supplement will be provided   Elevated AST  AST 153.  Continue to monitor liver enzymes   Essential hypertension Continue Lasix as described above for CHF   CVA Continue aspirin per home regimen Statin temporarily held due to elevated AST   Mixed hyperlipidemia Statin will be held at this time due to elevated AST   Class II obesity (BMI 38.75) Patient was counseled about the cardiovascular and metabolic risk of morbid obesity. Patient was counseled for diet control, exercise regimen and weight loss.     DVT prophylaxis:Lovenox Code Status: Full Family Communication: Husband at bedside 4/7 Disposition Plan:  Status is: Inpatient Remains inpatient appropriate because: Need for IV medications.   Skin Assessment:  I have examined the patient's skin and I agree with the wound assessment as performed by the wound care RN as outlined below:  Pressure Injury 07/09/23 Coccyx Left Stage 2 -  Partial thickness loss of dermis presenting as a shallow open injury with a red, pink wound bed without slough. (Active)  07/09/23 2300  Location:  Coccyx  Location Orientation: Left  Staging: Stage 2 -  Partial thickness loss of dermis presenting as a shallow open injury with a red, pink wound bed without slough.  Wound Description (Comments):   Present on Admission: Yes  Dressing Type Foam - Lift dressing to  assess site every shift 07/09/23 2234    Consultants:  None  Procedures:  None  Antimicrobials:  None   Subjective: Patient seen and evaluated today with no new acute complaints or concerns. No acute concerns or events noted overnight.  Objective: Vitals:   07/10/23 0300 07/10/23 0400 07/10/23 0500 07/10/23 0607  BP: 101/74 112/83 (!) 92/57 (!) 91/58  Pulse:      Resp: (!) 26 18 (!) 29 (!) 35  Temp:      TempSrc:      SpO2:  99%  97%  Weight:      Height:        Intake/Output Summary (Last 24 hours) at 07/10/2023 0658 Last data filed at 07/10/2023 0500 Gross per 24 hour  Intake 400 ml  Output 2550 ml  Net -2150 ml   Filed Weights   07/09/23 1124  Weight: 108.9 kg    Examination:  General exam: Appears calm and comfortable  Respiratory system: Clear to auscultation. Respiratory effort normal. Cardiovascular system: S1 & S2 heard, RRR.  Gastrointestinal system: Abdomen is soft Central nervous system: Alert and awake Extremities: No edema Skin: No significant lesions noted Psychiatry: Flat affect.    Data Reviewed: I have personally reviewed following labs and imaging studies  CBC: Recent Labs  Lab 07/09/23 1320 07/10/23 0622  WBC 1.9* 3.0*  NEUTROABS 0.8*  --   HGB 11.2* 11.1*  HCT 35.9* 36.2  MCV 89.1 89.8  PLT 252 267   Basic Metabolic Panel: Recent Labs  Lab 07/09/23 1505  NA 135  K 3.7  CL 100  CO2 27  GLUCOSE 75  BUN 15  CREATININE 0.73  CALCIUM 8.5*   GFR: Estimated Creatinine Clearance: 96.9 mL/min (by C-G formula based on SCr of 0.73 mg/dL). Liver Function Tests: Recent Labs  Lab 07/09/23 1505  AST 153*  ALT 39  ALKPHOS 60  BILITOT 0.6  PROT 9.5*  ALBUMIN 2.3*   No results for input(s): "LIPASE", "AMYLASE" in the last 168 hours. No results for input(s): "AMMONIA" in the last 168 hours. Coagulation Profile: No results for input(s): "INR", "PROTIME" in the last 168 hours. Cardiac Enzymes: No results for input(s):  "CKTOTAL", "CKMB", "CKMBINDEX", "TROPONINI" in the last 168 hours. BNP (last 3 results) No results for input(s): "PROBNP" in the last 8760 hours. HbA1C: No results for input(s): "HGBA1C" in the last 72 hours. CBG: No results for input(s): "GLUCAP" in the last 168 hours. Lipid Profile: No results for input(s): "CHOL", "HDL", "LDLCALC", "TRIG", "CHOLHDL", "LDLDIRECT" in the last 72 hours. Thyroid Function Tests: No results for input(s): "TSH", "T4TOTAL", "FREET4", "T3FREE", "THYROIDAB" in the last 72 hours. Anemia Panel: No results for input(s): "VITAMINB12", "FOLATE", "FERRITIN", "TIBC", "IRON", "RETICCTPCT" in the last 72 hours. Sepsis Labs: Recent Labs  Lab 07/09/23 1320  LATICACIDVEN 1.8    Recent Results (from the past 240 hours)  Blood culture (routine x 2)     Status: None (Preliminary result)   Collection Time: 07/09/23 12:27 PM   Specimen: BLOOD  Result Value Ref Range Status   Specimen Description BLOOD BLOOD LEFT ARM  Final   Special Requests   Final    AEROBIC BOTTLE ONLY Blood Culture results may not  be optimal due to an inadequate volume of blood received in culture bottles Performed at [facility], [address] [place], [place] [address]    Culture PENDING  Incomplete   Report Status PENDING  Incomplete  Blood culture (routine x 2)     Status: None (Preliminary result)   Collection Time: 07/09/23 12:27 PM   Specimen: BLOOD  Result Value Ref Range Status   Specimen Description BLOOD BLOOD LEFT HAND  Final   Special Requests   Final    AEROBIC BOTTLE ONLY Blood Culture results may not be optimal due to an inadequate volume of blood received in culture bottles Performed at [facility], [address] [place], [place] [address]    Culture PENDING  Incomplete   Report Status PENDING  Incomplete  MRSA Next Gen by PCR, Nasal     Status: None   Collection Time: 07/09/23  8:34 PM   Specimen: Nasal Mucosa; Nasal Swab  Result Value Ref Range Status   MRSA by PCR  Next Gen NOT DETECTED NOT DETECTED Final    Comment: (NOTE) The GeneXpert MRSA Assay (FDA approved for NASAL specimens only), is one component of a comprehensive MRSA colonization surveillance program. It is not intended to diagnose MRSA infection nor to guide or monitor treatment for MRSA infections. Test performance is not FDA approved in patients less than 23 years old. Performed at [facility], [address] [place], [place] [address]          Radiology Studies: DG Chest Portable 1 View Result Date: 07/09/2023 CLINICAL DATA:  Shortness of breath. EXAM: PORTABLE CHEST 1 VIEW COMPARISON:  01/17/2022. FINDINGS: Heart is enlarged and the mediastinal contour is stable. Pulmonary vasculature is mildly distended. Lung volumes are low with mild atelectasis at the left lung base. No consolidation, effusion, or pneumothorax. No acute osseous abnormality. IMPRESSION: 1. Cardiomegaly with distended pulmonary vasculature. 2. Low lung volumes with left basilar atelectasis. Electronically Signed   By: Thornell Sartorius M.D.   On: 07/09/2023 12:56        Scheduled Meds:  aspirin EC  325 mg Oral Daily   Chlorhexidine Gluconate Cloth  6 each Topical Daily   enoxaparin (LOVENOX) injection  40 mg Subcutaneous Q24H   feeding supplement  237 mL Oral BID BM   furosemide  40 mg Intravenous Q12H     LOS: 1 day    Time spent: 55 minutes    Torres Hardenbrook Hoover Brunette, DO [facility]  If 7PM-7AM, please contact night-coverage [email or web address] 07/10/2023, 6:58 AM

## 2023-07-10 NOTE — TOC Initial Note (Signed)
 Transition of Care [facility]) - Initial/Assessment Note    Patient Details  Name: [name] MRN: [id number] Date of Birth: 28-Feb-1966  [facility] CM/SW Contact:    Elliot Gault, LCSW Phone Number: 07/10/2023, 12:35 PM  Clinical Narrative:                  Pt admitted from home. Met with pt at bedside to assess.  Per pt, she and her husband reside together. Pt does not ambulate. She has a 360 motorized scooter. She is able to stand and transfer to the device and to the bsc, shower, etc. Pt's husband assists with ADLs as needed.  Pt requesting new BSC and a free standing trapeze bar. CMS provider options reviewed and referred as requested. Zach at [employer] checking if pt's medicaid will cover both items.  Pt anticipating she will dc home at dc. She anticipates that she will be able to transport in private vehicle.   TOC will follow and assist with dc planning.   Expected Discharge Plan: Home/Self Care Barriers to Discharge: Continued Medical Work up   Patient Goals and CMS Choice Patient states their goals for this hospitalization and ongoing recovery are:: return home CMS Medicare.gov Compare Post Acute Care list provided to:: Patient Choice offered to / list presented to : Patient      Expected Discharge Plan and Services In-house Referral: Clinical Social Work   Post Acute Care Choice: Durable Medical Equipment Living arrangements for the past 2 months: Single Family Home                 DME Arranged: 3-N-1, [employer] DME Agency: [employer] Date DME Agency Contacted: 07/10/23   Representative spoke with at DME Agency: Ian Malkin            Prior Living Arrangements/Services Living arrangements for the past 2 months: Single Family Home Lives with:: Spouse Patient language and need for interpreter reviewed:: Yes Do you feel safe going back to the place where you live?: Yes      Need for Family Participation in Patient Care: Yes (Comment) Care giver support  system in place?: Yes (comment) Current home services: DME Criminal Activity/Legal Involvement Pertinent to Current Situation/Hospitalization: No - Comment as needed  Activities of Daily Living   ADL Screening (condition at time of admission) Independently performs ADLs?: No Does the patient have a NEW difficulty with bathing/dressing/toileting/self-feeding that is expected to last >3 days?: No Does the patient have a NEW difficulty with getting in/out of bed, walking, or climbing stairs that is expected to last >3 days?: No Does the patient have a NEW difficulty with communication that is expected to last >3 days?: No Is the patient deaf or have difficulty hearing?: No Does the patient have difficulty seeing, even when wearing glasses/contacts?: No Does the patient have difficulty concentrating, remembering, or making decisions?: No  Permission Sought/Granted                  Emotional Assessment Appearance:: Appears stated age Attitude/Demeanor/Rapport: Engaged Affect (typically observed): Pleasant Orientation: : Oriented to Self, Oriented to Place, Oriented to  Time, Oriented to Situation Alcohol / Substance Use: Not Applicable Psych Involvement: No (comment)  Admission diagnosis:  Orthopnea [R06.01] Shortness of breath [R06.02] Elevated troponin [R79.89] Acute CHF (congestive heart failure) (HCC) [I50.9] Elevated brain natriuretic peptide (BNP) level [R79.89] Acute respiratory failure with hypoxia (HCC) [J96.01] D-dimer, elevated [R79.89] Neutropenia, unspecified type (HCC) [D70.9] Patient Active Problem List   Diagnosis Date Noted  Elevated brain natriuretic peptide (BNP) level 07/10/2023   Elevated d-dimer 07/10/2023   Elevated troponin 07/10/2023   Leukopenia 07/10/2023   Prolonged QT interval 07/10/2023   Hypoalbuminemia due to protein-calorie malnutrition (HCC) 07/10/2023   Elevated AST (SGOT) 07/10/2023   Obesity, Class II, BMI 35-39.9 07/10/2023   Acute  CHF (congestive heart failure) (HCC) 07/09/2023   Acute respiratory failure with hypoxia (HCC) 01/15/2022   Hypokalemia 01/15/2022   Hyponatremia 01/15/2022   CAP (community acquired pneumonia) 01/14/2022   Sepsis (HCC) 01/14/2022   Pain in right knee 12/14/2021   Mild intermittent asthma without complication 07/07/2021   Normocytic anemia 12/02/2020   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/19/2020   High risk medication use 06/19/2020   Vitamin D deficiency 06/19/2020   Arthritis involving multiple sites 02/18/2018   Mixed hyperlipidemia 02/16/2018   Class 3 severe obesity due to excess calories with serious comorbidity and body mass index (BMI) of 45.0 to 49.9 in adult (HCC) 02/16/2018   Hemiparesis and alteration of sensations as late effects of stroke (HCC) 02/16/2018   Anogenital (venereal) warts 08/04/2017   Screening for colorectal cancer 08/04/2017   Encounter for gynecological examination with Papanicolaou smear of cervix 08/04/2017   Vertigo 08/04/2017   Vertigo as late effect of stroke 08/04/2017   Smoker 06/01/2017   Acquired hypothyroidism 05/12/2017   CVA (cerebral vascular accident) (HCC) 06/25/2013   Tremor of left hand 06/25/2013   Hemiparesis, left (HCC) 06/25/2013   History of CVA with residual deficit 06/25/2013   Difficulty walking 08/31/2011   Unstable balance 08/31/2011   Weakness of left side of body 08/31/2011   Difficulty walking 08/31/2011   Cerebral infarction (HCC) 04/07/2011   Left hand weakness 04/07/2011   Essential hypertension 04/07/2011   PCP:  [facility]:   [place] [address], [place] [address] [place] [address] [place] [address] [place] [address] Phone: [phone] Fax: [phone]  [place] [facility] [address] [place] [address] Phone: [phone] Fax: [phone]     Social Drivers of  Health (SDOH) Social History: SDOH Screenings   Food Insecurity: No Food Insecurity (07/09/2023)  Housing: Low Risk  (07/09/2023)  Transportation Needs: No Transportation Needs (07/09/2023)  Utilities: Not At Risk (07/09/2023)  Alcohol Screen: Low Risk  (08/31/2021)  Depression (PHQ2-9): Low Risk  (08/31/2021)  Financial Resource Strain: Low Risk  (04/07/2023)   [facility] from [facility]  Physical Activity: Sufficiently Active (08/31/2021)  Recent Concern: Physical Activity - Inactive (07/07/2021)   Received from [facility], [facility]  Social Connections: Unknown (07/08/2022)   Received from [facility], [facility]  Stress: No Stress Concern Present (08/31/2021)  Tobacco Use: High Risk (04/07/2023)   Received from [facility]   SDOH Interventions:     Readmission Risk Interventions     No data to display

## 2023-07-10 NOTE — Progress Notes (Signed)
 Pt requires a bed side commode for home use. Patient confined to one room.

## 2023-07-11 ENCOUNTER — Inpatient Hospital Stay (HOSPITAL_COMMUNITY)

## 2023-07-11 ENCOUNTER — Encounter (HOSPITAL_COMMUNITY): Payer: Self-pay | Admitting: Internal Medicine

## 2023-07-11 DIAGNOSIS — R0602 Shortness of breath: Principal | ICD-10-CM

## 2023-07-11 DIAGNOSIS — I5031 Acute diastolic (congestive) heart failure: Secondary | ICD-10-CM

## 2023-07-11 DIAGNOSIS — I509 Heart failure, unspecified: Secondary | ICD-10-CM | POA: Diagnosis not present

## 2023-07-11 DIAGNOSIS — L8990 Pressure ulcer of unspecified site, unspecified stage: Secondary | ICD-10-CM | POA: Insufficient documentation

## 2023-07-11 LAB — CBC
HCT: 34.3 % — ABNORMAL LOW (ref 36.0–46.0)
Hemoglobin: 10.6 g/dL — ABNORMAL LOW (ref 12.0–15.0)
MCH: 27.7 pg (ref 26.0–34.0)
MCHC: 30.9 g/dL (ref 30.0–36.0)
MCV: 89.6 fL (ref 80.0–100.0)
Platelets: 257 10*3/uL (ref 150–400)
RBC: 3.83 MIL/uL — ABNORMAL LOW (ref 3.87–5.11)
RDW: 17.1 % — ABNORMAL HIGH (ref 11.5–15.5)
WBC: 3.1 10*3/uL — ABNORMAL LOW (ref 4.0–10.5)
nRBC: 1.3 % — ABNORMAL HIGH (ref 0.0–0.2)

## 2023-07-11 LAB — BASIC METABOLIC PANEL WITH GFR
Anion gap: 6 (ref 5–15)
BUN: 17 mg/dL (ref 6–20)
CO2: 30 mmol/L (ref 22–32)
Calcium: 8.2 mg/dL — ABNORMAL LOW (ref 8.9–10.3)
Chloride: 99 mmol/L (ref 98–111)
Creatinine, Ser: 0.83 mg/dL (ref 0.44–1.00)
GFR, Estimated: >60 mL/min (ref >60)
Glucose, Bld: 82 mg/dL (ref 70–99)
Potassium: 3.9 mmol/L (ref 3.5–5.1)
Sodium: 135 mmol/L (ref 135–145)

## 2023-07-11 LAB — ECHOCARDIOGRAM COMPLETE
AR max vel: 2.51 cm2
AV Area VTI: 2.25 cm2
AV Area mean vel: 1.97 cm2
AV Mean grad: 4 mmHg
AV Peak grad: 7.6 mmHg
Ao pk vel: 1.38 m/s
Area-P 1/2: 5.38 cm2
Calc EF: 56.7 %
Height: 66 in
MV VTI: 2.93 cm2
S' Lateral: 2.6 cm
Single Plane A2C EF: 54.8 %
Single Plane A4C EF: 59.3 %
Weight: 3671.98 [oz_av]

## 2023-07-11 LAB — TROPONIN I (HIGH SENSITIVITY)
Troponin I (High Sensitivity): 147 ng/L (ref <18)
Troponin I (High Sensitivity): 146 ng/L (ref <18)

## 2023-07-11 LAB — MAGNESIUM: Magnesium: 1.4 mg/dL — ABNORMAL LOW (ref 1.7–2.4)

## 2023-07-11 LAB — LACTIC ACID, PLASMA
Lactic Acid, Venous: 1.4 mmol/L (ref 0.5–1.9)
Lactic Acid, Venous: 1.4 mmol/L (ref 0.5–1.9)

## 2023-07-11 MED ORDER — MAGNESIUM SULFATE 2 GM/50ML IV SOLN
2.0000 g | Freq: Once | INTRAVENOUS | Status: AC
  Administered 2023-07-11: 2 g via INTRAVENOUS
  Filled 2023-07-11: qty 50

## 2023-07-11 MED ORDER — PERFLUTREN LIPID MICROSPHERE
1.0000 mL | INTRAVENOUS | Status: AC | PRN
Start: 2023-07-11 — End: 2023-07-11
  Administered 2023-07-11: 3 mL via INTRAVENOUS

## 2023-07-11 MED ORDER — NICOTINE 7 MG/24HR TD PT24
7.0000 mg | MEDICATED_PATCH | Freq: Every day | TRANSDERMAL | Status: DC
  Administered 2023-07-12: 7 mg via TRANSDERMAL
  Filled 2023-07-11 (×6): qty 1

## 2023-07-11 MED ORDER — POLYETHYLENE GLYCOL 3350 17 G PO PACK
17.0000 g | PACK | Freq: Every day | ORAL | Status: DC
  Administered 2023-07-12 – 2023-07-16 (×4): 17 g via ORAL
  Filled 2023-07-11 (×5): qty 1

## 2023-07-11 NOTE — Progress Notes (Signed)
 Date and time results received: 07/11/23 0918  (use smartphrase ".now" to insert current time)  Test: triponin Critical Value: 1.47  Name of Provider Notified:  Dr. Sherryll Burger  Orders Received? Or Actions Taken?:  Awaiting new orders

## 2023-07-11 NOTE — Progress Notes (Addendum)
 PROGRESS NOTE    [name]  UUV:[id number] DOB: 1965/05/09 DOA: 07/09/2023 PCP: [facility]   Brief Narrative:    [name] is a 58 y.o. female with medical history significant of hypertension, hyperlipidemia, acquired hypothyroidism, reportedly no longer on levothyroxine, CVA with residual left-sided weakness, tobacco use disorder who presents to the emergency department from home via EMS due to 2-week onset of increased shortness of breath with difficulty in being able to sleep at night, so she has been taking Benadryl with subsequent stomach upset.  Family thought that her shortness of breath was the cause of difficulty to sleep at night. Patient also endorsed increased weakness and have sustained 2 falls during transfer due to weakness.  Patient is bed bound -wheelchair bound at baseline.  Patient was admitted with acute CHF with noted elevated troponin levels and suspected PE with elevated D-dimer.  CT angiogram does not demonstrate PE.  She continues to have symptoms of heart failure requiring IV diuresis and cardiology asked to consult to assist with management.  2D echocardiogram with LVEF 50-55% and signs of RV failure.  Assessment & Plan:   Principal Problem:   Acute CHF (congestive heart failure) (HCC) Active Problems:   Essential hypertension   CVA (cerebral vascular accident) (HCC)   Mixed hyperlipidemia   Acute respiratory failure with hypoxia (HCC)   Elevated brain natriuretic peptide (BNP) level   Elevated d-dimer   Elevated troponin   Leukopenia   Prolonged QT interval   Hypoalbuminemia due to protein-calorie malnutrition (HCC)   Elevated AST (SGOT)   Obesity, Class II, BMI 35-39.9  Assessment and Plan:   Acute diastolic CHF with concern for RV failure Elevated BNP Continue total input/output, daily weights and fluid restriction Hold Lasix for now given hypotension Continue heart healthy diet      Echocardiogram with  preserved LVEF and signs of elevated pulmonary hypertension noted and RV failure. Appreciate further cardiology recommendations   Acute respiratory failure with hypoxia Continue supplemental oxygen to maintain O2 sats > 92% with plan to wean patient off this as tolerated.  Patient does not use supplemental oxygen at baseline.   Elevated D-dimer rule out pulm embolus D-dimer > 20.0 CT angiogram without any signs of PE  Persistent hypotension Hold IV Lasix for now and monitor in stepdown unit May require pressors   Elevated troponin possible secondary to type II demand ischemia Troponin with flat trend and 2D echocardiogram with no signs of wall motion abnormalities Continue telemetry   Leukopenia possibly due to methotrexate effect Continue to monitor CBC   Hypoalbuminemia possibly secondary to moderate protein calorie malnutrition Albumin 2.3; protein supplement will be provided   Elevated AST  Continue to monitor liver enzymes in a.m.   Essential hypertension Continue Lasix as described above for CHF   CVA Continue aspirin per home regimen Statin temporarily held due to elevated AST   Mixed hyperlipidemia Statin will be held at this time due to elevated AST   Class II obesity (BMI 38.75) Patient was counseled about the cardiovascular and metabolic risk of morbid obesity. Patient was counseled for diet control, exercise regimen and weight loss.     DVT prophylaxis:Lovenox Code Status: Full Family Communication: Husband at bedside 4/7, discussed with daughter on phone 4/8 Disposition Plan:  Status is: Inpatient Remains inpatient appropriate because: Need for IV medications.   Skin Assessment:  I have examined the patient's skin and I agree with the wound assessment as performed by the wound  care RN as outlined below:  Pressure Injury 07/09/23 Coccyx Left Stage 2 -  Partial thickness loss of dermis presenting as a shallow open injury with a red, pink wound bed  without slough. (Active)  07/09/23 2300  Location: Coccyx  Location Orientation: Left  Staging: Stage 2 -  Partial thickness loss of dermis presenting as a shallow open injury with a red, pink wound bed without slough.  Wound Description (Comments):   Present on Admission: Yes  Dressing Type Foam - Lift dressing to assess site every shift 07/11/23 0900    Consultants:  Cardiology  Procedures:  2D echocardiogram 4/8  Antimicrobials:  None   Subjective: Patient seen and evaluated today and she states that she is feeling well.  Unfortunately blood pressures remain quite low and she appears mildly short of breath.  Will need transfer back to stepdown unit for closer monitoring.  Objective: Vitals:   07/11/23 1200 07/11/23 1202 07/11/23 1300 07/11/23 1336  BP:  (!) 104/91 (!) 96/59   Pulse: 94 97 95 97  Resp: (!) 21 20 (!) 36 (!) 30  Temp: 98.5 F (36.9 C)     TempSrc: Oral     SpO2: 99% 99% 100% 100%  Weight:      Height:        Intake/Output Summary (Last 24 hours) at 07/11/2023 1416 Last data filed at 07/11/2023 0900 Gross per 24 hour  Intake 240 ml  Output 1200 ml  Net -960 ml   Filed Weights   07/09/23 1124 07/10/23 0724 07/11/23 0500  Weight: 108.9 kg 103.5 kg 104.1 kg    Examination:  General exam: Appears calm and comfortable  Respiratory system: Clear to auscultation. Respiratory effort normal.  Nasal cannula oxygen Cardiovascular system: S1 & S2 heard, RRR.  Gastrointestinal system: Abdomen is soft Central nervous system: Alert and awake Extremities: No edema Skin: No significant lesions noted Psychiatry: Flat affect.    Data Reviewed: I have personally reviewed following labs and imaging studies  CBC: Recent Labs  Lab 07/09/23 1320 07/10/23 0622 07/11/23 0437  WBC 1.9* 3.0* 3.1*  NEUTROABS 0.8*  --   --   HGB 11.2* 11.1* 10.6*  HCT 35.9* 36.2 34.3*  MCV 89.1 89.8 89.6  PLT 252 267 257   Basic Metabolic Panel: Recent Labs  Lab  07/09/23 1505 07/10/23 0622 07/11/23 0437  NA 135 136 135  K 3.7 3.9 3.9  CL 100 101 99  CO2 27 26 30   GLUCOSE 75 79 82  BUN 15 14 17   CREATININE 0.73 0.81 0.83  CALCIUM 8.5* 8.4* 8.2*  MG  --  1.6* 1.4*  PHOS  --  4.0  --    GFR: Estimated Creatinine Clearance: 91.1 mL/min (by C-G formula based on SCr of 0.83 mg/dL). Liver Function Tests: Recent Labs  Lab 07/09/23 1505 07/10/23 0622  AST 153* 132*  ALT 39 33  ALKPHOS 60 52  BILITOT 0.6 0.5  PROT 9.5* 8.7*  ALBUMIN 2.3* 2.1*   No results for input(s): "LIPASE", "AMYLASE" in the last 168 hours. No results for input(s): "AMMONIA" in the last 168 hours. Coagulation Profile: No results for input(s): "INR", "PROTIME" in the last 168 hours. Cardiac Enzymes: No results for input(s): "CKTOTAL", "CKMB", "CKMBINDEX", "TROPONINI" in the last 168 hours. BNP (last 3 results) No results for input(s): "PROBNP" in the last 8760 hours. HbA1C: No results for input(s): "HGBA1C" in the last 72 hours. CBG: No results for input(s): "GLUCAP" in the last 168 hours. Lipid  Profile: No results for input(s): "CHOL", "HDL", "LDLCALC", "TRIG", "CHOLHDL", "LDLDIRECT" in the last 72 hours. Thyroid Function Tests: No results for input(s): "TSH", "T4TOTAL", "FREET4", "T3FREE", "THYROIDAB" in the last 72 hours. Anemia Panel: No results for input(s): "VITAMINB12", "FOLATE", "FERRITIN", "TIBC", "IRON", "RETICCTPCT" in the last 72 hours. Sepsis Labs: Recent Labs  Lab 07/09/23 1320 07/11/23 0928 07/11/23 1209  LATICACIDVEN 1.8 1.4 1.4    Recent Results (from the past 240 hours)  Blood culture (routine x 2)     Status: None (Preliminary result)   Collection Time: 07/09/23 12:27 PM   Specimen: BLOOD RIGHT ARM  Result Value Ref Range Status   Specimen Description BLOOD RIGHT ARM AEROBIC BOTTLE ONLY  Final   Special Requests Blood Culture adequate volume  Final   Culture   Final    NO GROWTH 2 DAYS Performed at [facility], [address], [place], [place] [address]    Report Status PENDING  Incomplete  Blood culture (routine x 2)     Status: None (Preliminary result)   Collection Time: 07/09/23 12:27 PM   Specimen: BLOOD  Result Value Ref Range Status   Specimen Description BLOOD BLOOD LEFT HAND  Final   Special Requests   Final    AEROBIC BOTTLE ONLY Blood Culture results may not be optimal due to an inadequate volume of blood received in culture bottles   Culture   Final    NO GROWTH 2 DAYS Performed at [facility], [address], [place], [place] [address]    Report Status PENDING  Incomplete  MRSA Next Gen by PCR, Nasal     Status: None   Collection Time: 07/09/23  8:34 PM   Specimen: Nasal Mucosa; Nasal Swab  Result Value Ref Range Status   MRSA by PCR Next Gen NOT DETECTED NOT DETECTED Final    Comment: (NOTE) The GeneXpert MRSA Assay (FDA approved for NASAL specimens only), is one component of a comprehensive MRSA colonization surveillance program. It is not intended to diagnose MRSA infection nor to guide or monitor treatment for MRSA infections. Test performance is not FDA approved in patients less than 20 years old. Performed at [facility], [address], [place], [place] [address]          Radiology Studies: CT Angio Chest PE W/Cm &/Or Wo Cm Result Date: 07/10/2023 CLINICAL DATA:  Increasing shortness of breath for the past 2 weeks. Elevated D-dimer. EXAM: CT ANGIOGRAPHY CHEST WITH CONTRAST TECHNIQUE: Multidetector CT imaging of the chest was performed using the standard protocol during bolus administration of intravenous contrast. Multiplanar CT image reconstructions and MIPs were obtained to evaluate the vascular anatomy. RADIATION DOSE REDUCTION: This exam was performed according to the departmental dose-optimization program which includes automated exposure control, adjustment of the mA and/or kV according to patient size and/or use of iterative reconstruction technique. CONTRAST:  75mL OMNIPAQUE  IOHEXOL 350 MG/ML SOLN COMPARISON:  01/14/2022 FINDINGS: Cardiovascular: Normally opacified pulmonary arteries with no pulmonary arterial filling defects seen. Enlarged heart. No pericardial effusion. Minimal atheromatous aortic and coronary artery calcification. Mildly enlarged central pulmonary arteries with a main pulmonary artery diameter of 3.5 cm. Mediastinum/Nodes: No enlarged mediastinal, hilar, or axillary lymph nodes. Thyroid gland, trachea, and esophagus demonstrate no significant findings. Lungs/Pleura: Minimal left pleural effusion with improvement. Mild left lower lobe and minimal right lower lobe atelectasis with improvement. Resolved right pleural fluid. Upper Abdomen: Partially included 1.3 cm upper pole left renal low-density mass or cyst. Musculoskeletal: Thoracic and lower cervical  spine degenerative changes. Review of the MIP images confirms the above findings. IMPRESSION: 1. No pulmonary emboli. 2. Minimal left pleural effusion with improvement. 3. Cardiomegaly. 4. Mildly enlarged central pulmonary arteries, suggesting pulmonary arterial hypertension. 5. Partially included 1.3 cm upper pole left renal low-density mass or cyst. Recommend abdomen and pelvis CT with contrast for further evaluation. Ultrasound would most likely not be beneficial due to body habitus. 6. Minimal calcific coronary artery and aortic atherosclerosis. Electronically Signed   By: Beckie Salts M.D.   On: 07/10/2023 16:44        Scheduled Meds:  aspirin EC  325 mg Oral Daily   Chlorhexidine Gluconate Cloth  6 each Topical Daily   enoxaparin (LOVENOX) injection  40 mg Subcutaneous Q24H   feeding supplement  237 mL Oral BID BM     LOS: 2 days    Time spent: 55 minutes    Nira Visscher Hoover Brunette, DO [facility]  If 7PM-7AM, please contact night-coverage [email or web address] 07/11/2023, 2:16 PM

## 2023-07-11 NOTE — TOC Progression Note (Signed)
 Transition of Care [facility]) - Progression Note    Patient Details  Name: [name] MRN: [id number] Date of Birth: 1965/12/25  [facility] of [facility]) CM/SW Contact  Villa Herb, [place] Phone Number: 07/11/2023, 10:55 AM  Clinical Narrative:    CSW spoke to [place] with [employer] who states all of the ordered DME was delivered to pts home this morning. TOC to follow.   Expected Discharge Plan: Home/Self Care Barriers to Discharge: Continued Medical Work up  Expected Discharge Plan and Services In-house Referral: Clinical Social Work   Post Acute Care Choice: Durable Medical Equipment Living arrangements for the past 2 months: Single Family Home                 DME Arranged: 3-N-1, [place] DME Agency: [employer] Date DME Agency Contacted: 07/10/23   Representative spoke with at DME Agency: Ian Malkin             Social Determinants of Health (SDOH) Interventions SDOH Screenings   Food Insecurity: No Food Insecurity (07/09/2023)  Housing: Low Risk  (07/09/2023)  Transportation Needs: No Transportation Needs (07/09/2023)  Utilities: Not At Risk (07/09/2023)  Alcohol Screen: Low Risk  (08/31/2021)  Depression (PHQ2-9): Low Risk  (08/31/2021)  Financial Resource Strain: Low Risk  (04/07/2023)   Received from [facility]  Physical Activity: Sufficiently Active (08/31/2021)  Recent Concern: Physical Activity - Inactive (07/07/2021)   Received from [facility], [facility]  Social Connections: Unknown (07/08/2022)   Received from [facility], [facility]  Stress: No Stress Concern Present (08/31/2021)  Tobacco Use: High Risk (04/07/2023)   Received from [facility]    Readmission Risk Interventions     No data to display

## 2023-07-11 NOTE — Plan of Care (Signed)
   Problem: Education: Goal: Knowledge of General Education information will improve Description Including pain rating scale, medication(s)/side effects and non-pharmacologic comfort measures Outcome: Progressing   Problem: Health Behavior/Discharge Planning: Goal: Ability to manage health-related needs will improve Outcome: Progressing

## 2023-07-11 NOTE — Progress Notes (Signed)
   07/11/23 0800  Assess: MEWS Score  Temp (!) 100.6 F (38.1 C)  BP (!) 91/53  MAP (mmHg) 70  Pulse Rate (!) 114  Resp 20  SpO2 100 %  O2 Device Room Air  Assess: MEWS Score  MEWS Temp 1  MEWS Systolic 1  MEWS Pulse 2  MEWS RR 0  MEWS LOC 0  MEWS Score 4  MEWS Score Color Red  Assess: if the MEWS score is Yellow or Red  Were vital signs accurate and taken at a resting state? Yes  Does the patient meet 2 or more of the SIRS criteria? No  MEWS guidelines implemented  Yes, red  Treat  MEWS Interventions Considered administering scheduled or prn medications/treatments as ordered  Take Vital Signs  Increase Vital Sign Frequency  Red: Q1hr x2, continue Q4hrs until patient remains green for 12hrs  Escalate  MEWS: Escalate Red: Discuss with charge nurse and notify provider. Consider notifying RRT. If remains red for 2 hours consider need for higher level of care  Notify: Charge Nurse/RN  Name of Charge Nurse/RN Notified [place], RN  Provider Notification  Provider Name/Title Dr. Sherryll Burger  Assess: SIRS CRITERIA  SIRS Temperature  0  SIRS Respirations  0  SIRS Pulse 1  SIRS WBC 1  SIRS Score Sum  2

## 2023-07-11 NOTE — Progress Notes (Signed)
 Patients BP noted to be soft, rechecked it and BP still a little soft notified Dr. Sherryll Burger , new orders to not give lasix this morning.   07/11/23 0700  Assess: MEWS Score  BP (!) 91/53  Pulse Rate (!) 105  Level of Consciousness Alert  Assess: MEWS Score  MEWS Temp 0  MEWS Systolic 1  MEWS Pulse 1  MEWS RR 1  MEWS LOC 0  MEWS Score 3  MEWS Score Color Yellow  Assess: if the MEWS score is Yellow or Red  Were vital signs accurate and taken at a resting state? Yes  Does the patient meet 2 or more of the SIRS criteria? No  MEWS guidelines implemented  Yes, yellow  Treat  MEWS Interventions Considered administering scheduled or prn medications/treatments as ordered  Take Vital Signs  Increase Vital Sign Frequency  Yellow: Q2hr x1, continue Q4hrs until patient remains green for 12hrs  Escalate  MEWS: Escalate Yellow: Discuss with charge nurse and consider notifying provider and/or RRT  Notify: Charge Nurse/RN  Name of Charge Nurse/RN Notified [place], RN  Provider Notification  Provider Name/Title Dr. Sherryll Burger  Date Provider Notified 07/11/23  Time Provider Notified [phone]  Method of Notification Page  Notification Reason Other (Comment)  Provider response See new orders  Date of Provider Response 07/11/23  Time of Provider Response 0808  Assess: SIRS CRITERIA  SIRS Temperature  0  SIRS Respirations  1  SIRS Pulse 1  SIRS WBC 1  SIRS Score Sum  3

## 2023-07-11 NOTE — Progress Notes (Signed)
  Echocardiogram 2D Echocardiogram has been performed.  Ocie Doyne RDCS 07/11/2023, 9:29 AM

## 2023-07-11 NOTE — Consult Note (Signed)
 Cardiology Consultation   Patient ID: [name] MRN: [id number]; DOB: February 04, 1966  Admit date: 07/09/2023 Date of Consult: 07/11/2023  PCP:  [facility]   [facility] Providers Cardiologist:  New   Patient Profile:   [name] is a 58 y.o. female with a hx of CVA who is being seen 07/11/2023 for the evaluation of heart failure  at the request of  Dr Sherryll Burger  History of Present Illness:   [name] is a 58 yo with hx of HTN, HL, hypothyroidism, CVA.  She was admitted to [facility] with a 2 wk hx of increased SOB, weakness   She fell at home twice   Also having hard time laying down due to SOB In ER O2 sat 87% RA   Top 82, 108   D Dimer elevated at >20    Chest CT with cardiomegaly and distended pulmonary vasculature    NO PE     The pt denies CP ever  She denies SOB now except when laying flat    NO palpitations       Past Medical History:  Diagnosis Date   High cholesterol    Hypertension    Hypothyroidism    Stroke (HCC)    weakness on left side.   Vaginal Pap smear, abnormal     Past Surgical History:  Procedure Laterality Date   DILATION AND CURETTAGE OF UTERUS     LASER ABLATION CONDOLAMATA Bilateral 09/27/2017   Procedure: LASER ABLATION CONDYLOMA AND REMOVAL OF PERIANAL WART;  Surgeon: [name], MD;  Location: [facility];  Service: Gynecology;  Laterality: Bilateral;   MASS EXCISION Left 09/27/2017   Procedure: EXCISION LESION LEFT THIGH;  Surgeon: [name], MD;  Location: [facility];  Service: Gynecology;  Laterality: Left;     Home Medications:  Prior to Admission medications   Medication Sig Start Date End Date Taking? Authorizing Provider  acetaminophen (TYLENOL) 500 MG tablet Take 1,000 mg by mouth every 6 (six) hours as needed for mild pain.   Yes [provider]  amLODipine (NORVASC) 10 MG tablet Take 10 mg by mouth daily.   Yes [provider]  aspirin EC 325 MG tablet Take 1 tablet (325 mg total)  by mouth daily. 08/24/17  Yes Hagler, Fleet Contras, MD  atorvastatin (LIPITOR) 80 MG tablet Take 80 mg by mouth daily.   Yes [provider]  HUMIRA, 2 PEN, 40 MG/0.4ML pen INJECT 40 MG INTO THE SKIN EVERY 14 DAYS 06/14/23  Yes Rice, Jamesetta Orleans, MD  hydrALAZINE (APRESOLINE) 50 MG tablet Take 1 tablet (50 mg total) by mouth every 8 (eight) hours. 01/18/22 07/09/23 Yes Shahmehdi, Gemma Payor, MD  methotrexate (RHEUMATREX) 2.5 MG tablet Take 6 tablets (15 mg total) by mouth once a week. Caution:Chemotherapy. Protect from light. 02/22/23  Yes Rice, Jamesetta Orleans, MD  traZODone (DESYREL) 100 MG tablet Take 100 mg by mouth at bedtime. 05/09/22  Yes [provider]  Vitamin D, Ergocalciferol, (DRISDOL) 1.25 MG (50000 UNIT) CAPS capsule Take 50,000 Units by mouth once a week. 01/06/23  Yes [provider]    Inpatient Medications: Scheduled Meds:  aspirin EC  325 mg Oral Daily   Chlorhexidine Gluconate Cloth  6 each Topical Daily   enoxaparin (LOVENOX) injection  40 mg Subcutaneous Q24H   feeding supplement  237 mL Oral BID BM   Continuous Infusions:  PRN Meds: acetaminophen **OR** acetaminophen, iohexol, melatonin, prochlorperazine  Allergies:  Allergies  Allergen Reactions   Lisinopril Nausea Only    Social History:   Social History   Socioeconomic History   Marital status: Married    Spouse name: Not on file   Number of children: Not on file   Years of education: Not on file   Highest education level: Not on file  Occupational History   Not on file  Tobacco Use   Smoking status: Every Day    Current packs/day: 0.50    Average packs/day: 0.5 packs/day for 32.0 years (16.0 ttl pk-yrs)    Types: Cigarettes   Smokeless tobacco: Never  Vaping Use   Vaping status: Never Used  Substance and Sexual Activity   Alcohol use: No   Drug use: Yes    Types: Marijuana    Comment: occ   Sexual activity: Not Currently    Birth control/protection: Post-menopausal  Other  Topics Concern   Not on file  Social History Narrative   Grew up in [place], [place].   Engaged to be married.   5 children.    Eats mainly meat/junk food.   Social Drivers of Corporate investment banker Strain: Low Risk  (04/07/2023)   Received from [facility]   Overall Financial Resource Strain (CARDIA)    Difficulty of Paying Living Expenses: Not hard at all  Food Insecurity: No Food Insecurity (07/09/2023)   Hunger Vital Sign    Worried About Running Out of Food in the Last Year: Never true    Ran Out of Food in the Last Year: Never true  Transportation Needs: No Transportation Needs (07/09/2023)   PRAPARE - Administrator, Civil Service (Medical): No    Lack of Transportation (Non-Medical): No  Physical Activity: Sufficiently Active (08/31/2021)   Exercise Vital Sign    Days of Exercise per Week: 3 days    Minutes of Exercise per Session: 60 min  Recent Concern: Physical Activity - Inactive (07/07/2021)   Received from [facility]   Exercise Vital Sign    Days of Exercise per Week: 0 days    Minutes of Exercise per Session: 0 min  Stress: No Stress Concern Present (08/31/2021)   [employer] of Occupational Health - Occupational Stress Questionnaire    Feeling of Stress : Not at all  Social Connections: Unknown (07/08/2022)   Received from [facility]   Social Network    Social Network: Not on file  Intimate Partner Violence: Not At Risk (07/09/2023)   Humiliation, Afraid, Rape, and Kick questionnaire    Fear of Current or Ex-Partner: No    Emotionally Abused: No    Physically Abused: No    Sexually Abused: No    Family History:    Family History  Problem Relation Age of Onset   Hypertension Mother    Liver disease Mother    Heart attack Father    HIV Sister    Arrhythmia Sister    Mood Disorder Sister    Arrhythmia Brother    CAD Brother      ROS:  Please see the history of present illness.   All other ROS  reviewed and negative.     Physical Exam/Data:   Vitals:   07/11/23 0533 07/11/23 0700 07/11/23 0800 07/11/23 0940  BP: (!) 88/61 (!) 91/53 (!) 91/53 113/88  Pulse: (!) 105 (!) 105 (!) 114 (!) 106  Resp: (!) 22  20 18   Temp: 99.9 F (37.7 C)  (!) 100.6  F (38.1 C) 99.6 F (37.6 C)  TempSrc: Oral  Oral   SpO2: 100% 100% 100% 98%  Weight:      Height:        Intake/Output Summary (Last 24 hours) at 07/11/2023 0952 Last data filed at 07/11/2023 0900 Gross per 24 hour  Intake 240 ml  Output 1200 ml  Net -960 ml      07/11/2023    5:00 AM 07/10/2023    7:24 AM 07/09/2023   11:24 AM  Last 3 Weights  Weight (lbs) 229 lb 8 oz 228 lb 2.8 oz 240 lb 1.3 oz  Weight (kg) 104.1 kg 103.5 kg 108.9 kg     Body mass index is 37.04 kg/m.  General: Obese 58 yo in no acute distress HEENT: normal Neck: no JVD  Neck is full  Cardiac:  normal S1, S2; RRR; no murmurs Lungs:  clear to auscultation bilaterally, No rales  Abd: soft, nontender, no hepatomegaly  Ext: no LE  edema Skin: warm and dry  Neuro:  Unable to move L side    EKG:  The EKG was personally reviewed and demonstrates:  Sinus tachycardia   101 bpm   Sl ST depression,T wave inversion I, AVL  (new) Telemetry:  Telemetry was personally reviewed and demonstrates:  SR /ST   Relevant CV Studies: Echo 07/11/23   1. Septal flattening consistent with RV volume/ pressure overload. Left  ventricular ejection fraction, by estimation, is 50 to 55%. The left  ventricle has low normal function.   2. Right ventricular systolic function moderate to severely reduced. The  right ventricular size is moderately enlarged. There is moderately  elevated pulmonary artery systolic pressure.   3. Right atrial size was mild to moderately dilated.   4. The mitral valve is normal in structure. Trivial mitral valve  regurgitation.   5. The aortic valve is tricuspid. Aortic valve regurgitation is not  visualized.   Laboratory Data:  High Sensitivity  Troponin:   Recent Labs  Lab 07/09/23 1505 07/09/23 2058 07/10/23 0108 07/10/23 0622 07/11/23 0437  TROPONINIHS 82* 108* 115* 124* 147*     Chemistry Recent Labs  Lab 07/09/23 1505 07/10/23 0622 07/11/23 0437  NA 135 136 135  K 3.7 3.9 3.9  CL 100 101 99  CO2 27 26 30   GLUCOSE 75 79 82  BUN 15 14 17   CREATININE 0.73 0.81 0.83  CALCIUM 8.5* 8.4* 8.2*  MG  --  1.6* 1.4*  GFRNONAA >60 >60 >60  ANIONGAP 8 9 6     Recent Labs  Lab 07/09/23 1505 07/10/23 0622  PROT 9.5* 8.7*  ALBUMIN 2.3* 2.1*  AST 153* 132*  ALT 39 33  ALKPHOS 60 52  BILITOT 0.6 0.5   Lipids No results for input(s): "CHOL", "TRIG", "HDL", "LABVLDL", "LDLCALC", "CHOLHDL" in the last 168 hours.  Hematology Recent Labs  Lab 07/09/23 1320 07/10/23 0622 07/11/23 0437  WBC 1.9* 3.0* 3.1*  RBC 4.03 4.03 3.83*  HGB 11.2* 11.1* 10.6*  HCT 35.9* 36.2 34.3*  MCV 89.1 89.8 89.6  MCH 27.8 27.5 27.7  MCHC 31.2 30.7 30.9  RDW 17.2* 17.2* 17.1*  PLT 252 267 257   Thyroid No results for input(s): "TSH", "FREET4" in the last 168 hours.  BNP Recent Labs  Lab 07/09/23 1320  BNP 566.0*    DDimer  Recent Labs  Lab 07/09/23 1505  DDIMER >20.00*     Radiology/Studies:  CT Angio Chest PE W/Cm &/Or Wo Cm Result Date: 07/10/2023  CLINICAL DATA:  Increasing shortness of breath for the past 2 weeks. Elevated D-dimer. EXAM: CT ANGIOGRAPHY CHEST WITH CONTRAST TECHNIQUE: Multidetector CT imaging of the chest was performed using the standard protocol during bolus administration of intravenous contrast. Multiplanar CT image reconstructions and MIPs were obtained to evaluate the vascular anatomy. RADIATION DOSE REDUCTION: This exam was performed according to the departmental dose-optimization program which includes automated exposure control, adjustment of the mA and/or kV according to patient size and/or use of iterative reconstruction technique. CONTRAST:  75mL OMNIPAQUE IOHEXOL 350 MG/ML SOLN COMPARISON:  01/14/2022  FINDINGS: Cardiovascular: Normally opacified pulmonary arteries with no pulmonary arterial filling defects seen. Enlarged heart. No pericardial effusion. Minimal atheromatous aortic and coronary artery calcification. Mildly enlarged central pulmonary arteries with a main pulmonary artery diameter of 3.5 cm. Mediastinum/Nodes: No enlarged mediastinal, hilar, or axillary lymph nodes. Thyroid gland, trachea, and esophagus demonstrate no significant findings. Lungs/Pleura: Minimal left pleural effusion with improvement. Mild left lower lobe and minimal right lower lobe atelectasis with improvement. Resolved right pleural fluid. Upper Abdomen: Partially included 1.3 cm upper pole left renal low-density mass or cyst. Musculoskeletal: Thoracic and lower cervical spine degenerative changes. Review of the MIP images confirms the above findings. IMPRESSION: 1. No pulmonary emboli. 2. Minimal left pleural effusion with improvement. 3. Cardiomegaly. 4. Mildly enlarged central pulmonary arteries, suggesting pulmonary arterial hypertension. 5. Partially included 1.3 cm upper pole left renal low-density mass or cyst. Recommend abdomen and pelvis CT with contrast for further evaluation. Ultrasound would most likely not be beneficial due to body habitus. 6. Minimal calcific coronary artery and aortic atherosclerosis. Electronically Signed   By: Beckie Salts M.D.   On: 07/10/2023 16:44   DG Chest Portable 1 View Result Date: 07/09/2023 CLINICAL DATA:  Shortness of breath. EXAM: PORTABLE CHEST 1 VIEW COMPARISON:  01/17/2022. FINDINGS: Heart is enlarged and the mediastinal contour is stable. Pulmonary vasculature is mildly distended. Lung volumes are low with mild atelectasis at the left lung base. No consolidation, effusion, or pneumothorax. No acute osseous abnormality. IMPRESSION: 1. Cardiomegaly with distended pulmonary vasculature. 2. Low lung volumes with left basilar atelectasis. Electronically Signed   By: Thornell Sartorius M.D.    On: 07/09/2023 12:56     Assessment and Plan:   1  CHF    Pt presented with volume overload   Has diuresed with IV lasix    Echo today shows LVEF low normal   RVEF is severely depressed however   Septal flattening consistent with RV fvolume / pressure overload  Est PAP 50 mm HG    Note CT scan was negative for PE but PA was enlarged, sugg also pulmonary HTN    Etiology for changes unclear     I would recomm R heart cath to confirm pressures.  THis would be done at [facility].    Will work on Risk manager lasix for hypotension.    May need some fluid  2  Elevated D Dimer   Greater than 20     No evid of acute PE     3  Hx of HTN   Current BP is low       4  Hx CVA      For questions or updates, please contact [facility] Please consult [email or web address] for contact info under    Signed, Dietrich Pates, MD  07/11/2023 9:52 AM

## 2023-07-12 ENCOUNTER — Encounter (HOSPITAL_COMMUNITY): Payer: Self-pay | Admitting: Internal Medicine

## 2023-07-12 ENCOUNTER — Inpatient Hospital Stay (HOSPITAL_COMMUNITY)

## 2023-07-12 DIAGNOSIS — R7989 Other specified abnormal findings of blood chemistry: Secondary | ICD-10-CM | POA: Diagnosis not present

## 2023-07-12 DIAGNOSIS — I5031 Acute diastolic (congestive) heart failure: Secondary | ICD-10-CM | POA: Diagnosis not present

## 2023-07-12 DIAGNOSIS — I2609 Other pulmonary embolism with acute cor pulmonale: Secondary | ICD-10-CM

## 2023-07-12 DIAGNOSIS — J9601 Acute respiratory failure with hypoxia: Secondary | ICD-10-CM | POA: Diagnosis not present

## 2023-07-12 DIAGNOSIS — I2489 Other forms of acute ischemic heart disease: Secondary | ICD-10-CM

## 2023-07-12 LAB — COMPREHENSIVE METABOLIC PANEL WITH GFR
ALT: 30 U/L (ref 0–44)
AST: 129 U/L — ABNORMAL HIGH (ref 15–41)
Albumin: 2.1 g/dL — ABNORMAL LOW (ref 3.5–5.0)
Alkaline Phosphatase: 44 U/L (ref 38–126)
Anion gap: 6 (ref 5–15)
BUN: 17 mg/dL (ref 6–20)
CO2: 30 mmol/L (ref 22–32)
Calcium: 8.1 mg/dL — ABNORMAL LOW (ref 8.9–10.3)
Chloride: 98 mmol/L (ref 98–111)
Creatinine, Ser: 0.76 mg/dL (ref 0.44–1.00)
GFR, Estimated: >60 mL/min (ref >60)
Glucose, Bld: 81 mg/dL (ref 70–99)
Potassium: 4 mmol/L (ref 3.5–5.1)
Sodium: 134 mmol/L — ABNORMAL LOW (ref 135–145)
Total Bilirubin: 0.7 mg/dL (ref 0.0–1.2)
Total Protein: 8.3 g/dL — ABNORMAL HIGH (ref 6.5–8.1)

## 2023-07-12 LAB — URINALYSIS, ROUTINE W REFLEX MICROSCOPIC
Bilirubin Urine: NEGATIVE
Glucose, UA: NEGATIVE mg/dL
Ketones, ur: NEGATIVE mg/dL
Leukocytes,Ua: NEGATIVE
Nitrite: NEGATIVE
Protein, ur: 30 mg/dL — AB
Specific Gravity, Urine: >1.046 — ABNORMAL HIGH (ref 1.005–1.030)
pH: 5 (ref 5.0–8.0)

## 2023-07-12 LAB — CBC
HCT: 32 % — ABNORMAL LOW (ref 36.0–46.0)
Hemoglobin: 9.8 g/dL — ABNORMAL LOW (ref 12.0–15.0)
MCH: 27.6 pg (ref 26.0–34.0)
MCHC: 30.6 g/dL (ref 30.0–36.0)
MCV: 90.1 fL (ref 80.0–100.0)
Platelets: 233 10*3/uL (ref 150–400)
RBC: 3.55 MIL/uL — ABNORMAL LOW (ref 3.87–5.11)
RDW: 16.9 % — ABNORMAL HIGH (ref 11.5–15.5)
WBC: 2.8 10*3/uL — ABNORMAL LOW (ref 4.0–10.5)
nRBC: 0.7 % — ABNORMAL HIGH (ref 0.0–0.2)

## 2023-07-12 LAB — TSH: TSH: 5.595 u[IU]/mL — ABNORMAL HIGH (ref 0.350–4.500)

## 2023-07-12 LAB — MAGNESIUM: Magnesium: 1.6 mg/dL — ABNORMAL LOW (ref 1.7–2.4)

## 2023-07-12 MED ORDER — IOHEXOL 300 MG/ML  SOLN
100.0000 mL | Freq: Once | INTRAMUSCULAR | Status: AC | PRN
  Administered 2023-07-12: 100 mL via INTRAVENOUS

## 2023-07-12 MED ORDER — MAGNESIUM SULFATE 4 GM/100ML IV SOLN
4.0000 g | Freq: Once | INTRAVENOUS | Status: AC
  Administered 2023-07-12: 4 g via INTRAVENOUS
  Filled 2023-07-12: qty 100

## 2023-07-12 MED ORDER — ATORVASTATIN CALCIUM 80 MG PO TABS
80.0000 mg | ORAL_TABLET | Freq: Every day | ORAL | Status: DC
  Administered 2023-07-12 – 2023-07-15 (×4): 80 mg via ORAL
  Filled 2023-07-12 (×3): qty 1
  Filled 2023-07-12: qty 2

## 2023-07-12 NOTE — Plan of Care (Signed)

## 2023-07-12 NOTE — Progress Notes (Signed)
 Progress Note  Patient Name: [name] Date of Encounter: 07/12/2023  Primary Cardiologist: Dietrich Pates, MD  Interval Summary   Chart reviewed including cardiology consultation by Dr. Tenny Craw yesterday.  I spoke with the patient this morning and also communicated with her daughter who was on speaker phone.  She has diuresed reasonably well, although recent systolics running 90s to low 100s and diuretics held.  Renal function stable.  I reviewed her echocardiogram report from yesterday.  Vital Signs    Vitals:   07/12/23 0600 07/12/23 0635 07/12/23 0700 07/12/23 0800  BP:  107/64 96/67 110/69  Pulse: (!) 114 (!) 112 (!) 103 (!) 116  Resp: (!) 29 (!) 31 (!) 35 (!) 30  Temp:      TempSrc:      SpO2: 100% 100% 100% 98%  Weight:      Height:       No intake or output data in the 24 hours ending 07/12/23 0921 Filed Weights   07/09/23 1124 07/10/23 0724 07/11/23 0500  Weight: 108.9 kg 103.5 kg 104.1 kg    Physical Exam   GEN: No acute distress.   Neck: Difficult to assess JVP. Cardiac: RRR, no murmur, rub, or gallop.  Respiratory: Nonlabored. Clear to auscultation bilaterally. GI: Soft, nontender, bowel sounds present. MS: Mild leg edema. Neuro: Residual left-sided weakness with history of previous stroke.  ECG/Telemetry    Telemetry reviewed showing sinus tachycardia.  Labs    Chemistry Recent Labs  Lab 07/09/23 1505 07/10/23 0622 07/11/23 0437 07/12/23 0429  NA 135 136 135 134*  K 3.7 3.9 3.9 4.0  CL 100 101 99 98  CO2 27 26 30 30   GLUCOSE 75 79 82 81  BUN 15 14 17 17   CREATININE 0.73 0.81 0.83 0.76  CALCIUM 8.5* 8.4* 8.2* 8.1*  PROT 9.5* 8.7*  --  8.3*  ALBUMIN 2.3* 2.1*  --  2.1*  AST 153* 132*  --  129*  ALT 39 33  --  30  ALKPHOS 60 52  --  44  BILITOT 0.6 0.5  --  0.7  GFRNONAA >60 >60 >60 >60  ANIONGAP 8 9 6 6     Hematology Recent Labs  Lab 07/10/23 0622 07/11/23 0437 07/12/23 0429  WBC 3.0* 3.1* 2.8*  RBC 4.03 3.83* 3.55*  HGB 11.1*  10.6* 9.8*  HCT 36.2 34.3* 32.0*  MCV 89.8 89.6 90.1  MCH 27.5 27.7 27.6  MCHC 30.7 30.9 30.6  RDW 17.2* 17.1* 16.9*  PLT 267 257 233   Cardiac Enzymes Recent Labs  Lab 07/09/23 2058 07/10/23 0108 07/10/23 0622 07/11/23 0437 07/11/23 0928  TROPONINIHS 108* 115* 124* 147* 146*    Cardiac Studies   Echocardiogram 07/11/2023:  1. Septal flattening consistent with RV volume/ pressure overload. Left  ventricular ejection fraction, by estimation, is 50 to 55%. The left  ventricle has low normal function.   2. Right ventricular systolic function moderate to severely reduced. The  right ventricular size is moderately enlarged. There is moderately  elevated pulmonary artery systolic pressure.   3. Right atrial size was mild to moderately dilated.   4. The mitral valve is normal in structure. Trivial mitral valve  regurgitation.   5. The aortic valve is tricuspid. Aortic valve regurgitation is not  visualized.   Assessment & Plan   1.  Fluid overload with evidence of moderate to severe RV dysfunction and at least moderately elevated RVSP.  Chest CTA on April 7 negative for pulmonary embolus although  showing enlarged central pulmonary arteries consistent with pulmonary hypertension.  2.  Mildly elevated high-sensitivity troponin I levels in flat pattern most consistent with demand ischemia.  3.  Hypothyroidism, on Synthroid as an outpatient per [facility] records.  TSH 2.33 in October 2024.  4.  Seropositive rheumatoid arthritis.  5.  History of stroke.  Has residual left hemiparesis.  6.  Mixed hyperlipidemia.  7.  Primary hypertension.  8.  Neutropenia and anemia.  She has diuresed approximately 3500 cc, IV Lasix presently held.  I discussed the situation with the patient and daughter via speaker phone.  I also communicated with Dr. Laural Benes who is taking over today on the hospitalist team.  I recommended that they go ahead and arrange for a bed at [facility] for  transfer.  Will communicate with the heart failure team to see her when she gets there regarding possible right heart catheterization and further cardiac workup.  Would recheck TSH and FLP.  Currently on aspirin and Lovenox.  Resume Lipitor 80 mg daily.  Also recommend clarification of outpatient Synthroid dose and resume.  For questions or updates, please contact [facility] Please consult [email or web address] for contact info under   Signed, Nona Dell, MD  07/12/2023, 9:21 AM

## 2023-07-12 NOTE — H&P (View-Only) (Signed)
 Advanced Heart Failure Team Consult Note   Primary Physician: [facility] Cardiologist:  Dietrich Pates, MD  Reason for Consultation: RV failure   HPI:    [name] is seen today for evaluation of RV  at the request of Dr Diona Browner.   [name] 58 year old with a history of CVA left hemiparesis, HTN, HLD, anemia, seropositive rheumatoid arthritis, and hypothyroidism. Functionally limited and has been bed bound or in a wheel chair.   Followed by Rheumatology for seropositive RA. She has been on Humira and methotrexate. Looks like this was started 2023.   Presented to [facility] with increased shortness of breath and weakness. 2 falls prior to admit.  D Dimer > 20, lactic acid 1.4, HS Trop 108>146 . CXR cardiomegaly with distended pulmonary vasculature. Chest CT negative for PE. Echo LVEF 50-55%, RV severely reduced, RVSP 47. Initially placed on IV lasix and received a total of 120 mg and had brisk diuresis.  IV lasix stopped 4/8 due to RV failure and hypotension.   On 4/9 developed fever. Tmax 101.3. Blood cultures obtained. NGTD.   Cardiology consulted with recommendations for RHC. Transferred to [facility] for Advanced Heart Failure Team to consult.   She is currently resting comfortably in bed. Laying flat. No orthopnea/ PND. Reports long h/o tobacco use. Smokes 1 ppd. Reports h/o snoring but no prior sleep study. + FH of HF (son and father). Says her RA was diagnosed several years ago, followed closely by Dr. Dimple Casey. Reports compliance w/ regimen. Also reports sedentary lifestyle. No prior h/o DVT/PEs.    Home Medications Prior to Admission medications   Medication Sig Start Date End Date Taking? Authorizing Provider  acetaminophen (TYLENOL) 500 MG tablet Take 1,000 mg by mouth every 6 (six) hours as needed for mild pain.   Yes [provider]  amLODipine (NORVASC) 10 MG tablet Take 10 mg by mouth daily.   Yes [provider]  aspirin EC 325 MG  tablet Take 1 tablet (325 mg total) by mouth daily. 08/24/17  Yes Hagler, Fleet Contras, MD  atorvastatin (LIPITOR) 80 MG tablet Take 80 mg by mouth daily.   Yes [provider]  HUMIRA, 2 PEN, 40 MG/0.4ML pen INJECT 40 MG INTO THE SKIN EVERY 14 DAYS 06/14/23  Yes Rice, Jamesetta Orleans, MD  hydrALAZINE (APRESOLINE) 50 MG tablet Take 1 tablet (50 mg total) by mouth every 8 (eight) hours. 01/18/22 07/09/23 Yes Shahmehdi, Gemma Payor, MD  methotrexate (RHEUMATREX) 2.5 MG tablet Take 6 tablets (15 mg total) by mouth once a week. Caution:Chemotherapy. Protect from light. 02/22/23  Yes Rice, Jamesetta Orleans, MD  traZODone (DESYREL) 100 MG tablet Take 100 mg by mouth at bedtime. 05/09/22  Yes [provider]  Vitamin D, Ergocalciferol, (DRISDOL) 1.25 MG (50000 UNIT) CAPS capsule Take 50,000 Units by mouth once a week. 01/06/23  Yes [provider]    Past Medical History: Past Medical History:  Diagnosis Date   High cholesterol    Hypertension    Hypothyroidism    Stroke (HCC)    weakness on left side.   Vaginal Pap smear, abnormal     Past Surgical History: Past Surgical History:  Procedure Laterality Date   DILATION AND CURETTAGE OF UTERUS     LASER ABLATION CONDOLAMATA Bilateral 09/27/2017   Procedure: LASER ABLATION CONDYLOMA AND REMOVAL OF PERIANAL WART;  Surgeon: [name], MD;  Location: [facility];  Service: Gynecology;  Laterality: Bilateral;   MASS EXCISION  Left 09/27/2017   Procedure: EXCISION LESION LEFT THIGH;  Surgeon: [name], MD;  Location: [facility];  Service: Gynecology;  Laterality: Left;    Family History: Family History  Problem Relation Age of Onset   Hypertension Mother    Liver disease Mother    Heart attack Father    HIV Sister    Arrhythmia Sister    Mood Disorder Sister    Arrhythmia Brother    CAD Brother     Social History: Social History   Socioeconomic History   Marital status: Married    Spouse name: Not on file   Number of children:  Not on file   Years of education: Not on file   Highest education level: Not on file  Occupational History   Not on file  Tobacco Use   Smoking status: Every Day    Current packs/day: 0.50    Average packs/day: 0.5 packs/day for 32.0 years (16.0 ttl pk-yrs)    Types: Cigarettes   Smokeless tobacco: Never  Vaping Use   Vaping status: Never Used  Substance and Sexual Activity   Alcohol use: No   Drug use: Yes    Types: Marijuana    Comment: occ   Sexual activity: Not Currently    Birth control/protection: Post-menopausal  Other Topics Concern   Not on file  Social History Narrative   Grew up in [place], [place].   Engaged to be married.   5 children.    Eats mainly meat/junk food.   Social Drivers of Corporate investment banker Strain: Low Risk  (04/07/2023)   Received from [facility]   Overall Financial Resource Strain (CARDIA)    Difficulty of Paying Living Expenses: Not hard at all  Food Insecurity: No Food Insecurity (07/09/2023)   Hunger Vital Sign    Worried About Running Out of Food in the Last Year: Never true    Ran Out of Food in the Last Year: Never true  Transportation Needs: No Transportation Needs (07/09/2023)   PRAPARE - Administrator, Civil Service (Medical): No    Lack of Transportation (Non-Medical): No  Physical Activity: Sufficiently Active (08/31/2021)   Exercise Vital Sign    Days of Exercise per Week: 3 days    Minutes of Exercise per Session: 60 min  Recent Concern: Physical Activity - Inactive (07/07/2021)   Received from [facility]   Exercise Vital Sign    Days of Exercise per Week: 0 days    Minutes of Exercise per Session: 0 min  Stress: No Stress Concern Present (08/31/2021)   [employer] of Occupational Health - Occupational Stress Questionnaire    Feeling of Stress : Not at all  Social Connections: Unknown (07/08/2022)   Received from [facility]   Social Network    Social Network: Not on  file    Allergies:  Allergies  Allergen Reactions   Lisinopril Nausea Only    Objective:    Vital Signs:   Temp:  [97.9 F (36.6 C)-101.3 F (38.5 C)] 97.9 F (36.6 C) (04/10 0715) Pulse Rate:  [83-114] 83 (04/10 0715) Resp:  [20-40] 25 (04/10 0715) BP: (91-130)/(52-95) 101/72 (04/10 0715) SpO2:  [94 %-100 %] 100 % (04/10 0715) Weight:  [103.6 kg] 103.6 kg (04/10 0350) Last BM Date : 07/09/23  Weight change: Filed Weights   07/10/23 0724 07/11/23 0500 07/13/23 0350  Weight: 103.5 kg 104.1 kg 103.6 kg    Intake/Output:  Intake/Output Summary (Last 24 hours) at 07/13/2023 0909 Last data filed at 07/13/2023 0900 Gross per 24 hour  Intake 666.96 ml  Output 1150 ml  Net -483.04 ml      Physical Exam    General:  obese, chronically ill appearing. No resp difficulty HEENT: dentition in poor repair, otherwise normal Neck: supple. Thick neck, JVD not well visualized . Carotids 2+ bilat; no bruits. No lymphadenopathy or thyromegaly appreciated. Cor: PMI nondisplaced. Regular rate & rhythm. No rubs, gallops or murmurs. Lungs: decreased BS at the bases bilaterally  Abdomen: soft, nontender, nondistended.  bruits or masses. Good bowel sounds. Extremities: no cyanosis, clubbing, rash, edema Neuro: alert & orientedx3, cranial nerves grossly intact. moves all 4 extremities w/o difficulty. Affect pleasant   Telemetry   NSR 80s, personally reviewed   EKG    NSR 87 bpm, personally reviewed    Labs   Basic Metabolic Panel: Recent Labs  Lab 07/09/23 1505 07/10/23 0622 07/11/23 0437 07/12/23 0429  NA 135 136 135 134*  K 3.7 3.9 3.9 4.0  CL 100 101 99 98  CO2 27 26 30 30   GLUCOSE 75 79 82 81  BUN 15 14 17 17   CREATININE 0.73 0.81 0.83 0.76  CALCIUM 8.5* 8.4* 8.2* 8.1*  MG  --  1.6* 1.4* 1.6*  PHOS  --  4.0  --   --     Liver Function Tests: Recent Labs  Lab 07/09/23 1505 07/10/23 0622 07/12/23 0429  AST 153* 132* 129*  ALT 39 33 30  ALKPHOS 60 52 44   BILITOT 0.6 0.5 0.7  PROT 9.5* 8.7* 8.3*  ALBUMIN 2.3* 2.1* 2.1*   No results for input(s): "LIPASE", "AMYLASE" in the last 168 hours. No results for input(s): "AMMONIA" in the last 168 hours.  CBC: Recent Labs  Lab 07/09/23 1320 07/10/23 0622 07/11/23 0437 07/12/23 0429  WBC 1.9* 3.0* 3.1* 2.8*  NEUTROABS 0.8*  --   --   --   HGB 11.2* 11.1* 10.6* 9.8*  HCT 35.9* 36.2 34.3* 32.0*  MCV 89.1 89.8 89.6 90.1  PLT 252 267 257 233    Cardiac Enzymes: No results for input(s): "CKTOTAL", "CKMB", "CKMBINDEX", "TROPONINI" in the last 168 hours.  BNP: BNP (last 3 results) Recent Labs    07/09/23 1320  BNP 566.0*    ProBNP (last 3 results) No results for input(s): "PROBNP" in the last 8760 hours.   CBG: No results for input(s): "GLUCAP" in the last 168 hours.  Coagulation Studies: No results for input(s): "LABPROT", "INR" in the last 72 hours.   Imaging   CT ABDOMEN PELVIS W CONTRAST Result Date: 07/12/2023 CLINICAL DATA:  Renal mass/cyst, indeterminate EXAM: CT ABDOMEN AND PELVIS WITH CONTRAST TECHNIQUE: Multidetector CT imaging of the abdomen and pelvis was performed using the standard protocol following bolus administration of intravenous contrast. RADIATION DOSE REDUCTION: This exam was performed according to the departmental dose-optimization program which includes automated exposure control, adjustment of the mA and/or kV according to patient size and/or use of iterative reconstruction technique. CONTRAST:  OMNIPAQUE IOHEXOL 300 MG/ML  SOLN COMPARISON:  July 10, 2023, January 14, 2022 FINDINGS: Lower chest: No focal airspace consolidation or pleural effusion. Hepatobiliary: No mass.No radiopaque stones or wall thickening of the gallbladder.No intrahepatic or extrahepatic biliary ductal dilation.The portal veins are patent. Pancreas: No mass or main ductal dilation.No peripancreatic inflammation or fluid collection. Spleen: Normal size. No mass. Adrenals/Urinary Tract:  No adrenal masses. No renal mass. The region of concern  in the upper pole of the left kidney on the prior CT is a region of cortical lobulation of the normal renal cortex. No nephrolithiasis or hydronephrosis. The urinary bladder is distended without focal abnormality. Stomach/Bowel: The stomach is decompressed without focal abnormality. No small bowel wall thickening or inflammation. No small bowel obstruction. Normal appendix. Vascular/Lymphatic: No aortic aneurysm. Diffuse aortoiliac atherosclerosis. No intraabdominal or pelvic lymphadenopathy. Reproductive: Age-related atrophy of the uterus and ovaries. No concerning adnexal mass.No free pelvic fluid. Other: No pneumoperitoneum, ascites, or mesenteric inflammation. Musculoskeletal: No acute fracture or destructive lesion.Multilevel degenerative disc disease of the spine. Bilateral hip osteoarthritis, worst on the right, where there is bone on bone articulation. IMPRESSION: 1. No acute intra-abdominal or pelvic abnormality. 2. The region of concern in the upper pole of the left kidney on the prior CT corresponds to a region of cortical lobulation of the normal renal cortex. No concerning renal mass. Electronically Signed   By: Wallie Char M.D.   On: 07/12/2023 16:46   DG CHEST PORT 1 VIEW Result Date: 07/12/2023 CLINICAL DATA:  Fever, dyspnea EXAM: PORTABLE CHEST 1 VIEW COMPARISON:  July 09, 2023, July 10, 2023 FINDINGS: Low lung volumes. No focal airspace consolidation, pleural effusion, or pneumothorax. Mild cardiomegaly. No acute fracture or destructive lesion. Multilevel thoracic osteophytosis. IMPRESSION: Cardiomegaly.  No pneumonia or pulmonary edema. Electronically Signed   By: Wallie Char M.D.   On: 07/12/2023 16:13     Medications:     Current Medications:  aspirin EC  325 mg Oral Daily   atorvastatin  80 mg Oral Daily   Chlorhexidine Gluconate Cloth  6 each Topical Daily   enoxaparin (LOVENOX) injection  40 mg Subcutaneous Q24H    feeding supplement  237 mL Oral BID BM   levothyroxine  50 mcg Oral QAC breakfast   nicotine  7 mg Transdermal Daily   polyethylene glycol  17 g Oral Daily    Infusions:     Patient Profile   [name] 58 year old with a history of CVA, HTN, HLD, RA, and hypothyroidism.   Admitted with Acute RV Failure.   Assessment/Plan   1. Acute RV Failure  Echo with preserved LVEF and severely reduced RV. Uncleared etiology. Suspect underlying PH. CT of chest w/ enlarged central PA suggesting PAH. CTA negative for PE. BNP 566. Initially diuresed with IV lasix. Brisk diuresis noted. IV lasix stopped 4/8. Appears mildly elevated today.  - restart IV diuretics today  - Plan for RHC tomorrow to further assess  - will need further w/u w/ PFTs, V/Q scan and outpatient sleep study   2. HS Trop mildly elevated - 108>>115>>124>>147>>146 - Suspect demand ischemia - Denies CP but w/ RV dysfunction, would recommend LHC at time of RHC to c/o coronary disease   3. Hypothyroidism  - on Levothyroxine   4. Seropositive RA - on Humira and methotrexate - followed by Dr. Dimple Casey    5. Fever - fever resolved - Leukopenia- in the setting of Humira  - Blood Cultures NGTD    Length of Stay: [address], PA-C  07/13/2023, 9:09 AM  Advanced Heart Failure Team Pager [phone] (M-F; 7a - 5p)  Please contact [facility] for night-coverage after hours (4p -7a ) and weekends on [email or web address]

## 2023-07-12 NOTE — Progress Notes (Signed)
   07/12/23 2338  BiPAP/CPAP/SIPAP  BiPAP/CPAP/SIPAP Pt Type Adult  BiPAP/CPAP/SIPAP DREAMSTATIOND  Mask Type Full face mask  Dentures removed? Not applicable  Mask Size Medium  Respiratory Rate 24 breaths/min  EPAP 6 cmH2O  Flow Rate 5 lpm  Patient Home Machine No  Patient Home Mask No  Patient Home Tubing No  Auto Titrate No  Device Plugged into RED Power Outlet Yes  BiPAP/CPAP /SiPAP Vitals  Resp (!) 24  Bilateral Breath Sounds Clear;Diminished  MEWS Score/Color  MEWS Score 1  MEWS Score Color Green

## 2023-07-12 NOTE — Progress Notes (Addendum)
 PROGRESS NOTE   Lanique Gonzalo  ZOX:[id number] DOB: May 06, 1965 DOA: 07/09/2023 PCP: [facility]   Chief Complaint  Patient presents with   Shortness of Breath   Insomnia   Level of care: Progressive  Brief Admission History:  58 y.o. female with medical history significant of hypertension, hyperlipidemia, acquired hypothyroidism, reportedly no longer on levothyroxine, CVA with residual left-sided weakness, tobacco use disorder who presented to the emergency department from home via EMS due to 2-week onset of increased shortness of breath with difficulty in being able to sleep at night, so she has been taking Benadryl with subsequent stomach upset.  Family thought that her shortness of breath was the cause of difficulty to sleep at night.  Patient also endorsed increased weakness and have sustained 2 falls during transfer due to weakness.   Patient is bed bound -wheelchair bound at baseline.   Patient was admitted with acute CHF with noted elevated troponin levels and suspected PE with elevated D-dimer.  CT angiogram did not demonstrate PE.  She continued to have symptoms of heart failure requiring IV diuresis and cardiology asked to consult to assist with management.  2D echocardiogram with LVEF 50-55% and signs of RV failure.  Cardiology team assessed and felt she required transfer to [facility] for consultation with the advanced heart failure service and likely RHC.    Assessment and Plan:  Acute HFpEF  RV Heart Failure  - discussed with cardiology attending, plan is for her to transfer to [facility] for consultation with advanced heart failure team and possibly have cardiac catheterization - Pt has diuresed more than 3.5L and feeling better - holding diuresis now given hypotension - transfer orders placed for progressive bed at [facility] - cardiology team arranging for advanced heart failure team to see at [facility] - ok to resume diet today  Acute respiratory failure with hypoxia  -  wean to room air oxygen as able  - not on home oxygen   Hypotension  - holding IV furosemide for now - bolus fluid if needed for soft BPs  Leukopenia  - secondary to methotrexate therapy  - following  Hypomagnesemia - IV replacement given - recheck in AM   History of CVA - resumed home atorvastatin  Elevated D dimer - CTA chest negative for PE   1.3 cm left renal mass - radiologist recommended CT abd/pelvis with contrast to better define - ordered CT on 07/12/23   Fever  - temp up to 101.5 - ordered blood culture x 2  - repeat portable CXR and check urine   DVT prophylaxis: enoxaparin Code Status: Full  Family Communication: husband, daughter 4/9  Disposition: transfer to [facility]    Consultants:  Cardiology Advanced heart failure Procedures:  TBD Antimicrobials:    Subjective: Pt reports ongoing SOB.  No CP.    Objective: Vitals:   07/12/23 0800 07/12/23 0935 07/12/23 1000 07/12/23 1100  BP: 110/69 102/65 101/71 93/76  Pulse: (!) 116  (!) 113 (!) 114  Resp: (!) 30  (!) 29 (!) 28  Temp:  (!) 100.5 F (38.1 C)    TempSrc:  Oral    SpO2: 98%  100% 99%  Weight:      Height:        Intake/Output Summary (Last 24 hours) at 07/12/2023 1147 Last data filed at 07/12/2023 1100 Gross per 24 hour  Intake 66.96 ml  Output --  Net 66.96 ml   Filed Weights   07/09/23 1124 07/10/23 0724 07/11/23 0500  Weight: 108.9 kg 103.5 kg 104.1 kg   Examination:  General exam: Appears calm and comfortable  Respiratory system: Clear to auscultation. Respiratory effort normal. Cardiovascular system: normal S1 & S2 heard. No JVD, murmurs, rubs, gallops or clicks. No pedal edema. Gastrointestinal system: Abdomen is nondistended, soft and nontender. No organomegaly or masses felt. Normal bowel sounds heard. Central nervous system: Alert and oriented. No focal neurological deficits. Extremities: Symmetric 5 x 5 power. Skin: No rashes, lesions or ulcers. Psychiatry: Judgement and  insight appear normal. Mood & affect appropriate.   Data Reviewed: I have personally reviewed following labs and imaging studies  CBC: Recent Labs  Lab 07/09/23 1320 07/10/23 0622 07/11/23 0437 07/12/23 0429  WBC 1.9* 3.0* 3.1* 2.8*  NEUTROABS 0.8*  --   --   --   HGB 11.2* 11.1* 10.6* 9.8*  HCT 35.9* 36.2 34.3* 32.0*  MCV 89.1 89.8 89.6 90.1  PLT 252 267 257 233    Basic Metabolic Panel: Recent Labs  Lab 07/09/23 1505 07/10/23 0622 07/11/23 0437 07/12/23 0429  NA 135 136 135 134*  K 3.7 3.9 3.9 4.0  CL 100 101 99 98  CO2 27 26 30 30   GLUCOSE 75 79 82 81  BUN 15 14 17 17   CREATININE 0.73 0.81 0.83 0.76  CALCIUM 8.5* 8.4* 8.2* 8.1*  MG  --  1.6* 1.4* 1.6*  PHOS  --  4.0  --   --     CBG: No results for input(s): "GLUCAP" in the last 168 hours.  Recent Results (from the past 240 hours)  Blood culture (routine x 2)     Status: None (Preliminary result)   Collection Time: 07/09/23 12:27 PM   Specimen: BLOOD RIGHT ARM  Result Value Ref Range Status   Specimen Description BLOOD RIGHT ARM AEROBIC BOTTLE ONLY  Final   Special Requests Blood Culture adequate volume  Final   Culture   Final    NO GROWTH 3 DAYS Performed at [facility], [address] [place], [place] [address]    Report Status PENDING  Incomplete  Blood culture (routine x 2)     Status: None (Preliminary result)   Collection Time: 07/09/23 12:27 PM   Specimen: BLOOD  Result Value Ref Range Status   Specimen Description BLOOD BLOOD LEFT HAND  Final   Special Requests   Final    AEROBIC BOTTLE ONLY Blood Culture results may not be optimal due to an inadequate volume of blood received in culture bottles   Culture   Final    NO GROWTH 3 DAYS Performed at [facility], [address] [place], [place] [address]    Report Status PENDING  Incomplete  MRSA Next Gen by PCR, Nasal     Status: None   Collection Time: 07/09/23  8:34 PM   Specimen: Nasal Mucosa; Nasal Swab  Result Value Ref Range Status    MRSA by PCR Next Gen NOT DETECTED NOT DETECTED Final    Comment: (NOTE) The GeneXpert MRSA Assay (FDA approved for NASAL specimens only), is one component of a comprehensive MRSA colonization surveillance program. It is not intended to diagnose MRSA infection nor to guide or monitor treatment for MRSA infections. Test performance is not FDA approved in patients less than 58 years old. Performed at [facility], [address], [place], [place] [address]      Radiology Studies: ECHOCARDIOGRAM COMPLETE Result Date: 07/11/2023    ECHOCARDIOGRAM REPORT   Patient Name:   [name] Date  of Exam: 07/11/2023 Medical Rec #:  [id number]    Height:       66.0 in Accession #:    [id number]   Weight:       229.5 lb Date of Birth:  12/06/1965    BSA:          2.120 m Patient Age:    58 years     BP:           91/53 mmHg Patient Gender: F            HR:           107 bpm. [facility] Procedure: 2D Echo, Cardiac Doppler, Color Doppler and Intracardiac            Opacification Agent (Both Spectral and Color Flow Doppler were            utilized during procedure). Indications:    CHF-acute diastolic  History:        Patient has prior history of Echocardiogram examinations, most                 recent 06/26/2013. CHF, Stroke; Risk Factors:Hypertension and                 Dyslipidemia.  Sonographer:    Vern Claude Referring Phys: [id number] OLADAPO ADEFESO IMPRESSIONS  1. Septal flattening consistent with RV volume/ pressure overload. Left ventricular ejection fraction, by estimation, is 50 to 55%. The left ventricle has low normal function.  2. Right ventricular systolic function moderate to severely reduced. The right ventricular size is moderately enlarged. There is moderately elevated pulmonary artery systolic pressure.  3. Right atrial size was mild to moderately dilated.  4. The mitral valve is normal in structure. Trivial mitral valve regurgitation.  5. The aortic valve is tricuspid. Aortic valve  regurgitation is not visualized. FINDINGS  Left Ventricle: Septal flattening consistent with RV volume/ pressure overload. Left ventricular ejection fraction, by estimation, is 50 to 55%. The left ventricle has low normal function. The left ventricular internal cavity size was normal in size. There is no left ventricular hypertrophy. Right Ventricle: The right ventricular size is moderately enlarged. Right vetricular wall thickness was not assessed. Right ventricular systolic function moderate to severely reduced. There is moderately elevated pulmonary artery systolic pressure. The tricuspid regurgitant velocity is 3.12 m/s, and with an assumed right atrial pressure of 8 mmHg, the estimated right ventricular systolic pressure is 46.9 mmHg. Left Atrium: Left atrial size was normal in size. Right Atrium: Right atrial size was mild to moderately dilated. Pericardium: There is no evidence of pericardial effusion. Mitral Valve: The mitral valve is normal in structure. Trivial mitral valve regurgitation. MV peak gradient, 2.3 mmHg. The mean mitral valve gradient is 1.0 mmHg. Tricuspid Valve: The tricuspid valve is normal in structure. Tricuspid valve regurgitation is mild. Aortic Valve: The aortic valve is tricuspid. Aortic valve regurgitation is not visualized. Aortic valve mean gradient measures 4.0 mmHg. Aortic valve peak gradient measures 7.6 mmHg. Aortic valve area, by VTI measures 2.25 cm. Pulmonic Valve: The pulmonic valve was normal in structure. Pulmonic valve regurgitation is not visualized. Aorta: The aortic root and ascending aorta are structurally normal, with no evidence of dilitation. IAS/Shunts: The interatrial septum was not well visualized.  LEFT VENTRICLE PLAX 2D LVIDd:         4.40 cm      Diastology LVIDs:         2.60 cm  LV e' medial:    7.51 cm/s LV PW:         0.80 cm      LV E/e' medial:  7.6 LV IVS:        0.90 cm      LV e' lateral:   10.20 cm/s LVOT diam:     2.10 cm      LV E/e' lateral:  5.6 LV SV:         36 LV SV Index:   17 LVOT Area:     3.46 cm  LV Volumes (MOD) LV vol d, MOD A2C: 108.0 ml LV vol d, MOD A4C: 106.0 ml LV vol s, MOD A2C: 48.8 ml LV vol s, MOD A4C: 43.1 ml LV SV MOD A2C:     59.2 ml LV SV MOD A4C:     106.0 ml LV SV MOD BP:      61.6 ml RIGHT VENTRICLE            IVC RV Basal diam:  4.80 cm    IVC diam: 1.80 cm RV Mid diam:    2.90 cm RV S prime:     9.79 cm/s TAPSE (M-mode): 1.9 cm LEFT ATRIUM           Index        RIGHT ATRIUM           Index LA diam:      3.00 cm 1.41 cm/m   RA Area:     24.20 cm LA Vol (A2C): 28.6 ml 13.49 ml/m  RA Volume:   80.20 ml  37.83 ml/m LA Vol (A4C): 15.9 ml 7.50 ml/m  AORTIC VALVE                    PULMONIC VALVE AV Area (Vmax):    2.51 cm     PV Vmax:       1.00 m/s AV Area (Vmean):   1.97 cm     PV Peak grad:  4.0 mmHg AV Area (VTI):     2.25 cm AV Vmax:           138.00 cm/s AV Vmean:          97.800 cm/s AV VTI:            0.160 m AV Peak Grad:      7.6 mmHg AV Mean Grad:      4.0 mmHg LVOT Vmax:         100.00 cm/s LVOT Vmean:        55.500 cm/s LVOT VTI:          0.104 m LVOT/AV VTI ratio: 0.65  AORTA Ao Root diam: 2.90 cm Ao Asc diam:  3.00 cm MITRAL VALVE               TRICUSPID VALVE MV Area (PHT): 5.38 cm    TR Peak grad:   38.9 mmHg MV Area VTI:   2.93 cm    TR Vmax:        312.00 cm/s MV Peak grad:  2.3 mmHg MV Mean grad:  1.0 mmHg    SHUNTS MV Vmax:       0.76 m/s    Systemic VTI:  0.10 m MV Vmean:      55.8 cm/s   Systemic Diam: 2.10 cm MV Decel Time: 141 msec MV E velocity: 57.00 cm/s MV A velocity: 74.60 cm/s MV E/A ratio:  0.76 Dietrich Pates MD Electronically signed  by Dietrich Pates MD Signature Date/Time: 07/11/2023/4:18:32 PM    Final    CT Angio Chest PE W/Cm &/Or Wo Cm Result Date: 07/10/2023 CLINICAL DATA:  Increasing shortness of breath for the past 2 weeks. Elevated D-dimer. EXAM: CT ANGIOGRAPHY CHEST WITH CONTRAST TECHNIQUE: Multidetector CT imaging of the chest was performed using the standard protocol during bolus  administration of intravenous contrast. Multiplanar CT image reconstructions and MIPs were obtained to evaluate the vascular anatomy. RADIATION DOSE REDUCTION: This exam was performed according to the departmental dose-optimization program which includes automated exposure control, adjustment of the mA and/or kV according to patient size and/or use of iterative reconstruction technique. CONTRAST:  75mL OMNIPAQUE IOHEXOL 350 MG/ML SOLN COMPARISON:  01/14/2022 FINDINGS: Cardiovascular: Normally opacified pulmonary arteries with no pulmonary arterial filling defects seen. Enlarged heart. No pericardial effusion. Minimal atheromatous aortic and coronary artery calcification. Mildly enlarged central pulmonary arteries with a main pulmonary artery diameter of 3.5 cm. Mediastinum/Nodes: No enlarged mediastinal, hilar, or axillary lymph nodes. Thyroid gland, trachea, and esophagus demonstrate no significant findings. Lungs/Pleura: Minimal left pleural effusion with improvement. Mild left lower lobe and minimal right lower lobe atelectasis with improvement. Resolved right pleural fluid. Upper Abdomen: Partially included 1.3 cm upper pole left renal low-density mass or cyst. Musculoskeletal: Thoracic and lower cervical spine degenerative changes. Review of the MIP images confirms the above findings. IMPRESSION: 1. No pulmonary emboli. 2. Minimal left pleural effusion with improvement. 3. Cardiomegaly. 4. Mildly enlarged central pulmonary arteries, suggesting pulmonary arterial hypertension. 5. Partially included 1.3 cm upper pole left renal low-density mass or cyst. Recommend abdomen and pelvis CT with contrast for further evaluation. Ultrasound would most likely not be beneficial due to body habitus. 6. Minimal calcific coronary artery and aortic atherosclerosis. Electronically Signed   By: Beckie Salts M.D.   On: 07/10/2023 16:44    Scheduled Meds:  aspirin EC  325 mg Oral Daily   atorvastatin  80 mg Oral Daily    Chlorhexidine Gluconate Cloth  6 each Topical Daily   enoxaparin (LOVENOX) injection  40 mg Subcutaneous Q24H   feeding supplement  237 mL Oral BID BM   nicotine  7 mg Transdermal Daily   polyethylene glycol  17 g Oral Daily   Continuous Infusions:   LOS: 3 days   Critical Care Procedure Note Authorized and Performed by: Maryln Manuel MD  Total Critical Care time:  57 mins Due to a high probability of clinically significant, life threatening deterioration, the patient required my highest level of preparedness to intervene emergently and I personally spent this critical care time directly and personally managing the patient.  This critical care time included obtaining a history; examining the patient, pulse oximetry; ordering and review of studies; arranging urgent treatment with development of a management plan; evaluation of patient's response of treatment; frequent reassessment; and discussions with other providers.  This critical care time was performed to assess and manage the high probability of imminent and life threatening deterioration that could result in multi-organ failure.  It was exclusive of separately billable procedures and treating other patients and teaching time.   Standley Dakins, MD How to contact the [facility] Attending or Consulting provider 7A - 7P or covering provider during after hours 7P -7A, for this patient?  Check the care team in [facility] and look for a) attending/consulting [facility] provider listed and b) the [facility] team listed Log into [email or web address] to find provider on call.  Locate the [facility] provider you are looking for under [facility] and page to a number that you can be directly reached. If you still have difficulty reaching the provider, please page the [facility] (Director on Call) for the Hospitalists listed on amion for assistance.  07/12/2023, 11:47 AM

## 2023-07-12 NOTE — Hospital Course (Signed)
 58 y.o. female with medical history significant of hypertension, hyperlipidemia, acquired hypothyroidism, reportedly no longer on levothyroxine, CVA with residual left-sided weakness, tobacco use disorder who presented to the emergency department from home via EMS due to 2-week onset of increased shortness of breath with difficulty in being able to sleep at night, so she has been taking Benadryl with subsequent stomach upset.  Family thought that her shortness of breath was the cause of difficulty to sleep at night.  Patient also endorsed increased weakness and have sustained 2 falls during transfer due to weakness.   Patient is bed bound -wheelchair bound at baseline.   Patient was admitted with acute CHF with noted elevated troponin levels and suspected PE with elevated D-dimer.  CT angiogram did not demonstrate PE.  She continued to have symptoms of heart failure requiring IV diuresis and cardiology asked to consult to assist with management.  2D echocardiogram with LVEF 50-55% and signs of RV failure.  Cardiology team assessed and felt she required transfer to [facility] for consultation with the advanced heart failure service and likely RHC.

## 2023-07-12 NOTE — Progress Notes (Signed)
 Patient HR and respiration have increased due current medical status.  RT placed on CPAP of 6 with 3lpm bled in to assist with breathing. RN/RT explained the need for CPAP and she was agreeable to try the device. She is resting comfortable at present and will continue to monitor.

## 2023-07-12 NOTE — Progress Notes (Addendum)
 0900 patient alert x2 unsure of month on room air bp cuff changed to smaller cuff since its on her wrist. IV dressing changed husband at bedside. 1000 cardiology spoke with patient patients daughter via phone and patients husband at bedside regarding cardiac cath and transfer to [place] all in agreement. Diet reinstated since there is no time frame for procedure or transfer at this time.  1200 temp spiked tylenol given MD informed new ordered received  1300 patient temp broke bath provided external cath tubing and canister all changed 1545 patient hasn't not eaten today states she doesn't want to eat not hungry

## 2023-07-12 NOTE — Consult Note (Incomplete)
 Advanced Heart Failure Team Consult Note   Primary Physician: [facility] Cardiologist:  Dietrich Pates, MD  Reason for Consultation: RV failure   HPI:    [name] is seen today for evaluation of RV  at the request of Dr Diona Browner.   [name] 59 year old with a history of CVA left hemiparesis, HTN, HLD, anemia, seropositive rheumatoid arthritis, and hypothyroidism. Functionally limited and has been bed bound or in a wheel chair.   Followed by Rheumatology for seropositive RA. She has been on Humira and methotrexate. Looks like this was started 2023.   Presented to [facility] with increased shortness of breath and weakness. 2 falls prior to admit.  D Dimer > 20, lactic acid 1.4, HS Trop 108>146 . CXR cardiomegaly with distended pulmonary vasculature. Chest CT negative for PE. Echo LVEF 50-55%, RV severely reduced, RVSP 47. Initially placed on IV lasix and received a total of 120 mg and had brisk diuresis.  IV lasix stopped 4/8 due to RV failure and hypotension.   On 4/9 developed fever. Tmax 101.3. Blood cultures obtained. NGTD.   Cardiology consulted with recommendations for RHC. Transferred to [facility] for Advanced Heart Failure Team to consult.   She is currently resting comfortably in bed. Laying flat. No orthopnea/ PND. Reports long h/o tobacco use. Smokes 1 ppd. Reports h/o snoring but no prior sleep study. + FH of HF (son and father). Says her RA was diagnosed several years ago, followed closely by Dr. Dimple Casey. Reports compliance w/ regimen. Also reports sedentary lifestyle. No prior h/o DVT/PEs.    Home Medications Prior to Admission medications   Medication Sig Start Date End Date Taking? Authorizing Provider  acetaminophen (TYLENOL) 500 MG tablet Take 1,000 mg by mouth every 6 (six) hours as needed for mild pain.   Yes [provider]  amLODipine (NORVASC) 10 MG tablet Take 10 mg by mouth daily.   Yes [provider]  aspirin EC 325 MG  tablet Take 1 tablet (325 mg total) by mouth daily. 08/24/17  Yes Hagler, Fleet Contras, MD  atorvastatin (LIPITOR) 80 MG tablet Take 80 mg by mouth daily.   Yes [provider]  HUMIRA, 2 PEN, 40 MG/0.4ML pen INJECT 40 MG INTO THE SKIN EVERY 14 DAYS 06/14/23  Yes Rice, Jamesetta Orleans, MD  hydrALAZINE (APRESOLINE) 50 MG tablet Take 1 tablet (50 mg total) by mouth every 8 (eight) hours. 01/18/22 07/09/23 Yes Shahmehdi, Gemma Payor, MD  methotrexate (RHEUMATREX) 2.5 MG tablet Take 6 tablets (15 mg total) by mouth once a week. Caution:Chemotherapy. Protect from light. 02/22/23  Yes Rice, Jamesetta Orleans, MD  traZODone (DESYREL) 100 MG tablet Take 100 mg by mouth at bedtime. 05/09/22  Yes [provider]  Vitamin D, Ergocalciferol, (DRISDOL) 1.25 MG (50000 UNIT) CAPS capsule Take 50,000 Units by mouth once a week. 01/06/23  Yes [provider]    Past Medical History: Past Medical History:  Diagnosis Date   High cholesterol    Hypertension    Hypothyroidism    Stroke (HCC)    weakness on left side.   Vaginal Pap smear, abnormal     Past Surgical History: Past Surgical History:  Procedure Laterality Date   DILATION AND CURETTAGE OF UTERUS     LASER ABLATION CONDOLAMATA Bilateral 09/27/2017   Procedure: LASER ABLATION CONDYLOMA AND REMOVAL OF PERIANAL WART;  Surgeon: [name], MD;  Location: [facility];  Service: Gynecology;  Laterality: Bilateral;   MASS EXCISION  Left 09/27/2017   Procedure: EXCISION LESION LEFT THIGH;  Surgeon: [name], MD;  Location: [facility];  Service: Gynecology;  Laterality: Left;    Family History: Family History  Problem Relation Age of Onset   Hypertension Mother    Liver disease Mother    Heart attack Father    HIV Sister    Arrhythmia Sister    Mood Disorder Sister    Arrhythmia Brother    CAD Brother     Social History: Social History   Socioeconomic History   Marital status: Married    Spouse name: Not on file   Number of children:  Not on file   Years of education: Not on file   Highest education level: Not on file  Occupational History   Not on file  Tobacco Use   Smoking status: Every Day    Current packs/day: 0.50    Average packs/day: 0.5 packs/day for 32.0 years (16.0 ttl pk-yrs)    Types: Cigarettes   Smokeless tobacco: Never  Vaping Use   Vaping status: Never Used  Substance and Sexual Activity   Alcohol use: No   Drug use: Yes    Types: Marijuana    Comment: occ   Sexual activity: Not Currently    Birth control/protection: Post-menopausal  Other Topics Concern   Not on file  Social History Narrative   Grew up in [place], [place].   Engaged to be married.   5 children.    Eats mainly meat/junk food.   Social Drivers of Corporate investment banker Strain: Low Risk  (04/07/2023)   Received from [facility]   Overall Financial Resource Strain (CARDIA)    Difficulty of Paying Living Expenses: Not hard at all  Food Insecurity: No Food Insecurity (07/09/2023)   Hunger Vital Sign    Worried About Running Out of Food in the Last Year: Never true    Ran Out of Food in the Last Year: Never true  Transportation Needs: No Transportation Needs (07/09/2023)   PRAPARE - Administrator, Civil Service (Medical): No    Lack of Transportation (Non-Medical): No  Physical Activity: Sufficiently Active (08/31/2021)   Exercise Vital Sign    Days of Exercise per Week: 3 days    Minutes of Exercise per Session: 60 min  Recent Concern: Physical Activity - Inactive (07/07/2021)   Received from [place]   Exercise Vital Sign    Days of Exercise per Week: 0 days    Minutes of Exercise per Session: 0 min  Stress: No Stress Concern Present (08/31/2021)   [employer] of Occupational Health - Occupational Stress Questionnaire    Feeling of Stress : Not at all  Social Connections: Unknown (07/08/2022)   [facility]   Social Network    Social Network: Not on  file    Allergies:  Allergies  Allergen Reactions   Lisinopril Nausea Only    Objective:    Vital Signs:   Temp:  [97.9 F (36.6 C)-101.3 F (38.5 C)] 97.9 F (36.6 C) (04/10 0715) Pulse Rate:  [83-114] 83 (04/10 0715) Resp:  [20-40] 25 (04/10 0715) BP: (91-130)/(52-95) 101/72 (04/10 0715) SpO2:  [94 %-100 %] 100 % (04/10 0715) Weight:  [103.6 kg] 103.6 kg (04/10 0350) Last BM Date : 07/09/23  Weight change: Filed Weights   07/10/23 0724 07/11/23 0500 07/13/23 0350  Weight: 103.5 kg 104.1 kg 103.6 kg    Intake/Output:  Intake/Output Summary (Last 24 hours) at 07/13/2023 0909 Last data filed at 07/13/2023 0900 Gross per 24 hour  Intake 666.96 ml  Output 1150 ml  Net -483.04 ml      Physical Exam    General:  obese, chronically ill appearing. No resp difficulty HEENT: dentition in poor repair, otherwise normal Neck: supple. Thick neck, JVD not well visualized . Carotids 2+ bilat; no bruits. No lymphadenopathy or thyromegaly appreciated. Cor: PMI nondisplaced. Regular rate & rhythm. No rubs, gallops or murmurs. Lungs: decreased BS at the bases bilaterally  Abdomen: soft, nontender, nondistended.  bruits or masses. Good bowel sounds. Extremities: no cyanosis, clubbing, rash, edema Neuro: alert & orientedx3, cranial nerves grossly intact. moves all 4 extremities w/o difficulty. Affect pleasant   Telemetry   NSR 80s, personally reviewed   EKG    NSR 87 bpm, personally reviewed    Labs   Basic Metabolic Panel: Recent Labs  Lab 07/09/23 1505 07/10/23 0622 07/11/23 0437 07/12/23 0429  NA 135 136 135 134*  K 3.7 3.9 3.9 4.0  CL 100 101 99 98  CO2 27 26 30 30   GLUCOSE 75 79 82 81  BUN 15 14 17 17   CREATININE 0.73 0.81 0.83 0.76  CALCIUM 8.5* 8.4* 8.2* 8.1*  MG  --  1.6* 1.4* 1.6*  PHOS  --  4.0  --   --     Liver Function Tests: Recent Labs  Lab 07/09/23 1505 07/10/23 0622 07/12/23 0429  AST 153* 132* 129*  ALT 39 33 30  ALKPHOS 60 52 44   BILITOT 0.6 0.5 0.7  PROT 9.5* 8.7* 8.3*  ALBUMIN 2.3* 2.1* 2.1*   No results for input(s): "LIPASE", "AMYLASE" in the last 168 hours. No results for input(s): "AMMONIA" in the last 168 hours.  CBC: Recent Labs  Lab 07/09/23 1320 07/10/23 0622 07/11/23 0437 07/12/23 0429  WBC 1.9* 3.0* 3.1* 2.8*  NEUTROABS 0.8*  --   --   --   HGB 11.2* 11.1* 10.6* 9.8*  HCT 35.9* 36.2 34.3* 32.0*  MCV 89.1 89.8 89.6 90.1  PLT 252 267 257 233    Cardiac Enzymes: No results for input(s): "CKTOTAL", "CKMB", "CKMBINDEX", "TROPONINI" in the last 168 hours.  BNP: BNP (last 3 results) Recent Labs    07/09/23 1320  BNP 566.0*    ProBNP (last 3 results) No results for input(s): "PROBNP" in the last 8760 hours.   CBG: No results for input(s): "GLUCAP" in the last 168 hours.  Coagulation Studies: No results for input(s): "LABPROT", "INR" in the last 72 hours.   Imaging   CT ABDOMEN PELVIS W CONTRAST Result Date: 07/12/2023 CLINICAL DATA:  Renal mass/cyst, indeterminate EXAM: CT ABDOMEN AND PELVIS WITH CONTRAST TECHNIQUE: Multidetector CT imaging of the abdomen and pelvis was performed using the standard protocol following bolus administration of intravenous contrast. RADIATION DOSE REDUCTION: This exam was performed according to the departmental dose-optimization program which includes automated exposure control, adjustment of the mA and/or kV according to patient size and/or use of iterative reconstruction technique. CONTRAST:  OMNIPAQUE IOHEXOL 300 MG/ML  SOLN COMPARISON:  July 10, 2023, January 14, 2022 FINDINGS: Lower chest: No focal airspace consolidation or pleural effusion. Hepatobiliary: No mass.No radiopaque stones or wall thickening of the gallbladder.No intrahepatic or extrahepatic biliary ductal dilation.The portal veins are patent. Pancreas: No mass or main ductal dilation.No peripancreatic inflammation or fluid collection. Spleen: Normal size. No mass. Adrenals/Urinary Tract:  No adrenal masses. No renal mass. The region of concern  in the upper pole of the left kidney on the prior CT is a region of cortical lobulation of the normal renal cortex. No nephrolithiasis or hydronephrosis. The urinary bladder is distended without focal abnormality. Stomach/Bowel: The stomach is decompressed without focal abnormality. No small bowel wall thickening or inflammation. No small bowel obstruction. Normal appendix. Vascular/Lymphatic: No aortic aneurysm. Diffuse aortoiliac atherosclerosis. No intraabdominal or pelvic lymphadenopathy. Reproductive: Age-related atrophy of the uterus and ovaries. No concerning adnexal mass.No free pelvic fluid. Other: No pneumoperitoneum, ascites, or mesenteric inflammation. Musculoskeletal: No acute fracture or destructive lesion.Multilevel degenerative disc disease of the spine. Bilateral hip osteoarthritis, worst on the right, where there is bone on bone articulation. IMPRESSION: 1. No acute intra-abdominal or pelvic abnormality. 2. The region of concern in the upper pole of the left kidney on the prior CT corresponds to a region of cortical lobulation of the normal renal cortex. No concerning renal mass. Electronically Signed   By: Wallie Char M.D.   On: 07/12/2023 16:46   DG CHEST PORT 1 VIEW Result Date: 07/12/2023 CLINICAL DATA:  Fever, dyspnea EXAM: PORTABLE CHEST 1 VIEW COMPARISON:  July 09, 2023, July 10, 2023 FINDINGS: Low lung volumes. No focal airspace consolidation, pleural effusion, or pneumothorax. Mild cardiomegaly. No acute fracture or destructive lesion. Multilevel thoracic osteophytosis. IMPRESSION: Cardiomegaly.  No pneumonia or pulmonary edema. Electronically Signed   By: Wallie Char M.D.   On: 07/12/2023 16:13     Medications:     Current Medications:  aspirin EC  325 mg Oral Daily   atorvastatin  80 mg Oral Daily   Chlorhexidine Gluconate Cloth  6 each Topical Daily   enoxaparin (LOVENOX) injection  40 mg Subcutaneous Q24H    feeding supplement  237 mL Oral BID BM   levothyroxine  50 mcg Oral QAC breakfast   nicotine  7 mg Transdermal Daily   polyethylene glycol  17 g Oral Daily    Infusions:     Patient Profile   [name] 58 year old with a history of CVA, HTN, HLD, RA, and hypothyroidism.   Admitted with Acute RV Failure.   Assessment/Plan   1. Acute RV Failure  Echo with preserved LVEF and severely reduced RV. Uncleared etiology. Suspect underlying PH. CT of chest w/ enlarged central PA suggesting PAH. CTA negative for PE. BNP 566. Initially diuresed with IV lasix. Brisk diuresis noted. IV lasix stopped 4/8. Appears mildly elevated today.  - restart IV diuretics today  - Plan for RHC tomorrow to further assess  - will need further w/u w/ PFTs, V/Q scan and outpatient sleep study   2. HS Trop mildly elevated - 108>>115>>124>>147>>146 - Suspect demand ischemia - Denies CP but w/ RV dysfunction, would recommend LHC at time of RHC to c/o coronary disease   3. Hypothyroidism  - on Levothyroxine   4. Seropositive RA - on Humira and methotrexate - followed by Dr. Dimple Casey    5. Fever - fever resolved - Leukopenia- in the setting of Humira  - Blood Cultures NGTD    Length of Stay: [address] PA-C  07/13/2023, 9:09 AM  Advanced Heart Failure Team Pager [phone] (M-F; 7a - 5p)  Please contact [facility] for night-coverage after hours (4p -7a ) and weekends on [email or web address]

## 2023-07-12 NOTE — Progress Notes (Signed)
 Per signout from cardmaster who spoke with our cardiology team at [facility], Advanced HF team is aware of patient pending transfer and they will see patient in AM.

## 2023-07-12 NOTE — Plan of Care (Signed)
  Problem: Education: Goal: Knowledge of General Education information will improve Description: Including pain rating scale, medication(s)/side effects and non-pharmacologic comfort measures Outcome: Not Progressing   Problem: Health Behavior/Discharge Planning: Goal: Ability to manage health-related needs will improve Outcome: Not Progressing   Problem: Clinical Measurements: Goal: Ability to maintain clinical measurements within normal limits will improve Outcome: Not Progressing Goal: Will remain free from infection Outcome: Not Progressing Goal: Diagnostic test results will improve Outcome: Not Progressing Goal: Respiratory complications will improve Outcome: Not Progressing Goal: Cardiovascular complication will be avoided Outcome: Not Progressing   Problem: Activity: Goal: Risk for activity intolerance will decrease Outcome: Not Progressing   Problem: Nutrition: Goal: Adequate nutrition will be maintained Outcome: Not Progressing   Problem: Coping: Goal: Level of anxiety will decrease Outcome: Not Progressing   Problem: Elimination: Goal: Will not experience complications related to bowel motility Outcome: Not Progressing Goal: Will not experience complications related to urinary retention Outcome: Not Progressing   Problem: Pain Managment: Goal: General experience of comfort will improve and/or be controlled Outcome: Progressing   Problem: Safety: Goal: Ability to remain free from injury will improve Outcome: Not Progressing   Problem: Skin Integrity: Goal: Risk for impaired skin integrity will decrease Outcome: Not Progressing

## 2023-07-13 DIAGNOSIS — I50811 Acute right heart failure: Secondary | ICD-10-CM

## 2023-07-13 DIAGNOSIS — R7989 Other specified abnormal findings of blood chemistry: Secondary | ICD-10-CM | POA: Diagnosis not present

## 2023-07-13 LAB — BASIC METABOLIC PANEL WITH GFR
Anion gap: 9 (ref 5–15)
BUN: 14 mg/dL (ref 6–20)
CO2: 28 mmol/L (ref 22–32)
Calcium: 8.1 mg/dL — ABNORMAL LOW (ref 8.9–10.3)
Chloride: 95 mmol/L — ABNORMAL LOW (ref 98–111)
Creatinine, Ser: 0.65 mg/dL (ref 0.44–1.00)
GFR, Estimated: >60 mL/min (ref >60)
Glucose, Bld: 100 mg/dL — ABNORMAL HIGH (ref 70–99)
Potassium: 3.7 mmol/L (ref 3.5–5.1)
Sodium: 132 mmol/L — ABNORMAL LOW (ref 135–145)

## 2023-07-13 LAB — CBC
HCT: 36.3 % (ref 36.0–46.0)
Hemoglobin: 11.2 g/dL — ABNORMAL LOW (ref 12.0–15.0)
MCH: 27.5 pg (ref 26.0–34.0)
MCHC: 30.9 g/dL (ref 30.0–36.0)
MCV: 89 fL (ref 80.0–100.0)
Platelets: 246 10*3/uL (ref 150–400)
RBC: 4.08 MIL/uL (ref 3.87–5.11)
RDW: 16.8 % — ABNORMAL HIGH (ref 11.5–15.5)
WBC: 1.5 10*3/uL — ABNORMAL LOW (ref 4.0–10.5)
nRBC: 3.2 % — ABNORMAL HIGH (ref 0.0–0.2)

## 2023-07-13 LAB — MAGNESIUM: Magnesium: 2.1 mg/dL (ref 1.7–2.4)

## 2023-07-13 MED ORDER — POTASSIUM CHLORIDE CRYS ER 20 MEQ PO TBCR
40.0000 meq | EXTENDED_RELEASE_TABLET | Freq: Once | ORAL | Status: AC
  Administered 2023-07-13: 40 meq via ORAL
  Filled 2023-07-13: qty 2

## 2023-07-13 MED ORDER — LEVOTHYROXINE SODIUM 50 MCG PO TABS
50.0000 ug | ORAL_TABLET | Freq: Every day | ORAL | Status: DC
  Administered 2023-07-13 – 2023-07-16 (×4): 50 ug via ORAL
  Filled 2023-07-13 (×4): qty 1

## 2023-07-13 MED ORDER — FUROSEMIDE 10 MG/ML IJ SOLN
60.0000 mg | Freq: Two times a day (BID) | INTRAMUSCULAR | Status: DC
  Administered 2023-07-13 – 2023-07-14 (×2): 60 mg via INTRAVENOUS
  Filled 2023-07-13 (×2): qty 6

## 2023-07-13 NOTE — TOC Initial Note (Signed)
 Transition of Care [facility]) - Initial/Assessment Note    Patient Details  Name: [name] MRN: [id number] Date of Birth: 11-25-1965  Transition of Care [facility]) CM/SW Contact:    Nicanor Bake Phone Number: [phone] 07/13/2023, 3:06 PM  Clinical Narrative:  HF CSW met with patient and husband at bedside. Patient stated that she lives with her husband and minor granddaughter at home. Patient stated that she has no history of HH services. Patient stated that she doe snot use any equipment, but in need of a wide bedside commode. Patient stated that she does not have a scale at home. CSW explained that most insurances will supply her with one if she calls and inquires to see if its included in her plan. Patient stated that she has a PCP. CSW explained that a hospital follow up will be scheduled closer towards dc.   HF CSW notified the HF NCM about the DME equipment requested.   TOC will continue following.         Expected Discharge Plan: Home/Self Care Barriers to Discharge: Continued Medical Work up   Patient Goals and CMS Choice Patient states their goals for this hospitalization and ongoing recovery are:: return home CMS Medicare.gov Compare Post Acute Care list provided to:: Patient Choice offered to / list presented to : Patient      Expected Discharge Plan and Services In-house Referral: Clinical Social Work   Post Acute Care Choice: Durable Medical Equipment Living arrangements for the past 2 months: Single Family Home                 DME Arranged: 3-N-1, [employer] DME Agency: [employer] Date DME Agency Contacted: 07/10/23   Representative spoke with at DME Agency: Ian Malkin            Prior Living Arrangements/Services Living arrangements for the past 2 months: Single Family Home Lives with:: Spouse, Relatives Patient language and need for interpreter reviewed:: Yes Do you feel safe going back to the place where you live?: Yes      Need for Family  Participation in Patient Care: Yes (Comment) Care giver support system in place?: Yes (comment) Current home services: DME Criminal Activity/Legal Involvement Pertinent to Current Situation/Hospitalization: No - Comment as needed  Activities of Daily Living   ADL Screening (condition at time of admission) Independently performs ADLs?: No Does the patient have a NEW difficulty with bathing/dressing/toileting/self-feeding that is expected to last >3 days?: No Does the patient have a NEW difficulty with getting in/out of bed, walking, or climbing stairs that is expected to last >3 days?: No Does the patient have a NEW difficulty with communication that is expected to last >3 days?: No Is the patient deaf or have difficulty hearing?: No Does the patient have difficulty seeing, even when wearing glasses/contacts?: No Does the patient have difficulty concentrating, remembering, or making decisions?: No  Permission Sought/Granted                  Emotional Assessment Appearance:: Appears stated age Attitude/Demeanor/Rapport: Engaged Affect (typically observed): Appropriate Orientation: : Oriented to Self, Oriented to Place, Oriented to  Time, Oriented to Situation Alcohol / Substance Use: Not Applicable Psych Involvement: No (comment)  Admission diagnosis:  Orthopnea [R06.01] Shortness of breath [R06.02] Elevated troponin [R79.89] Acute CHF (congestive heart failure) (HCC) [I50.9] Elevated brain natriuretic peptide (BNP) level [R79.89] Acute respiratory failure with hypoxia (HCC) [J96.01] D-dimer, elevated [R79.89] Neutropenia, unspecified type [facility]) [D70.9] Patient Active Problem List   Diagnosis Date  Noted   Shortness of breath 07/11/2023   Pressure injury of skin 07/11/2023   Elevated brain natriuretic peptide (BNP) level 07/10/2023   Elevated d-dimer 07/10/2023   Elevated troponin 07/10/2023   Leukopenia 07/10/2023   Prolonged QT interval 07/10/2023   Hypoalbuminemia due  to protein-calorie malnutrition (HCC) 07/10/2023   Elevated AST (SGOT) 07/10/2023   Obesity, Class II, BMI 35-39.9 07/10/2023   Acute CHF (congestive heart failure) (HCC) 07/09/2023   Acute respiratory failure with hypoxia (HCC) 01/15/2022   Hypokalemia 01/15/2022   Hyponatremia 01/15/2022   CAP (community acquired pneumonia) 01/14/2022   Sepsis (HCC) 01/14/2022   Pain in right knee 12/14/2021   Mild intermittent asthma without complication 07/07/2021   Normocytic anemia 12/02/2020   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/19/2020   High risk medication use 06/19/2020   Vitamin D deficiency 06/19/2020   Arthritis involving multiple sites 02/18/2018   Mixed hyperlipidemia 02/16/2018   Class 3 severe obesity due to excess calories with serious comorbidity and body mass index (BMI) of 45.0 to 49.9 in adult (HCC) 02/16/2018   Hemiparesis and alteration of sensations as late effects of stroke (HCC) 02/16/2018   Anogenital (venereal) warts 08/04/2017   Screening for colorectal cancer 08/04/2017   Encounter for gynecological examination with Papanicolaou smear of cervix 08/04/2017   Vertigo 08/04/2017   Vertigo as late effect of stroke 08/04/2017   Smoker 06/01/2017   Acquired hypothyroidism 05/12/2017   CVA (cerebral vascular accident) (HCC) 06/25/2013   Tremor of left hand 06/25/2013   Hemiparesis, left (HCC) 06/25/2013   History of CVA with residual deficit 06/25/2013   Difficulty walking 08/31/2011   Unstable balance 08/31/2011   Weakness of left side of body 08/31/2011   Difficulty walking 08/31/2011   Cerebral infarction (HCC) 04/07/2011   Left hand weakness 04/07/2011   Essential hypertension 04/07/2011   PCP:  [facility] Pharmacy:   [facility] [address] [place] - [address] [place] [address] [place] [address] [place] [address] Phone: [phone] Fax: [phone]  [place] -  [facility] [address] [place] [address] Phone: [phone] Fax: [phone]     Social Drivers of Health (SDOH) Social History: SDOH Screenings   Food Insecurity: No Food Insecurity (07/09/2023)  Housing: Low Risk  (07/09/2023)  Transportation Needs: No Transportation Needs (07/09/2023)  Utilities: Not At Risk (07/09/2023)  Alcohol Screen: Low Risk  (08/31/2021)  Depression (PHQ2-9): Low Risk  (08/31/2021)  Financial Resource Strain: Low Risk  (04/07/2023)   Received from [facility]  Physical Activity: Sufficiently Active (08/31/2021)  Recent Concern: Physical Activity - Inactive (07/07/2021)   Received from [facility], [facility]  Social Connections: Unknown (07/08/2022)   Received from [facility], [facility]  Stress: No Stress Concern Present (08/31/2021)  Tobacco Use: High Risk (07/12/2023)   SDOH Interventions:     Readmission Risk Interventions     No data to display

## 2023-07-13 NOTE — Plan of Care (Signed)
   Problem: Education: Goal: Knowledge of General Education information will improve Description Including pain rating scale, medication(s)/side effects and non-pharmacologic comfort measures Outcome: Progressing

## 2023-07-13 NOTE — Progress Notes (Signed)
 Progress Note   Patient: [name] [id number] DOB: 10-Oct-1965 DOA: 07/09/2023     4 DOS: the patient was seen and examined on 07/13/2023   Brief hospital course: 58 y.o. female with medical history significant of hypertension, hyperlipidemia, acquired hypothyroidism, reportedly no longer on levothyroxine, CVA with residual left-sided weakness, tobacco use disorder who presented to the emergency department from home via EMS due to 2-week onset of increased shortness of breath with difficulty in being able to sleep at night, so she has been taking Benadryl with subsequent stomach upset.  Family thought that her shortness of breath was the cause of difficulty to sleep at night.  Patient also endorsed increased weakness and have sustained 2 falls during transfer due to weakness.   Patient is bed bound -wheelchair bound at baseline.   Patient was admitted with acute CHF with noted elevated troponin levels and suspected PE with elevated D-dimer.  CT angiogram did not demonstrate PE.  She continued to have symptoms of heart failure requiring IV diuresis and cardiology asked to consult to assist with management.  2D echocardiogram with LVEF 50-55% and signs of RV failure.  Cardiology team assessed and felt she required transfer to [facility] for consultation with the advanced heart failure service and likely RHC.   Assessment and Plan:  Acute HFpEF  RV Heart Failure  [facility] for advanced heart failure consultation. Patient has been seen by the advanced heart failure team. -Continue IV Lasix. - For RHC tomorrow.   Acute respiratory failure with hypoxia  - wean to room air oxygen as able  - not on home oxygen    Leukopenia  - secondary to methotrexate therapy  - Will hold home methotrexate in the setting of recent fevers.  Hypertension Patient is on Norvasc, hydralazine at home. - Holding home medications due to relative hypotension.    History of CVA - resumed home  atorvastatin, aspirin.   Elevated D dimer - CTA chest negative for PE    Concern for 1.3 cm left renal mass This was initially noted on CTA of the chest. Follow-up CT abdomen revealed that this is not an actual mass but corresponds to region of cortical lobulation of the normal renal cortex. - No further evaluation indicated.   Fever  - temp up to 101.5 on 07/12/23 - UA, chest x-ray, abdominal CT not suggestive of infection. -Follow-up blood cultures.      Subjective: Patient has no new complaints.  Physical Exam: Vitals:   07/13/23 0200 07/13/23 0350 07/13/23 0715 07/13/23 1331  BP:  106/67 101/72 93/64  Pulse: 83 87 83   Resp: (!) 22 (!) 31 (!) 25   Temp:  97.9 F (36.6 C) 97.9 F (36.6 C) 97.6 F (36.4 C)  TempSrc:  Oral Oral Oral  SpO2: 97% 94% 100%   Weight:  103.6 kg    Height:        General: Alert, oriented X3  Eyes: Pupils equal, reactive  Oral cavity: moist mucous membranes  Head: Atraumatic, normocephalic  Neck: supple  Chest: clear to auscultation. No crackles, no wheezes  CVS: S1,S2 RRR. No murmurs  Abd: No distention, soft, non-tender. No masses palpable  Extr: trace  edema   MSK: No joint deformities or swelling  Neurological: Grossly intact.     Data Reviewed:     Latest Ref Rng & Units 07/13/2023    8:29 AM 07/12/2023    4:29 AM 07/11/2023    4:37 AM  CBC  WBC 4.0 - 10.5 K/uL 1.5  2.8  3.1   Hemoglobin 12.0 - 15.0 g/dL 40.9  9.8  81.1   Hematocrit 36.0 - 46.0 % 36.3  32.0  34.3   Platelets 150 - 400 K/uL 246  233  257       Latest Ref Rng & Units 07/13/2023    8:29 AM 07/12/2023    4:29 AM 07/11/2023    4:37 AM  BMP  Glucose 70 - 99 mg/dL 914  81  82   BUN 6 - 20 mg/dL 14  17  17    Creatinine 0.44 - 1.00 mg/dL 7.82  9.56  2.13   Sodium 135 - 145 mmol/L 132  134  135   Potassium 3.5 - 5.1 mmol/L 3.7  4.0  3.9   Chloride 98 - 111 mmol/L 95  98  99   CO2 22 - 32 mmol/L 28  30  30    Calcium 8.9 - 10.3 mg/dL 8.1  8.1  8.2      Family  Communication: Spoke with husband at bedside.  Disposition: Status is: Inpatient Remains inpatient appropriate because: Requires further heart failure management.  Planned Discharge Destination: Home    Time spent: 40 minutes  Author: Marcine Matar, MD 07/13/2023 4:50 PM  For on call review [email or web address].

## 2023-07-13 NOTE — Progress Notes (Signed)
 Spoke to [place] with care link to set up transportation for transfer to [facility]. Per Mora Bellman, care link will be arriving some time tonight.

## 2023-07-14 ENCOUNTER — Inpatient Hospital Stay (HOSPITAL_COMMUNITY)

## 2023-07-14 ENCOUNTER — Encounter (HOSPITAL_COMMUNITY)
Admission: EM | Disposition: A | Discharge status: Home / Self Care | Payer: Self-pay | Source: Home / Self Care | Attending: Internal Medicine

## 2023-07-14 ENCOUNTER — Telehealth (HOSPITAL_COMMUNITY): Payer: Self-pay | Admitting: Pharmacy Technician

## 2023-07-14 ENCOUNTER — Other Ambulatory Visit (HOSPITAL_COMMUNITY): Payer: Self-pay

## 2023-07-14 ENCOUNTER — Encounter (HOSPITAL_COMMUNITY): Payer: Self-pay | Admitting: Cardiology

## 2023-07-14 DIAGNOSIS — I50811 Acute right heart failure: Secondary | ICD-10-CM | POA: Diagnosis not present

## 2023-07-14 DIAGNOSIS — I509 Heart failure, unspecified: Secondary | ICD-10-CM | POA: Diagnosis not present

## 2023-07-14 DIAGNOSIS — I272 Pulmonary hypertension, unspecified: Secondary | ICD-10-CM

## 2023-07-14 HISTORY — PX: RIGHT HEART CATH: CATH118263

## 2023-07-14 LAB — POCT I-STAT EG7
Acid-Base Excess: 9 mmol/L — ABNORMAL HIGH (ref 0.0–2.0)
Acid-Base Excess: 9 mmol/L — ABNORMAL HIGH (ref 0.0–2.0)
Bicarbonate: 35.4 mmol/L — ABNORMAL HIGH (ref 20.0–28.0)
Bicarbonate: 35.5 mmol/L — ABNORMAL HIGH (ref 20.0–28.0)
Calcium, Ion: 1.09 mmol/L — ABNORMAL LOW (ref 1.15–1.40)
Calcium, Ion: 1.1 mmol/L — ABNORMAL LOW (ref 1.15–1.40)
HCT: 38 % (ref 36.0–46.0)
HCT: 38 % (ref 36.0–46.0)
Hemoglobin: 12.9 g/dL (ref 12.0–15.0)
Hemoglobin: 12.9 g/dL (ref 12.0–15.0)
O2 Saturation: 56 %
O2 Saturation: 58 %
Potassium: 4.2 mmol/L (ref 3.5–5.1)
Potassium: 4.3 mmol/L (ref 3.5–5.1)
Sodium: 138 mmol/L (ref 135–145)
Sodium: 138 mmol/L (ref 135–145)
TCO2: 37 mmol/L — ABNORMAL HIGH (ref 22–32)
TCO2: 37 mmol/L — ABNORMAL HIGH (ref 22–32)
pCO2, Ven: 53.6 mmHg (ref 44–60)
pCO2, Ven: 54.7 mmHg (ref 44–60)
pH, Ven: 7.419 (ref 7.25–7.43)
pH, Ven: 7.429 (ref 7.25–7.43)
pO2, Ven: 30 mmHg — CL (ref 32–45)
pO2, Ven: 30 mmHg — CL (ref 32–45)

## 2023-07-14 LAB — CBC
HCT: 37.4 % (ref 36.0–46.0)
Hemoglobin: 11.3 g/dL — ABNORMAL LOW (ref 12.0–15.0)
MCH: 26.8 pg (ref 26.0–34.0)
MCHC: 30.2 g/dL (ref 30.0–36.0)
MCV: 88.8 fL (ref 80.0–100.0)
Platelets: 212 10*3/uL (ref 150–400)
RBC: 4.21 MIL/uL (ref 3.87–5.11)
RDW: 16.8 % — ABNORMAL HIGH (ref 11.5–15.5)
WBC: 2.2 10*3/uL — ABNORMAL LOW (ref 4.0–10.5)
nRBC: 1.4 % — ABNORMAL HIGH (ref 0.0–0.2)

## 2023-07-14 LAB — CULTURE, BLOOD (ROUTINE X 2): Special Requests: ADEQUATE

## 2023-07-14 LAB — BASIC METABOLIC PANEL WITH GFR
Anion gap: 5 (ref 5–15)
BUN: 13 mg/dL (ref 6–20)
CO2: 30 mmol/L (ref 22–32)
Calcium: 8.1 mg/dL — ABNORMAL LOW (ref 8.9–10.3)
Chloride: 98 mmol/L (ref 98–111)
Creatinine, Ser: 0.65 mg/dL (ref 0.44–1.00)
GFR, Estimated: >60 mL/min (ref >60)
Glucose, Bld: 80 mg/dL (ref 70–99)
Potassium: 4.2 mmol/L (ref 3.5–5.1)
Sodium: 133 mmol/L — ABNORMAL LOW (ref 135–145)

## 2023-07-14 SURGERY — RIGHT HEART CATH
Anesthesia: LOCAL

## 2023-07-14 MED ORDER — TECHNETIUM TO 99M ALBUMIN AGGREGATED
4.0000 | Freq: Once | INTRAVENOUS | Status: AC | PRN
  Administered 2023-07-14: 4 via INTRAVENOUS

## 2023-07-14 MED ORDER — SILDENAFIL CITRATE 20 MG PO TABS
20.0000 mg | ORAL_TABLET | Freq: Three times a day (TID) | ORAL | Status: DC
  Administered 2023-07-14 – 2023-07-16 (×6): 20 mg via ORAL
  Filled 2023-07-14 (×8): qty 1

## 2023-07-14 MED ORDER — SODIUM CHLORIDE 0.9 % IV SOLN: INTRAVENOUS | Status: DC

## 2023-07-14 MED ORDER — HEPARIN (PORCINE) IN NACL 1000-0.9 UT/500ML-% IV SOLN
INTRAVENOUS | Status: DC | PRN
  Administered 2023-07-14: 500 mL

## 2023-07-14 MED ORDER — MIDAZOLAM HCL 2 MG/2ML IJ SOLN
INTRAMUSCULAR | Status: DC | PRN
  Administered 2023-07-14: 1 mg via INTRAVENOUS

## 2023-07-14 MED ORDER — LIDOCAINE HCL (PF) 1 % IJ SOLN
INTRAMUSCULAR | Status: AC
  Filled 2023-07-14: qty 30

## 2023-07-14 MED ORDER — LIDOCAINE HCL (PF) 1 % IJ SOLN
INTRAMUSCULAR | Status: DC | PRN
  Administered 2023-07-14 (×2): 2 mL via INTRADERMAL

## 2023-07-14 MED ORDER — FENTANYL CITRATE (PF) 100 MCG/2ML IJ SOLN: INTRAMUSCULAR | Status: DC | PRN

## 2023-07-14 MED ORDER — MEDIHONEY WOUND/BURN DRESSING EX PSTE
1.0000 | PASTE | Freq: Every day | CUTANEOUS | Status: DC
  Administered 2023-07-14 – 2023-07-16 (×3): 1 via TOPICAL
  Filled 2023-07-14: qty 44

## 2023-07-14 MED ORDER — MIDAZOLAM HCL 2 MG/2ML IJ SOLN
INTRAMUSCULAR | Status: AC
  Filled 2023-07-14: qty 2

## 2023-07-14 SURGICAL SUPPLY — 9 items
CATH SWAN GANZ 7F STRAIGHT (CATHETERS) IMPLANT
GLIDESHEATH SLENDER 7FR .021G (SHEATH) IMPLANT
GUIDEWIRE .025 260CM (WIRE) IMPLANT
KIT MICROPUNCTURE NIT STIFF (SHEATH) IMPLANT
PACK CARDIAC CATHETERIZATION (CUSTOM PROCEDURE TRAY) ×1 IMPLANT
SHEATH PROBE COVER 6X72 (BAG) IMPLANT
TRANSDUCER W/STOPCOCK (MISCELLANEOUS) IMPLANT
TUBING ART PRESS 72 MALE/FEM (TUBING) IMPLANT
WIRE MICROINTRODUCER 60CM (WIRE) IMPLANT

## 2023-07-14 NOTE — Progress Notes (Signed)
 Advanced Heart Failure Rounding Note  Cardiologist: Dietrich Pates, MD  Chief Complaint: SOB, Acute RV Failure Subjective:    1.5L in UOP yesterday w/ IV Lasix. SCr stable 0.65. K 4.2. Scheduled for RHC toda.  Laying in bed. Denies dyspnea. No current complaints. Husband at bedside.    Objective:   Weight Range: 103.4 kg Body mass index is 36.79 kg/m.   Vital Signs:   Temp:  [97.6 F (36.4 C)-98.5 F (36.9 C)] 97.9 F (36.6 C) (04/11 0424) Pulse Rate:  [83-99] 95 (04/11 0425) Resp:  [19-27] 19 (04/11 0018) BP: (92-104)/(59-73) 101/69 (04/11 0424) SpO2:  [87 %-100 %] 92 % (04/11 0425) Weight:  [103.4 kg] 103.4 kg (04/11 0424) Last BM Date : 07/09/23  Weight change: Filed Weights   07/11/23 0500 07/13/23 0350 07/14/23 0424  Weight: 104.1 kg 103.6 kg 103.4 kg    Intake/Output:   Intake/Output Summary (Last 24 hours) at 07/14/2023 0714 Last data filed at 07/14/2023 0447 Gross per 24 hour  Intake 480 ml  Output 1500 ml  Net -1020 ml      Physical Exam    General:  obese, chronically ill appearing No resp difficulty HEENT: Normal Neck: Supple. Thick neck JVP not well visualized . Carotids 2+ bilat; no bruits. No lymphadenopathy or thyromegaly appreciated. Cor: PMI nondisplaced. Regular rate & rhythm.  Lungs: decreased BS at the bases  Abdomen: obese, soft, nontender, nondistended.  Good bowel sounds. Extremities: No cyanosis, clubbing, rash, edema Neuro: Alert & orientedx3, cranial nerves grossly intact. moves all 4 extremities w/o difficulty. Affect pleasant   Telemetry   NSR 90s, personally reviewed   EKG    N/A   Labs    CBC Recent Labs    07/13/23 0829 07/14/23 0249  WBC 1.5* 2.2*  HGB 11.2* 11.3*  HCT 36.3 37.4  MCV 89.0 88.8  PLT 246 212   Basic Metabolic Panel Recent Labs    11/91/47 0429 07/13/23 0829 07/14/23 0249  NA 134* 132* 133*  K 4.0 3.7 4.2  CL 98 95* 98  CO2 30 28 30   GLUCOSE 81 100* 80  BUN 17 14 13   CREATININE  0.76 0.65 0.65  CALCIUM 8.1* 8.1* 8.1*  MG 1.6* 2.1  --    Liver Function Tests Recent Labs    07/12/23 0429  AST 129*  ALT 30  ALKPHOS 44  BILITOT 0.7  PROT 8.3*  ALBUMIN 2.1*   No results for input(s): "LIPASE", "AMYLASE" in the last 72 hours. Cardiac Enzymes No results for input(s): "CKTOTAL", "CKMB", "CKMBINDEX", "TROPONINI" in the last 72 hours.  BNP: BNP (last 3 results) Recent Labs    07/09/23 1320  BNP 566.0*    ProBNP (last 3 results) No results for input(s): "PROBNP" in the last 8760 hours.   D-Dimer No results for input(s): "DDIMER" in the last 72 hours. Hemoglobin A1C No results for input(s): "HGBA1C" in the last 72 hours. Fasting Lipid Panel No results for input(s): "CHOL", "HDL", "LDLCALC", "TRIG", "CHOLHDL", "LDLDIRECT" in the last 72 hours. Thyroid Function Tests Recent Labs    07/12/23 0429  TSH 5.595*    Other results:   Imaging    No results found.   Medications:     Scheduled Medications:  aspirin EC  325 mg Oral Daily   atorvastatin  80 mg Oral Daily   Chlorhexidine Gluconate Cloth  6 each Topical Daily   enoxaparin (LOVENOX) injection  40 mg Subcutaneous Q24H   feeding supplement  237 mL  Oral BID BM   furosemide  60 mg Intravenous BID   levothyroxine  50 mcg Oral QAC breakfast   nicotine  7 mg Transdermal Daily   polyethylene glycol  17 g Oral Daily    Infusions:  sodium chloride 10 mL/hr at 07/14/23 0545    PRN Medications: acetaminophen **OR** acetaminophen, melatonin    Patient Profile   58 YO AAF w/ HTN, HLD, RA, hypothyroidism, hx of 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 On arrival, CT chest negative for PE. TTE w/ preserved LV function & dilated RV with severely reduced function; RVSP . Patient transferred here for RHC and evaluation for PH.   Assessment/Plan   1. Acute RV Failure/ PH   - Echo with  preserved LVEF and severely reduced RV. Uncleared etiology. Suspect underlying PH. CT of chest w/ enlarged central PA suggesting PAH. CTA negative for PE. BNP 566.  - net negative 4.7L thus far w/ IV Lasix. Exam difficult due to body habitus - plan RHC today  - will need further w/u w/ PFTs, V/Q scan and outpatient sleep study    2. HS Trop mildly elevated - 108>>115>>124>>147>>146 - Suspect demand ischemia - Denies CP    3. Hypothyroidism  - on Levothyroxine    4. Seropositive RA - on Humira and methotrexate - followed by Dr. Dimple Casey     5. Fever - fever resolved - Leukopenia- in the setting of Humira  - Blood Cultures NGTD       Length of Stay: 5  Desree Leap, PA-C  07/14/2023, 7:14 AM  Advanced Heart Failure Team Pager [phone] (M-F; 7a - 5p)  Please contact [facility] for night-coverage after hours (5p -7a ) and weekends on [email or web address]

## 2023-07-14 NOTE — Progress Notes (Signed)
 Progress Note   Patient: [name] [id number]:[id number] DOB: [birth year] DOA: 07/09/2023     5 DOS: the patient was seen and examined on 07/14/2023   Brief hospital course: 58 y.o. woman with PMH of hypertension, hyperlipidemia, acquired hypothyroidism, reportedly no longer on levothyroxine, CVA with residual left-sided weakness, tobacco use disorder who presented to the emergency department from home via EMS due to 2-week onset of increased shortness of breath with difficulty in being able to sleep at night, so she has been taking Benadryl with subsequent stomach upset.  Family thought that her shortness of breath was the cause of difficulty to sleep at night.  Patient also endorsed increased weakness and have sustained 2 falls during transfer due to weakness.   Patient is bed bound -wheelchair bound at baseline.   Patient was admitted with acute CHF with noted elevated troponin levels and suspected PE with elevated D-dimer.  CT angiogram did not demonstrate PE.  She continued to have symptoms of heart failure requiring IV diuresis and cardiology asked to consult to assist with management.  2D echocardiogram with LVEF 50-55% and signs of RV failure.  Cardiology team assessed and felt she required transfer to [facility] for consultation with the advanced heart failure service and likely RHC.   Assessment and Plan:  Acute HFpEF  RV Heart Failure  [facility] for advanced heart failure consultation. Patient has been seen by the advanced heart failure team. -Continue IV Lasix. - For RHC today.    Acute respiratory failure with hypoxia  - wean to room air oxygen as able  - not on home oxygen    Leukopenia  - secondary to methotrexate therapy  - Will hold home methotrexate in the setting of recent fevers.  Hypertension Patient is on Norvasc, hydralazine at home. - Holding home medications due to relative hypotension.    History of CVA - resumed home atorvastatin, aspirin.    Elevated D dimer - CTA chest negative for PE    Concern for 1.3 cm left renal mass This was initially noted on CTA of the chest. Follow-up CT abdomen revealed that this is not an actual mass but corresponds to region of cortical lobulation of the normal renal cortex. - No further evaluation indicated.   Fever  - temp up to 101.5 on 07/12/23 - UA, chest x-ray, abdominal CT not suggestive of infection. -Follow-up blood cultures.      Subjective: Patient has no new complaints.  Physical Exam: Vitals:   07/14/23 1147 07/14/23 1152 07/14/23 1157 07/14/23 1214  BP: 122/75 122/75  96/65  Pulse: (!) 102 (!) 103 (!) 0 98  Resp: (!) 41 20  20  Temp:    97.6 F (36.4 C)  TempSrc:    Oral  SpO2: 94%     Weight:      Height:        General: Alert, oriented X3  Eyes: Pupils equal, reactive  Oral cavity: moist mucous membranes  Head: Atraumatic, normocephalic  Neck: supple  Chest: clear to auscultation. No crackles, no wheezes  CVS: S1,S2 RRR. No murmurs  Abd: No distention, soft, non-tender. No masses palpable  Extr: trace  edema   MSK: No joint deformities or swelling  Neurological: Grossly intact.     Data Reviewed:     Latest Ref Rng & Units 07/14/2023    2:49 AM 07/13/2023    8:29 AM 07/12/2023    4:29 AM  CBC  WBC 4.0 - 10.5 K/uL 2.2  1.5  2.8   Hemoglobin 12.0 - 15.0 g/dL 16.1  09.6  9.8   Hematocrit 36.0 - 46.0 % 37.4  36.3  32.0   Platelets 150 - 400 K/uL 212  246  233       Latest Ref Rng & Units 07/14/2023    2:49 AM 07/13/2023    8:29 AM 07/12/2023    4:29 AM  BMP  Glucose 70 - 99 mg/dL 80  045  81   BUN 6 - 20 mg/dL 13  14  17    Creatinine 0.44 - 1.00 mg/dL 4.09  8.11  9.14   Sodium 135 - 145 mmol/L 133  132  134   Potassium 3.5 - 5.1 mmol/L 4.2  3.7  4.0   Chloride 98 - 111 mmol/L 98  95  98   CO2 22 - 32 mmol/L 30  28  30    Calcium 8.9 - 10.3 mg/dL 8.1  8.1  8.1      Family Communication: Spoke with husband at bedside.  Disposition: Status is:  Inpatient Remains inpatient appropriate because: Requires further heart failure management.  Planned Discharge Destination: Home    Time spent: 40 minutes  Author: MDALA-GAUSI, Gwenette Greet, MD 07/14/2023 1:10 PM  For on call review [email or web address].

## 2023-07-14 NOTE — Telephone Encounter (Signed)
 Patient Product/process development scientist completed.    The patient is insured through [employer] [facility].     Ran test claim for sildenafil 20 mg and the current 30 day co-pay is $4.00.   This test claim was processed through [facility]- copay amounts may vary at other pharmacies due to pharmacy/plan contracts, or as the patient moves through the different stages of their insurance plan.     [name], CPHT Pharmacy Technician III Certified Patient Advocate [facility] Patient Advocate Team Direct Number: [phone]  Fax: [phone]

## 2023-07-14 NOTE — Plan of Care (Signed)
  Problem: Health Behavior/Discharge Planning: Goal: Ability to manage health-related needs will improve Outcome: Progressing   Problem: Clinical Measurements: Goal: Ability to maintain clinical measurements within normal limits will improve Outcome: Progressing   Problem: Safety: Goal: Ability to remain free from injury will improve Outcome: Progressing   Problem: Skin Integrity: Goal: Risk for impaired skin integrity will decrease Outcome: Progressing   

## 2023-07-14 NOTE — Consult Note (Addendum)
 WOC Nurse Consult Note: Reason for Consult: stage 2 on admit  Wound type: Stage 3 Pressure Injury Buttocks/coccyx  Pressure Injury POA:  charted as Stage 2 on admit, has progressed to Stage 3 with necrotic tissue present  Measurement: see nursing flowsheet  Wound UVO:[id number] 50% pink 50% yellow  Drainage (amount, consistency, odor) see nursing flowsheet  Periwound: intact  Dressing procedure/placement/frequency: Cleanse coccyx/buttocks wounds with Cleanse coccyx/buttocks wounds with Vashe wound cleanser Hart Rochester [phone]), do not rinse and allow to air dry.  Apply Medihoney to wound beds daily, cover with dry gauze and silicone foam. Vashe wound cleanser Hart Rochester [phone]), do not rinse and allow to air dry.  Apply Medihoney to wound beds daily, cover with dry gauze and silicone foam.   POC discussed with bedside nurse. WOC team will follow every 7 to 10 days to assess wounds and change POC as needed.   Thank you,    Priscella Mann MSN, RN-BC, [employer] [phone]

## 2023-07-14 NOTE — Consult Note (Signed)
 WOC consulted for coccyx wound present on admission 4/6.  No photos in chart regarding this wound.   Please note that the [facility] nursing team is utilizing a standardized work plan to manage patient consults. We are triaging consults and will try to see the patients within 48 hours. Wound photos in the patient's chart allow [place] to consult on the patient in the most efficient and timely manner.    Thank you,    Priscella Mann MSN, RN-BC, [employer] [phone]

## 2023-07-14 NOTE — Progress Notes (Signed)
   07/13/23 2228  BiPAP/CPAP/SIPAP  $ Non-Invasive Ventilator  Non-Invasive Vent Set Up;Non-Invasive Vent Initial  $ Face Mask Medium Yes  BiPAP/CPAP/SIPAP Pt Type Adult  BiPAP/CPAP/SIPAP DREAMSTATIOND  Mask Type Full face mask  Dentures removed? Not applicable  Mask Size Medium  Respiratory Rate 26 breaths/min  EPAP 6 cmH2O  Flow Rate 5 lpm (5L O2 bleed in)  Patient Home Machine No  Patient Home Mask No  Patient Home Tubing No  Auto Titrate No  CPAP/SIPAP surface wiped down Yes  Device Plugged into RED Power Outlet Yes  BiPAP/CPAP /SiPAP Vitals  Pulse Rate 89  Resp 20  SpO2 99 %  Bilateral Breath Sounds Clear;Diminished  MEWS Score/Color  MEWS Score 0  MEWS Score Color [place] Si

## 2023-07-14 NOTE — Interval H&P Note (Signed)
 History and Physical Interval Note:  07/14/2023 11:08 AM  [name]  has presented today for surgery, with the diagnosis of heart failure - hp.  The various methods of treatment have been discussed with the patient and family. After consideration of risks, benefits and other options for treatment, the patient has consented to  Procedure(s): RIGHT HEART CATH (N/A) as a surgical intervention.  The patient's history has been reviewed, patient examined, no change in status, stable for surgery.  I have reviewed the patient's chart and labs.  Questions were answered to the patient's satisfaction.     Karo Rog

## 2023-07-15 DIAGNOSIS — I50811 Acute right heart failure: Secondary | ICD-10-CM | POA: Diagnosis not present

## 2023-07-15 DIAGNOSIS — R7989 Other specified abnormal findings of blood chemistry: Secondary | ICD-10-CM | POA: Diagnosis not present

## 2023-07-15 LAB — BASIC METABOLIC PANEL WITH GFR
Anion gap: 5 (ref 5–15)
BUN: 13 mg/dL (ref 6–20)
CO2: 31 mmol/L (ref 22–32)
Calcium: 8.1 mg/dL — ABNORMAL LOW (ref 8.9–10.3)
Chloride: 96 mmol/L — ABNORMAL LOW (ref 98–111)
Creatinine, Ser: 0.66 mg/dL (ref 0.44–1.00)
GFR, Estimated: >60 mL/min (ref >60)
Glucose, Bld: 87 mg/dL (ref 70–99)
Potassium: 3.9 mmol/L (ref 3.5–5.1)
Sodium: 132 mmol/L — ABNORMAL LOW (ref 135–145)

## 2023-07-15 LAB — CBC
HCT: 39.1 % (ref 36.0–46.0)
Hemoglobin: 12.1 g/dL (ref 12.0–15.0)
MCH: 27 pg (ref 26.0–34.0)
MCHC: 30.9 g/dL (ref 30.0–36.0)
MCV: 87.3 fL (ref 80.0–100.0)
Platelets: 215 10*3/uL (ref 150–400)
RBC: 4.48 MIL/uL (ref 3.87–5.11)
RDW: 16.6 % — ABNORMAL HIGH (ref 11.5–15.5)
WBC: 1.9 10*3/uL — ABNORMAL LOW (ref 4.0–10.5)
nRBC: 1.6 % — ABNORMAL HIGH (ref 0.0–0.2)

## 2023-07-15 NOTE — Progress Notes (Signed)
 Mobility Specialist Progress Note:   07/15/23 1105  Mobility  Activity Ambulated with assistance in room  Level of Assistance Modified independent, requires aide device or extra time  Assistive Device Other (Comment) (furniture)  Distance Ambulated (ft) 20 ft  Activity Response Tolerated well  Mobility visit 1 Mobility  Mobility Specialist Start Time (ACUTE ONLY) 1105  Mobility Specialist Stop Time (ACUTE ONLY) 1108  Mobility Specialist Time Calculation (min) (ACUTE ONLY) 3 min   Responded to chair alarm. Pt ambulating in room with ModI using furniture for support. Assisted pt back to chair, sitting up with all needs met, call bell in reach, alarm on. RN notified.    Daesia Zylka Mobility Specialist Please contact via Special educational needs teacher or  Rehab office at [phone]

## 2023-07-15 NOTE — Plan of Care (Signed)

## 2023-07-15 NOTE — Plan of Care (Signed)
   Problem: Education: Goal: Knowledge of General Education information will improve Description Including pain rating scale, medication(s)/side effects and non-pharmacologic comfort measures Outcome: Progressing

## 2023-07-15 NOTE — Progress Notes (Addendum)
 Progress Note   Patient: [name] ZOX:[id number] DOB: 23-Jun-1965 DOA: 07/09/2023     6 DOS: the patient was seen and examined on 07/15/2023   Brief hospital course: 58 y.o. woman with PMH of hypertension, hyperlipidemia, acquired hypothyroidism, reportedly no longer on levothyroxine, CVA with residual left-sided weakness, tobacco use disorder who presented to the emergency department from home via EMS due to 2-week onset of increased shortness of breath with difficulty in being able to sleep at night, so she has been taking Benadryl with subsequent stomach upset.  Family thought that her shortness of breath was the cause of difficulty to sleep at night.  Patient also endorsed increased weakness and have sustained 2 falls during transfer due to weakness.   Patient is bed bound -wheelchair bound at baseline.   Patient was admitted with acute CHF with noted elevated troponin levels and suspected PE with elevated D-dimer.  CT angiogram did not demonstrate PE.  She continued to have symptoms of heart failure requiring IV diuresis and cardiology asked to consult to assist with management.  2D echocardiogram with LVEF 50-55% and signs of RV failure.  Cardiology team assessed and felt she required transfer to [facility] for consultation with the advanced heart failure service and likely RHC.   Assessment and Plan:  Acute HFpEF  RV Heart Failure  Transferred [facility] for advanced heart failure consultation. Patient has been seen by the advanced heart failure team. She underwent RHC on 4/11 and it revealed severely elevated PVR.  Patient was started on sildenafil. VQ scan negative.  PFTs pending.  - DC IV Lasix per cardiology. - Possible discharge home in a.m.   Acute respiratory failure with hypoxia  - wean to room air oxygen as able  - not on home oxygen    Leukopenia  - secondary to methotrexate therapy  - Will hold home methotrexate in the setting of recent  fevers.  Hypertension Patient is on Norvasc, hydralazine at home. - Holding home medications due to relative hypotension.    History of CVA - resumed home atorvastatin, aspirin.   Elevated D dimer - CTA chest negative for PE  - VQ also negative.    Concern for 1.3 cm left renal mass This was initially noted on CTA of the chest. Follow-up CT abdomen revealed that this is not an actual mass but corresponds to region of cortical lobulation of the normal renal cortex. - No further evaluation indicated.   Fever  - temp up to 101.5 on 07/12/23 - UA, chest x-ray, abdominal CT not suggestive of infection. -Follow-up blood cultures.      Subjective: Patient had right heart cath yesterday.  She is feeling well today.  Physical Exam: Vitals:   07/15/23 0415 07/15/23 0803 07/15/23 1441 07/15/23 1500  BP: 109/70 91/60  (!) 109/97  Pulse: 97 98 100 100  Resp: 18 (!) 27 (!) 30 (!) 23  Temp: 98.2 F (36.8 C) 98.4 F (36.9 C) 99 F (37.2 C) 99 F (37.2 C)  TempSrc: Oral Oral Axillary Axillary  SpO2: 98% 97% 94% 92%  Weight: 101 kg     Height:        General: Alert, oriented X3  Eyes: Pupils equal, reactive  Oral cavity: moist mucous membranes  Head: Atraumatic, normocephalic  Neck: supple  Chest: clear to auscultation. No crackles, no wheezes  CVS: S1,S2 RRR. No murmurs  Abd: No distention, soft, non-tender. No masses palpable  Extr: trace  edema   MSK: No  joint deformities or swelling  Neurological: Left sided weakness   Data Reviewed:     Latest Ref Rng & Units 07/15/2023    4:10 AM 07/14/2023   11:42 AM 07/14/2023    2:49 AM  CBC  WBC 4.0 - 10.5 K/uL 1.9   2.2   Hemoglobin 12.0 - 15.0 g/dL 16.1  09.6    04.5  40.9   Hematocrit 36.0 - 46.0 % 39.1  38.0    38.0  37.4   Platelets 150 - 400 K/uL 215   212       Latest Ref Rng & Units 07/15/2023    4:10 AM 07/14/2023   11:42 AM 07/14/2023    2:49 AM  BMP  Glucose 70 - 99 mg/dL 87   80   BUN 6 - 20 mg/dL 13   13    Creatinine 8.11 - 1.00 mg/dL 9.14   7.82   Sodium 956 - 145 mmol/L 132  138    138  133   Potassium 3.5 - 5.1 mmol/L 3.9  4.2    4.3  4.2   Chloride 98 - 111 mmol/L 96   98   CO2 22 - 32 mmol/L 31   30   Calcium 8.9 - 10.3 mg/dL 8.1   8.1      Family Communication: Spoke with husband at bedside.  Disposition: Status is: Inpatient Remains inpatient appropriate because: Requires further heart failure management.  Planned Discharge Destination: Home    Time spent: 40 minutes  Author: MDALA-GAUSI, Shantel Helwig AGATHA, MD 07/15/2023 5:33 PM  For on call review [email or web address].

## 2023-07-15 NOTE — Progress Notes (Signed)
 Advanced Heart Failure Rounding Note  Cardiologist: Ola Berger, MD  Chief Complaint: SOB, Acute RV Failure Subjective:    - RHC yesterday with severely elevated PVR; discussed with family. Started on sildenafil.  - 1.5L urine output q24h. sCr stable at 0.66.   Objective:   Weight Range: 101 kg Body mass index is 35.94 kg/m.   Vital Signs:   Temp:  [97.6 F (36.4 C)-98.5 F (36.9 C)] 98.4 F (36.9 C) (04/12 0803) Pulse Rate:  [0-103] 98 (04/12 0803) Resp:  [18-42] 27 (04/12 0803) BP: (91-124)/(59-88) 91/60 (04/12 0803) SpO2:  [72 %-100 %] 97 % (04/12 0803) Weight:  [101 kg] 101 kg (04/12 0415) Last BM Date : 07/13/23  Weight change: Filed Weights   07/13/23 0350 07/14/23 0424 07/15/23 0415  Weight: 103.6 kg 103.4 kg 101 kg    Intake/Output:   Intake/Output Summary (Last 24 hours) at 07/15/2023 1112 Last data filed at 07/15/2023 0648 Gross per 24 hour  Intake 100 ml  Output 760 ml  Net -660 ml      Physical Exam    Vitals:   07/15/23 0415 07/15/23 0803  BP: 109/70 91/60  Pulse: 97 98  Resp: 18 (!) 27  Temp: 98.2 F (36.8 C) 98.4 F (36.9 C)  SpO2: 98% 97%   GENERAL: chronically ill appearing Lungs- coarse lung sounds CARDIAC:  JVP: 7 cm          Normal rate with regular rhythm. no edema.  ABDOMEN: Soft, non-tender, non-distended.  EXTREMITIES: Warm and well perfused.  NEUROLOGIC: No obvious FND    Telemetry   NSR 90s, personally reviewed   EKG    N/A   Labs    CBC Recent Labs    07/14/23 0249 07/14/23 1142 07/15/23 0410  WBC 2.2*  --  1.9*  HGB 11.3* 12.9  12.9 12.1  HCT 37.4 38.0  38.0 39.1  MCV 88.8  --  87.3  PLT 212  --  215   Basic Metabolic Panel Recent Labs    16/10/96 0829 07/14/23 0249 07/14/23 1142 07/15/23 0410  NA 132* 133* 138  138 132*  K 3.7 4.2 4.3  4.2 3.9  CL 95* 98  --  96*  CO2 28 30  --  31  GLUCOSE 100* 80  --  87  BUN 14 13  --  13  CREATININE 0.65 0.65  --  0.66  CALCIUM 8.1* 8.1*  --   8.1*  MG 2.1  --   --   --    BNP: BNP (last 3 results) Recent Labs    07/09/23 1320  BNP 566.0*     Other results:   Imaging    NM Pulmonary Perfusion Result Date: 07/14/2023 CLINICAL DATA:  Pulmonary hypertension EXAM: NUCLEAR MEDICINE PERFUSION LUNG SCAN TECHNIQUE: Perfusion images were obtained in multiple projections after intravenous injection of radiopharmaceutical. Ventilation scans intentionally deferred if perfusion scan and chest x-ray adequate for interpretation during COVID 19 epidemic. RADIOPHARMACEUTICALS:  4.0 mCi Tc-18m MAA IV COMPARISON:  None available. Findings are correlated with chest radiograph of 4:16 p.m. FINDINGS: No unmatched perfusion defects are identified. Apparent elevation of the left hemidiaphragm likely relates to patient positioning and is artifactual in nature. IMPRESSION: No evidence of pulmonary embolism Electronically Signed   By: Worthy Heads M.D.   On: 07/14/2023 21:59   DG CHEST PORT 1 VIEW Result Date: 07/14/2023 CLINICAL DATA:  Pulmonary hypertension EXAM: PORTABLE CHEST 1 VIEW COMPARISON:  07/12/2023 FINDINGS: Heart mildly  enlarged. No confluent airspace opacities, effusions or edema. No acute bony abnormality. IMPRESSION: Cardiomegaly.  No active disease. Electronically Signed   By: Janeece Mechanic M.D.   On: 07/14/2023 20:03     Medications:     Scheduled Medications:  aspirin EC  325 mg Oral Daily   atorvastatin  80 mg Oral Daily   Chlorhexidine Gluconate Cloth  6 each Topical Daily   enoxaparin (LOVENOX) injection  40 mg Subcutaneous Q24H   feeding supplement  237 mL Oral BID BM   leptospermum manuka honey  1 Application Topical Daily   levothyroxine  50 mcg Oral QAC breakfast   nicotine  7 mg Transdermal Daily   polyethylene glycol  17 g Oral Daily   sildenafil  20 mg Oral TID    Infusions:    PRN Medications: acetaminophen **OR** acetaminophen, melatonin    Patient Profile   [name] w/ HTN, HLD, RA,  hypothyroidism, hx of 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 On arrival, CT chest negative for PE. TTE w/ preserved LV function & dilated RV with severely reduced function; RVSP . Patient transferred here for RHC and evaluation for PH.   Assessment/Plan   1. Pulmonary hypertensin / RV failure - Echo with preserved LVEF and severely reduced RV. Uncleared etiology. Suspect underlying PH. CT of chest w/ enlarged central PA suggesting PAH. CTA negative for PE. BNP 566.  - RHC on 07/15/23 with RA 4, PCWP 13, mPA 44 and PVR of 6.4 with CI of 2.4 L/min/m2.  - Patient spends a lot of her time in bed or in a wheelchair due to hx of CVA. She also has a long hx of tobacco use. CT chest does not show severe emphysema. In addition she has seropositive RA. Will obtain labs from her rheumatologist once outpatient.  - V/Q scans negative. Will need PFTs to further evaluate for COPD (CT chest benign).  - Started sildenafil 20mg  TID. Treatment options will be limited by her poor baseline functional status. Discussed with family.  - Euvolemic on exam today; tolerating sildenafil well. sCr stable at 0.66.  - Start lasix 20mg  daily tomorrow.  - Stable for discharge from AHF standpoint.    2. HS Trop mildly elevated - 108>>115>>124>>147>>146 - Suspect demand ischemia - Denies CP    3. Hypothyroidism  - on Levothyroxine    4. Seropositive RA - on Humira and methotrexate - followed by Dr. Rodell Citrin     5. Fever - fever resolved - Leukopenia- in the setting of Humira  - Blood Cultures NGTD       Length of Stay: 6  Deavion Strider, DO  07/15/2023, 11:12 AM  Advanced Heart Failure Team Pager [phone] (M-F; 7a - 5p)  Please contact [facility] for night-coverage after hours (5p -7a ) and weekends on [email or web address]

## 2023-07-16 ENCOUNTER — Other Ambulatory Visit: Payer: Self-pay | Admitting: Internal Medicine

## 2023-07-16 DIAGNOSIS — I50811 Acute right heart failure: Secondary | ICD-10-CM | POA: Diagnosis not present

## 2023-07-16 DIAGNOSIS — M059 Rheumatoid arthritis with rheumatoid factor, unspecified: Secondary | ICD-10-CM

## 2023-07-16 LAB — BASIC METABOLIC PANEL WITH GFR
Anion gap: 6 (ref 5–15)
BUN: 12 mg/dL (ref 6–20)
CO2: 30 mmol/L (ref 22–32)
Calcium: 8.3 mg/dL — ABNORMAL LOW (ref 8.9–10.3)
Chloride: 96 mmol/L — ABNORMAL LOW (ref 98–111)
Creatinine, Ser: 0.65 mg/dL (ref 0.44–1.00)
GFR, Estimated: >60 mL/min (ref >60)
Glucose, Bld: 95 mg/dL (ref 70–99)
Potassium: 4 mmol/L (ref 3.5–5.1)
Sodium: 132 mmol/L — ABNORMAL LOW (ref 135–145)

## 2023-07-16 LAB — CBC
HCT: 36.4 % (ref 36.0–46.0)
Hemoglobin: 11.1 g/dL — ABNORMAL LOW (ref 12.0–15.0)
MCH: 26.7 pg (ref 26.0–34.0)
MCHC: 30.5 g/dL (ref 30.0–36.0)
MCV: 87.7 fL (ref 80.0–100.0)
Platelets: 255 10*3/uL (ref 150–400)
RBC: 4.15 MIL/uL (ref 3.87–5.11)
RDW: 16.7 % — ABNORMAL HIGH (ref 11.5–15.5)
WBC: 2.5 10*3/uL — ABNORMAL LOW (ref 4.0–10.5)
nRBC: 1.2 % — ABNORMAL HIGH (ref 0.0–0.2)

## 2023-07-16 MED ORDER — FUROSEMIDE 20 MG PO TABS
20.0000 mg | ORAL_TABLET | Freq: Every day | ORAL | Status: DC
  Administered 2023-07-16: 20 mg via ORAL
  Filled 2023-07-16: qty 1

## 2023-07-16 MED ORDER — PROCHLORPERAZINE EDISYLATE 10 MG/2ML IJ SOLN
5.0000 mg | Freq: Once | INTRAMUSCULAR | Status: AC
  Administered 2023-07-16: 5 mg via INTRAVENOUS
  Filled 2023-07-16: qty 2

## 2023-07-16 MED ORDER — FUROSEMIDE 20 MG PO TABS: 20.0000 mg | ORAL_TABLET | Freq: Every day | ORAL | 2 refills | Status: DC

## 2023-07-16 MED ORDER — SILDENAFIL CITRATE 20 MG PO TABS: 20.0000 mg | ORAL_TABLET | Freq: Three times a day (TID) | ORAL | 3 refills | Status: DC

## 2023-07-16 NOTE — Plan of Care (Signed)
  Problem: Education: Goal: Knowledge of General Education information will improve Description: Including pain rating scale, medication(s)/side effects and non-pharmacologic comfort measures Outcome: Adequate for Discharge   

## 2023-07-16 NOTE — Plan of Care (Signed)

## 2023-07-16 NOTE — Progress Notes (Signed)
 Pt and husband insisted pt did not need PTAR to transport that she could get in car, 4 times while attempting to sit pt up on side of be she threw herself backwards and wrenched this RN oveer the bed , after the 4th time of pt doing that this RN informed pt and family PTAR was going to be ordered as this was unsafe for both pt and anyone attempting to assist transfers with her. Pt told this RN she was "going home and not staying here anymore and to shut up and do your job"  Asked CNA to assist as well.  Finally got pt into wheelchair after about 15 minutes .  When pt as assisted into car she had her husband roll down the window she put her arm through to semi stand and he was trying to [facility] her into car while bracing her with assistance from this RN and CNA..  Mr. Harrison insited he had help to get her out of car into house but givne the pt's resistance pt should be transported by [facility] from now on.

## 2023-07-16 NOTE — Discharge Summary (Signed)
 Physician Discharge Summary   Patient: [name] MRN: [id number] DOB: [birth year]  Admit date:     07/09/2023  Discharge date: 07/16/23  Discharge Physician: MDALA-GAUSI, Jilda Most   PCP: [facility]   Recommendations at discharge:    Follow up with Cardiology  Discharge Diagnoses: Principal Problem:   Acute CHF (congestive heart failure) (HCC) Active Problems:   Essential hypertension   CVA (cerebral vascular accident) (HCC)   Mixed hyperlipidemia   Acute respiratory failure with hypoxia (HCC)   Elevated brain natriuretic peptide (BNP) level   Elevated d-dimer   Elevated troponin   Leukopenia   Prolonged QT interval   Hypoalbuminemia due to protein-calorie malnutrition (HCC)   Elevated AST (SGOT)   Obesity, Class II, BMI 35-39.9   Shortness of breath   Pressure injury of skin   Pulmonary hypertension, unspecified (HCC)  Resolved Problems:   * No resolved hospital problems. *  Hospital Course: 58 y.o. woman with PMH of hypertension, hyperlipidemia, acquired hypothyroidism, reportedly no longer on levothyroxine, CVA with residual left-sided weakness, tobacco use disorder who presented to the emergency department from home via EMS due to 2-week onset of increased shortness of breath with difficulty in being able to sleep at night, so she has been taking Benadryl with subsequent stomach upset.  Family thought that her shortness of breath was the cause of difficulty to sleep at night.  Patient also endorsed increased weakness and have sustained 2 falls during transfer due to weakness.   Patient is bed bound -wheelchair bound at baseline.   Patient was admitted with acute CHF with noted elevated troponin levels and suspected PE with elevated D-dimer.  CT angiogram did not demonstrate PE.  She continued to have symptoms of heart failure requiring IV diuresis and cardiology asked to consult to assist with management.  2D echocardiogram with LVEF  50-55% and signs of RV failure.  Cardiology team assessed and felt she required transfer to [facility] for consultation with the advanced heart failure service and likely RHC.   The hospital course is in problem-based format below:    Acute HFpEF  RV Heart Failure  Transferred [facility] for advanced heart failure consultation. Patient has been seen by the advanced heart failure team. She underwent RHC on 4/11 and it revealed severely elevated PVR.  Patient was started on sildenafil. VQ scan negative.  PFTs pending.  Patient discharging home on sildenafil and daily Lasix. Prior amlodipine and hydralazine have been discontinued. Patient to follow-up with cardiology.  Acute respiratory failure with hypoxia  Patient required supplemental oxygen, 2 L/min. She was able to be weaned off oxygen prior to discharge.   Leukopenia  Secondary to methotrexate therapy. Home methotrexate was resumed at discharge.   Hypertension Patient is on Norvasc, hydralazine at home. Given relatively low pressures and new medications (sildenafil, Lasix), Norvasc and hydralazine have been discontinued.    History of CVA Resumed home atorvastatin, aspirin.   Elevated D dimer CTA chest negative for PE  VQ also negative.    Concern for 1.3 cm left renal mass This was initially noted on CTA of the chest. Follow-up CT abdomen revealed that this is not an actual mass but corresponds to region of cortical lobulation of the normal renal cortex.  No further evaluation indicated.       Consultants: Cardiology Procedures performed: n/a  Disposition: Home Diet recommendation:  Discharge Diet Orders (From admission, onward)     Start  Ordered   07/16/23 0000  Diet - low sodium heart healthy        07/16/23 1059           Cardiac diet DISCHARGE MEDICATION: Allergies as of 07/16/2023       Reactions   Lisinopril Nausea Only        Medication List     STOP taking these medications     amLODipine 10 MG tablet Commonly known as: NORVASC   hydrALAZINE 50 MG tablet Commonly known as: APRESOLINE       TAKE these medications    acetaminophen 500 MG tablet Commonly known as: TYLENOL Take 1,000 mg by mouth every 6 (six) hours as needed for mild pain.   aspirin EC 325 MG tablet Take 1 tablet (325 mg total) by mouth daily.   atorvastatin 80 MG tablet Commonly known as: LIPITOR Take 80 mg by mouth daily.   furosemide 20 MG tablet Commonly known as: LASIX Take 1 tablet (20 mg total) by mouth daily. Start taking on: July 17, 2023   Humira (2 Pen) 40 MG/0.4ML pen Generic drug: adalimumab INJECT 40 MG INTO THE SKIN EVERY 14 DAYS   levothyroxine 50 MCG tablet Commonly known as: SYNTHROID Take 50 mcg by mouth daily before breakfast.   methotrexate 2.5 MG tablet Commonly known as: RHEUMATREX Take 6 tablets (15 mg total) by mouth once a week. Caution:Chemotherapy. Protect from light.   sildenafil 20 MG tablet Commonly known as: REVATIO Take 1 tablet (20 mg total) by mouth 3 (three) times daily.   traZODone 100 MG tablet Commonly known as: DESYREL Take 100 mg by mouth at bedtime.   Vitamin D (Ergocalciferol) 1.25 MG (50000 UNIT) Caps capsule Commonly known as: DRISDOL Take 50,000 Units by mouth once a week.               Durable Medical Equipment  (From admission, onward)           Start     Ordered   07/10/23 1218  For home use only DME Other see comment  Once       Comments: Free standing trapeze bar.  Question:  Length of Need  Answer:  Lifetime   07/10/23 1217   07/10/23 1217  For home use only DME 3 n 1  Once        07/10/23 1216              Discharge Care Instructions  (From admission, onward)           Start     Ordered   07/16/23 0000  Discharge wound care:       Comments: Wound care  Daily      Comments: Cleanse coccyx/buttocks wounds with Vashe wound cleanser Timm Foot [id number]), do not rinse and allow to air dry.   Apply Medihoney to wound beds daily, cover with dry gauze and silicone foam.   07/16/23 1059            Discharge Exam: Filed Weights   07/14/23 0424 07/15/23 0415 07/16/23 0536  Weight: 103.4 kg 101 kg 104.3 kg     General: Alert, oriented X3  Eyes: Pupils equal, reactive  Oral cavity: moist mucous membranes  Head: Atraumatic, normocephalic  Neck: supple  Chest: clear to auscultation. No crackles, no wheezes  CVS: S1,S2 RRR. No murmurs  Abd: No distention, soft, non-tender. No masses palpable  Extr: No edema   MSK: No joint deformities or swelling  Neurological: Left  sided weakness  Condition at discharge: stable  The results of significant diagnostics from this hospitalization (including imaging, microbiology, ancillary and laboratory) are listed below for reference.   Imaging Studies: NM Pulmonary Perfusion Result Date: 07/14/2023 CLINICAL DATA:  Pulmonary hypertension EXAM: NUCLEAR MEDICINE PERFUSION LUNG SCAN TECHNIQUE: Perfusion images were obtained in multiple projections after intravenous injection of radiopharmaceutical. Ventilation scans intentionally deferred if perfusion scan and chest x-ray adequate for interpretation during COVID 19 epidemic. RADIOPHARMACEUTICALS:  4.0 mCi Tc-76m MAA IV COMPARISON:  None available. Findings are correlated with chest radiograph of 4:16 p.m. FINDINGS: No unmatched perfusion defects are identified. Apparent elevation of the left hemidiaphragm likely relates to patient positioning and is artifactual in nature. IMPRESSION: No evidence of pulmonary embolism Electronically Signed   By: Worthy Heads M.D.   On: 07/14/2023 21:59   DG CHEST PORT 1 VIEW Result Date: 07/14/2023 CLINICAL DATA:  Pulmonary hypertension EXAM: PORTABLE CHEST 1 VIEW COMPARISON:  07/12/2023 FINDINGS: Heart mildly enlarged. No confluent airspace opacities, effusions or edema. No acute bony abnormality. IMPRESSION: Cardiomegaly.  No active disease. Electronically  Signed   By: Janeece Mechanic M.D.   On: 07/14/2023 20:03   CARDIAC CATHETERIZATION Result Date: 07/14/2023 HEMODYNAMICS: RA:   4 mmHg (mean) RV:   65/5 mmHg PA:   69/29 mmHg (44 mean) PCWP:  13 mmHg (mean)    Estimated Fick CO/CI   5.1 L/min, 2. 4 L/min/m2 Thermodilution CO/CI   4.8 L/min, 2.3 L/min/m2    TPG    31  mmHg     PVR     6.45-6 Wood Units PAPi      10  IMPRESSION: Normal filling pressures Severely elevated PVR & PA mean Mildly reduced cardiac output & index. Aditya Sabharwal 11:56 AM   CT ABDOMEN PELVIS W CONTRAST Result Date: 07/12/2023 CLINICAL DATA:  Renal mass/cyst, indeterminate EXAM: CT ABDOMEN AND PELVIS WITH CONTRAST TECHNIQUE: Multidetector CT imaging of the abdomen and pelvis was performed using the standard protocol following bolus administration of intravenous contrast. RADIATION DOSE REDUCTION: This exam was performed according to the departmental dose-optimization program which includes automated exposure control, adjustment of the mA and/or kV according to patient size and/or use of iterative reconstruction technique. CONTRAST:  OMNIPAQUE IOHEXOL 300 MG/ML  SOLN COMPARISON:  July 10, 2023, January 14, 2022 FINDINGS: Lower chest: No focal airspace consolidation or pleural effusion. Hepatobiliary: No mass.No radiopaque stones or wall thickening of the gallbladder.No intrahepatic or extrahepatic biliary ductal dilation.The portal veins are patent. Pancreas: No mass or main ductal dilation.No peripancreatic inflammation or fluid collection. Spleen: Normal size. No mass. Adrenals/Urinary Tract: No adrenal masses. No renal mass. The region of concern in the upper pole of the left kidney on the prior CT is a region of cortical lobulation of the normal renal cortex. No nephrolithiasis or hydronephrosis. The urinary bladder is distended without focal abnormality. Stomach/Bowel: The stomach is decompressed without focal abnormality. No small bowel wall thickening or inflammation. No small  bowel obstruction. Normal appendix. Vascular/Lymphatic: No aortic aneurysm. Diffuse aortoiliac atherosclerosis. No intraabdominal or pelvic lymphadenopathy. Reproductive: Age-related atrophy of the uterus and ovaries. No concerning adnexal mass.No free pelvic fluid. Other: No pneumoperitoneum, ascites, or mesenteric inflammation. Musculoskeletal: No acute fracture or destructive lesion.Multilevel degenerative disc disease of the spine. Bilateral hip osteoarthritis, worst on the right, where there is bone on bone articulation. IMPRESSION: 1. No acute intra-abdominal or pelvic abnormality. 2. The region of concern in the upper pole of the left kidney on the prior CT  corresponds to a region of cortical lobulation of the normal renal cortex. No concerning renal mass. Electronically Signed   By: Rance Burrows M.D.   On: 07/12/2023 16:46   DG CHEST PORT 1 VIEW Result Date: 07/12/2023 CLINICAL DATA:  Fever, dyspnea EXAM: PORTABLE CHEST 1 VIEW COMPARISON:  July 09, 2023, July 10, 2023 FINDINGS: Low lung volumes. No focal airspace consolidation, pleural effusion, or pneumothorax. Mild cardiomegaly. No acute fracture or destructive lesion. Multilevel thoracic osteophytosis. IMPRESSION: Cardiomegaly.  No pneumonia or pulmonary edema. Electronically Signed   By: Rance Burrows M.D.   On: 07/12/2023 16:13   ECHOCARDIOGRAM COMPLETE Result Date: 07/11/2023    ECHOCARDIOGRAM REPORT   Patient Name:   AMARISSA Garson Date of Exam: 07/11/2023 Medical Rec #:  [id number]    Height:       66.0 in Accession #:    [id number]   Weight:       229.5 lb Date of Birth:  1965/12/09    BSA:          2.120 m Patient Age:    58 years     BP:           91/53 mmHg Patient Gender: F            HR:           107 bpm. Exam Location:  [facility] Procedure: 2D Echo, Cardiac Doppler, Color Doppler and Intracardiac            Opacification Agent (Both Spectral and Color Flow Doppler were            utilized during procedure). Indications:    CHF-acute  diastolic  History:        Patient has prior history of Echocardiogram examinations, most                 recent 06/26/2013. CHF, Stroke; Risk Factors:Hypertension and                 Dyslipidemia.  Sonographer:    Juanita Shaw Referring Phys: [id number] OLADAPO ADEFESO IMPRESSIONS  1. Septal flattening consistent with RV volume/ pressure overload. Left ventricular ejection fraction, by estimation, is 50 to 55%. The left ventricle has low normal function.  2. Right ventricular systolic function moderate to severely reduced. The right ventricular size is moderately enlarged. There is moderately elevated pulmonary artery systolic pressure.  3. Right atrial size was mild to moderately dilated.  4. The mitral valve is normal in structure. Trivial mitral valve regurgitation.  5. The aortic valve is tricuspid. Aortic valve regurgitation is not visualized. FINDINGS  Left Ventricle: Septal flattening consistent with RV volume/ pressure overload. Left ventricular ejection fraction, by estimation, is 50 to 55%. The left ventricle has low normal function. The left ventricular internal cavity size was normal in size. There is no left ventricular hypertrophy. Right Ventricle: The right ventricular size is moderately enlarged. Right vetricular wall thickness was not assessed. Right ventricular systolic function moderate to severely reduced. There is moderately elevated pulmonary artery systolic pressure. The tricuspid regurgitant velocity is 3.12 m/s, and with an assumed right atrial pressure of 8 mmHg, the estimated right ventricular systolic pressure is 46.9 mmHg. Left Atrium: Left atrial size was normal in size. Right Atrium: Right atrial size was mild to moderately dilated. Pericardium: There is no evidence of pericardial effusion. Mitral Valve: The mitral valve is normal in structure. Trivial mitral valve regurgitation. MV peak gradient, 2.3 mmHg. The mean mitral valve gradient is 1.0  mmHg. Tricuspid Valve: The tricuspid valve  is normal in structure. Tricuspid valve regurgitation is mild. Aortic Valve: The aortic valve is tricuspid. Aortic valve regurgitation is not visualized. Aortic valve mean gradient measures 4.0 mmHg. Aortic valve peak gradient measures 7.6 mmHg. Aortic valve area, by VTI measures 2.25 cm. Pulmonic Valve: The pulmonic valve was normal in structure. Pulmonic valve regurgitation is not visualized. Aorta: The aortic root and ascending aorta are structurally normal, with no evidence of dilitation. IAS/Shunts: The interatrial septum was not well visualized.  LEFT VENTRICLE PLAX 2D LVIDd:         4.40 cm      Diastology LVIDs:         2.60 cm      LV e' medial:    7.51 cm/s LV PW:         0.80 cm      LV E/e' medial:  7.6 LV IVS:        0.90 cm      LV e' lateral:   10.20 cm/s LVOT diam:     2.10 cm      LV E/e' lateral: 5.6 LV SV:         36 LV SV Index:   17 LVOT Area:     3.46 cm  LV Volumes (MOD) LV vol d, MOD A2C: 108.0 ml LV vol d, MOD A4C: 106.0 ml LV vol s, MOD A2C: 48.8 ml LV vol s, MOD A4C: 43.1 ml LV SV MOD A2C:     59.2 ml LV SV MOD A4C:     106.0 ml LV SV MOD BP:      61.6 ml RIGHT VENTRICLE            IVC RV Basal diam:  4.80 cm    IVC diam: 1.80 cm RV Mid diam:    2.90 cm RV S prime:     9.79 cm/s TAPSE (M-mode): 1.9 cm LEFT ATRIUM           Index        RIGHT ATRIUM           Index LA diam:      3.00 cm 1.41 cm/m   RA Area:     24.20 cm LA Vol (A2C): 28.6 ml 13.49 ml/m  RA Volume:   80.20 ml  37.83 ml/m LA Vol (A4C): 15.9 ml 7.50 ml/m  AORTIC VALVE                    PULMONIC VALVE AV Area (Vmax):    2.51 cm     PV Vmax:       1.00 m/s AV Area (Vmean):   1.97 cm     PV Peak grad:  4.0 mmHg AV Area (VTI):     2.25 cm AV Vmax:           138.00 cm/s AV Vmean:          97.800 cm/s AV VTI:            0.160 m AV Peak Grad:      7.6 mmHg AV Mean Grad:      4.0 mmHg LVOT Vmax:         100.00 cm/s LVOT Vmean:        55.500 cm/s LVOT VTI:          0.104 m LVOT/AV VTI ratio: 0.65  AORTA Ao Root diam: 2.90 cm  Ao Asc diam:  3.00 cm MITRAL  VALVE               TRICUSPID VALVE MV Area (PHT): 5.38 cm    TR Peak grad:   38.9 mmHg MV Area VTI:   2.93 cm    TR Vmax:        312.00 cm/s MV Peak grad:  2.3 mmHg MV Mean grad:  1.0 mmHg    SHUNTS MV Vmax:       0.76 m/s    Systemic VTI:  0.10 m MV Vmean:      55.8 cm/s   Systemic Diam: 2.10 cm MV Decel Time: 141 msec MV E velocity: 57.00 cm/s MV A velocity: 74.60 cm/s MV E/A ratio:  0.76 Ola Berger MD Electronically signed by Ola Berger MD Signature Date/Time: 07/11/2023/4:18:32 PM    Final    CT Angio Chest PE W/Cm &/Or Wo Cm Result Date: 07/10/2023 CLINICAL DATA:  Increasing shortness of breath for the past 2 weeks. Elevated D-dimer. EXAM: CT ANGIOGRAPHY CHEST WITH CONTRAST TECHNIQUE: Multidetector CT imaging of the chest was performed using the standard protocol during bolus administration of intravenous contrast. Multiplanar CT image reconstructions and MIPs were obtained to evaluate the vascular anatomy. RADIATION DOSE REDUCTION: This exam was performed according to the departmental dose-optimization program which includes automated exposure control, adjustment of the mA and/or kV according to patient size and/or use of iterative reconstruction technique. CONTRAST:  75mL OMNIPAQUE IOHEXOL 350 MG/ML SOLN COMPARISON:  01/14/2022 FINDINGS: Cardiovascular: Normally opacified pulmonary arteries with no pulmonary arterial filling defects seen. Enlarged heart. No pericardial effusion. Minimal atheromatous aortic and coronary artery calcification. Mildly enlarged central pulmonary arteries with a main pulmonary artery diameter of 3.5 cm. Mediastinum/Nodes: No enlarged mediastinal, hilar, or axillary lymph nodes. Thyroid gland, trachea, and esophagus demonstrate no significant findings. Lungs/Pleura: Minimal left pleural effusion with improvement. Mild left lower lobe and minimal right lower lobe atelectasis with improvement. Resolved right pleural fluid. Upper Abdomen: Partially  included 1.3 cm upper pole left renal low-density mass or cyst. Musculoskeletal: Thoracic and lower cervical spine degenerative changes. Review of the MIP images confirms the above findings. IMPRESSION: 1. No pulmonary emboli. 2. Minimal left pleural effusion with improvement. 3. Cardiomegaly. 4. Mildly enlarged central pulmonary arteries, suggesting pulmonary arterial hypertension. 5. Partially included 1.3 cm upper pole left renal low-density mass or cyst. Recommend abdomen and pelvis CT with contrast for further evaluation. Ultrasound would most likely not be beneficial due to body habitus. 6. Minimal calcific coronary artery and aortic atherosclerosis. Electronically Signed   By: Catherin Closs M.D.   On: 07/10/2023 16:44   DG Chest Portable 1 View Result Date: 07/09/2023 CLINICAL DATA:  Shortness of breath. EXAM: PORTABLE CHEST 1 VIEW COMPARISON:  01/17/2022. FINDINGS: Heart is enlarged and the mediastinal contour is stable. Pulmonary vasculature is mildly distended. Lung volumes are low with mild atelectasis at the left lung base. No consolidation, effusion, or pneumothorax. No acute osseous abnormality. IMPRESSION: 1. Cardiomegaly with distended pulmonary vasculature. 2. Low lung volumes with left basilar atelectasis. Electronically Signed   By: Wyvonnia Heimlich M.D.   On: 07/09/2023 12:56    Microbiology: Results for orders placed or performed during the hospital encounter of 07/09/23  Blood culture (routine x 2)     Status: None   Collection Time: 07/09/23 12:27 PM   Specimen: BLOOD RIGHT ARM  Result Value Ref Range Status   Specimen Description BLOOD RIGHT ARM AEROBIC BOTTLE ONLY  Final   Special Requests Blood Culture adequate volume  Final  Culture   Final    NO GROWTH 5 DAYS Performed at [facility], [address] [place], [place] [address]    Report Status 07/14/2023 FINAL  Final  Blood culture (routine x 2)     Status: None   Collection Time: 07/09/23 12:27 PM   Specimen: BLOOD   Result Value Ref Range Status   Specimen Description BLOOD BLOOD LEFT HAND  Final   Special Requests   Final    AEROBIC BOTTLE ONLY Blood Culture results may not be optimal due to an inadequate volume of blood received in culture bottles   Culture   Final    NO GROWTH 5 DAYS Performed at [facility], [address] [place], [place] [address]    Report Status 07/14/2023 FINAL  Final  MRSA Next Gen by PCR, Nasal     Status: None   Collection Time: 07/09/23  8:34 PM   Specimen: Nasal Mucosa; Nasal Swab  Result Value Ref Range Status   MRSA by PCR Next Gen NOT DETECTED NOT DETECTED Final    Comment: (NOTE) The GeneXpert MRSA Assay (FDA approved for NASAL specimens only), is one component of a comprehensive MRSA colonization surveillance program. It is not intended to diagnose MRSA infection nor to guide or monitor treatment for MRSA infections. Test performance is not FDA approved in patients less than 67 years old. Performed at [facility], [address] [place], [place] [address]   Culture, blood (Routine X 2) w Reflex to ID Panel     Status: None (Preliminary result)   Collection Time: 07/12/23 12:35 PM   Specimen: BLOOD  Result Value Ref Range Status   Specimen Description BLOOD BLOOD RIGHT ARM  Final   Special Requests   Final    BOTTLES DRAWN AEROBIC AND ANAEROBIC Blood Culture adequate volume   Culture   Final    NO GROWTH 4 DAYS Performed at [facility], [address], [place], [place] [address]    Report Status PENDING  Incomplete  Culture, blood (Routine X 2) w Reflex to ID Panel     Status: None (Preliminary result)   Collection Time: 07/12/23 12:35 PM   Specimen: BLOOD  Result Value Ref Range Status   Specimen Description BLOOD BLOOD RIGHT HAND  Final   Special Requests   Final    Blood Culture adequate volume BOTTLES DRAWN AEROBIC AND ANAEROBIC   Culture   Final    NO GROWTH 4 DAYS Performed at [facility] [address] [place], [place] [address]     Report Status PENDING  Incomplete    Labs: CBC: Recent Labs  Lab 07/09/23 1320 07/10/23 0622 07/12/23 0429 07/13/23 0829 07/14/23 0249 07/14/23 1142 07/15/23 0410 07/16/23 0232  WBC 1.9*   < > 2.8* 1.5* 2.2*  --  1.9* 2.5*  NEUTROABS 0.8*  --   --   --   --   --   --   --   HGB 11.2*   < > 9.8* 11.2* 11.3* 12.9  12.9 12.1 11.1*  HCT 35.9*   < > 32.0* 36.3 37.4 38.0  38.0 39.1 36.4  MCV 89.1   < > 90.1 89.0 88.8  --  87.3 87.7  PLT 252   < > 233 246 212  --  215 255   < > = values in this interval not displayed.   Basic Metabolic Panel: Recent Labs  Lab 07/10/23 0622 07/11/23 0437 07/12/23 0429 07/13/23 0829 07/14/23 0249 07/14/23 1142 07/15/23  0410 07/16/23 0232  NA 136 135 134* 132* 133* 138  138 132* 132*  K 3.9 3.9 4.0 3.7 4.2 4.3  4.2 3.9 4.0  CL 101 99 98 95* 98  --  96* 96*  CO2 26 30 30 28 30   --  31 30  GLUCOSE 79 82 81 100* 80  --  87 95  BUN 14 17 17 14 13   --  13 12  CREATININE 0.81 0.83 0.76 0.65 0.65  --  0.66 0.65  CALCIUM 8.4* 8.2* 8.1* 8.1* 8.1*  --  8.1* 8.3*  MG 1.6* 1.4* 1.6* 2.1  --   --   --   --   PHOS 4.0  --   --   --   --   --   --   --    Liver Function Tests: Recent Labs  Lab 07/09/23 1505 07/10/23 0622 07/12/23 0429  AST 153* 132* 129*  ALT 39 33 30  ALKPHOS 60 52 44  BILITOT 0.6 0.5 0.7  PROT 9.5* 8.7* 8.3*  ALBUMIN 2.3* 2.1* 2.1*   CBG: No results for input(s): "GLUCAP" in the last 168 hours.  Discharge time spent: greater than 30 minutes.  Signed: MDALA-GAUSI, Sumaiyah Markert AGATHA, MD [facility] 07/16/2023

## 2023-07-17 LAB — CULTURE, BLOOD (ROUTINE X 2)
Culture: NO GROWTH
Culture: NO GROWTH
Special Requests: ADEQUATE
Special Requests: ADEQUATE

## 2023-07-17 NOTE — TOC Transition Note (Signed)
 Transition of Care [facility]) - Discharge Note   Patient Details  Name: [name] MRN: [id number] Date of Birth: 1965-09-11  Transition of Care [facility]) CM/SW Contact:  Benjiman Bras, RN Phone Number: [phone] 07/17/2023, 9:58 AM   Clinical Narrative:     Message sent to pt's PCP office to schedule hospital follow up appointment.   Final next level of care: Home/Self Care Barriers to Discharge: No Barriers Identified   Patient Goals and CMS Choice Patient states their goals for this hospitalization and ongoing recovery are:: return home CMS Medicare.gov Compare Post Acute Care list provided to:: Patient Choice offered to / list presented to : Patient      Discharge Placement                       Discharge Plan and Services Additional resources added to the After Visit Summary for   In-house Referral: Clinical Social Work   Post Acute Care Choice: Durable Medical Equipment          DME Arranged: 3-N-1, Elridge Haller DME Agency: [employer] Date DME Agency Contacted: 07/10/23   Representative spoke with at DME Agency: Gladys Lamp            Social Drivers of Health (SDOH) Interventions SDOH Screenings   Food Insecurity: No Food Insecurity (07/09/2023)  Housing: Low Risk  (07/09/2023)  Transportation Needs: No Transportation Needs (07/09/2023)  Utilities: Not At Risk (07/09/2023)  Alcohol Screen: Low Risk  (08/31/2021)  Depression (PHQ2-9): Low Risk  (08/31/2021)  Financial Resource Strain: Low Risk  (04/07/2023)   Received from [facility]  Physical Activity: Sufficiently Active (08/31/2021)  Recent Concern: Physical Activity - Inactive (07/07/2021)   Received from [facility], [facility]  Social Connections: Unknown (07/08/2022)   Received from [facility], [facility]  Stress: No Stress Concern Present (08/31/2021)  Tobacco Use: High Risk (07/12/2023)     Readmission Risk Interventions     No data to display

## 2023-07-17 NOTE — Telephone Encounter (Signed)
 Last Fill: 06/14/2023 2 pens  Labs: 07/16/2023 WBC 2.5, hemoglobin 11.1, RDW 16.7, nRBC 1.2, Sodium 132, Chloride 96, Calcium 8.3   TB Gold: 11/22/2022   NEGATIVE   Next Visit: 09/06/2023  Last Visit: 02/22/2023   [id number] rheumatoid arthritis   Current Dose per office note 02/22/2023: Humira 40 mg subcu q. 14 days   Okay to refill Humira?

## 2023-07-22 ENCOUNTER — Telehealth: Payer: Self-pay | Admitting: Cardiology

## 2023-07-22 NOTE — Telephone Encounter (Signed)
 Outpatient service line:  Patient has recent admission with severe right-sided heart failure, seen by the advanced heart failure group.  Since discharge the daughter reports that she is not producing hardly any urine, as small as a small cup of apple juice that they give here in the hospital.  Does not note her to be significantly volume up but she says she is very short of breath and has blood pressure with systolics in the 90s.  Her next follow-up appointment is not till May 5, I have recommended her to seek the nearest emergency department.  She will either go to [facility] or [place].  Will send message to advanced heart failure to make them aware.

## 2023-08-04 ENCOUNTER — Telehealth (HOSPITAL_COMMUNITY): Payer: Self-pay

## 2023-08-04 NOTE — Telephone Encounter (Signed)
 Called and spoke to pt's daughter [name] to confirm/remind patient of their appointment at the [facility] on 08/07/23.   Appointment:   [x] Confirmed  [] Left mess   [] No answer/No voice mail  [] VM Full/unable to leave message  [] Phone not in service  Patient reminded to bring all medications and/or complete list.  Confirmed patient has transportation. Gave directions, instructed to utilize valet parking.

## 2023-08-04 NOTE — Progress Notes (Signed)
 ADVANCED HF CLINIC CONSULT NOTE   Primary Care: [facility] Primary Cardiologist: Ola Berger, MD HF Cardiologist: Dr. Bruce Caper  HPI: Ms Nishi is a 58 y.o. with a history of CVA left hemiparesis, HTN, HLD, anemia, seropositive rheumatoid arthritis, and hypothyroidism. Functionally limited and has been bed bound or in a wheel chair.    Followed by Rheumatology for seropositive RA. She has been on Humira  and methotrexate . Looks like this was started 2023.    Admitted 4/24 to [facility] with acute RV failure. Echo showed EF 50-55%, RV severely reduced, RVSP 47. Initially placed on IV lasix  but later stopped 2/2 RV failure and hypotension. Transferred to [facility] and underwent RHC showing normal filling pressures and severely elevated PVR. She was started in sildenafil  20 mg tid and discharged home, weight 222 lbs.  Today she returns for post hospital HF follow up with her family. Overall feeling poorly. BP has been low. Appetite poor, having fecal incontinence. Not urinating much. She is SOB with minimal activity. Daughter says she is worse since her discharge. She requires assistance with ADLs. Denies palpitations, abnormal bleeding, CP, or PND/Orthopnea. Appetite poor. Taking all medications. Longtime smoker, 1 ppd. History of snoring.  Past Medical History:  Diagnosis Date   High cholesterol    Hypertension    Hypothyroidism    Stroke (HCC)    weakness on left side.   Vaginal Pap smear, abnormal    Current Outpatient Medications  Medication Sig Dispense Refill   acetaminophen  (TYLENOL ) 500 MG tablet Take 1,000 mg by mouth every 6 (six) hours as needed for mild pain.     aspirin  EC 325 MG tablet Take 1 tablet (325 mg total) by mouth daily. 30 tablet 0   atorvastatin  (LIPITOR ) 80 MG tablet Take 80 mg by mouth daily.     furosemide  (LASIX ) 20 MG tablet Take 1 tablet (20 mg total) by mouth daily. 30 tablet 2   HUMIRA , 2 PEN, 40 MG/0.4ML pen INJECT 40 MG INTO THE  SKIN EVERY 14 DAYS 6 each 0   levothyroxine  (SYNTHROID ) 50 MCG tablet Take 50 mcg by mouth daily before breakfast.     sildenafil  (REVATIO ) 20 MG tablet Take 1 tablet (20 mg total) by mouth 3 (three) times daily. 90 tablet 3   traZODone (DESYREL) 100 MG tablet Take 100 mg by mouth at bedtime.     No current facility-administered medications for this encounter.   Allergies  Allergen Reactions   Lisinopril  Nausea Only   Social History   Socioeconomic History   Marital status: Married    Spouse name: Not on file   Number of children: Not on file   Years of education: Not on file   Highest education level: Not on file  Occupational History   Not on file  Tobacco Use   Smoking status: Every Day    Current packs/day: 0.50    Average packs/day: 0.5 packs/day for 32.0 years (16.0 ttl pk-yrs)    Types: Cigarettes   Smokeless tobacco: Never  Vaping Use   Vaping status: Never Used  Substance and Sexual Activity   Alcohol use: No   Drug use: Yes    Types: Marijuana    Comment: occ   Sexual activity: Not Currently    Birth control/protection: Post-menopausal  Other Topics Concern   Not on file  Social History Narrative   Grew up in [place], [place].   Engaged to be married.   5 children.    Eats  mainly meat/junk food.   Social Drivers of Corporate investment banker Strain: Low Risk  (04/07/2023)   Received [facility]   Overall Financial Resource Strain (CARDIA)    Difficulty of Paying Living Expenses: Not hard at all  Food Insecurity: No Food Insecurity (07/09/2023)   Hunger Vital Sign    Worried About Running Out of Food in the Last Year: Never true    Ran Out of Food in the Last Year: Never true  Transportation Needs: No Transportation Needs (07/09/2023)   PRAPARE - Administrator, Civil Service (Medical): No    Lack of Transportation (Non-Medical): No  Physical Activity: Sufficiently Active (08/31/2021)   Exercise Vital Sign    Days of Exercise per Week: 3  days    Minutes of Exercise per Session: 60 min  Recent Concern: Physical Activity - Inactive (07/07/2021)   Received from [facility]   Exercise Vital Sign    Days of Exercise per Week: 0 days    Minutes of Exercise per Session: 0 min  Stress: No Stress Concern Present (08/31/2021)   [employer] of Occupational Health - Occupational Stress Questionnaire    Feeling of Stress : Not at all  Social Connections: Unknown (07/08/2022)   Received from [facility], [facility]   Social Network    Social Network: Not on file  Intimate Partner Violence: Not At Risk (07/09/2023)   Humiliation, Afraid, Rape, and Kick questionnaire    Fear of Current or Ex-Partner: No    Emotionally Abused: No    Physically Abused: No    Sexually Abused: No   Family History  Problem Relation Age of Onset   Hypertension Mother    Liver disease Mother    Heart attack Father    HIV Sister    Arrhythmia Sister    Mood Disorder Sister    Arrhythmia Brother    CAD Brother    Wt Readings from Last 3 Encounters:  07/16/23 104.3 kg (229 lb 15 oz)  02/22/23 108.9 kg (240 lb)  11/22/22 128.4 kg (283 lb)   BP 90/68   Pulse 97   LMP 07/29/2012   SpO2 95%   PHYSICAL EXAM: General:  NAD. No resp difficulty, arrived in [facility], chronically-ill appearing HEENT: Normal Neck: Supple. No JVD. Cor: Regular rate & rhythm. No rubs, gallops or murmurs. Lungs: Diminished in all lobes Abdomen: Soft, obese, nontender, nondistended.  Extremities: No cyanosis, clubbing, rash, edema Neuro: Alert & oriented x 3, moves all 4 extremities w/o difficulty. Affect pleasant.  ECG (personally reviewed): NSR 94 bpm  ReDs reading: 33 %, normal  ASSESSMENT & PLAN: 1. Pulmonary hypertensin / RV failure - Echo with preserved LVEF and severely reduced RV. Uncleared etiology. Suspect underlying PH. CT of chest w/ enlarged central PA suggesting PAH. CTA negative for PE. BNP 566.  - RHC 4/25: RA 4, PCWP 13, mPA 44 and  PVR of 6.4 with CI of 2.4 L/min/m2.  - Patient spends a lot of her time in bed or in a wheelchair due to hx of CVA. She also has a long hx of tobacco use. CT chest does not show severe emphysema. In addition she has seropositive RA. Will needs labs from her rheumatologist.  - V/Q scans negative.  - Will need PFTs to further evaluate for COPD (CT chest benign). Daughter would like to hold off arranging PFTs due to Shakayla's weakness. - She appears on the dry side, ReDs 33% =>decrease Lasix   20 mg to MWF - Add midodrine 2.5 mg tid for RV and BP support. - Discussed with Dr. Bruce Caper, stop sildenafil . - Treatment options will be limited by her poor baseline functional status.  - Labs today   2. Hypothyroidism  - on Levothyroxine   - Per PCP   3. Seropositive RA - on Humira  and methotrexate  - Followed by Dr. Rodell Citrin    4. Debility - significantly deconditioned - no change  Follow up in 8 weeks with Dr. Mena Stai, FNP-BC 08/07/23

## 2023-08-07 ENCOUNTER — Encounter (HOSPITAL_COMMUNITY): Payer: Self-pay

## 2023-08-07 ENCOUNTER — Telehealth: Payer: Self-pay | Admitting: Physician Assistant

## 2023-08-07 ENCOUNTER — Ambulatory Visit (HOSPITAL_COMMUNITY)
Admission: RE | Admit: 2023-08-07 | Discharge: 2023-08-07 | Disposition: A | Discharge status: Home / Self Care | Source: Ambulatory Visit | Attending: Family Medicine | Admitting: Family Medicine

## 2023-08-07 VITALS — BP 90/68 | HR 97

## 2023-08-07 DIAGNOSIS — I110 Hypertensive heart disease with heart failure: Secondary | ICD-10-CM | POA: Diagnosis not present

## 2023-08-07 DIAGNOSIS — D649 Anemia, unspecified: Secondary | ICD-10-CM | POA: Insufficient documentation

## 2023-08-07 DIAGNOSIS — R0602 Shortness of breath: Secondary | ICD-10-CM | POA: Diagnosis present

## 2023-08-07 DIAGNOSIS — Z79890 Hormone replacement therapy (postmenopausal): Secondary | ICD-10-CM | POA: Insufficient documentation

## 2023-08-07 DIAGNOSIS — M059 Rheumatoid arthritis with rheumatoid factor, unspecified: Secondary | ICD-10-CM | POA: Diagnosis not present

## 2023-08-07 DIAGNOSIS — E039 Hypothyroidism, unspecified: Secondary | ICD-10-CM | POA: Insufficient documentation

## 2023-08-07 DIAGNOSIS — Z79631 Long term (current) use of antimetabolite agent: Secondary | ICD-10-CM | POA: Insufficient documentation

## 2023-08-07 DIAGNOSIS — R5381 Other malaise: Secondary | ICD-10-CM | POA: Insufficient documentation

## 2023-08-07 DIAGNOSIS — I2729 Other secondary pulmonary hypertension: Secondary | ICD-10-CM | POA: Diagnosis not present

## 2023-08-07 DIAGNOSIS — F17210 Nicotine dependence, cigarettes, uncomplicated: Secondary | ICD-10-CM | POA: Diagnosis not present

## 2023-08-07 DIAGNOSIS — I50810 Right heart failure, unspecified: Secondary | ICD-10-CM | POA: Diagnosis not present

## 2023-08-07 DIAGNOSIS — I69354 Hemiplegia and hemiparesis following cerebral infarction affecting left non-dominant side: Secondary | ICD-10-CM | POA: Diagnosis not present

## 2023-08-07 DIAGNOSIS — R29898 Other symptoms and signs involving the musculoskeletal system: Secondary | ICD-10-CM | POA: Diagnosis not present

## 2023-08-07 DIAGNOSIS — M069 Rheumatoid arthritis, unspecified: Secondary | ICD-10-CM

## 2023-08-07 DIAGNOSIS — I272 Pulmonary hypertension, unspecified: Secondary | ICD-10-CM | POA: Diagnosis not present

## 2023-08-07 LAB — COMPREHENSIVE METABOLIC PANEL WITH GFR
ALT: 58 U/L — ABNORMAL HIGH (ref 0–44)
AST: 193 U/L — ABNORMAL HIGH (ref 15–41)
Albumin: 2.2 g/dL — ABNORMAL LOW (ref 3.5–5.0)
Alkaline Phosphatase: 50 U/L (ref 38–126)
Anion gap: 10 (ref 5–15)
BUN: 22 mg/dL — ABNORMAL HIGH (ref 6–20)
CO2: 25 mmol/L (ref 22–32)
Calcium: 8.6 mg/dL — ABNORMAL LOW (ref 8.9–10.3)
Chloride: 97 mmol/L — ABNORMAL LOW (ref 98–111)
Creatinine, Ser: 0.87 mg/dL (ref 0.44–1.00)
GFR, Estimated: >60 mL/min (ref >60)
Glucose, Bld: 72 mg/dL (ref 70–99)
Potassium: 4 mmol/L (ref 3.5–5.1)
Sodium: 132 mmol/L — ABNORMAL LOW (ref 135–145)
Total Bilirubin: 0.8 mg/dL (ref 0.0–1.2)
Total Protein: 11.2 g/dL — ABNORMAL HIGH (ref 6.5–8.1)

## 2023-08-07 LAB — CBC
HCT: 45.8 % (ref 36.0–46.0)
Hemoglobin: 13.7 g/dL (ref 12.0–15.0)
MCH: 26.9 pg (ref 26.0–34.0)
MCHC: 29.9 g/dL — ABNORMAL LOW (ref 30.0–36.0)
MCV: 90 fL (ref 80.0–100.0)
Platelets: 190 10*3/uL (ref 150–400)
RBC: 5.09 MIL/uL (ref 3.87–5.11)
RDW: 17.8 % — ABNORMAL HIGH (ref 11.5–15.5)
WBC: 1.8 10*3/uL — ABNORMAL LOW (ref 4.0–10.5)
nRBC: 1.1 % — ABNORMAL HIGH (ref 0.0–0.2)

## 2023-08-07 MED ORDER — FUROSEMIDE 20 MG PO TABS: 20.0000 mg | ORAL_TABLET | ORAL | 8 refills | Status: DC

## 2023-08-07 MED ORDER — MIDODRINE HCL 2.5 MG PO TABS: 2.5000 mg | ORAL_TABLET | Freq: Three times a day (TID) | ORAL | 8 refills | Status: DC

## 2023-08-07 NOTE — Patient Instructions (Addendum)
 Thank you for coming in today  If you had labs drawn today, any labs that are abnormal the clinic will call you No news is good news  Medications: Change Lasix  to 20 mg every Monday Wednesday and Friday  Start Midodrine 2.5 mg 1 tablet three times a day Stop SIDENAFIL   Follow up appointments:  Your physician recommends that you schedule a follow-up appointment in:  6-8 weeks With Dr. Bruce Caper    Do the following things EVERYDAY: Weigh yourself in the morning before breakfast. Write it down and keep it in a log. Take your medicines as prescribed Eat low salt foods--Limit salt (sodium) to 2000 mg per day.  Stay as active as you can everyday Limit all fluids for the day to less than 2 liters   At the [facility], you and your health needs are our priority. As part of our continuing mission to provide you with exceptional heart care, we have created designated Provider Care Teams. These Care Teams include your primary Cardiologist (physician) and Advanced Practice Providers (APPs- Physician Assistants and Nurse Practitioners) who all work together to provide you with the care you need, when you need it.   You may see any of the following providers on your designated Care Team at your next follow up: Dr Jules Oar Dr Peder Bourdon Dr. Mimi Alt, NP Ruddy Corral, [place] [facility] [place], [place] Dennise Fitz, NP Luster Salters, PharmD   Please be sure to bring in all your medications bottles to every appointment.    Thank you for choosing [facility]  If you have any questions or concerns before your next appointment please send us  a message through Anna or call our office at [phone].    TO LEAVE A MESSAGE FOR THE NURSE SELECT OPTION 2, PLEASE LEAVE A MESSAGE INCLUDING: YOUR NAME DATE OF BIRTH CALL BACK NUMBER REASON FOR CALL**this is important as we prioritize the call  backs  YOU WILL RECEIVE A CALL BACK THE SAME DAY AS LONG AS YOU CALL BEFORE 4:00 PM

## 2023-08-07 NOTE — Progress Notes (Signed)
 ReDS Vest / Clip - 08/07/23 1400       ReDS Vest / Clip   Station Marker D    Ruler Value 32    ReDS Value Range Low volume    ReDS Actual Value 33

## 2023-08-07 NOTE — Telephone Encounter (Signed)
 Received page from [facility] with [facility] Lab called after hours answering service to notify lab issue. Per the page, "Specimen was ordered for a test but sample is not sufficient to perform it. Needs to give credit and have you reorder." I tried to call the number back provided but it just rang without going to voicemail. I ultimately called the operator and main lab and was able to connect with Mae. She reports that specimen was not sufficient/not enough to run BNP. They will credit back to the account. It will need to be re-ordered/re-drawn tomorrow if still needed. I will route to [facility] for review/action in AM.

## 2023-08-08 ENCOUNTER — Telehealth (HOSPITAL_COMMUNITY): Payer: Self-pay

## 2023-08-08 NOTE — Telephone Encounter (Signed)
 Spoke with patients daughter (okay per DPR) advised her of the results and she verbalized understanding, will make patient aware that she needs to stop atorvastatin . Per patients daughter patient is a very hard stick so she does not believe that repeat blood work would be able to be completed. She would like to check with [name], NP to see what she recommends. Patients daughter also concerned about decrease in WBC- I advised that this was stable/similar to previous WBC in the past month- advised daughter to follow with PCP regarding this. Patients daughter states that patient was told to follow up with HF clinic about WBC- advised her I would forward message over to Leary, NP

## 2023-08-08 NOTE — Telephone Encounter (Signed)
-----   Message from Elmarie Hacking sent at 08/08/2023  8:07 AM EDT ----- LFTs elevated. Stop atorvastatin  for the time being.  She needs  a BNP, lab was unable to run this with sample sent.  I will forward her labs to her PCP

## 2023-08-09 NOTE — Telephone Encounter (Signed)
 Addressed in separate encounter.

## 2023-08-11 ENCOUNTER — Encounter (HOSPITAL_COMMUNITY): Payer: Self-pay | Admitting: Cardiology

## 2023-08-11 NOTE — Telephone Encounter (Signed)
  She has RA and followed by [facility], has a hx of long-term use of Humira  and methotrexate . Leukopenia can be a side effect of RA and RA treatments. Needs to follow up with rheumatology, I will forward her labs to Dr. Rodell Citrin  She needs regular lab monitoring due to her Pulm HTN and R sided HF in order to safely treat her.   Called and spoke with patient's daughter and made her aware of the above. Patient's daughter states that she is going to try and get blood work drawn by home health- advised her to let us  know and we could fax order if needed. Patients daughter make us  aware if needed.

## 2023-08-14 ENCOUNTER — Inpatient Hospital Stay (HOSPITAL_COMMUNITY)
Admission: EM | Admit: 2023-08-14 | Discharge: 2023-08-26 | DRG: 291 | Disposition: A | Discharge status: Hospice | Attending: Internal Medicine | Admitting: Internal Medicine

## 2023-08-14 ENCOUNTER — Emergency Department (HOSPITAL_COMMUNITY)

## 2023-08-14 ENCOUNTER — Other Ambulatory Visit: Payer: Self-pay

## 2023-08-14 ENCOUNTER — Encounter (HOSPITAL_COMMUNITY): Payer: Self-pay

## 2023-08-14 DIAGNOSIS — I2721 Secondary pulmonary arterial hypertension: Secondary | ICD-10-CM | POA: Diagnosis present

## 2023-08-14 DIAGNOSIS — E782 Mixed hyperlipidemia: Secondary | ICD-10-CM | POA: Diagnosis present

## 2023-08-14 DIAGNOSIS — Z888 Allergy status to other drugs, medicaments and biological substances status: Secondary | ICD-10-CM

## 2023-08-14 DIAGNOSIS — I10 Essential (primary) hypertension: Secondary | ICD-10-CM | POA: Diagnosis present

## 2023-08-14 DIAGNOSIS — Z66 Do not resuscitate: Secondary | ICD-10-CM | POA: Diagnosis not present

## 2023-08-14 DIAGNOSIS — B9620 Unspecified Escherichia coli [E. coli] as the cause of diseases classified elsewhere: Secondary | ICD-10-CM | POA: Diagnosis present

## 2023-08-14 DIAGNOSIS — I272 Pulmonary hypertension, unspecified: Secondary | ICD-10-CM | POA: Diagnosis present

## 2023-08-14 DIAGNOSIS — R570 Cardiogenic shock: Secondary | ICD-10-CM | POA: Diagnosis present

## 2023-08-14 DIAGNOSIS — Z79631 Long term (current) use of antimetabolite agent: Secondary | ICD-10-CM

## 2023-08-14 DIAGNOSIS — I5033 Acute on chronic diastolic (congestive) heart failure: Secondary | ICD-10-CM | POA: Diagnosis present

## 2023-08-14 DIAGNOSIS — M79672 Pain in left foot: Secondary | ICD-10-CM | POA: Diagnosis not present

## 2023-08-14 DIAGNOSIS — I2489 Other forms of acute ischemic heart disease: Secondary | ICD-10-CM | POA: Diagnosis present

## 2023-08-14 DIAGNOSIS — Z7401 Bed confinement status: Secondary | ICD-10-CM

## 2023-08-14 DIAGNOSIS — R7401 Elevation of levels of liver transaminase levels: Secondary | ICD-10-CM | POA: Diagnosis present

## 2023-08-14 DIAGNOSIS — N390 Urinary tract infection, site not specified: Secondary | ICD-10-CM | POA: Diagnosis present

## 2023-08-14 DIAGNOSIS — Z993 Dependence on wheelchair: Secondary | ICD-10-CM

## 2023-08-14 DIAGNOSIS — I639 Cerebral infarction, unspecified: Secondary | ICD-10-CM | POA: Diagnosis present

## 2023-08-14 DIAGNOSIS — I130 Hypertensive heart and chronic kidney disease with heart failure and stage 1 through stage 4 chronic kidney disease, or unspecified chronic kidney disease: Principal | ICD-10-CM | POA: Diagnosis present

## 2023-08-14 DIAGNOSIS — I69318 Other symptoms and signs involving cognitive functions following cerebral infarction: Secondary | ICD-10-CM

## 2023-08-14 DIAGNOSIS — E876 Hypokalemia: Secondary | ICD-10-CM | POA: Diagnosis not present

## 2023-08-14 DIAGNOSIS — N3090 Cystitis, unspecified without hematuria: Secondary | ICD-10-CM | POA: Diagnosis present

## 2023-08-14 DIAGNOSIS — E66812 Obesity, class 2: Secondary | ICD-10-CM | POA: Diagnosis present

## 2023-08-14 DIAGNOSIS — N1831 Chronic kidney disease, stage 3a: Secondary | ICD-10-CM | POA: Diagnosis present

## 2023-08-14 DIAGNOSIS — I951 Orthostatic hypotension: Secondary | ICD-10-CM | POA: Diagnosis present

## 2023-08-14 DIAGNOSIS — E871 Hypo-osmolality and hyponatremia: Secondary | ICD-10-CM | POA: Diagnosis present

## 2023-08-14 DIAGNOSIS — M059 Rheumatoid arthritis with rheumatoid factor, unspecified: Secondary | ICD-10-CM | POA: Diagnosis present

## 2023-08-14 DIAGNOSIS — F17210 Nicotine dependence, cigarettes, uncomplicated: Secondary | ICD-10-CM | POA: Diagnosis present

## 2023-08-14 DIAGNOSIS — Z7189 Other specified counseling: Secondary | ICD-10-CM

## 2023-08-14 DIAGNOSIS — K7200 Acute and subacute hepatic failure without coma: Secondary | ICD-10-CM | POA: Diagnosis present

## 2023-08-14 DIAGNOSIS — I5084 End stage heart failure: Secondary | ICD-10-CM | POA: Diagnosis present

## 2023-08-14 DIAGNOSIS — I69322 Dysarthria following cerebral infarction: Secondary | ICD-10-CM

## 2023-08-14 DIAGNOSIS — I509 Heart failure, unspecified: Principal | ICD-10-CM

## 2023-08-14 DIAGNOSIS — Z8249 Family history of ischemic heart disease and other diseases of the circulatory system: Secondary | ICD-10-CM

## 2023-08-14 DIAGNOSIS — Z1152 Encounter for screening for COVID-19: Secondary | ICD-10-CM

## 2023-08-14 DIAGNOSIS — E875 Hyperkalemia: Secondary | ICD-10-CM | POA: Diagnosis present

## 2023-08-14 DIAGNOSIS — I69320 Aphasia following cerebral infarction: Secondary | ICD-10-CM

## 2023-08-14 DIAGNOSIS — Z5329 Procedure and treatment not carried out because of patient's decision for other reasons: Secondary | ICD-10-CM | POA: Diagnosis not present

## 2023-08-14 DIAGNOSIS — J45909 Unspecified asthma, uncomplicated: Secondary | ICD-10-CM | POA: Diagnosis present

## 2023-08-14 DIAGNOSIS — L89152 Pressure ulcer of sacral region, stage 2: Secondary | ICD-10-CM | POA: Diagnosis present

## 2023-08-14 DIAGNOSIS — I50813 Acute on chronic right heart failure: Secondary | ICD-10-CM

## 2023-08-14 DIAGNOSIS — E872 Acidosis, unspecified: Secondary | ICD-10-CM | POA: Diagnosis present

## 2023-08-14 DIAGNOSIS — Z7982 Long term (current) use of aspirin: Secondary | ICD-10-CM

## 2023-08-14 DIAGNOSIS — R791 Abnormal coagulation profile: Secondary | ICD-10-CM | POA: Diagnosis present

## 2023-08-14 DIAGNOSIS — Z720 Tobacco use: Secondary | ICD-10-CM | POA: Diagnosis present

## 2023-08-14 DIAGNOSIS — E039 Hypothyroidism, unspecified: Secondary | ICD-10-CM | POA: Diagnosis present

## 2023-08-14 DIAGNOSIS — I4892 Unspecified atrial flutter: Secondary | ICD-10-CM

## 2023-08-14 DIAGNOSIS — R627 Adult failure to thrive: Secondary | ICD-10-CM | POA: Diagnosis present

## 2023-08-14 DIAGNOSIS — G9341 Metabolic encephalopathy: Secondary | ICD-10-CM | POA: Diagnosis present

## 2023-08-14 DIAGNOSIS — I69354 Hemiplegia and hemiparesis following cerebral infarction affecting left non-dominant side: Secondary | ICD-10-CM

## 2023-08-14 DIAGNOSIS — E162 Hypoglycemia, unspecified: Secondary | ICD-10-CM | POA: Diagnosis not present

## 2023-08-14 DIAGNOSIS — Z6839 Body mass index (BMI) 39.0-39.9, adult: Secondary | ICD-10-CM

## 2023-08-14 DIAGNOSIS — I4719 Other supraventricular tachycardia: Secondary | ICD-10-CM | POA: Diagnosis present

## 2023-08-14 DIAGNOSIS — D696 Thrombocytopenia, unspecified: Secondary | ICD-10-CM | POA: Diagnosis present

## 2023-08-14 DIAGNOSIS — N179 Acute kidney failure, unspecified: Secondary | ICD-10-CM | POA: Diagnosis present

## 2023-08-14 DIAGNOSIS — Z79890 Hormone replacement therapy (postmenopausal): Secondary | ICD-10-CM

## 2023-08-14 DIAGNOSIS — Z79899 Other long term (current) drug therapy: Secondary | ICD-10-CM

## 2023-08-14 MED ORDER — IPRATROPIUM-ALBUTEROL 0.5-2.5 (3) MG/3ML IN SOLN
3.0000 mL | Freq: Once | RESPIRATORY_TRACT | Status: AC
  Administered 2023-08-14: 3 mL via RESPIRATORY_TRACT
  Filled 2023-08-14: qty 3

## 2023-08-14 MED ORDER — METHYLPREDNISOLONE SODIUM SUCC 125 MG IJ SOLR
125.0000 mg | Freq: Once | INTRAMUSCULAR | Status: AC
  Administered 2023-08-14: 125 mg via INTRAVENOUS
  Filled 2023-08-14: qty 2

## 2023-08-14 NOTE — ED Triage Notes (Signed)
 Pt arrives EMS from home with reports of [facility] and generalized weakness for the last week. Pt alert and oriented on arrival and reports hx of CHF

## 2023-08-14 NOTE — ED Provider Notes (Signed)
 [place] EMERGENCY DEPARTMENT AT [facility] Provider Note   CSN: [id number] Arrival date & time: 08/14/23  2253     History {Add pertinent medical, surgical, social history, OB history to HPI:1} No chief complaint on file.   [name] is a 58 y.o. female.  HPI Patient presents for generalized weakness and shortness of breath.  Medical history includes HTN, HLD, CVA, arthritis, asthma, CHF, pulmonary hypertension.  She had a hospital admission 1 month ago for shortness of breath secondary to CHF exacerbation.  Husband reports that she has been weak since that time.  She has chronic left-sided deficits from prior CVA.  She has had progressive shortness of breath.  She states that she has been adherent to her home medications.    Home Medications Prior to Admission medications   Medication Sig Start Date End Date Taking? Authorizing Provider  acetaminophen  (TYLENOL ) 500 MG tablet Take 1,000 mg by mouth every 6 (six) hours as needed for mild pain.    [provider]  aspirin  EC 325 MG tablet Take 1 tablet (325 mg total) by mouth daily. 08/24/17   Dorena Gander, MD  furosemide  (LASIX ) 20 MG tablet Take 1 tablet (20 mg total) by mouth every Monday, Wednesday, and Friday. 08/07/23   [place], Arlice Bene, FNP  HUMIRA , 2 PEN, 40 MG/0.4ML pen INJECT 40 MG INTO THE SKIN EVERY 14 DAYS 07/17/23   Rice, Haig Levan, MD  levothyroxine  (SYNTHROID ) 50 MCG tablet Take 50 mcg by mouth daily before breakfast.    [provider]  midodrine  (PROAMATINE ) 2.5 MG tablet Take 1 tablet (2.5 mg total) by mouth 3 (three) times daily with meals. 08/07/23   Elmarie Hacking, FNP  traZODone (DESYREL) 100 MG tablet Take 100 mg by mouth at bedtime. 05/09/22   [provider]      Allergies    Lisinopril     Review of Systems   Review of Systems  Constitutional:  Positive for activity change and appetite change.  Respiratory:  Positive for shortness of breath.   Neurological:   Positive for weakness (Generalized).  All other systems reviewed and are negative.   Physical Exam Updated Vital Signs LMP 07/29/2012  Physical Exam Vitals and nursing note reviewed.  Constitutional:      General: She is not in acute distress.    Appearance: She is well-developed. She is ill-appearing (Chronically). She is not toxic-appearing or diaphoretic.  HENT:     Head: Normocephalic and atraumatic.  Eyes:     Conjunctiva/sclera: Conjunctivae normal.  Cardiovascular:     Rate and Rhythm: Regular rhythm. Tachycardia present.     Heart sounds: No murmur heard. Pulmonary:     Effort: Pulmonary effort is normal. Tachypnea present. No respiratory distress.     Breath sounds: Decreased air movement present. Decreased breath sounds present.  Chest:     Chest wall: No tenderness.  Abdominal:     Palpations: Abdomen is soft.     Tenderness: There is no abdominal tenderness.  Musculoskeletal:        General: No swelling.     Cervical back: Normal range of motion and neck supple.     Right lower leg: No edema.     Left lower leg: No edema.  Skin:    General: Skin is warm and dry.  Neurological:     Mental Status: She is alert.     Motor: Weakness (Chronic left hemibody deficits) present.  Psychiatric:  Mood and Affect: Mood normal.        Behavior: Behavior normal.     ED Results / Procedures / Treatments   Labs (all labs ordered are listed, but only abnormal results are displayed) Labs Reviewed - No data to display  EKG None  Radiology No results found.  Procedures Procedures  {Document cardiac monitor, telemetry assessment procedure when appropriate:1}  Medications Ordered in ED Medications - No data to display  ED Course/ Medical Decision Making/ A&P   {   Click here for ABCD2, HEART and other calculatorsREFRESH Note before signing :1}                              Medical Decision Making  This patient presents to the ED for concern of ***, this  involves an extensive number of treatment options, and is a complaint that carries with it a high risk of complications and morbidity.  The differential diagnosis includes ***   Co morbidities that complicate the patient evaluation  ***   Additional history obtained:  Additional history obtained from *** External records from outside source obtained and reviewed including ***   Lab Tests:  I Ordered, and personally interpreted labs.  The pertinent results include:  ***   Imaging Studies ordered:  I ordered imaging studies including ***  I independently visualized and interpreted imaging which showed *** I agree with the radiologist interpretation   Cardiac Monitoring: / EKG:  The patient was maintained on a cardiac monitor.  I personally viewed and interpreted the cardiac monitored which showed an underlying rhythm of: ***   Problem List / ED Course / Critical interventions / Medication management  *** I ordered medication including ***  for ***  Reevaluation of the patient after these medicines showed that the patient {resolved/improved/worsened:23923::"improved"} I have reviewed the patients home medicines and have made adjustments as needed   Consultations Obtained:  I requested consultation with the ***,  and discussed lab and imaging findings as well as pertinent plan - they recommend: ***   Social Determinants of Health:  ***   Test / Admission - Considered:  ***    {Document critical care time when appropriate:1} {Document review of labs and clinical decision tools ie heart score, Chads2Vasc2 etc:1}  {Document your independent review of radiology images, and any outside records:1} {Document your discussion with family members, caretakers, and with consultants:1} {Document social determinants of health affecting pt's care:1} {Document your decision making why or why not admission, treatments were needed:1} Final Clinical Impression(s) / ED  Diagnoses Final diagnoses:  None    Rx / DC Orders ED Discharge Orders     None

## 2023-08-15 ENCOUNTER — Encounter (HOSPITAL_COMMUNITY): Payer: Self-pay | Admitting: Internal Medicine

## 2023-08-15 ENCOUNTER — Other Ambulatory Visit: Payer: Self-pay

## 2023-08-15 DIAGNOSIS — I69354 Hemiplegia and hemiparesis following cerebral infarction affecting left non-dominant side: Secondary | ICD-10-CM | POA: Diagnosis not present

## 2023-08-15 DIAGNOSIS — J45909 Unspecified asthma, uncomplicated: Secondary | ICD-10-CM | POA: Diagnosis present

## 2023-08-15 DIAGNOSIS — E162 Hypoglycemia, unspecified: Secondary | ICD-10-CM | POA: Diagnosis not present

## 2023-08-15 DIAGNOSIS — E782 Mixed hyperlipidemia: Secondary | ICD-10-CM | POA: Diagnosis not present

## 2023-08-15 DIAGNOSIS — L89152 Pressure ulcer of sacral region, stage 2: Secondary | ICD-10-CM | POA: Diagnosis present

## 2023-08-15 DIAGNOSIS — Z720 Tobacco use: Secondary | ICD-10-CM | POA: Diagnosis not present

## 2023-08-15 DIAGNOSIS — E66812 Obesity, class 2: Secondary | ICD-10-CM

## 2023-08-15 DIAGNOSIS — Z515 Encounter for palliative care: Secondary | ICD-10-CM

## 2023-08-15 DIAGNOSIS — E872 Acidosis, unspecified: Secondary | ICD-10-CM | POA: Diagnosis present

## 2023-08-15 DIAGNOSIS — I5033 Acute on chronic diastolic (congestive) heart failure: Secondary | ICD-10-CM | POA: Insufficient documentation

## 2023-08-15 DIAGNOSIS — Z66 Do not resuscitate: Secondary | ICD-10-CM | POA: Diagnosis not present

## 2023-08-15 DIAGNOSIS — R7401 Elevation of levels of liver transaminase levels: Secondary | ICD-10-CM

## 2023-08-15 DIAGNOSIS — I5082 Biventricular heart failure: Secondary | ICD-10-CM | POA: Diagnosis not present

## 2023-08-15 DIAGNOSIS — I50813 Acute on chronic right heart failure: Secondary | ICD-10-CM

## 2023-08-15 DIAGNOSIS — I509 Heart failure, unspecified: Secondary | ICD-10-CM

## 2023-08-15 DIAGNOSIS — R7989 Other specified abnormal findings of blood chemistry: Secondary | ICD-10-CM

## 2023-08-15 DIAGNOSIS — R570 Cardiogenic shock: Secondary | ICD-10-CM | POA: Diagnosis present

## 2023-08-15 DIAGNOSIS — D696 Thrombocytopenia, unspecified: Secondary | ICD-10-CM | POA: Diagnosis present

## 2023-08-15 DIAGNOSIS — G9341 Metabolic encephalopathy: Secondary | ICD-10-CM | POA: Diagnosis not present

## 2023-08-15 DIAGNOSIS — I2721 Secondary pulmonary arterial hypertension: Secondary | ICD-10-CM | POA: Diagnosis present

## 2023-08-15 DIAGNOSIS — I10 Essential (primary) hypertension: Secondary | ICD-10-CM

## 2023-08-15 DIAGNOSIS — E871 Hypo-osmolality and hyponatremia: Secondary | ICD-10-CM | POA: Diagnosis present

## 2023-08-15 DIAGNOSIS — I639 Cerebral infarction, unspecified: Secondary | ICD-10-CM

## 2023-08-15 DIAGNOSIS — N3000 Acute cystitis without hematuria: Secondary | ICD-10-CM

## 2023-08-15 DIAGNOSIS — Z7189 Other specified counseling: Secondary | ICD-10-CM

## 2023-08-15 DIAGNOSIS — I2489 Other forms of acute ischemic heart disease: Secondary | ICD-10-CM | POA: Diagnosis present

## 2023-08-15 DIAGNOSIS — E039 Hypothyroidism, unspecified: Secondary | ICD-10-CM | POA: Diagnosis present

## 2023-08-15 DIAGNOSIS — I5084 End stage heart failure: Secondary | ICD-10-CM | POA: Diagnosis present

## 2023-08-15 DIAGNOSIS — I4892 Unspecified atrial flutter: Secondary | ICD-10-CM | POA: Diagnosis present

## 2023-08-15 DIAGNOSIS — N179 Acute kidney failure, unspecified: Secondary | ICD-10-CM | POA: Diagnosis not present

## 2023-08-15 DIAGNOSIS — I272 Pulmonary hypertension, unspecified: Secondary | ICD-10-CM | POA: Diagnosis not present

## 2023-08-15 DIAGNOSIS — I5021 Acute systolic (congestive) heart failure: Secondary | ICD-10-CM | POA: Diagnosis not present

## 2023-08-15 DIAGNOSIS — I130 Hypertensive heart and chronic kidney disease with heart failure and stage 1 through stage 4 chronic kidney disease, or unspecified chronic kidney disease: Secondary | ICD-10-CM | POA: Diagnosis present

## 2023-08-15 DIAGNOSIS — R0609 Other forms of dyspnea: Secondary | ICD-10-CM | POA: Diagnosis not present

## 2023-08-15 DIAGNOSIS — I4719 Other supraventricular tachycardia: Secondary | ICD-10-CM | POA: Diagnosis present

## 2023-08-15 DIAGNOSIS — N390 Urinary tract infection, site not specified: Secondary | ICD-10-CM | POA: Diagnosis present

## 2023-08-15 DIAGNOSIS — M059 Rheumatoid arthritis with rheumatoid factor, unspecified: Secondary | ICD-10-CM | POA: Diagnosis present

## 2023-08-15 DIAGNOSIS — N1831 Chronic kidney disease, stage 3a: Secondary | ICD-10-CM | POA: Diagnosis present

## 2023-08-15 DIAGNOSIS — K7200 Acute and subacute hepatic failure without coma: Secondary | ICD-10-CM | POA: Diagnosis present

## 2023-08-15 DIAGNOSIS — I2609 Other pulmonary embolism with acute cor pulmonale: Secondary | ICD-10-CM | POA: Diagnosis not present

## 2023-08-15 DIAGNOSIS — Z1152 Encounter for screening for COVID-19: Secondary | ICD-10-CM | POA: Diagnosis not present

## 2023-08-15 LAB — BLOOD GAS, VENOUS
Acid-Base Excess: 3.9 mmol/L — ABNORMAL HIGH (ref 0.0–2.0)
Bicarbonate: 30.2 mmol/L — ABNORMAL HIGH (ref 20.0–28.0)
Drawn by: 1528
O2 Saturation: 27.8 %
Patient temperature: 36.6
pCO2, Ven: 50 mmHg (ref 44–60)
pH, Ven: 7.39 (ref 7.25–7.43)
pO2, Ven: <31 mmHg — CL (ref 32–45)

## 2023-08-15 LAB — CBC WITH DIFFERENTIAL/PLATELET
Abs Immature Granulocytes: 0 10*3/uL (ref 0.00–0.07)
Band Neutrophils: 2 %
Basophils Absolute: 0 10*3/uL (ref 0.0–0.1)
Basophils Relative: 1 %
Eosinophils Absolute: 0 10*3/uL (ref 0.0–0.5)
Eosinophils Relative: 0 %
HCT: 43.9 % (ref 36.0–46.0)
Hemoglobin: 13.8 g/dL (ref 12.0–15.0)
Lymphocytes Relative: 29 %
Lymphs Abs: 0.8 10*3/uL (ref 0.7–4.0)
MCH: 28.1 pg (ref 26.0–34.0)
MCHC: 31.4 g/dL (ref 30.0–36.0)
MCV: 89.4 fL (ref 80.0–100.0)
Monocytes Absolute: 0.2 10*3/uL (ref 0.1–1.0)
Monocytes Relative: 7 %
Neutro Abs: 1.8 10*3/uL (ref 1.7–7.7)
Neutrophils Relative %: 61 %
Platelets: 122 10*3/uL — ABNORMAL LOW (ref 150–400)
RBC: 4.91 MIL/uL (ref 3.87–5.11)
RDW: 18.4 % — ABNORMAL HIGH (ref 11.5–15.5)
Smear Review: DECREASED
WBC: 2.9 10*3/uL — ABNORMAL LOW (ref 4.0–10.5)
nRBC: 2 /100{WBCs} — ABNORMAL HIGH
nRBC: 4.1 % — ABNORMAL HIGH (ref 0.0–0.2)

## 2023-08-15 LAB — URINALYSIS, ROUTINE W REFLEX MICROSCOPIC
Bilirubin Urine: NEGATIVE
Glucose, UA: NEGATIVE mg/dL
Ketones, ur: NEGATIVE mg/dL
Nitrite: POSITIVE — AB
Protein, ur: 100 mg/dL — AB
Specific Gravity, Urine: 1.011 (ref 1.005–1.030)
pH: 5 (ref 5.0–8.0)

## 2023-08-15 LAB — BASIC METABOLIC PANEL WITH GFR
Anion gap: 7 (ref 5–15)
BUN: 39 mg/dL — ABNORMAL HIGH (ref 6–20)
CO2: 26 mmol/L (ref 22–32)
Calcium: 8.9 mg/dL (ref 8.9–10.3)
Chloride: 100 mmol/L (ref 98–111)
Creatinine, Ser: 1.23 mg/dL — ABNORMAL HIGH (ref 0.44–1.00)
GFR, Estimated: 51 mL/min — ABNORMAL LOW (ref >60)
Glucose, Bld: 81 mg/dL (ref 70–99)
Potassium: 4.7 mmol/L (ref 3.5–5.1)
Sodium: 133 mmol/L — ABNORMAL LOW (ref 135–145)

## 2023-08-15 LAB — HEPATITIS PANEL, ACUTE
HCV Ab: NONREACTIVE
Hep A IgM: NONREACTIVE
Hep B C IgM: NONREACTIVE
Hepatitis B Surface Ag: NONREACTIVE

## 2023-08-15 LAB — RESP PANEL BY RT-PCR (RSV, FLU A&B, COVID)  RVPGX2
Influenza A by PCR: NEGATIVE
Influenza B by PCR: NEGATIVE
Resp Syncytial Virus by PCR: NEGATIVE
SARS Coronavirus 2 by RT PCR: NEGATIVE

## 2023-08-15 LAB — LACTIC ACID, PLASMA: Lactic Acid, Venous: 3 mmol/L (ref 0.5–1.9)

## 2023-08-15 LAB — HEPATIC FUNCTION PANEL
ALT: 346 U/L — ABNORMAL HIGH (ref 0–44)
AST: 936 U/L — ABNORMAL HIGH (ref 15–41)
Albumin: 2.3 g/dL — ABNORMAL LOW (ref 3.5–5.0)
Alkaline Phosphatase: 112 U/L (ref 38–126)
Bilirubin, Direct: 0.6 mg/dL — ABNORMAL HIGH (ref 0.0–0.2)
Indirect Bilirubin: 1.2 mg/dL — ABNORMAL HIGH (ref 0.3–0.9)
Total Bilirubin: 1.8 mg/dL — ABNORMAL HIGH (ref 0.0–1.2)
Total Protein: 10.8 g/dL — ABNORMAL HIGH (ref 6.5–8.1)

## 2023-08-15 LAB — TROPONIN I (HIGH SENSITIVITY)
Troponin I (High Sensitivity): 117 ng/L (ref <18)
Troponin I (High Sensitivity): 119 ng/L (ref <18)

## 2023-08-15 LAB — BRAIN NATRIURETIC PEPTIDE: B Natriuretic Peptide: 1054 pg/mL — ABNORMAL HIGH (ref 0.0–100.0)

## 2023-08-15 LAB — MAGNESIUM: Magnesium: 2.2 mg/dL (ref 1.7–2.4)

## 2023-08-15 LAB — TSH: TSH: 5.963 u[IU]/mL — ABNORMAL HIGH (ref 0.350–4.500)

## 2023-08-15 LAB — VITAMIN B12: Vitamin B-12: 3076 pg/mL — ABNORMAL HIGH (ref 180–914)

## 2023-08-15 MED ORDER — SODIUM CHLORIDE 0.9% FLUSH: 3.0000 mL | INTRAVENOUS | Status: DC | PRN

## 2023-08-15 MED ORDER — CHLORHEXIDINE GLUCONATE CLOTH 2 % EX PADS
6.0000 | MEDICATED_PAD | Freq: Every day | CUTANEOUS | Status: DC
  Administered 2023-08-16 – 2023-08-26 (×11): 6 via TOPICAL

## 2023-08-15 MED ORDER — SODIUM CHLORIDE 0.9% FLUSH
3.0000 mL | Freq: Two times a day (BID) | INTRAVENOUS | Status: DC
  Administered 2023-08-15 – 2023-08-25 (×18): 3 mL via INTRAVENOUS

## 2023-08-15 MED ORDER — SODIUM CHLORIDE 0.9 % IV SOLN
1.0000 g | Freq: Once | INTRAVENOUS | Status: AC
  Administered 2023-08-15: 1 g via INTRAVENOUS
  Filled 2023-08-15: qty 10

## 2023-08-15 MED ORDER — IPRATROPIUM-ALBUTEROL 0.5-2.5 (3) MG/3ML IN SOLN
3.0000 mL | Freq: Four times a day (QID) | RESPIRATORY_TRACT | Status: DC | PRN
  Administered 2023-08-24: 3 mL via RESPIRATORY_TRACT
  Filled 2023-08-15: qty 3

## 2023-08-15 MED ORDER — ACETAMINOPHEN 325 MG PO TABS
650.0000 mg | ORAL_TABLET | Freq: Four times a day (QID) | ORAL | Status: DC | PRN
  Administered 2023-08-20 – 2023-08-26 (×6): 650 mg via ORAL
  Filled 2023-08-15 (×6): qty 2

## 2023-08-15 MED ORDER — BUDESONIDE 0.5 MG/2ML IN SUSP
0.5000 mg | Freq: Two times a day (BID) | RESPIRATORY_TRACT | Status: DC
  Administered 2023-08-15 – 2023-08-26 (×20): 0.5 mg via RESPIRATORY_TRACT
  Filled 2023-08-15 (×22): qty 2

## 2023-08-15 MED ORDER — ACETAMINOPHEN 650 MG RE SUPP
650.0000 mg | Freq: Four times a day (QID) | RECTAL | Status: DC | PRN
  Administered 2023-08-16: 650 mg via RECTAL
  Filled 2023-08-15: qty 1

## 2023-08-15 MED ORDER — PROCHLORPERAZINE EDISYLATE 10 MG/2ML IJ SOLN
10.0000 mg | Freq: Four times a day (QID) | INTRAMUSCULAR | Status: DC | PRN
  Administered 2023-08-16 – 2023-08-21 (×2): 10 mg via INTRAVENOUS
  Filled 2023-08-15 (×2): qty 2

## 2023-08-15 MED ORDER — FUROSEMIDE 10 MG/ML IJ SOLN
40.0000 mg | Freq: Two times a day (BID) | INTRAMUSCULAR | Status: DC
Start: 2023-08-15 — End: 2023-08-15

## 2023-08-15 MED ORDER — SODIUM CHLORIDE 0.9 % IV SOLN
1.0000 g | INTRAVENOUS | Status: DC
  Administered 2023-08-15: 1 g via INTRAVENOUS
  Filled 2023-08-15: qty 10

## 2023-08-15 MED ORDER — PRAVASTATIN SODIUM 40 MG PO TABS: 40.0000 mg | ORAL_TABLET | Freq: Every day | ORAL | Status: DC

## 2023-08-15 MED ORDER — FUROSEMIDE 10 MG/ML IJ SOLN
60.0000 mg | Freq: Once | INTRAMUSCULAR | Status: AC
  Administered 2023-08-15: 60 mg via INTRAVENOUS
  Filled 2023-08-15: qty 6

## 2023-08-15 MED ORDER — LEVOTHYROXINE SODIUM 50 MCG PO TABS
50.0000 ug | ORAL_TABLET | Freq: Every day | ORAL | Status: DC
  Administered 2023-08-15 – 2023-08-26 (×9): 50 ug via ORAL
  Filled 2023-08-15 (×10): qty 1

## 2023-08-15 MED ORDER — ASPIRIN 325 MG PO TBEC: 325.0000 mg | DELAYED_RELEASE_TABLET | Freq: Every day | ORAL | Status: DC

## 2023-08-15 MED ORDER — METHYLPREDNISOLONE SODIUM SUCC 40 MG IJ SOLR
40.0000 mg | Freq: Two times a day (BID) | INTRAMUSCULAR | Status: DC
  Administered 2023-08-15 (×2): 40 mg via INTRAVENOUS
  Filled 2023-08-15 (×2): qty 1

## 2023-08-15 MED ORDER — PANTOPRAZOLE SODIUM 40 MG PO TBEC
40.0000 mg | DELAYED_RELEASE_TABLET | Freq: Every day | ORAL | Status: DC
  Administered 2023-08-15: 40 mg via ORAL
  Filled 2023-08-15: qty 1

## 2023-08-15 MED ORDER — HYDROMORPHONE HCL 1 MG/ML IJ SOLN
0.5000 mg | INTRAMUSCULAR | Status: DC | PRN
  Administered 2023-08-15: 0.5 mg via INTRAVENOUS
  Filled 2023-08-15: qty 0.5

## 2023-08-15 MED ORDER — FUROSEMIDE 10 MG/ML IJ SOLN
60.0000 mg | Freq: Once | INTRAMUSCULAR | Status: AC
  Administered 2023-08-15: 60 mg via INTRAVENOUS

## 2023-08-15 MED ORDER — SODIUM CHLORIDE 0.9 % IV SOLN: 250.0000 mL | INTRAVENOUS | Status: AC | PRN

## 2023-08-15 MED ORDER — MIDODRINE HCL 5 MG PO TABS
2.5000 mg | ORAL_TABLET | Freq: Three times a day (TID) | ORAL | Status: DC
  Administered 2023-08-15 – 2023-08-16 (×3): 2.5 mg via ORAL
  Filled 2023-08-15 (×4): qty 1

## 2023-08-15 MED ORDER — HEPARIN SODIUM (PORCINE) 5000 UNIT/ML IJ SOLN
5000.0000 [IU] | Freq: Three times a day (TID) | INTRAMUSCULAR | Status: DC
  Administered 2023-08-15 – 2023-08-16 (×3): 5000 [IU] via SUBCUTANEOUS
  Filled 2023-08-15 (×3): qty 1

## 2023-08-15 NOTE — Assessment & Plan Note (Signed)
-  patient reporting frequency and lower abd discomfort -WA with concerns for acute UTI -will follow culture results -continue empiric therapy with rocephin  for now

## 2023-08-15 NOTE — Progress Notes (Signed)
 Lab core called and stated her hepatitis needs to be recollected. MD made aware and lab notified.

## 2023-08-15 NOTE — Progress Notes (Signed)
 PICC ordered for "vasoactive infusions," at this time no vasoactive infusions ordered. Nursing team notified that PICC team will follow up to reassess need tomorrow 08/16/23.

## 2023-08-15 NOTE — Assessment & Plan Note (Signed)
-  Body mass index is 39.87 kg/m. - Low-calorie diet, portion control and increase physical activity discussed with patient.

## 2023-08-15 NOTE — H&P (Addendum)
 History and Physical    Patient: [name] UYQ:[id number] DOB: 1965/05/17 DOA: 08/14/2023 DOS: the patient was seen and examined on 08/15/2023  PCP: [facility]   Patient coming from: Home  Chief Complaint:  Chief Complaint  Patient presents with   Weakness   Shortness of Breath   HPI: [name] is a 58 y.o. female with medical history significant of tobacco abuse, hypertension, hyperlipidemia, hypothyroidism and, history of stroke with left-sided residual deficits and dysarthria, pulmonary hypertension and right-sided heart failure; who presented to the hospital secondary to shortness of breath and weakness.  Patient reports symptom has been present for the last week or so and worsening.  Currently with difficulty laying down flat as she feels gasping for air and also noticing dyspnea on exertion.  Patient expressed increased swelling in her legs and some difficulty speaking in full sentences. Patient's husband at bedside at information of increased frequency and further deconditioning/generalized weakness.  No descriptions of focal deficits, sick contacts, fever, chest pain, hematuria, melena, hematochezia or any other complaints.  Workup in the ED demonstrating elevated BNP, vascular congestion on chest x-ray and positive urinalysis for UTI.  Physical examination revealed tachypnea, decreased breath sounds at the bases and expiratory wheezing.  Steroids, nebulizer treatment, IV Lasix  and antibiotics after cultures taken has been provided; TRH consulted for further evaluation and management.  Review of Systems: As mentioned in the history of present illness. All other systems reviewed and are negative. Past Medical History:  Diagnosis Date   High cholesterol    Hypertension    Hypothyroidism    Stroke (HCC)    weakness on left side.   Vaginal Pap smear, abnormal    Past Surgical History:  Procedure Laterality Date   DILATION AND CURETTAGE OF  UTERUS     LASER ABLATION CONDOLAMATA Bilateral 09/27/2017   Procedure: LASER ABLATION CONDYLOMA AND REMOVAL OF PERIANAL WART;  Surgeon: Wendelyn Halter, MD;  Location: [facility];  Service: Gynecology;  Laterality: Bilateral;   MASS EXCISION Left 09/27/2017   Procedure: EXCISION LESION LEFT THIGH;  Surgeon: Wendelyn Halter, MD;  Location: [facility];  Service: Gynecology;  Laterality: Left;   RIGHT HEART CATH N/A 07/14/2023   Procedure: RIGHT HEART CATH;  Surgeon: Alwin Baars, DO;  Location: [facility];  Service: Cardiovascular;  Laterality: N/A;   Social History:  reports that she has been smoking cigarettes. She has a 16 pack-year smoking history. She has never used smokeless tobacco. She reports current drug use. Drug: Marijuana. She reports that she does not drink alcohol.  Allergies  Allergen Reactions   Lisinopril  Nausea Only    Family History  Problem Relation Age of Onset   Hypertension Mother    Liver disease Mother    Heart attack Father    HIV Sister    Arrhythmia Sister    Mood Disorder Sister    Arrhythmia Brother    CAD Brother     Prior to Admission medications   Medication Sig Start Date End Date Taking? Authorizing Provider  acetaminophen  (TYLENOL ) 500 MG tablet Take 1,000 mg by mouth every 6 (six) hours as needed for mild pain.    [provider]  aspirin  EC 325 MG tablet Take 1 tablet (325 mg total) by mouth daily. 08/24/17   Dorena Gander, MD  furosemide  (LASIX ) 20 MG tablet Take 1 tablet (20 mg total) by mouth every Monday, Wednesday, and Friday. 08/07/23   Elmarie Hacking, FNP  HUMIRA , 2 PEN, 40 MG/0.4ML pen INJECT 40 MG INTO THE SKIN EVERY 14 DAYS 07/17/23   Rice, Haig Levan, MD  levothyroxine  (SYNTHROID ) 50 MCG tablet Take 50 mcg by mouth daily before breakfast.    [provider]  midodrine  (PROAMATINE ) 2.5 MG tablet Take 1 tablet (2.5 mg total) by mouth 3 (three) times daily with meals. 08/07/23   Elmarie Hacking, FNP  traZODone  (DESYREL) 100 MG tablet Take 100 mg by mouth at bedtime. 05/09/22   [provider]    Physical Exam: Vitals:   08/15/23 0615 08/15/23 0627 08/15/23 0700 08/15/23 0906  BP:   (!) 118/94   Pulse: (!) 104  (!) 112   Resp:   (!) 22   Temp:  97.9 F (36.6 C)    SpO2: 100%  100% 100%  Weight:       General exam: Alert, awake, oriented x 3; demonstrating underlying dysarthria and having difficulty speaking in full sentences or complaining of orthopnea. Respiratory system: Positive tachypnea, expiratory wheezing and decreased breath sounds appreciated on exam.  Good saturation on room air. Cardiovascular system:RRR.  No rubs, no gallops, no JVD. Gastrointestinal system: Abdomen is nondistended, soft and nontender.  Positive bowel sounds. Central nervous system: No new focal neurological deficits. Extremities: No cyanosis or clubbing.  Trace to 1+ edema appreciated bilaterally. Skin: No petechiae. Psychiatry: Judgement and insight appear normal. Mood & affect appropriate.   Data Reviewed: Respiratory panel: Negative for COVID, RSV and influenza Basic metabolic panel: Sodium 133, potassium 4.7, chloride 100, bicarb 26, BUN 39, creatinine 1.2 and GFR 51 CBC: WBCs 2.9, hemoglobin 13.8 and platelet count 122K Troponin: 117 >> 119  Assessment and Plan: Acute exacerbation of CHF (congestive heart failure) (HCC) -In the setting of significant right-sided heart failure -Elevated BNP and vascular congestion appreciated - Patient reports orthopnea, shortness of breath with activity and lower extremity edema. -Reports to be compliant with prescribed medication (of note, patient recently advise to adjust home lasix  dose and was taken off sildenafil ) -Recent 2D echo: (April 2025) ejection fraction 50 to 55%, no wall motion abnormalities; right ventricular systolic function moderate to severely reduced; moderately elevated pulmonary artery systolic pressure. - Daily weights, low-sodium diet and  strict intake and output has been order -will check REDs Clip measurements and start IV lasix  - Service consulted and will follow any further recommendations. -Given concerns of progression/worsening of her pulmonary HTN/right sided heart failure palliative care consulted for GOC and advance care planning.  Elevated troponin - In the setting of demand ischemia from CHF exacerbation most likely - Patient denies chest pain - Continue current management and telemetry monitoring - Will follow cardiology service recommendations for any other needed test or even to repeat echo.  Essential hypertension - With component of slow blood pressure/orthostatic hypotension - Continue midodrine  - Continue to follow vital signs especially while actively diuresing - Heart healthy/low-sodium diet has been ordered.  Mixed hyperlipidemia - Holding statin therapy in the setting of transaminitis. -Heart healthy diet has been ordered.  Acquired hypothyroidism - Update TSH - Continue Synthroid .  CVA (cerebral vascular accident) (HCC) - Prior history of stroke with left-sided hemiparesis and some dysarthria as residual effects. -Continue risk factor modifications - Continue full dose aspirin  on daily basis for secondary prevention.  Pulmonary hypertension, unspecified (HCC) - Patient actively followed by heart failure team - Recently with discontinuation of sildenafil  - Continue diuretics and midodrine  - Cardiology service has been consulted and will follow any further recommendations.  Transaminitis - Appears to be secondary to congestive hepatitis -Will minimize hepatotoxic agents - Follow LFTs trend - IV diuresis for CHF as mentioned. -will check hepatitis panel -patient chronic use of Humira  can also cause elevation in her LFT's  UTI (urinary tract infection) -patient reporting frequency and lower abd discomfort -WA with concerns for acute UTI -will follow culture results -continue empiric  therapy with rocephin  for now  Reactive airway disease -in a patient with hx of smoking -no prior diagnosis of COPD -positive exp wheezing on exam -will provide treatment with steroids and bronchodilator management - Outpatient PFTs recommended.  Tobacco abuse - Cessation counseling provided -Nicotine  patch has been ordered  Class II obesity -Body mass index is 39.87 kg/m. - Low-calorie diet, portion control and increase physical activity discussed with patient.   RA -continue outpatient follow up with rheumatology -as an outpatient using Humira .   Lactic acidosis -in the setting of RV dysfunction most likely -will follow cardiology rec's -will check COOX -placing PICC line   Advance Care Planning:   Code Status: Full Code   Consults: Cardiology service and palliative care.  Family Communication: Husband at bedside.  Severity of Illness: The appropriate patient status for this patient is INPATIENT. Inpatient status is judged to be reasonable and necessary in order to provide the required intensity of service to ensure the patient's safety. The patient's presenting symptoms, physical exam findings, and initial radiographic and laboratory data in the context of their chronic comorbidities is felt to place them at high risk for further clinical deterioration. Furthermore, it is not anticipated that the patient will be medically stable for discharge from the hospital within 2 midnights of admission.   * I certify that at the point of admission it is my clinical judgment that the patient will require inpatient hospital care spanning beyond 2 midnights from the point of admission due to high intensity of service, high risk for further deterioration and high frequency of surveillance required.*  Author: Justina Oman, MD 08/15/2023 9:22 AM  For on call review [email or web address].

## 2023-08-15 NOTE — Consult Note (Signed)
 Cardiology Consultation   Patient ID: [name] MRN: [id number]; DOB: [birth year]  Admit date: 08/14/2023 Date of Consult: 08/15/2023  PCP:  [facility]   [facility] Providers Cardiologist:  Elizabeth Berger, MD   {   Patient Profile:   [name] is a 58 y.o. female with a hx of  RA, hypothyroidism, prior stroke now wheelchair bound, chronic RV failure/pulmonary HTN who is being seen 08/15/2023 for the evaluation of SOB at the request of Dr [name].  History of Present Illness:   [name] 58 yo female history of RA, hypothyroidism, prior stroke now wheelchair bound, chronic RV failure/pulmonary HTN, presents with SOB and weakness. She reports medication compliance at home. Denies any LE edema.    BNP 1054 K 4.7 Cr 1.23 BUN 39 AST 936 ALT 346 Tbili 1.8 Mg 2.2 VBG 7.39 pCO2 50 bicarb 30 WBC 2.9 Hgb 13.8 Plt 122  Trop 117--> EKG sinus tach 107, nonspecific ST/T changes CXR: no acute process  RHC on 07/15/23 with RA 4, PCWP 13, mPA 44 and PVR of 6.4 with CI of 2.4 L/min/m2  VQ scan negative    Past Medical History:  Diagnosis Date   High cholesterol    Hypertension    Hypothyroidism    Stroke (HCC)    weakness on left side.   Vaginal Pap smear, abnormal     Past Surgical History:  Procedure Laterality Date   DILATION AND CURETTAGE OF UTERUS     LASER ABLATION CONDOLAMATA Bilateral 09/27/2017   Procedure: LASER ABLATION CONDYLOMA AND REMOVAL OF PERIANAL WART;  Surgeon: Elizabeth Halter, MD;  Location: [facility];  Service: Gynecology;  Laterality: Bilateral;   MASS EXCISION Left 09/27/2017   Procedure: EXCISION LESION LEFT THIGH;  Surgeon: Elizabeth Halter, MD;  Location: [facility];  Service: Gynecology;  Laterality: Left;   RIGHT HEART CATH N/A 07/14/2023   Procedure: RIGHT HEART CATH;  Surgeon: Elizabeth Baars, DO;  [facility]: [facility];  Service: Cardiovascular;  Laterality: N/A;     Inpatient Medications: Scheduled Meds:   aspirin  EC  325 mg Oral Daily   budesonide (PULMICORT) nebulizer solution  0.5 mg Nebulization BID   furosemide   40 mg Intravenous Q12H   heparin   5,000 Units Subcutaneous Q8H   levothyroxine   50 mcg Oral QAC breakfast   methylPREDNISolone  (SOLU-MEDROL ) injection  40 mg Intravenous Q12H   midodrine   2.5 mg Oral TID WC   pantoprazole  40 mg Oral Daily   sodium chloride  flush  3 mL Intravenous Q12H   Continuous Infusions:  sodium chloride      cefTRIAXone  (ROCEPHIN )  IV     PRN Meds: sodium chloride , acetaminophen  **OR** acetaminophen , HYDROmorphone  (DILAUDID ) injection, ipratropium-albuterol , prochlorperazine , sodium chloride  flush  Allergies:    Allergies  Allergen Reactions   Lisinopril  Nausea Only    Social History:   Social History   Socioeconomic History   Marital status: Married    Spouse name: Not on file   Number of children: Not on file   Years of education: Not on file   Highest education level: Not on file  Occupational History   Not on file  Tobacco Use   Smoking status: Every Day    Current packs/day: 0.50    Average packs/day: 0.5 packs/day for 32.0 years (16.0 ttl pk-yrs)    Types: Cigarettes   Smokeless tobacco: Never  Vaping Use   Vaping status: Never Used  Substance and Sexual Activity  Alcohol use: No   Drug use: Yes    Types: Marijuana    Comment: occ   Sexual activity: Not Currently    Birth control/protection: Post-menopausal  Other Topics Concern   Not on file  Social History Narrative   Grew up in [place], [place].   Engaged to be married.   5 children.    Eats mainly meat/junk food.   Social Drivers of Corporate investment banker Strain: Low Risk  (04/07/2023)   Received from [facility]   Overall Financial Resource Strain (CARDIA)    Difficulty of Paying Living Expenses: Not hard at all  Food Insecurity: No Food Insecurity (07/09/2023)   Hunger Vital Sign    Worried About Running Out of Food in the Last Year: Never true    Ran Out  of Food in the Last Year: Never true  Transportation Needs: No Transportation Needs (07/09/2023)   PRAPARE - Administrator, Civil Service (Medical): No    Lack of Transportation (Non-Medical): No  Physical Activity: Sufficiently Active (08/31/2021)   Exercise Vital Sign    Days of Exercise per Week: 3 days    Minutes of Exercise per Session: 60 min  Recent Concern: Physical Activity - Inactive (07/07/2021)   Received from [facility]   Exercise Vital Sign    Days of Exercise per Week: 0 days    Minutes of Exercise per Session: 0 min  Stress: No Stress Concern Present (08/31/2021)   [employer] of Occupational Health - Occupational Stress Questionnaire    Feeling of Stress : Not at all  Social Connections: Unknown (07/08/2022)   Received from [facility]   Social Network    Social Network: Not on file  Intimate Partner Violence: Not At Risk (07/09/2023)   Humiliation, Afraid, Rape, and Kick questionnaire    Fear of Current or Ex-Partner: No    Emotionally Abused: No    Physically Abused: No    Sexually Abused: No    Family History:    Family History  Problem Relation Age of Onset   Hypertension Mother    Liver disease Mother    Heart attack Father    HIV Sister    Arrhythmia Sister    Mood Disorder Sister    Arrhythmia Brother    CAD Brother      ROS:  Please see the history of present illness.   All other ROS reviewed and negative.     Physical Exam/Data:   Vitals:   08/15/23 0600 08/15/23 0615 08/15/23 0627 08/15/23 0700  BP: 110/89   (!) 118/94  Pulse: (!) 104 (!) 104  (!) 112  Resp: (!) 26   (!) 22  Temp:   97.9 F (36.6 C)   SpO2: 100% 100%  100%  Weight:        Intake/Output Summary (Last 24 hours) at 08/15/2023 0853 Last data filed at 08/15/2023 0622 Gross per 24 hour  Intake 100 ml  Output 400 ml  Net -300 ml      08/14/2023   11:04 PM 08/07/2023    2:15 PM 07/16/2023    5:36 AM  Last 3 Weights  Weight  (lbs) 247 lb -- 229 lb 15 oz  Weight (kg) 112.038 kg -- 104.3 kg     Body mass index is 39.87 kg/m.  General:  Well nourished, well developed, in no acute distress HEENT: normal Neck: + JVD Vascular: No carotid bruits; Distal pulses 2+ bilaterally  Cardiac:  normal S1, S2; RRR; no murmur  Lungs:  clear to auscultation bilaterally, no wheezing, rhonchi or rales  Abd: soft, nontender, no hepatomegaly  Ext: no edema Musculoskeletal:  No deformities, BUE and BLE strength normal and equal Skin: cool extremities Neuro:  CNs 2-12 intact, no focal abnormalities noted Psych:  Normal affect     Laboratory Data:  High Sensitivity Troponin:   Recent Labs  Lab 08/14/23 2341 08/15/23 0101  TROPONINIHS 117* 119*     Chemistry Recent Labs  Lab 08/15/23 0101  NA 133*  K 4.7  CL 100  CO2 26  GLUCOSE 81  BUN 39*  CREATININE 1.23*  CALCIUM  8.9  MG 2.2  GFRNONAA 51*  ANIONGAP 7    Recent Labs  Lab 08/15/23 0101  PROT 10.8*  ALBUMIN  2.3*  AST 936*  ALT 346*  ALKPHOS 112  BILITOT 1.8*   Lipids No results for input(s): "CHOL", "TRIG", "HDL", "LABVLDL", "LDLCALC", "CHOLHDL" in the last 168 hours.  Hematology Recent Labs  Lab 08/14/23 2341  WBC 2.9*  RBC 4.91  HGB 13.8  HCT 43.9  MCV 89.4  MCH 28.1  MCHC 31.4  RDW 18.4*  PLT 122*   Thyroid No results for input(s): "TSH", "FREET4" in the last 168 hours.  BNP Recent Labs  Lab 08/14/23 2341  BNP 1,054.0*    DDimer No results for input(s): "DDIMER" in the last 168 hours.   Radiology/Studies:  DG Chest Port 1 View Result Date: 08/15/2023 CLINICAL DATA:  Shortness of breath with loss of appetite and diarrhea. EXAM: PORTABLE CHEST 1 VIEW COMPARISON:  July 14, 2023 FINDINGS: The cardiac silhouette is mildly enlarged and unchanged in size. There is no evidence of acute infiltrate, pleural effusion or pneumothorax. The visualized skeletal structures are unremarkable. IMPRESSION: No active cardiopulmonary disease.  Electronically Signed   By: Virgle Grime M.D.   On: 08/15/2023 00:30     Assessment and Plan:     1.Acute on chronic RV failure/Pulmonary HTN - 07/2023 echo: LVEF 50-55%, mod to severe RV dysfunction, mod RVE, mod pulm HTN -  RHC on 07/15/23 with RA 4, PCWP 13, mPA 44 and PVR of 6.4 with CI of 2.4 L/min/m2  - VQ negative, CT without specific lung pathology - outpatient PFTs pending, does have long tobacco history - unclear etiology thus far of her precapillary pulmonary HTN  - was on sildenafil , stopped at 08/07/23 HF clinic visit. Started on midodrine  for low bp's - at 08/07/23 appt appeared dry, lasix  lowered to 20mg  MWF - from HF notes limited treatment options given her poor baseline functional status.   - BNP 1054, sinus tach, elevated LFTs. Lactic acid is pending. Recent RHC borderline CI of 2.4 Concern would be low output right sided HF.  -redose IV lasix  60mg  x 1 this morning. F/u lactic acid, if low would plan for PICC line and considerations for [place]ronine and [place] transfer for HF team evaluation       For questions or updates, please contact [facility] Please consult [email or web address] for contact info under    Signed, Armida Lander, MD  08/15/2023 8:53 AM

## 2023-08-15 NOTE — TOC Initial Note (Signed)
 Transition of Care [facility]) - Initial/Assessment Note    Patient Details  Name: [name] MRN: [id number] Date of Birth: 08/06/1965  Transition [facility] Care [facility]) CM/SW Contact:    Grandville Lax, LCSWA Phone Number: 08/15/2023, 1:47 PM  Clinical Narrative:                 Pt is high risk for readmission. Pt with recent hospital admission. Pt lives with her spouse and granddaughter. Pt has not had HH in the past. Pt was recently provided with a 3N1 at last hospital admission. TOC to follow.   Expected Discharge Plan: Home/Self Care Barriers to Discharge: Continued Medical Work up   Patient Goals and CMS Choice Patient states their goals for this hospitalization and ongoing recovery are:: return home CMS Medicare.gov Compare Post Acute Care list provided to:: Patient Choice offered to / list presented to : Patient      Expected Discharge Plan and Services In-house Referral: Clinical Social Work Discharge Planning Services: CM Consult   Living arrangements for the past 2 months: Single Family Home                                      Prior Living Arrangements/Services Living arrangements for the past 2 months: Single Family Home Lives with:: Spouse, Minor Children Patient language and need for interpreter reviewed:: Yes Do you feel safe going back to the place where you live?: Yes      Need for Family Participation in Patient Care: Yes (Comment) Care giver support system in place?: Yes (comment) Current home services: DME Criminal Activity/Legal Involvement Pertinent to Current Situation/Hospitalization: No - Comment as needed  Activities of Daily Living   ADL Screening (condition at time of admission) Independently performs ADLs?: No Does the patient have a NEW difficulty with bathing/dressing/toileting/self-feeding that is expected to last >3 days?: Yes (Initiates electronic notice to provider for possible OT consult) Does the patient have a NEW difficulty with  getting in/out of bed, walking, or climbing stairs that is expected to last >3 days?: Yes (Initiates electronic notice to provider for possible PT consult) Does the patient have a NEW difficulty with communication that is expected to last >3 days?: No Is the patient deaf or have difficulty hearing?: No Does the patient have difficulty seeing, even when wearing glasses/contacts?: No Does the patient have difficulty concentrating, remembering, or making decisions?: No  Permission Sought/Granted                  Emotional Assessment Appearance:: Appears stated age Attitude/Demeanor/Rapport: Engaged Affect (typically observed): Accepting Orientation: : Oriented to Self, Oriented to Place, Oriented to  Time, Oriented to Situation Alcohol / Substance Use: Not Applicable Psych Involvement: No (comment)  Admission diagnosis:  Acute exacerbation of CHF (congestive heart failure) (HCC) [I50.9] Cystitis [N30.90] Acute on chronic congestive heart failure, unspecified heart failure type [facility]) [I50.9] Patient Active Problem List   Diagnosis Date Noted   Acute exacerbation of CHF (congestive heart failure) (HCC) 08/15/2023   UTI (urinary tract infection) 08/15/2023   Reactive airway disease 08/15/2023   Pulmonary hypertension, unspecified (HCC) 07/14/2023   Shortness of breath 07/11/2023   Pressure injury of skin 07/11/2023   Elevated brain natriuretic peptide (BNP) level 07/10/2023   Elevated d-dimer 07/10/2023   Elevated troponin 07/10/2023   Leukopenia 07/10/2023   Prolonged QT interval 07/10/2023   Hypoalbuminemia due to protein-calorie malnutrition (HCC) 07/10/2023  Transaminitis 07/10/2023   Class II obesity 07/10/2023   Acute CHF (congestive heart failure) (HCC) 07/09/2023   Acute respiratory failure with hypoxia (HCC) 01/15/2022   Hypokalemia 01/15/2022   Hyponatremia 01/15/2022   CAP (community acquired pneumonia) 01/14/2022   Sepsis (HCC) 01/14/2022   Pain in right knee  12/14/2021   Mild intermittent asthma without complication 07/07/2021   Normocytic anemia 12/02/2020   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/19/2020   High risk medication use 06/19/2020   Vitamin D  deficiency 06/19/2020   Arthritis involving multiple sites 02/18/2018   Mixed hyperlipidemia 02/16/2018   Class 3 severe obesity due to excess calories with serious comorbidity and body mass index (BMI) of 45.0 to 49.9 in adult 02/16/2018   Hemiparesis and alteration of sensations as late effects of stroke (HCC) 02/16/2018   Anogenital (venereal) warts 08/04/2017   Screening for colorectal cancer 08/04/2017   Encounter for gynecological examination with Papanicolaou smear of cervix 08/04/2017   Vertigo 08/04/2017   Vertigo as late effect of stroke 08/04/2017   Tobacco abuse 06/01/2017   Acquired hypothyroidism 05/12/2017   CVA (cerebral vascular accident) (HCC) 06/25/2013   Tremor of left hand 06/25/2013   Hemiparesis, left (HCC) 06/25/2013   History of CVA with residual deficit 06/25/2013   Difficulty walking 08/31/2011   Unstable balance 08/31/2011   Weakness of left side of body 08/31/2011   Difficulty walking 08/31/2011   Cerebral infarction (HCC) 04/07/2011   Left hand weakness 04/07/2011   Essential hypertension 04/07/2011   PCP:  [facility]:   [facility] [address] [place] [address] [place] [address] [place] [address] [place] [address] Phone: [phone] Fax: [phone]  [facility] [address] [place] [address] Phone: [phone] Fax: [phone]     Social Drivers of Health (SDOH) Social History: SDOH Screenings   Food Insecurity: No Food Insecurity (08/15/2023)  Housing: Low Risk  (08/15/2023)  Transportation Needs: No Transportation Needs (08/15/2023)  Utilities: Not At Risk (08/15/2023)  Alcohol Screen: Low Risk   (08/31/2021)  Depression (PHQ2-9): Low Risk  (08/31/2021)  Financial Resource Strain: Low Risk  (04/07/2023)   [facility] from [facility]  Physical Activity: Sufficiently Active (08/31/2021)  Recent Concern: Physical Activity - Inactive (07/07/2021)   Received from [facility], [facility]  Social Connections: Unknown (07/08/2022)   Received from [facility]  Stress: No Stress Concern Present (08/31/2021)  Tobacco Use: High Risk (08/15/2023)   SDOH Interventions:     Readmission Risk Interventions    08/15/2023    1:45 PM  Readmission Risk Prevention Plan  Transportation Screening Complete  Home Care Screening Complete  Medication Review (RN CM) Complete

## 2023-08-15 NOTE — Assessment & Plan Note (Signed)
-  In the setting of significant right-sided heart failure -Elevated BNP and vascular congestion appreciated - Patient reports orthopnea, shortness of breath with activity and lower extremity edema. -Reports to be compliant with prescribed medication (of note, patient recently advise to adjust home lasix  dose and was taken off sildenafil ) - Daily weights, low-sodium diet and strict intake and output has been order -will check REDs Clip measurements and start IV lasix  - Service consulted and will follow any further recommendations. -Given concerns of progression/worsening of her pulmonary HTN/right sided heart failure palliative care consulted for GOC and advance care planning.

## 2023-08-15 NOTE — Assessment & Plan Note (Signed)
-  Update TSH -Continue Synthroid. 

## 2023-08-15 NOTE — ED Notes (Signed)
 Pt placed in hospital bed at this time. Daughter [name] updated on phone per pt request.

## 2023-08-15 NOTE — Progress Notes (Signed)
 Writer spoke with patient spouse, grand daughter in the patient room about PICC being placed and they both expressed full understanding and consented to picc being placed. They stated to please call [name], patient daughter to get full consent for PICC. Call placed and discussed with [name] about PICC and [name] verbally consented over the phone for ok for PICC to be placed. All questions answered and consent form filled out with two RN confirmation/witness. Consent form placed with patient chart for review by IV nurse team.

## 2023-08-15 NOTE — Assessment & Plan Note (Signed)
-   Patient actively followed by heart failure team - Recently with discontinuation of sildenafil  - Continue diuretics and midodrine  - Cardiology service has been consulted and will follow any further recommendations.

## 2023-08-15 NOTE — Plan of Care (Signed)

## 2023-08-15 NOTE — Progress Notes (Signed)
 Heart Failure Navigator Progress Note  [facility] patient Assessed for Heart & Vascular TOC clinic readiness.  Patient does not meet criteria due to current Advanced Heart Failure Team patient of Aditya, Sabharwal, DO.  Per 08/15/2023 note if f/u lactic acid is low they would plan for PICC line and considerations for milrinone and Arlin Benes transfer for HF Team evaluation.  Patient does have a current future follow-up at [facility] in the future on 09/18/2023.   Navigator available for reassessment of patient or if an appointment would need to be scheduled earlier for hospital follow-up.  Celedonio Coil, RN, BSN [facility] Heart Failure Navigator Secure Chat Only

## 2023-08-15 NOTE — Assessment & Plan Note (Signed)
-  in a patient with hx of smoking -no prior diagnosis of COPD -positive exp wheezing on exam -will provide treatment with steroids and bronchodilator management - Outpatient PFTs recommended.

## 2023-08-15 NOTE — ED Notes (Signed)
 RN and this tech cleaned  and changed pt brief, along with bed change. Pt is now resting with family at bedside.

## 2023-08-15 NOTE — Assessment & Plan Note (Signed)
-   With component of slow blood pressure/orthostatic hypotension - Continue midodrine  - Continue to follow vital signs especially while actively diuresing - Heart healthy/low-sodium diet has been ordered.

## 2023-08-15 NOTE — Consult Note (Signed)
 Consultation Note Date: 08/15/2023   Patient Name: Sami Delrosario  DOB: 07-Mar-1966  MRN: [id number]  Age / Sex: 58 y.o., female  PCP: [facility] Referring Physician: Justina Oman, MD  Reason for Consultation: Establishing goals of care  HPI/Patient Profile: 58 y.o. female  with past medical history of HTN, HLD, CVA, arthritis, asthma, CHF, pulmonary hypertension admitted on 08/14/2023 with acute exacerbation of heart failure, elevated troponin secondary to demand ischemia.   Clinical Assessment and Goals of Care: I have reviewed medical records including EPIC notes, labs and imaging, received report from RN, assessed the patient.  Mrs. Fellers is lying quietly in bed in the ED.  She appears acutely/chronically ill and very frail.  She greets me, making and somewhat keeping eye contact.  She is alert and oriented, able to make her needs known.  Her husband, Mckinzie Pricer, is present at bedside.  We meet at the bedside to discuss diagnosis prognosis, GOC, EOL wishes, disposition and options.  I introduced Palliative Medicine as specialized medical care for people living with serious illness. It focuses on providing relief from the symptoms and stress of a serious illness. The goal is to improve quality of life for both the patient and the family.  We discussed a brief life review of the patient.  Mrs. Owsley tells me that she and Royston Cornea married in 2019.  She had stroke in 2013.  She has help from her family for ADLs.  We then focused on their current illness. We talk about her acute on chronic heart failure and the treatment plan.  We talk about time for outcomes. The natural disease trajectory and expectations at EOL were discussed.  Advanced directives, concepts specific to code status, artifical feeding and hydration, and rehospitalization were considered and discussed.  We talked  about the concept of "treat the treatable, but allow a natural passing".  Mrs. Henrichs states that she has not considered her CODE STATUS.  I encouraged her and her husband to consider what they do and do not want.  I also encouraged her to share her wishes with her Mathilda Solum, her daughter [name].  Discussed the importance of continued conversation with family and the medical providers regarding overall plan of care and treatment options, ensuring decisions are within the context of the patient's values and GOCs.  Questions and concerns were addressed.  Hard Choices booklet left for review. The family was encouraged to call with questions or concerns.  PMT will continue to support holistically.  Conference with attending, bedside nursing staff, transition of care team related to patient condition, needs, goals of care, disposition.   HCPOA  NEXT OF KIN -Mrs. Moxley states that her daughter, Arno Lapidus, would be her healthcare surrogate.  Although she is known her husband Royston Cornea for many years, they have only been married since 2019.  Royston Cornea defers to daughter [name].    SUMMARY OF RECOMMENDATIONS   Continue full scope/full code Time for outcomes Need for short-term rehab would not be surprising Would benefit  from outpatient palliative   Code Status/Advance Care Planning: Full code - We talked about the concept of "treat the treatable, but allow a natural passing".  Mrs. Kantner states that she has not considered her CODE STATUS.  I encouraged her and her husband to consider what they do and do not want.  I also encouraged her to share her wishes with her Mathilda Solum, her daughter [name].  Symptom Management:  Per hospitalist, no additional needs at this time.  Palliative Prophylaxis:  Frequent Pain Assessment, Oral Care, and Turn Reposition  Additional Recommendations (Limitations, Scope, Preferences): Full Scope Treatment  Psycho-social/Spiritual:  Desire for further Chaplaincy  support:no Additional Recommendations: Caregiving  Support/Resources  Prognosis:  Unable to determine, guarded at this point.  6 months or less would not be surprising based on stroke since 2013, poor functional status, increasing chronic illness burden.  Discharge Planning: To Be Determined      Primary Diagnoses: Present on Admission:  Essential hypertension  Mixed hyperlipidemia  Acquired hypothyroidism  CVA (cerebral vascular accident) (HCC)  Pulmonary hypertension, unspecified (HCC)  Transaminitis  UTI (urinary tract infection)  Tobacco abuse  Class II obesity   I have reviewed the medical record, interviewed the patient and family, and examined the patient. The following aspects are pertinent.  Past Medical History:  Diagnosis Date   High cholesterol    Hypertension    Hypothyroidism    Stroke (HCC)    weakness on left side.   Vaginal Pap smear, abnormal    Social History   Socioeconomic History   Marital status: Married    Spouse name: Not on file   Number of children: Not on file   Years of education: Not on file   Highest education level: Not on file  Occupational History   Not on file  Tobacco Use   Smoking status: Every Day    Current packs/day: 0.50    Average packs/day: 0.5 packs/day for 32.0 years (16.0 ttl pk-yrs)    Types: Cigarettes   Smokeless tobacco: Never  Vaping Use   Vaping status: Never Used  Substance and Sexual Activity   Alcohol use: No   Drug use: Yes    Types: Marijuana    Comment: occ   Sexual activity: Not Currently    Birth control/protection: Post-menopausal  Other Topics Concern   Not on file  Social History Narrative   Grew up in [place], [place].   Engaged to be married.   5 children.    Eats mainly meat/junk food.   Social Drivers of Corporate investment banker Strain: Low Risk  (04/07/2023)   Received from [facility]   Overall Financial Resource Strain (CARDIA)    Difficulty of Paying Living Expenses: Not  hard at all  Food Insecurity: No Food Insecurity (07/09/2023)   Hunger Vital Sign    Worried About Running Out of Food in the Last Year: Never true    Ran Out of Food in the Last Year: Never true  Transportation Needs: No Transportation Needs (07/09/2023)   PRAPARE - Administrator, Civil Service (Medical): No    Lack of Transportation (Non-Medical): No  Physical Activity: Sufficiently Active (08/31/2021)   Exercise Vital Sign    Days of Exercise per Week: 3 days    Minutes of Exercise per Session: 60 min  Recent Concern: Physical Activity - Inactive (07/07/2021)   Received from [facility]   Exercise Vital Sign    Days of Exercise per Week:  0 days    Minutes of Exercise per Session: 0 min  Stress: No Stress Concern Present (08/31/2021)   [employer] of Occupational Health - Occupational Stress Questionnaire    Feeling of Stress : Not at all  Social Connections: Unknown (07/08/2022)   Received from [facility], [facility]   Social Network    Social Network: Not on file   Family History  Problem Relation Age of Onset   Hypertension Mother    Liver disease Mother    Heart attack Father    HIV Sister    Arrhythmia Sister    Mood Disorder Sister    Arrhythmia Brother    CAD Brother    Scheduled Meds:  aspirin  EC  325 mg Oral Daily   budesonide (PULMICORT) nebulizer solution  0.5 mg Nebulization BID   furosemide   60 mg Intravenous Once   heparin   5,000 Units Subcutaneous Q8H   levothyroxine   50 mcg Oral QAC breakfast   methylPREDNISolone  (SOLU-MEDROL ) injection  40 mg Intravenous Q12H   midodrine   2.5 mg Oral TID WC   pantoprazole  40 mg Oral Daily   sodium chloride  flush  3 mL Intravenous Q12H   Continuous Infusions:  sodium chloride      cefTRIAXone  (ROCEPHIN )  IV     PRN Meds:.sodium chloride , acetaminophen  **OR** acetaminophen , HYDROmorphone  (DILAUDID ) injection, ipratropium-albuterol , prochlorperazine , sodium chloride  flush Medications  Prior to Admission:  Prior to Admission medications   Medication Sig Start Date End Date Taking? Authorizing Provider  acetaminophen  (TYLENOL ) 500 MG tablet Take 1,000 mg by mouth every 6 (six) hours as needed for mild pain.    [provider]  aspirin  EC 325 MG tablet Take 1 tablet (325 mg total) by mouth daily. 08/24/17   Dorena Gander, MD  furosemide  (LASIX ) 20 MG tablet Take 1 tablet (20 mg total) by mouth every Monday, Wednesday, and Friday. 08/07/23   Milford, Arlice Bene, FNP  HUMIRA , 2 PEN, 40 MG/0.4ML pen INJECT 40 MG INTO THE SKIN EVERY 14 DAYS 07/17/23   Rice, Haig Levan, MD  levothyroxine  (SYNTHROID ) 50 MCG tablet Take 50 mcg by mouth daily before breakfast.    [provider]  midodrine  (PROAMATINE ) 2.5 MG tablet Take 1 tablet (2.5 mg total) by mouth 3 (three) times daily with meals. 08/07/23   Elmarie Hacking, FNP  traZODone (DESYREL) 100 MG tablet Take 100 mg by mouth at bedtime. 05/09/22   [provider]   Allergies  Allergen Reactions   Lisinopril  Nausea Only   Review of Systems  Unable to perform ROS: Acuity of condition    Physical Exam Vitals and nursing note reviewed.  Constitutional:      General: She is not in acute distress.    Appearance: She is obese. She is ill-appearing.  Cardiovascular:     Rate and Rhythm: Normal rate.  Pulmonary:     Effort: Pulmonary effort is normal. No tachypnea.  Skin:    General: Skin is warm and dry.  Neurological:     Mental Status: She is alert.  Psychiatric:        Mood and Affect: Mood normal.        Behavior: Behavior normal.     Vital Signs: BP 113/77   Pulse (!) 112   Temp 97.9 F (36.6 C)   Resp (!) 26   Wt 112 kg   LMP 07/29/2012   SpO2 100%   BMI 39.87 kg/m  Pain Scale: 0-10   Pain Score: 0-No pain  SpO2: SpO2: 100 % O2 Device:SpO2: 100 % O2 Flow Rate: .   IO: Intake/output summary:  Intake/Output Summary (Last 24 hours) at 08/15/2023 0944 Last data filed at 08/15/2023  0622 Gross per 24 hour  Intake 100 ml  Output 400 ml  Net -300 ml    LBM:   Baseline Weight: Weight: 112 kg Most recent weight: Weight: 112 kg     Palliative Assessment/Data:     Time In: 0950   Time Out: 1030 Time Total: 40 minutes  Greater than 50%  of this time was spent counseling and coordinating care related to the above assessment and plan.  Signed by: Annabelle Barrack, NP   Please contact Palliative Medicine Team phone at [phone] for questions and concerns.  For individual provider: See Tilford Foley

## 2023-08-15 NOTE — Assessment & Plan Note (Signed)
-   Appears to be secondary to congestive hepatitis -Will minimize hepatotoxic agents - Follow LFTs trend - IV diuresis for CHF as mentioned. -will check hepatitis panel -patient chronic use of Humira  can also cause elevation in her LFT's

## 2023-08-15 NOTE — Assessment & Plan Note (Addendum)
-   Holding statin therapy in the setting of transaminitis. -Heart healthy diet has been ordered.

## 2023-08-15 NOTE — Assessment & Plan Note (Signed)
-   Prior history of stroke with left-sided hemiparesis and some dysarthria as residual effects. -Continue risk factor modifications - Continue full dose aspirin  on daily basis for secondary prevention.

## 2023-08-15 NOTE — Assessment & Plan Note (Signed)
-  Cessation counseling provided -Nicotine patch has been ordered. 

## 2023-08-16 ENCOUNTER — Inpatient Hospital Stay (HOSPITAL_COMMUNITY)

## 2023-08-16 DIAGNOSIS — G9341 Metabolic encephalopathy: Secondary | ICD-10-CM | POA: Diagnosis not present

## 2023-08-16 DIAGNOSIS — R570 Cardiogenic shock: Secondary | ICD-10-CM

## 2023-08-16 DIAGNOSIS — I50813 Acute on chronic right heart failure: Secondary | ICD-10-CM

## 2023-08-16 DIAGNOSIS — I5033 Acute on chronic diastolic (congestive) heart failure: Secondary | ICD-10-CM | POA: Diagnosis not present

## 2023-08-16 DIAGNOSIS — Z515 Encounter for palliative care: Secondary | ICD-10-CM | POA: Diagnosis not present

## 2023-08-16 DIAGNOSIS — Z7189 Other specified counseling: Secondary | ICD-10-CM | POA: Diagnosis not present

## 2023-08-16 DIAGNOSIS — Z720 Tobacco use: Secondary | ICD-10-CM | POA: Diagnosis not present

## 2023-08-16 LAB — COMPREHENSIVE METABOLIC PANEL WITH GFR
ALT: 479 U/L — ABNORMAL HIGH (ref 0–44)
AST: 1311 U/L — ABNORMAL HIGH (ref 15–41)
Albumin: 2.4 g/dL — ABNORMAL LOW (ref 3.5–5.0)
Alkaline Phosphatase: 107 U/L (ref 38–126)
Anion gap: 8 (ref 5–15)
BUN: 53 mg/dL — ABNORMAL HIGH (ref 6–20)
CO2: 23 mmol/L (ref 22–32)
Calcium: 8.6 mg/dL — ABNORMAL LOW (ref 8.9–10.3)
Chloride: 99 mmol/L (ref 98–111)
Creatinine, Ser: 1.93 mg/dL — ABNORMAL HIGH (ref 0.44–1.00)
GFR, Estimated: 30 mL/min — ABNORMAL LOW (ref >60)
Glucose, Bld: 128 mg/dL — ABNORMAL HIGH (ref 70–99)
Potassium: 6.1 mmol/L — ABNORMAL HIGH (ref 3.5–5.1)
Sodium: 130 mmol/L — ABNORMAL LOW (ref 135–145)
Total Bilirubin: 2.4 mg/dL — ABNORMAL HIGH (ref 0.0–1.2)
Total Protein: >12 g/dL — ABNORMAL HIGH (ref 6.5–8.1)

## 2023-08-16 LAB — PROCALCITONIN: Procalcitonin: 0.41 ng/mL

## 2023-08-16 LAB — CBC
HCT: 45.1 % (ref 36.0–46.0)
Hemoglobin: 14.2 g/dL (ref 12.0–15.0)
MCH: 28.2 pg (ref 26.0–34.0)
MCHC: 31.5 g/dL (ref 30.0–36.0)
MCV: 89.7 fL (ref 80.0–100.0)
Platelets: 81 10*3/uL — ABNORMAL LOW (ref 150–400)
RBC: 5.03 MIL/uL (ref 3.87–5.11)
RDW: 18.6 % — ABNORMAL HIGH (ref 11.5–15.5)
WBC: 3.6 10*3/uL — ABNORMAL LOW (ref 4.0–10.5)
nRBC: 5.3 % — ABNORMAL HIGH (ref 0.0–0.2)

## 2023-08-16 LAB — COOXEMETRY PANEL
Carboxyhemoglobin: 0.9 % (ref 0.5–1.5)
Carboxyhemoglobin: 1.2 % (ref 0.5–1.5)
Methemoglobin: <0.7 % (ref 0.0–1.5)
Methemoglobin: <0.7 % (ref 0.0–1.5)
O2 Saturation: 46.9 %
O2 Saturation: 51.3 %
Total hemoglobin: 12.8 g/dL (ref 12.0–16.0)
Total hemoglobin: 12.4 g/dL (ref 12.0–16.0)

## 2023-08-16 LAB — HEPATITIS PANEL, ACUTE
HCV Ab: NONREACTIVE
Hep A IgM: NONREACTIVE
Hep B C IgM: NONREACTIVE
Hepatitis B Surface Ag: NONREACTIVE

## 2023-08-16 LAB — GLUCOSE, CAPILLARY
Glucose-Capillary: 114 mg/dL — ABNORMAL HIGH (ref 70–99)
Glucose-Capillary: 130 mg/dL — ABNORMAL HIGH (ref 70–99)
Glucose-Capillary: 120 mg/dL — ABNORMAL HIGH (ref 70–99)
Glucose-Capillary: 119 mg/dL — ABNORMAL HIGH (ref 70–99)

## 2023-08-16 LAB — LACTIC ACID, PLASMA: Lactic Acid, Venous: 4.7 mmol/L (ref 0.5–1.9)

## 2023-08-16 LAB — BLOOD GAS, VENOUS
Acid-base deficit: 4.1 mmol/L — ABNORMAL HIGH (ref 0.0–2.0)
Bicarbonate: 24.6 mmol/L (ref 20.0–28.0)
Drawn by: 53666
FIO2: 24 %
O2 Saturation: 46 %
Patient temperature: 36.6
pCO2, Ven: 59 mmHg (ref 44–60)
pH, Ven: 7.23 — ABNORMAL LOW (ref 7.25–7.43)
pO2, Ven: 36 mmHg (ref 32–45)

## 2023-08-16 LAB — MRSA NEXT GEN BY PCR, NASAL: MRSA by PCR Next Gen: NOT DETECTED

## 2023-08-16 LAB — PROTIME-INR
INR: 2.3 — ABNORMAL HIGH (ref 0.8–1.2)
Prothrombin Time: 25.6 s — ABNORMAL HIGH (ref 11.4–15.2)

## 2023-08-16 LAB — FOLATE: Folate: 22.2 ng/mL (ref >5.9)

## 2023-08-16 LAB — POTASSIUM: Potassium: 4.7 mmol/L (ref 3.5–5.1)

## 2023-08-16 LAB — AMMONIA: Ammonia: 50 umol/L — ABNORMAL HIGH (ref 9–35)

## 2023-08-16 MED ORDER — AMIODARONE HCL IN DEXTROSE 360-4.14 MG/200ML-% IV SOLN
30.0000 mg/h | INTRAVENOUS | Status: DC
  Administered 2023-08-17 – 2023-08-18 (×4): 30 mg/h via INTRAVENOUS
  Filled 2023-08-16 (×4): qty 200

## 2023-08-16 MED ORDER — DEXTROSE 50 % IV SOLN
1.0000 | Freq: Once | INTRAVENOUS | Status: AC
  Administered 2023-08-16: 50 mL via INTRAVENOUS

## 2023-08-16 MED ORDER — MILRINONE LACTATE IN DEXTROSE 20-5 MG/100ML-% IV SOLN
0.2500 ug/kg/min | INTRAVENOUS | Status: DC
  Administered 2023-08-16: 0.125 ug/kg/min via INTRAVENOUS
  Administered 2023-08-17 – 2023-08-25 (×15): 0.25 ug/kg/min via INTRAVENOUS
  Filled 2023-08-16 (×14): qty 100

## 2023-08-16 MED ORDER — MIDODRINE HCL 5 MG PO TABS
2.5000 mg | ORAL_TABLET | Freq: Once | ORAL | Status: AC
  Administered 2023-08-16: 2.5 mg via ORAL
  Filled 2023-08-16: qty 1

## 2023-08-16 MED ORDER — SODIUM CHLORIDE 0.9 % IV SOLN
1.0000 g | Freq: Two times a day (BID) | INTRAVENOUS | Status: DC
  Administered 2023-08-16 – 2023-08-17 (×2): 1 g via INTRAVENOUS
  Filled 2023-08-16 (×2): qty 20

## 2023-08-16 MED ORDER — SODIUM POLYSTYRENE SULFONATE 15 GM/60ML CO SUSP
30.0000 g | Freq: Once | Status: AC
  Administered 2023-08-16: 30 g via RECTAL
  Filled 2023-08-16: qty 120

## 2023-08-16 MED ORDER — PANTOPRAZOLE SODIUM 40 MG IV SOLR
40.0000 mg | INTRAVENOUS | Status: DC
  Administered 2023-08-16 – 2023-08-19 (×4): 40 mg via INTRAVENOUS
  Filled 2023-08-16 (×4): qty 10

## 2023-08-16 MED ORDER — AMIODARONE LOAD VIA INFUSION
150.0000 mg | Freq: Once | INTRAVENOUS | Status: AC
  Administered 2023-08-16: 150 mg via INTRAVENOUS
  Filled 2023-08-16: qty 83.34

## 2023-08-16 MED ORDER — SODIUM BICARBONATE 8.4 % IV SOLN
50.0000 meq | Freq: Once | INTRAVENOUS | Status: AC
  Administered 2023-08-16: 50 meq via INTRAVENOUS

## 2023-08-16 MED ORDER — ASPIRIN 300 MG RE SUPP
300.0000 mg | Freq: Every day | RECTAL | Status: DC
  Administered 2023-08-16 – 2023-08-17 (×2): 300 mg via RECTAL
  Filled 2023-08-16 (×3): qty 1

## 2023-08-16 MED ORDER — SODIUM ZIRCONIUM CYCLOSILICATE 10 G PO PACK
10.0000 g | PACK | Freq: Once | ORAL | Status: AC
  Administered 2023-08-16: 10 g via ORAL
  Filled 2023-08-16: qty 1

## 2023-08-16 MED ORDER — INSULIN ASPART 100 UNIT/ML IV SOLN
10.0000 [IU] | Freq: Once | INTRAVENOUS | Status: AC
  Administered 2023-08-16: 10 [IU] via INTRAVENOUS

## 2023-08-16 MED ORDER — SODIUM CHLORIDE 0.9% FLUSH: 10.0000 mL | INTRAVENOUS | Status: DC | PRN

## 2023-08-16 MED ORDER — FUROSEMIDE 10 MG/ML IJ SOLN
60.0000 mg | Freq: Once | INTRAMUSCULAR | Status: AC
  Administered 2023-08-16: 60 mg via INTRAVENOUS
  Filled 2023-08-16: qty 6

## 2023-08-16 MED ORDER — AMIODARONE HCL IN DEXTROSE 360-4.14 MG/200ML-% IV SOLN
60.0000 mg/h | INTRAVENOUS | Status: AC
  Administered 2023-08-16 (×2): 60 mg/h via INTRAVENOUS
  Filled 2023-08-16 (×3): qty 200

## 2023-08-16 MED ORDER — CALCIUM GLUCONATE-NACL 1-0.675 GM/50ML-% IV SOLN
1.0000 g | Freq: Once | INTRAVENOUS | Status: AC
  Administered 2023-08-16: 1000 mg via INTRAVENOUS
  Filled 2023-08-16: qty 50

## 2023-08-16 MED ORDER — DEXTROSE 50 % IV SOLN
INTRAVENOUS | Status: AC
Start: 2023-08-16 — End: 2023-08-16
  Filled 2023-08-16: qty 50

## 2023-08-16 MED ORDER — SODIUM CHLORIDE 0.9% FLUSH
10.0000 mL | Freq: Two times a day (BID) | INTRAVENOUS | Status: DC
  Administered 2023-08-16 – 2023-08-17 (×4): 10 mL
  Administered 2023-08-18: 40 mL
  Administered 2023-08-18: 20 mL
  Administered 2023-08-19: 10 mL
  Administered 2023-08-19: 40 mL
  Administered 2023-08-20 – 2023-08-25 (×9): 10 mL

## 2023-08-16 NOTE — Progress Notes (Addendum)
 Rounding Note    Patient Name: [name] Date of Encounter: 08/16/2023   [facility] Cardiologist: Ola Berger, MD   Subjective   Increased somnolence this morning.   Inpatient Medications    Scheduled Meds:  aspirin  EC  325 mg Oral Daily   budesonide (PULMICORT) nebulizer solution  0.5 mg Nebulization BID   Chlorhexidine  Gluconate Cloth  6 each Topical Q0600   levothyroxine   50 mcg Oral QAC breakfast   methylPREDNISolone  (SOLU-MEDROL ) injection  40 mg Intravenous Q12H   midodrine   2.5 mg Oral TID WC   pantoprazole  40 mg Oral Daily   sodium chloride  flush  10-40 mL Intracatheter Q12H   sodium chloride  flush  3 mL Intravenous Q12H   Continuous Infusions:  sodium chloride      cefTRIAXone  (ROCEPHIN )  IV Stopped (08/15/23 2141)   PRN Meds: sodium chloride , acetaminophen  **OR** acetaminophen , ipratropium-albuterol , prochlorperazine , sodium chloride  flush, sodium chloride  flush   Vital Signs    Vitals:   08/16/23 0200 08/16/23 0300 08/16/23 0400 08/16/23 0500  BP: 106/82 (!) 94/47  (!) 111/93  Pulse:      Resp: (!) 23 (!) 23 (!) 22 (!) 24  Temp:    100.1 F (37.8 C)  TempSrc:    Rectal  SpO2: 97%   100%  Weight:      Height:        Intake/Output Summary (Last 24 hours) at 08/16/2023 0818 Last data filed at 08/16/2023 0341 Gross per 24 hour  Intake 103 ml  Output 500 ml  Net -397 ml      08/15/2023   10:25 AM 08/14/2023   11:04 PM 08/07/2023    2:15 PM  Last 3 Weights  Weight (lbs) 210 lb 15.7 oz 247 lb --  Weight (kg) 95.7 kg 112.038 kg --      Telemetry    Sinus tach - Personally Reviewed  ECG    N/a - Personally Reviewed  Physical Exam   GEN: No acute distress.   Neck: + JVD Cardiac: RRR, no murmurs, rubs, or gallops.  Respiratory: Clear to auscultation bilaterally. GI: Soft, nontender, non-distended  MS: extremities are cool, no edema Neuro:  Nonfocal  Psych: Normal affect   Labs    High Sensitivity Troponin:   Recent Labs   Lab 08/14/23 2341 08/15/23 0101  TROPONINIHS 117* 119*     Chemistry Recent Labs  Lab 08/15/23 0101  NA 133*  K 4.7  CL 100  CO2 26  GLUCOSE 81  BUN 39*  CREATININE 1.23*  CALCIUM  8.9  MG 2.2  PROT 10.8*  ALBUMIN  2.3*  AST 936*  ALT 346*  ALKPHOS 112  BILITOT 1.8*  GFRNONAA 51*  ANIONGAP 7    Lipids No results for input(s): "CHOL", "TRIG", "HDL", "LABVLDL", "LDLCALC", "CHOLHDL" in the last 168 hours.  Hematology Recent Labs  Lab 08/14/23 2341 08/16/23 0445  WBC 2.9* 3.6*  RBC 4.91 5.03  HGB 13.8 14.2  HCT 43.9 45.1  MCV 89.4 89.7  MCH 28.1 28.2  MCHC 31.4 31.5  RDW 18.4* 18.6*  PLT 122* 81*   Thyroid  Recent Labs  Lab 08/15/23 0101  TSH 5.963*    BNP Recent Labs  Lab 08/14/23 2341  BNP 1,054.0*    DDimer No results for input(s): "DDIMER" in the last 168 hours.   Radiology    DG CHEST PORT 1 VIEW Result Date: 08/16/2023 CLINICAL DATA:  Cough with weakness and shortness of breath. EXAM: PORTABLE CHEST 1 VIEW COMPARISON:  08/15/2023. FINDINGS: Leftward patient rotation. The cardio pericardial silhouette is enlarged. Possible retrocardiac atelectasis or infiltrate. Right lung clear. No acute bony abnormality. Telemetry leads overlie the chest. IMPRESSION: Rotated film with cardiomegaly. Possible retrocardiac left base atelectasis or infiltrate. Electronically Signed   By: Donnal Fusi M.D.   On: 08/16/2023 06:39   US  EKG SITE RITE Result Date: 08/15/2023 If Site Rite image not attached, placement could not be confirmed due to current cardiac rhythm.  DG Chest Port 1 View Result Date: 08/15/2023 CLINICAL DATA:  Shortness of breath with loss of appetite and diarrhea. EXAM: PORTABLE CHEST 1 VIEW COMPARISON:  July 14, 2023 FINDINGS: The cardiac silhouette is mildly enlarged and unchanged in size. There is no evidence of acute infiltrate, pleural effusion or pneumothorax. The visualized skeletal structures are unremarkable. IMPRESSION: No active  cardiopulmonary disease. Electronically Signed   By: Virgle Grime M.D.   On: 08/15/2023 00:30    Cardiac Studies     Patient Profile     [name] is a 58 y.o. female with a hx of  RA, hypothyroidism, prior stroke now wheelchair bound, chronic RV failure/pulmonary HTN who is being seen 08/15/2023 for the evaluation of SOB at the request of Dr Michaelene Admire.   Assessment & Plan    1.Acute on chronic RV failure/Pulmonary HTN/Cardiogenic shock - 07/2023 echo: LVEF 50-55%, mod to severe RV dysfunction, mod RVE, mod pulm HTN -  RHC on 07/15/23 with RA 4, PCWP 13, mPA 44 and PVR of 6.4 with CI of 2.4 L/min/m2  - VQ negative, CT without specific lung pathology - outpatient PFTs pending, does have long tobacco history - unclear etiology thus far of her precapillary pulmonary HTN   - was on sildenafil , stopped at 08/07/23 HF clinic visit. Started on midodrine  for low bp's - at 08/07/23 appt appeared dry, lasix  lowered to 20mg  MWF - from HF notes limited treatment options given her poor baseline functional status.    - BNP 1054, sinus tach, elevated LFTs.  - lactic 3-->4.7, extremities are cool this morning.  - Recent RHC borderline CI of 2.4 Concern would be low output right sided HF.  - PICC line placed this AM morning. COOX has been ordered. - received IV lasix  60mg  x 1 yesterday, I/Os incomplete, from nursing report very limited uop yesterday. Overnight team redosed IV lasix  60mg  x 1 earlier this AM. Labs are pending this morning. - had been on midodrine  as outpatient, continued here. MAPs 80s   Addnendum 940AM - Coox 47%. CMET came back later in the day with significant decline in renal function and further elevation in LFTs likely congestive hepatopathy. Starting milrinone 0.125 initially with repeat coox for 1pm. Discussed with primary team who will arrange [facility], discussed with Dr Mitzie Anda HF team who will evaluate patient at [facility] in consultation. Elevated K, EKG  has been ordered and calcium  gluconate has been given, primary team to manage hyperkalemia medication regimen.    Addendum 125pm Patient tachycardic this afternoon 130s with little variation in rates. Was stable in sinus rhythm 100s all morning. Tele reviewed, at 1230pm had a 12 beat run of a wide complex rhythm followed by consistent narrow complex tachy in the 130s with little rate variability. I suspect this is aflutter. Avoid av nodal agents given cardiogenic shock with RV failure. Elevated LFTs but amio really would be only option, she is low output RV faliure  on milrinone and tachycardia will not help her hemodynamics. New  onset aflutter, INR is already elevated to 2.3 in setting of ALI, would not commit to anticoag at this point. Of note TSH already elevated to 5.9 prior to initiation of amio, she is on synthroid  at home  Addendum 4pm Repeat afternoon coox  51%. Reluctant to increase milrinone at this time as she remains in aflutter vs atach on amio gtt in 120s. Continue current therapy at this time.      For questions or updates, please contact [facility] Please consult [email or web address] for contact info under        Signed, Armida Lander, MD  08/16/2023, 8:18 AM

## 2023-08-16 NOTE — Progress Notes (Signed)
 Peripherally Inserted Central Catheter Placement  The IV Nurse has discussed with the patient and/or persons authorized to consent for the patient, the purpose of this procedure and the potential benefits and risks involved with this procedure.  The benefits include less needle sticks, lab draws from the catheter, and the patient may be discharged home with the catheter. Risks include, but not limited to, infection, bleeding, blood clot (thrombus formation), and puncture of an artery; nerve damage and irregular heartbeat and possibility to perform a PICC exchange if needed/ordered by physician.  Alternatives to this procedure were also discussed.  Bard Power PICC patient education guide, fact sheet on infection prevention and patient information card has been provided to patient /or left at bedside.   Consent obtained by primary RN with family.  PICC Placement Documentation  PICC Double Lumen 08/16/23 Right Brachial 40 cm 1 cm (Active)  Indication for Insertion or Continuance of Line Vasoactive infusions 08/16/23 0700  Exposed Catheter (cm) 1 cm 08/16/23 0700  Site Assessment Clean, Dry, Intact 08/16/23 0700  Lumen #1 Status Flushed;Saline locked;Blood return noted 08/16/23 0700  Lumen #2 Status Blood return noted 08/16/23 0700  Dressing Type Transparent;Securing device 08/16/23 0700  Dressing Status Antimicrobial disc/dressing in place;Clean, Dry, Intact 08/16/23 0700  Line Care Connections checked and tightened 08/16/23 0700  Line Adjustment (NICU/IV Team Only) No 08/16/23 0700  Dressing Intervention New dressing;Adhesive placed at insertion site (IV team only) 08/16/23 0700  Dressing Change Due 08/23/23 08/16/23 0700       Roseanne Cones Renee 08/16/2023, 7:55 AM

## 2023-08-16 NOTE — Progress Notes (Signed)
 Palliative:   [name] is lying quietly in bed.  She appears acutely/chronically ill and frail.  She does not attempt to interact with me in any meaningful way.  She will briefly open her eyes, but not make eye contact.  She clearly cannot make her needs known.  There is no family at bedside at this time.  Face-to-face conference with bedside nursing staff related to patient condition, needs, goals of care.  Call to daughter/HCPOA, [name].  We talked about [name] of acute and chronic health concerns and her recent declines.  We talked about the note from cardiology today listing cardiogenic shock.  We talked about transfer to [facility] when possible.  [name] is in agreement.  She tells me that she is following labs and progress notes through MyChart.  We talked about CODE STATUS, "treat the treatable, but allow a natural passing".  [name] states that she and her mother have discussed and she is full code.  I ask if they had discussed how long [name] would accept life support.  Just because shares that that is not something that they have discussed.  We talked about a few "what if's and maybe's".  No further questions at this time.  Conference with attending, bedside nursing staff and transition of care team related to patient condition, needs, goals of care, disposition.  Plan: Continue full scope/full code, no limits set on life support at this time.  Time for outcomes.   50 minutes  Arla Lab, NP Palliative Medicine Team  Team Phone [phone]

## 2023-08-16 NOTE — Consult Note (Signed)
 Advanced Heart Failure Team Consult Note   Primary Physician: [facility] Cardiologist:  Ola Berger, MD [facility]: Dr. Bruce Caper    Reason for Consultation: Acute on Chronic RV Failure and Pulmonary HTN   HPI:    [name] is seen today for evaluation of acute on chronic RV and pulmonary HTN at the request of Dr. Amanda Jungling, Cardiology.   [name] is a 58 y.o. with a history of CVA w/ residual left hemiparesis, HTN, HLD, anemia, seropositive rheumatoid arthritis, and hypothyroidism and recently diagnosed w/ severe RV failure and PH. Functionally limited and has been bed bound or in a wheel chair.    Followed by Rheumatology for seropositive RA. She has been on Humira  and methotrexate . Looks like this was started 2023.    Admitted 4/24 to [facility] with acute RV failure. Echo showed EF 50-55%, RV severely reduced, RVSP 47. Initially placed on IV lasix  but later stopped 2/2 RV failure and hypotension. Transferred to [facility] and underwent RHC showing normal filling pressures and severely elevated PVR. V/Q scan showed no evidence of pulmonary embolism. She was started in sildenafil  20 mg tid and discharged home, weight 222 lbs.  She was seen for hospital f/u in the [facility] 5/5. She continued w/ weakness and was hypotensive. Felt to be on the dry side. Case was d/w MD and decision was made to decrease Lasix  to MWF. Sildenafil  was stopped due to hypotension and midodrine  was added. Pt declined getting PFTs done (cited feeling to weak to peruse).   [facility] readmitted to [facility] on 05/[facility] for a/c RV failure and cardiogenic shock. Also in new AFL w/ RVR.  BNP 1054, Lactic acid 3>4.7>1.5.  SCr 1.2>1.9>2.19, AST has trended up to 1650 and ALT 583. Initial Co-ox 47%. She is now on 0.25 milrinone with improvement in CO-OX to 61% but has had little urine output w/ intermittent IV lasix . She was transferred to [facility] for evaluation by AHF and management of cardiogenic shock.  On arrival to [facility] patient on milrinone. Very weak. Not able to provide much history but knows she is at a hospital in [place]. Blood sugar 57. SBP 100  Family at bedside says that she has been essentially bedbound since last hospitalization. Very weak.    Home Medications Prior to Admission medications   Medication Sig Start Date End Date Taking? Authorizing Provider  acetaminophen  (TYLENOL ) 500 MG tablet Take 1,000 mg by mouth every 6 (six) hours as needed for mild pain.   Yes [provider]  aspirin  EC 81 MG tablet Take 81 mg by mouth daily. Swallow whole.   Yes [provider]  furosemide  (LASIX ) 20 MG tablet Take 1 tablet (20 mg total) by mouth every Monday, Wednesday, and Friday. 08/07/23  Yes Milford, Jessica M, FNP  HUMIRA , 2 PEN, 40 MG/0.4ML pen INJECT 40 MG INTO THE SKIN EVERY 14 DAYS 07/17/23  Yes Rice, Haig Levan, MD  levothyroxine  (SYNTHROID ) 50 MCG tablet Take 50 mcg by mouth daily before breakfast.   Yes [provider]  midodrine  (PROAMATINE ) 2.5 MG tablet Take 1 tablet (2.5 mg total) by mouth 3 (three) times daily with meals. 08/07/23  Yes Milford, Arlice Bene, FNP  traZODone (DESYREL) 100 MG tablet Take 100 mg by mouth at bedtime.   Yes [provider]    Past Medical History: Past Medical History:  Diagnosis Date   High cholesterol    Hypertension    Hypothyroidism    Stroke (  HCC)    weakness on left side.   Vaginal Pap smear, abnormal     Past Surgical History: Past Surgical History:  Procedure Laterality Date   DILATION AND CURETTAGE OF UTERUS     LASER ABLATION CONDOLAMATA Bilateral 09/27/2017   Procedure: LASER ABLATION CONDYLOMA AND REMOVAL OF PERIANAL WART;  Surgeon: Wendelyn Halter, MD;  Location: [facility];  Service: Gynecology;  Laterality: Bilateral;   MASS EXCISION Left 09/27/2017   Procedure: EXCISION LESION LEFT THIGH;  Surgeon: Wendelyn Halter, MD;  Location: [facility];  Service: Gynecology;  Laterality: Left;   RIGHT HEART CATH N/A  07/14/2023   Procedure: RIGHT HEART CATH;  Surgeon: Alwin Baars, DO;  Location: [facility] INVASIVE CV LAB;  Service: Cardiovascular;  Laterality: N/A;    Family History: Family History  Problem Relation Age of Onset   Hypertension Mother    Liver disease Mother    Heart attack Father    HIV Sister    Arrhythmia Sister    Mood Disorder Sister    Arrhythmia Brother    CAD Brother     Social History: Social History   Socioeconomic History   Marital status: Married    Spouse name: Not on file   Number of children: Not on file   Years of education: Not on file   Highest education level: Not on file  Occupational History   Not on file  Tobacco Use   Smoking status: Every Day    Current packs/day: 0.50    Average packs/day: 0.5 packs/day for 32.0 years (16.0 ttl pk-yrs)    Types: Cigarettes   Smokeless tobacco: Never  Vaping Use   Vaping status: Never Used  Substance and Sexual Activity   Alcohol use: No   Drug use: Yes    Types: Marijuana    Comment: occ   Sexual activity: Not Currently    Birth control/protection: Post-menopausal  Other Topics Concern   Not on file  Social History Narrative   Grew up in [place], [place].   Engaged to be married.   5 children.    Eats mainly meat/junk food.   Social Drivers of Corporate investment banker Strain: Low Risk  (04/07/2023)   Received from [facility]   Overall Financial Resource Strain (CARDIA)    Difficulty of Paying Living Expenses: Not hard at all  Food Insecurity: No Food Insecurity (08/15/2023)   Hunger Vital Sign    Worried About Running Out of Food in the Last Year: Never true    Ran Out of Food in the Last Year: Never true  Transportation Needs: No Transportation Needs (08/15/2023)   PRAPARE - Administrator, Civil Service (Medical): No    Lack of Transportation (Non-Medical): No  Physical Activity: Sufficiently Active (08/31/2021)   Exercise Vital Sign    Days of Exercise per Week: 3 days     Minutes of Exercise per Session: 60 min  Recent Concern: Physical Activity - Inactive (07/07/2021)   [facility] from [facility]   Exercise Vital Sign    Days of Exercise per Week: 0 days    Minutes of Exercise per Session: 0 min  Stress: No Stress Concern Present (08/31/2021)   [employer] of Occupational Health - Occupational Stress Questionnaire    Feeling of Stress : Not at all  Social Connections: Unknown (07/08/2022)   [facility]   Social Network    Social Network: Not on file  Allergies:  Allergies  Allergen Reactions   Lisinopril  Nausea Only    Objective:    Vital Signs:   Temp:  [97.6 F (36.4 C)-100.1 F (37.8 C)] 97.6 F (36.4 C) (05/14 1235) Pulse Rate:  [91-132] 129 (05/14 1345) Resp:  [22-40] 30 (05/14 1345) BP: (82-147)/(47-109) 107/86 (05/14 1345) SpO2:  [96 %-100 %] 98 % (05/14 1235) Weight:  [96.1 kg] 96.1 kg (05/14 0704) Last BM Date : 08/15/23  Weight change: Filed Weights   08/14/23 2304 08/15/23 1025 08/16/23 0704  Weight: 112 kg 95.7 kg 96.1 kg    Intake/Output:   Intake/Output Summary (Last 24 hours) at 08/16/2023 1608 Last data filed at 08/16/2023 0341 Gross per 24 hour  Intake 103 ml  Output --  Net 103 ml      Physical Exam   General: Weak Terminally ill appearing. No resp difficulty HEENT: normal Neck: supple. JVP to jaw Carotids 2+ bilat; no bruits. ZOX:WRUEAVW 2/6 TR + RV heave Lungs: clear Abdomen: soft, nontender, nondistended. No hepatosplenomegaly. No bruits or masses. Good bowel sounds. Extremities: no cyanosis, clubbing, rash, edema cool  Neuro: awake lethargic moves all four.    Telemetry   Sinus 90-100 Personally reviewed   EKG    Sinus 85 + LVH Personally reviewed  Labs   Basic Metabolic Panel: Recent Labs  Lab 08/15/23 0101 08/16/23 0445  NA 133* 130*  K 4.7 6.1*  CL 100 99  CO2 26 23  GLUCOSE 81 128*  BUN 39* 53*  CREATININE 1.23* 1.93*   CALCIUM  8.9 8.6*  MG 2.2  --     Liver Function Tests: Recent Labs  Lab 08/15/23 0101 08/16/23 0445  AST 936* 1,311*  ALT 346* 479*  ALKPHOS 112 107  BILITOT 1.8* 2.4*  PROT 10.8* >12.0*  ALBUMIN  2.3* 2.4*   No results for input(s): "LIPASE", "AMYLASE" in the last 168 hours. Recent Labs  Lab 08/16/23 0906  AMMONIA 50*    CBC: Recent Labs  Lab 08/14/23 2341 08/16/23 0445  WBC 2.9* 3.6*  NEUTROABS 1.8  --   HGB 13.8 14.2  HCT 43.9 45.1  MCV 89.4 89.7  PLT 122* 81*    Cardiac Enzymes: No results for input(s): "CKTOTAL", "CKMB", "CKMBINDEX", "TROPONINI" in the last 168 hours.  BNP: BNP (last 3 results) Recent Labs    07/09/23 1320 08/14/23 2341  BNP 566.0* 1,054.0*    ProBNP (last 3 results) No results for input(s): "PROBNP" in the last 8760 hours.   CBG: Recent Labs  Lab 08/16/23 1144  GLUCAP 114*    Coagulation Studies: Recent Labs    08/16/23 0801  LABPROT 25.6*  INR 2.3*     Imaging   DG CHEST PORT 1 VIEW Result Date: 08/16/2023 CLINICAL DATA:  Cough with weakness and shortness of breath. EXAM: PORTABLE CHEST 1 VIEW COMPARISON:  08/15/2023. FINDINGS: Leftward patient rotation. The cardio pericardial silhouette is enlarged. Possible retrocardiac atelectasis or infiltrate. Right lung clear. No acute bony abnormality. Telemetry leads overlie the chest. IMPRESSION: Rotated film with cardiomegaly. Possible retrocardiac left base atelectasis or infiltrate. Electronically Signed   By: Donnal Fusi M.D.   On: 08/16/2023 06:39     Medications:     Current Medications:  aspirin   300 mg Rectal Daily   budesonide (PULMICORT) nebulizer solution  0.5 mg Nebulization BID   Chlorhexidine  Gluconate Cloth  6 each Topical Q0600   levothyroxine   50 mcg Oral QAC breakfast   midodrine   2.5 mg Oral TID WC  pantoprazole (PROTONIX) IV  40 mg Intravenous Q24H   sodium chloride  flush  10-40 mL Intracatheter Q12H   sodium chloride  flush  3 mL Intravenous  Q12H    Infusions:  amiodarone 60 mg/hr (08/16/23 1354)   Followed by   amiodarone     cefTRIAXone  (ROCEPHIN )  IV Stopped (08/15/23 2141)   milrinone 0.125 mcg/kg/min (08/16/23 0934)      Patient Profile   58 y.o. with a history of CVA w/ residual left hemiparesis, HTN, HLD, anemia, seropositive rheumatoid arthritis, and hypothyroidism and recently diagnosed w/ severe RV failure and PH. Admitted for acute on chronic RV failure with Cardiogenic shock.  Assessment/Plan   1. Acute on Chronic RV Failure w/ Cardiogenic Shock / Pulmonary HTN  - End-stage RV failure - Echo 4/25 preserved LVEF, 50-55%, and severely reduced RV  - RHC 4/25: (RA 4, PA 69/29 m44, PCWP 13, PVR of 6.4 with CI of 2.4 L/min/m2). - V/Q scan negative. Pt declined PFT evaluation  - Failed Sildenafil  due to hypotension. Started on Midodrine   - now w/ CS w/ lactic acidosis, shock liver and AKI. LA 4.7, Co-ox 47%. Now on Milrinone 0.25 mcg/kg/min -> co-ox 61% - continue milrinone support and diuresis w/ IV Lasix . Follow lactate, co-ox and CVP - continue midodrine  for BP support  - she is end-stage. Has been essentially bedbound since last admission. There are no durable solutions for her.  - Can continue milrinone for now - I discussed her situation with family and how grave I think her situation is. They continue to have a hard time accepting how sick she is - Continue Palliative Care involvement.   2. Shock Liver - AST 1650/ALT 583 - NH3 elevated at 50 - Continue inotropic support - watch for encephalopathy. Defer initiation of lactulose to IM   3. AKI on CKD IIIa - SCr 1.2>>1.9 (b/l 0.6) -> 2.2 - CG shock management per above - support BP w/ midodrine  - daily BMETs  4. Hyperkalemia - K 6.1 in setting of ARF - treated w/ Lokelma. K now 3/9  5. Seropositive Rheumatoid Arthritis  - on Humira  and methotrexate  as outpatient  - Followed by Dr. Rodell Citrin  She has end-stage RV failure with cardiogenic shock and  multi-system organ failure. Hemodynamically improving with milrinone. Will continue for now. Unfortunately we do nt have any durable options for her. Continue Palliative Care discussions.   CRITICAL CARE Performed by: Jules Oar  Total critical care time: 50 minutes  Critical care time was exclusive of separately billable procedures and treating other patients.  Critical care was necessary to treat or prevent imminent or life-threatening deterioration.  Critical care was time spent personally by me (independent of midlevel providers or residents) on the following activities: development of treatment plan with patient and/or surrogate as well as nursing, discussions with consultants, evaluation of patient's response to treatment, examination of patient, obtaining history from patient or surrogate, ordering and performing treatments and interventions, ordering and review of laboratory studies, ordering and review of radiographic studies, pulse oximetry and re-evaluation of patient's condition.     Length of Stay: 1  Jules Oar, MD  7:04 PM     Advanced Heart Failure Team Pager [phone] (M-F; 7a - 5p)  Please contact [facility] for night-coverage after hours (4p -7a ) and weekends on [email or web address]

## 2023-08-16 NOTE — Significant Event (Signed)
 Notified by RN of decreased level of consciousness / altered mentation. Per prior notes had been conversant, Per RN previously A+o x 2 (self, place). Did receive dilaudid  0.5 mg IV before midnight. This morning on assessment noted that she is more somnolent more disoriented. On my assessment she is able to answer yes to her name but otherwise does not participate. VS borderline temp at 37.8, tachycardic in the 100s, on 3L O2 with sat 100%.  she awakens to moderate nonpainful stim, but falls asleep easily. Pupils are pinpoint bilaterally. She has chronic hemiplegia reported. She does not follow commands on the R but does localize to pain on this side. Otherwise tachycardic, SEM, rales. Per RN no UOP over shift with Data ALI, AKI, elevated lactate. Concern all representing low output RV failure with resultant congestive hepatopathy, cardiorenal injury.   Decreased level of consciousness / encephalopathy  Question med adverse effect, hypercarbia, hyperammonemia, metabolic I/s/o multiorgan failure, ischemic injury.   - Check VBG, ammonia. Awaiting AM CMP  - Wean oxygen to target POX 92%  - Hold Dilaudid  - Consider CT head if above unrevealing  RV failure, c/f low ouput  Worsening multiorgan failure  - Redose Lasix  60 mg IV, monitor UOP  - Follow-up lactate - Needs PICC  Arnulfo Larch, MD  [facility]

## 2023-08-16 NOTE — Plan of Care (Deleted)
  Problem: Clinical Measurements: Goal: Diagnostic test results will improve Outcome: Not Progressing Goal: Cardiovascular complication will be avoided Outcome: Not Progressing   Problem: Activity: Goal: Risk for activity intolerance will decrease Outcome: Not Progressing   Problem: Nutrition: Goal: Adequate nutrition will be maintained Outcome: Not Progressing

## 2023-08-16 NOTE — Plan of Care (Signed)
  Problem: Clinical Measurements: Goal: Diagnostic test results will improve Outcome: Not Progressing Goal: Cardiovascular complication will be avoided Outcome: Not Progressing   Problem: Activity: Goal: Risk for activity intolerance will decrease Outcome: Not Progressing   Problem: Nutrition: Goal: Adequate nutrition will be maintained Outcome: Not Progressing

## 2023-08-16 NOTE — Progress Notes (Signed)
 Patient during the beginning of the shift uncomfortable, changed positions multiple times, cleaned her up, ordered additional fan as requested by pt. At around 2300h, patient verbalized she has generalized pain and tried pulling out contraptions, becoming aitated, grand daughter was at bedside and this RN talked and updated daughter, [name] which is the main Management consultant. Pain medication given and patient able to get some rest after. Upon reassessment at around 0430h, pt just responsive to pain, difficult to arouse, not peed after an episode of incontinence during the beginning of the shift, CBG taken= 116, bladder scan=66ml, has a temperature=100.1 rectally. Informed MD and came to bedside to assess pt. Chest xray done. PICC nurse in to insert PICC. Called and updated daughter [name] this morning. Report given to incoming nurse

## 2023-08-16 NOTE — Hospital Course (Addendum)
 [name] was admitted to the hospital with the working diagnosis of heart failure exacerbation.   58 y.o. 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   Patient reports symptoms has been present for the last week prior to admission with orthopnea, dyspnea on exertion and lower extremity edema.  On her initial physical examination her blood pressure was 118/94, HR 112, RR 22 and 02 saturation 100% on supplemental 02 per [place]  Lungs with positive bilateral wheezing and decreased breath sounds, heart with S1 and S2 present and regular with no gallops, abdomen with no distention and positive lower extremity edema.   VBG 7,39/ 50/ <31/30/27.8%   Na 133, K 4.7 Cl 100, bicarbonate 25 glucose 81, bun 39 cr 1.23 Mg 2.2  AST 936 ALT 346 total bilirubin 1.8  High sensitive troponin 117 and 119  BNP 1,054  Lactic acid 3,0  Wbc 2.9 hgb 13,8 plt 122  TSH 5.9  Sars covid 19 negative Influenza negative  RSV negative   Urine analsys SG 1,011, protein 100, positive nitrates, moderate leukocytes and moderate hgb, 21-50 wbc and 0-5 rbc  Urine culture positive for E coli > 100,000 CFU    Chest radiograph with left rotation, cardiomegaly, bilateral hilar vascular congestion with fluid in the right fissure.   EKG 107 bpm, left axis deviation, normal intervals, qtc 453, sinus rhythm with bi atrial enlargement, with no significant ST segment changes, negative T wave lead I and aVL.    Patient placed on IV ceftriaxone  for antibiotic therapy and with IV furosemide  for diuresis.  Noted low output failure and placed on inotropic support with milrinone .  05/16 Transferred to [facility]. Advanced heart failure consultation, determined patient end stage right heart failure.  05/18 continue very  poor prognosis.  05/20 Palliative care has been consulted. Hospice meeting with family tomorrow. Possible home milrinone  under hospice services.

## 2023-08-16 NOTE — Progress Notes (Addendum)
 PROGRESS NOTE  [name] ZOX:[id number] DOB: 1965/07/04 DOA: 08/14/2023 PCP: [facility]  Brief History:   58 y.o. female with medical history significant of tobacco abuse, hypertension, hyperlipidemia, hypothyroidism and, history of stroke with left-sided residual deficits and dysarthria, pulmonary hypertension and right-sided heart failure; who presented to the hospital secondary to shortness of breath and weakness.  Patient reports symptom has been present for the last week or so and worsening.  Currently with difficulty laying down flat as she feels gasping for air and also noticing dyspnea on exertion.  Patient expressed increased swelling in her legs and some difficulty speaking in full sentences. Patient's husband at bedside at information of increased frequency and further deconditioning/generalized weakness.  No descriptions of focal deficits, sick contacts, fever, chest pain, hematuria, melena, hematochezia or any other complaints.   Workup in the ED demonstrating elevated BNP, vascular congestion on chest x-ray and positive urinalysis for UTI.  Physical examination revealed tachypnea, decreased breath sounds at the bases and expiratory wheezing.   Steroids, nebulizer treatment, IV Lasix  and antibiotics after cultures taken has been provided; [facility] consulted for further evaluation and management.   Assessment/Plan:  Acute on chronic HFpEF, RV failure - Appreciate cardiology - Furosemide  60 mg IV dosed 08/15/23 x 2 and x 1 on 5/14 - Lactic acid 3.0 - PICC line placed a.m. 08/16/2023 - 07/11/2023 echo EF 50-5%, moderate to severe decreased RV function, trivial MR, RV overload - discussed with cardiology, Dr. Cari Char milrinone and transfer to [facility] for AHF team to see  Acute metabolic encephalopathy - Multifactorial including opioids, possibly hepatic congestion with elevated ammonia, UTI, and respiratory failure - VBG--7.23/59/36 -  Check ammonia - UA 21-50 WBC--already on ceftriaxone  - B12--3076 - Folic acid  - TSH 5.963 -d/c opioids and hypnotic meds  Essential hypertension>> hypotension - BPs have been soft - Continue midodrine   Hyperkalemia -Ca gluconate, bicarbonate IV, D50 and IV insulin -remain on tele -lokelma  Transaminasemia -due to RV failure -viral hepatitis panel -RUQ ultrasound  Hypothyroidism - Continue Synthroid  if able to tolerate p.o.  Mixed hyperlipidemia - Holding statin secondary to elevated LFTs  History of stroke - Patient has left hemiparesis at baseline with dysarthria - Continue aspirin   Pulmonary hypertension - was on sildenafil , stopped at 08/07/23 HF clinic visit. Started on midodrine  for low bp's  -07/14/23 V/Q neg      Family Communication: spouse updated 5/14  Consultants:  cardiology  Code Status:  FULL   DVT Prophylaxis:  SCDs   Procedures: As Listed in Progress Note Above  Antibiotics: Ceftriaxine 5/13>>     Subjective: Pt is confused.  ROS not possible  Objective: Vitals:   08/16/23 0200 08/16/23 0300 08/16/23 0400 08/16/23 0500  BP: 106/82 (!) 94/47  (!) 111/93  Pulse:      Resp: (!) 23 (!) 23 (!) 22 (!) 24  Temp:    100.1 F (37.8 C)  TempSrc:    Rectal  SpO2: 97%   100%  Weight:      Height:        Intake/Output Summary (Last 24 hours) at 08/16/2023 0810 Last data filed at 08/16/2023 0341 Gross per 24 hour  Intake 103 ml  Output 500 ml  Net -397 ml   Weight change: -16.3 kg Exam:  General:  Pt is alert, does not follow commands appropriately, not in acute distress HEENT: No icterus, No thrush, No neck mass, /AT Cardiovascular: RRR,  S1/S2, no rubs, no gallops Respiratory: bilateral rales. Abdomen: Soft/+BS, non tender, non distended, no guarding Extremities: No edema, No lymphangitis, No petechiae, No rashes, no synovitis   Data Reviewed: I have personally reviewed following labs and imaging studies Basic Metabolic  Panel: Recent Labs  Lab 08/15/23 0101  NA 133*  K 4.7  CL 100  CO2 26  GLUCOSE 81  BUN 39*  CREATININE 1.23*  CALCIUM  8.9  MG 2.2   Liver Function Tests: Recent Labs  Lab 08/15/23 0101  AST 936*  ALT 346*  ALKPHOS 112  BILITOT 1.8*  PROT 10.8*  ALBUMIN  2.3*   No results for input(s): "LIPASE", "AMYLASE" in the last 168 hours. No results for input(s): "AMMONIA" in the last 168 hours. Coagulation Profile: No results for input(s): "INR", "PROTIME" in the last 168 hours. CBC: Recent Labs  Lab 08/14/23 2341 08/16/23 0445  WBC 2.9* 3.6*  NEUTROABS 1.8  --   HGB 13.8 14.2  HCT 43.9 45.1  MCV 89.4 89.7  PLT 122* 81*   Cardiac Enzymes: No results for input(s): "CKTOTAL", "CKMB", "CKMBINDEX", "TROPONINI" in the last 168 hours. BNP: Invalid input(s): "POCBNP" CBG: No results for input(s): "GLUCAP" in the last 168 hours. HbA1C: No results for input(s): "HGBA1C" in the last 72 hours. Urine analysis:    Component Value Date/Time   COLORURINE YELLOW 08/14/2023 2355   APPEARANCEUR HAZY (A) 08/14/2023 2355   LABSPEC 1.011 08/14/2023 2355   PHURINE 5.0 08/14/2023 2355   GLUCOSEU NEGATIVE 08/14/2023 2355   HGBUR MODERATE (A) 08/14/2023 2355   BILIRUBINUR NEGATIVE 08/14/2023 2355   KETONESUR NEGATIVE 08/14/2023 2355   PROTEINUR 100 (A) 08/14/2023 2355   NITRITE POSITIVE (A) 08/14/2023 2355   LEUKOCYTESUR MODERATE (A) 08/14/2023 2355   Sepsis Labs: @LABRCNTIP (procalcitonin:4,lacticidven:4) ) Recent Results (from the past 240 hours)  Resp panel by RT-PCR (RSV, Flu A&B, Covid) Anterior Nasal Swab     Status: None   Collection Time: 08/15/23 12:09 AM   Specimen: Anterior Nasal Swab  Result Value Ref Range Status   SARS Coronavirus 2 by RT PCR NEGATIVE NEGATIVE Final    Comment: (NOTE) SARS-CoV-2 target nucleic acids are NOT DETECTED.  The SARS-CoV-2 RNA is generally detectable in upper respiratory specimens during the acute phase of infection. The  lowest concentration of SARS-CoV-2 viral copies this assay can detect is 138 copies/mL. A negative result does not preclude SARS-Cov-2 infection and should not be used as the sole basis for treatment or other patient management decisions. A negative result may occur with  improper specimen collection/handling, submission of specimen other than nasopharyngeal swab, presence of viral mutation(s) within the areas targeted by this assay, and inadequate number of viral copies(<138 copies/mL). A negative result must be combined with clinical observations, patient history, and epidemiological information. The expected result is Negative.  Fact Sheet for Patients:  BloggerCourse.com  Fact Sheet for Healthcare Providers:  SeriousBroker.it  This test is no t yet approved or cleared by the [place]  FDA and  has been authorized for detection and/or diagnosis of SARS-CoV-2 by FDA under an Emergency Use Authorization (EUA). This EUA will remain  in effect (meaning this test can be used) for the duration of the COVID-19 declaration under Section 564(b)(1) of the Act, 21 U.S.C.section 360bbb-3(b)(1), unless the authorization is terminated  or revoked sooner.       Influenza A by PCR NEGATIVE NEGATIVE Final   Influenza B by PCR NEGATIVE NEGATIVE Final    Comment: (NOTE) The Xpert Xpress SARS-CoV-2/FLU/RSV  plus assay is intended as an aid in the diagnosis of influenza from Nasopharyngeal swab specimens and should not be used as a sole basis for treatment. Nasal washings and aspirates are unacceptable for Xpert Xpress SARS-CoV-2/FLU/RSV testing.  Fact Sheet for Patients: BloggerCourse.com  Fact Sheet for Healthcare Providers: SeriousBroker.it  This test is not yet approved or cleared by the [place]  FDA and has been authorized for detection and/or diagnosis of SARS-CoV-2 by FDA under  an Emergency Use Authorization (EUA). This EUA will remain in effect (meaning this test can be used) for the duration of the COVID-19 declaration under Section 564(b)(1) of the Act, 21 U.S.C. section 360bbb-3(b)(1), unless the authorization is terminated or revoked.     Resp Syncytial Virus by PCR NEGATIVE NEGATIVE Final    Comment: (NOTE) Fact Sheet for Patients: BloggerCourse.com  Fact Sheet for Healthcare Providers: SeriousBroker.it  This test is not yet approved or cleared by the [place]  FDA and has been authorized for detection and/or diagnosis of SARS-CoV-2 by FDA under an Emergency Use Authorization (EUA). This EUA will remain in effect (meaning this test can be used) for the duration of the COVID-19 declaration under Section 564(b)(1) of the Act, 21 U.S.C. section 360bbb-3(b)(1), unless the authorization is terminated or revoked.  Performed at [facility], [address] [place], [place] [address]      Scheduled Meds:  aspirin  EC  325 mg Oral Daily   budesonide (PULMICORT) nebulizer solution  0.5 mg Nebulization BID   Chlorhexidine  Gluconate Cloth  6 each Topical Q0600   levothyroxine   50 mcg Oral QAC breakfast   methylPREDNISolone  (SOLU-MEDROL ) injection  40 mg Intravenous Q12H   midodrine   2.5 mg Oral TID WC   pantoprazole  40 mg Oral Daily   sodium chloride  flush  10-40 mL Intracatheter Q12H   sodium chloride  flush  3 mL Intravenous Q12H   Continuous Infusions:  sodium chloride      cefTRIAXone  (ROCEPHIN )  IV Stopped (08/15/23 2141)    Procedures/Studies: DG CHEST PORT 1 VIEW Result Date: 08/16/2023 CLINICAL DATA:  Cough with weakness and shortness of breath. EXAM: PORTABLE CHEST 1 VIEW COMPARISON:  08/15/2023. FINDINGS: Leftward patient rotation. The cardio pericardial silhouette is enlarged. Possible retrocardiac atelectasis or infiltrate. Right lung clear. No acute bony abnormality. Telemetry leads  overlie the chest. IMPRESSION: Rotated film with cardiomegaly. Possible retrocardiac left base atelectasis or infiltrate. Electronically Signed   By: Donnal Fusi M.D.   On: 08/16/2023 06:39   US  EKG SITE RITE Result Date: 08/15/2023 If Site Rite image not attached, placement could not be confirmed due to current cardiac rhythm.  DG Chest Port 1 View Result Date: 08/15/2023 CLINICAL DATA:  Shortness of breath with loss of appetite and diarrhea. EXAM: PORTABLE CHEST 1 VIEW COMPARISON:  July 14, 2023 FINDINGS: The cardiac silhouette is mildly enlarged and unchanged in size. There is no evidence of acute infiltrate, pleural effusion or pneumothorax. The visualized skeletal structures are unremarkable. IMPRESSION: No active cardiopulmonary disease. Electronically Signed   By: Virgle Grime M.D.   On: 08/15/2023 00:30    Demaris Fillers, DO  [facility]  If 7PM-7AM, please contact night-coverage [email or web address] Password [email or web address] 08/16/2023, 8:10 AM   LOS: 1 day

## 2023-08-17 ENCOUNTER — Telehealth: Payer: Self-pay | Admitting: Internal Medicine

## 2023-08-17 ENCOUNTER — Inpatient Hospital Stay (HOSPITAL_COMMUNITY)

## 2023-08-17 ENCOUNTER — Other Ambulatory Visit (HOSPITAL_COMMUNITY): Payer: Self-pay | Admitting: *Deleted

## 2023-08-17 DIAGNOSIS — Z515 Encounter for palliative care: Secondary | ICD-10-CM | POA: Diagnosis not present

## 2023-08-17 DIAGNOSIS — Z7189 Other specified counseling: Secondary | ICD-10-CM | POA: Diagnosis not present

## 2023-08-17 DIAGNOSIS — Z720 Tobacco use: Secondary | ICD-10-CM | POA: Diagnosis not present

## 2023-08-17 DIAGNOSIS — R0609 Other forms of dyspnea: Secondary | ICD-10-CM | POA: Diagnosis not present

## 2023-08-17 DIAGNOSIS — I5033 Acute on chronic diastolic (congestive) heart failure: Secondary | ICD-10-CM | POA: Diagnosis not present

## 2023-08-17 DIAGNOSIS — R7401 Elevation of levels of liver transaminase levels: Secondary | ICD-10-CM | POA: Diagnosis not present

## 2023-08-17 DIAGNOSIS — R570 Cardiogenic shock: Secondary | ICD-10-CM | POA: Diagnosis not present

## 2023-08-17 LAB — GLUCOSE, CAPILLARY
Glucose-Capillary: 107 mg/dL — ABNORMAL HIGH (ref 70–99)
Glucose-Capillary: 100 mg/dL — ABNORMAL HIGH (ref 70–99)
Glucose-Capillary: 59 mg/dL — ABNORMAL LOW (ref 70–99)
Glucose-Capillary: 75 mg/dL (ref 70–99)
Glucose-Capillary: 115 mg/dL — ABNORMAL HIGH (ref 70–99)

## 2023-08-17 LAB — URINE CULTURE: Culture: >=100000 — AB

## 2023-08-17 LAB — ECHOCARDIOGRAM LIMITED
Calc EF: 44 %
Height: 66 in
S' Lateral: 3.5 cm
Single Plane A2C EF: 44.4 %
Single Plane A4C EF: 41.3 %
Weight: 3418.01 [oz_av]

## 2023-08-17 LAB — COMPREHENSIVE METABOLIC PANEL WITH GFR
ALT: 583 U/L — ABNORMAL HIGH (ref 0–44)
AST: 1650 U/L — ABNORMAL HIGH (ref 15–41)
Albumin: 2.1 g/dL — ABNORMAL LOW (ref 3.5–5.0)
Alkaline Phosphatase: 94 U/L (ref 38–126)
Anion gap: 13 (ref 5–15)
BUN: 67 mg/dL — ABNORMAL HIGH (ref 6–20)
CO2: 25 mmol/L (ref 22–32)
Calcium: 7.9 mg/dL — ABNORMAL LOW (ref 8.9–10.3)
Chloride: 92 mmol/L — ABNORMAL LOW (ref 98–111)
Creatinine, Ser: 2.19 mg/dL — ABNORMAL HIGH (ref 0.44–1.00)
GFR, Estimated: 26 mL/min — ABNORMAL LOW (ref >60)
Glucose, Bld: 329 mg/dL — ABNORMAL HIGH (ref 70–99)
Potassium: 3.9 mmol/L (ref 3.5–5.1)
Sodium: 130 mmol/L — ABNORMAL LOW (ref 135–145)
Total Bilirubin: 2.3 mg/dL — ABNORMAL HIGH (ref 0.0–1.2)
Total Protein: 10.1 g/dL — ABNORMAL HIGH (ref 6.5–8.1)

## 2023-08-17 LAB — BLOOD GAS, VENOUS
Acid-base deficit: 0.5 mmol/L (ref 0.0–2.0)
Bicarbonate: 26.3 mmol/L (ref 20.0–28.0)
Drawn by: 442
O2 Saturation: 61.7 %
Patient temperature: 36.5
pCO2, Ven: 50 mmHg (ref 44–60)
pH, Ven: 7.33 (ref 7.25–7.43)
pO2, Ven: 40 mmHg (ref 32–45)

## 2023-08-17 LAB — CBC
HCT: 33.5 % — ABNORMAL LOW (ref 36.0–46.0)
Hemoglobin: 10.7 g/dL — ABNORMAL LOW (ref 12.0–15.0)
MCH: 28.4 pg (ref 26.0–34.0)
MCHC: 31.9 g/dL (ref 30.0–36.0)
MCV: 88.9 fL (ref 80.0–100.0)
Platelets: 65 10*3/uL — ABNORMAL LOW (ref 150–400)
RBC: 3.77 MIL/uL — ABNORMAL LOW (ref 3.87–5.11)
RDW: 18 % — ABNORMAL HIGH (ref 11.5–15.5)
WBC: 4.8 10*3/uL (ref 4.0–10.5)
nRBC: 5.4 % — ABNORMAL HIGH (ref 0.0–0.2)

## 2023-08-17 LAB — LACTIC ACID, PLASMA: Lactic Acid, Venous: 1.5 mmol/L (ref 0.5–1.9)

## 2023-08-17 LAB — COOXEMETRY PANEL
Carboxyhemoglobin: 1.6 % — ABNORMAL HIGH (ref 0.5–1.5)
Methemoglobin: <0.7 % (ref 0.0–1.5)
O2 Saturation: 61.1 %
Total hemoglobin: 10.9 g/dL — ABNORMAL LOW (ref 12.0–16.0)

## 2023-08-17 LAB — MAGNESIUM: Magnesium: 2.3 mg/dL (ref 1.7–2.4)

## 2023-08-17 LAB — PROTIME-INR
INR: 2.6 — ABNORMAL HIGH (ref 0.8–1.2)
Prothrombin Time: 28.1 s — ABNORMAL HIGH (ref 11.4–15.2)

## 2023-08-17 LAB — PROCALCITONIN: Procalcitonin: 0.95 ng/mL

## 2023-08-17 MED ORDER — MIDODRINE HCL 5 MG PO TABS
5.0000 mg | ORAL_TABLET | Freq: Three times a day (TID) | ORAL | Status: DC
  Administered 2023-08-17 – 2023-08-26 (×25): 5 mg via ORAL
  Filled 2023-08-17 (×26): qty 1

## 2023-08-17 MED ORDER — CEFTRIAXONE SODIUM 2 G IJ SOLR: 2.0000 g | INTRAMUSCULAR | Status: DC

## 2023-08-17 MED ORDER — DEXTROSE 50 % IV SOLN
12.5000 g | INTRAVENOUS | Status: AC
Start: 2023-08-17 — End: 2023-08-17
  Administered 2023-08-17: 12.5 g via INTRAVENOUS
  Filled 2023-08-17: qty 50

## 2023-08-17 MED ORDER — CEFAZOLIN SODIUM-DEXTROSE 1-4 GM/50ML-% IV SOLN
1.0000 g | Freq: Three times a day (TID) | INTRAVENOUS | Status: DC
  Administered 2023-08-17 – 2023-08-20 (×9): 1 g via INTRAVENOUS
  Filled 2023-08-17 (×15): qty 50

## 2023-08-17 MED ORDER — NOREPINEPHRINE 4 MG/250ML-% IV SOLN: 0.0000 ug/min | INTRAVENOUS | Status: DC

## 2023-08-17 NOTE — Progress Notes (Incomplete)
 Blood pressure been soft, systolic as low as , MAP ranging from 60-104. MD informed. Given additional dose of midodrine . Stopped Amiodarone gtt at 9:53 PM due to BP being low. BP improved for a little bit then slowly decreased again. MD informed again.

## 2023-08-17 NOTE — Progress Notes (Addendum)
 Rounding Note    Patient Name: [name] Date of Encounter: 08/17/2023  [facility] Cardiologist: Ola Berger, MD   Subjective   Fatigued this morning.   Inpatient Medications    Scheduled Meds:  aspirin   300 mg Rectal Daily   budesonide (PULMICORT) nebulizer solution  0.5 mg Nebulization BID   Chlorhexidine  Gluconate Cloth  6 each Topical Q0600   levothyroxine   50 mcg Oral QAC breakfast   pantoprazole (PROTONIX) IV  40 mg Intravenous Q24H   sodium chloride  flush  10-40 mL Intracatheter Q12H   sodium chloride  flush  3 mL Intravenous Q12H   Continuous Infusions:  amiodarone 30 mg/hr (08/17/23 0317)   meropenem (MERREM) IV Stopped (08/16/23 2105)   milrinone 0.25 mcg/kg/min (08/17/23 0439)   norepinephrine (LEVOPHED) Adult infusion     PRN Meds: acetaminophen  **OR** acetaminophen , ipratropium-albuterol , prochlorperazine , sodium chloride  flush, sodium chloride  flush   Vital Signs    Vitals:   08/17/23 0700 08/17/23 0715 08/17/23 0747 08/17/23 0753  BP: (!) 81/54 (!) 77/54    Pulse: 91 89    Resp: (!) 23 (!) 22    Temp:   97.7 F (36.5 C)   TempSrc:   Axillary   SpO2: 100% 100%  100%  Weight:      Height:        Intake/Output Summary (Last 24 hours) at 08/17/2023 0809 Last data filed at 08/17/2023 0317 Gross per 24 hour  Intake 437.69 ml  Output --  Net 437.69 ml      08/17/2023    4:24 AM 08/16/2023    7:04 AM 08/15/2023   10:25 AM  Last 3 Weights  Weight (lbs) 213 lb 10 oz 211 lb 13.8 oz 210 lb 15.7 oz  Weight (kg) 96.9 kg 96.1 kg 95.7 kg      Telemetry    SR, intermittent atach vs aflutter - Personally Reviewed  ECG    N/a - Personally Reviewed  Physical Exam   GEN: No acute distress.   Neck: No JVD Cardiac: RRR, no murmurs, rubs, or gallops.  Respiratory: Clear to auscultation bilaterally. GI: Soft, nontender, non-distended  MS: No edema; No deformity. Neuro:  Nonfocal  Psych: Normal affect   Labs    High Sensitivity  Troponin:   Recent Labs  Lab 08/14/23 2341 08/15/23 0101  TROPONINIHS 117* 119*     Chemistry Recent Labs  Lab 08/15/23 0101 08/16/23 0445 08/16/23 1844  NA 133* 130*  --   K 4.7 6.1* 4.7  CL 100 99  --   CO2 26 23  --   GLUCOSE 81 128*  --   BUN 39* 53*  --   CREATININE 1.23* 1.93*  --   CALCIUM  8.9 8.6*  --   MG 2.2  --   --   PROT 10.8* >12.0*  --   ALBUMIN  2.3* 2.4*  --   AST 936* 1,311*  --   ALT 346* 479*  --   ALKPHOS 112 107  --   BILITOT 1.8* 2.4*  --   GFRNONAA 51* 30*  --   ANIONGAP 7 8  --     Lipids No results for input(s): "CHOL", "TRIG", "HDL", "LABVLDL", "LDLCALC", "CHOLHDL" in the last 168 hours.  Hematology Recent Labs  Lab 08/14/23 2341 08/16/23 0445 08/17/23 0438  WBC 2.9* 3.6* 4.8  RBC 4.91 5.03 3.77*  HGB 13.8 14.2 10.7*  HCT 43.9 45.1 33.5*  MCV 89.4 89.7 88.9  MCH 28.1 28.2 28.4  MCHC  31.4 31.5 31.9  RDW 18.4* 18.6* 18.0*  PLT 122* 81* 65*   Thyroid  Recent Labs  Lab 08/15/23 0101  TSH 5.963*    BNP Recent Labs  Lab 08/14/23 2341  BNP 1,054.0*    DDimer No results for input(s): "DDIMER" in the last 168 hours.   Radiology    DG CHEST PORT 1 VIEW Result Date: 08/16/2023 CLINICAL DATA:  Cough with weakness and shortness of breath. EXAM: PORTABLE CHEST 1 VIEW COMPARISON:  08/15/2023. FINDINGS: Leftward patient rotation. The cardio pericardial silhouette is enlarged. Possible retrocardiac atelectasis or infiltrate. Right lung clear. No acute bony abnormality. Telemetry leads overlie the chest. IMPRESSION: Rotated film with cardiomegaly. Possible retrocardiac left base atelectasis or infiltrate. Electronically Signed   By: Donnal Fusi M.D.   On: 08/16/2023 06:39   US  EKG SITE RITE Result Date: 08/15/2023 If Site Rite image not attached, placement could not be confirmed due to current cardiac rhythm.   Cardiac Studies    Patient Profile     [name] is a 58 y.o. female with a hx of RA, hypothyroidism, prior stroke now  wheelchair bound, chronic RV failure/pulmonary HTN who is being seen 08/15/2023 for the evaluation of SOB at the request of Dr Michaelene Admire.   Assessment & Plan    1.Acute on chronic RV failure/Pulmonary HTN/Cardiogenic shock - 07/2023 echo: LVEF 50-55%, mod to severe RV dysfunction, mod RVE, mod pulm HTN -  RHC on 07/15/23 with RA 4, PCWP 13, mPA 44 and PVR of 6.4 with CI of 2.4 L/min/m2  - VQ negative, CT without specific lung pathology - outpatient PFTs pending, does have long tobacco history - unclear etiology thus far of her precapillary pulmonary HTN  - was on sildenafil , stopped at 08/07/23 HF clinic visit. Started on midodrine  for low bp's - from HF notes limited treatment options given her poor baseline functional status.    - BNP 1054, sinus tach, elevated LFTs.  - lactic 3-->4.7-->1.5 - Initial coox 47%, milrinone started now at 0.25. COOX this AM 61%. CVP 6. Have not been diuresing.  - some low bp's at times, current MAPs 80s. Chronic hypotension has been on midodrine  at home, some doses held yesterday due to mental status. More alert today, increase her midodrine  to 5mg  tid.  - limited echo reassess LV and RV function    2. AKI - in setting of cardiogenic shock - little to no urine output. Rate of Cr elevation is declining, hopefully with increased renal perfusion with plateau and begin to improve.    3. ALI - in setting of cardiogenic shock   4. Aflutter vs atach - sudden onset of narrow complex regular tachycardia yesterday.Intermittent throughout the day. Can be misinterpreted as sinus tach, on tele review can see clear transition points to arrhythmia.  - avoid av nodal agents in setting of shock - limited options, despite elevated LFTs we did start amiodarone. Imperative to control rates in setting of low output heart failure - coexisting coagulatopathy with elevated INR and low platelets did not start anticoag.  - EKG shows back in NSR this AM though tele review shows  intermittent tachyarrhythmia, continue IV amio today.      5. Coagulatopathy - elevated INR, thrombocytopenia - management per primary team  6. History of prior CVA - chronically wheelchair bound, some aphasia     Patient awaiting transfer to [facility] to medicine team on progressive floor, HF team aware and will see in consultation.   For questions  or updates, please contact [facility] Please consult [email or web address] for contact info under        Signed, Armida Lander, MD  08/17/2023, 8:09 AM

## 2023-08-17 NOTE — Plan of Care (Signed)
  Problem: Clinical Measurements: Goal: Diagnostic test results will improve Outcome: Not Progressing Goal: Cardiovascular complication will be avoided Outcome: Not Progressing   Problem: Activity: Goal: Risk for activity intolerance will decrease Outcome: Not Progressing   Problem: Elimination: Goal: Will not experience complications related to urinary retention Outcome: Not Progressing

## 2023-08-17 NOTE — Progress Notes (Signed)
 Report called to [facility] [place]. [facility] set up for transport. [name] [facility]) made aware of transport. VSS currently. NSR on milrinone and amio. CVP currently 3.

## 2023-08-17 NOTE — Progress Notes (Addendum)
 PROGRESS NOTE  [name] NFA:[id number] DOB: 1965/04/24 DOA: 08/14/2023 PCP: [facility]  Brief History:   58 y.o. female with medical history significant of tobacco abuse, hypertension, hyperlipidemia, hypothyroidism and, history of stroke with left-sided residual deficits and dysarthria, pulmonary hypertension and right-sided heart failure; who presented to the hospital secondary to shortness of breath and weakness.  Patient reports symptom has been present for the last week or so and worsening.  Currently with difficulty laying down flat as she feels gasping for air and also noticing dyspnea on exertion.  Patient expressed increased swelling in her legs and some difficulty speaking in full sentences. Patient's husband at bedside at information of increased frequency and further deconditioning/generalized weakness.  No descriptions of focal deficits, sick contacts, fever, chest pain, hematuria, melena, hematochezia or any other complaints.   Workup in the ED demonstrating elevated BNP, vascular congestion on chest x-ray and positive urinalysis for UTI.  Physical examination revealed tachypnea, decreased breath sounds at the bases and expiratory wheezing.   Steroids, nebulizer treatment, IV Lasix  and antibiotics after cultures taken has been provided; TRH consulted for further evaluation and management.   Assessment/Plan: Acute on chronic HFpEF, RV failure/Cardiogenic shock - Appreciate cardiology - Furosemide  60 mg IV dosed 08/15/23 x 2 and x 1 on 5/14 - Lactic acid peaked 4.7>>1.5 - PICC line placed a.m. 08/16/2023 - 07/11/2023 echo EF 50-5%, moderate to severe decreased RV function, trivial MR, RV overload - discussed with cardiology, Dr. Shelton Dibbles milrinone and transfer to [facility] for AHF team to see -midodrine  for BP support -CVP = 10 in am 5/15   Acute metabolic encephalopathy - Multifactorial including opioids, possibly hepatic congestion  with elevated ammonia, UTI, AKI, ALI - repeat VBG--7.33/50/40/26 - Check ammonia - UA 21-50 WBC--already on ceftriaxone  - B12--3076 - Folic acid  22.2 - TSH 5.963 -d/c opioids and hypnotic meds  AKI/Cardiorenal syndrome -due to cardiogenic shock -baseline creatinine 0.6-0.8 -hopeful serum creatinine will plateau and start improving in next 24-48 hours  E-Coli UTI -d/c merrem -start cefazolin   Acute Liver Injury/Coagulopathy -anticipate LFTs will plateau and start improving in next 24-48 hours -pt refuses RUQ ultrasound -INR up to 2.6 -no signs of active bleeding presently -viral hepatitis panel negative -RUQ ultrasound--pt refuses  Hyperammonemia -defer lactulose for now -ammonia 50 -anticipate improvement with tx of cardiogenic shock   Essential hypertension>> hypotension - BPs have been soft - Continue midodrine  for BP support   Hyperkalemia -Ca gluconate, bicarbonate IV, D50 and IV insulin -remain on tele -lokelma -improved   Hypothyroidism - Continue Synthroid  if able to tolerate p.o.   Mixed hyperlipidemia - Holding statin secondary to elevated LFTs   History of stroke - Patient has left hemiparesis at baseline with dysarthria - Continue aspirin    Pulmonary hypertension - was on sildenafil , stopped at 08/07/23 HF clinic visit. Started on midodrine  for low bp's  -07/14/23 V/Q neg   Seropositive RA -last saw Dr. Rodell Citrin on 02/22/23 -on Humira  and methotrexate  -holding immunomodulators for now -continue folate  Obesity -BMI 34.48       Family Communication:daughter updated 5/15   Consultants:  cardiology   Code Status:  FULL    DVT Prophylaxis:  SCDs     Procedures: As Listed in Progress Note Above   Antibiotics: Ceftriaxine 5/13>>5/14 Merrem 5/14-5/15 -cefazolin  5/15>>               Subjective: Pt is more awake today.  Denies  cp, sob, n/v/d  Objective: Vitals:   08/17/23 0800 08/17/23 0815 08/17/23 0900 08/17/23 0930   BP: 109/73 106/73 121/86 91/64  Pulse:   (!) 114 (!) 117  Resp: (!) 21 (!) 22 (!) 25 (!) 25  Temp:      TempSrc:      SpO2:   100% 100%  Weight:      Height:        Intake/Output Summary (Last 24 hours) at 08/17/2023 0958 Last data filed at 08/17/2023 5176 Gross per 24 hour  Intake 437.69 ml  Output --  Net 437.69 ml   Weight change: 0.4 kg Exam:  General:  Pt is alert, follows commands appropriately, not in acute distress HEENT: No icterus, No thrush, No neck mass, [place]/AT Cardiovascular: RRR, S1/S2, no rubs, no gallops Respiratory: bibasilar crackles.  No wheeze Abdomen: Soft/+BS, non tender, non distended, no guarding Extremities: No edema, No lymphangitis, No petechiae, No rashes, no synovitis   Data Reviewed: I have personally reviewed following labs and imaging studies Basic Metabolic Panel: Recent Labs  Lab 08/15/23 0101 08/16/23 0445 08/16/23 1844 08/17/23 0438  NA 133* 130*  --  130*  K 4.7 6.1* 4.7 3.9  CL 100 99  --  92*  CO2 26 23  --  25  GLUCOSE 81 128*  --  329*  BUN 39* 53*  --  67*  CREATININE 1.23* 1.93*  --  2.19*  CALCIUM  8.9 8.6*  --  7.9*  MG 2.2  --   --  2.3   Liver Function Tests: Recent Labs  Lab 08/15/23 0101 08/16/23 0445 08/17/23 0438  AST 936* 1,311* 1,650*  ALT 346* 479* 583*  ALKPHOS 112 107 94  BILITOT 1.8* 2.4* 2.3*  PROT 10.8* >12.0* 10.1*  ALBUMIN  2.3* 2.4* 2.1*   No results for input(s): "LIPASE", "AMYLASE" in the last 168 hours. Recent Labs  Lab 08/16/23 0906  AMMONIA 50*   Coagulation Profile: Recent Labs  Lab 08/16/23 0801 08/17/23 0852  INR 2.3* 2.6*   CBC: Recent Labs  Lab 08/14/23 2341 08/16/23 0445 08/17/23 0438  WBC 2.9* 3.6* 4.8  NEUTROABS 1.8  --   --   HGB 13.8 14.2 10.7*  HCT 43.9 45.1 33.5*  MCV 89.4 89.7 88.9  PLT 122* 81* 65*   Cardiac Enzymes: No results for input(s): "CKTOTAL", "CKMB", "CKMBINDEX", "TROPONINI" in the last 168 hours. BNP: Invalid input(s): "POCBNP" CBG: Recent  Labs  Lab 08/16/23 1656 08/16/23 2026 08/16/23 2254 08/17/23 0419 08/17/23 0744  GLUCAP 130* 120* 119* 107* 100*   HbA1C: No results for input(s): "HGBA1C" in the last 72 hours. Urine analysis:    Component Value Date/Time   COLORURINE YELLOW 08/14/2023 2355   APPEARANCEUR HAZY (A) 08/14/2023 2355   LABSPEC 1.011 08/14/2023 2355   PHURINE 5.0 08/14/2023 2355   GLUCOSEU NEGATIVE 08/14/2023 2355   HGBUR MODERATE (A) 08/14/2023 2355   BILIRUBINUR NEGATIVE 08/14/2023 2355   KETONESUR NEGATIVE 08/14/2023 2355   PROTEINUR 100 (A) 08/14/2023 2355   NITRITE POSITIVE (A) 08/14/2023 2355   LEUKOCYTESUR MODERATE (A) 08/14/2023 2355   Sepsis Labs: @LABRCNTIP (procalcitonin:4,lacticidven:4) ) Recent Results (from the past 240 hours)  Urine Culture     Status: Abnormal   Collection Time: 08/14/23 11:55 PM   Specimen: Urine, Clean Catch  Result Value Ref Range Status   Specimen Description   Final    URINE, CLEAN CATCH Performed at [facility], [address] [place], [place] [address]    Special Requests  Final    NONE Performed at [facility], [address], [place], [place] [address]    Culture >=100,000 COLONIES/mL ESCHERICHIA COLI (A)  Final   Report Status 08/17/2023 FINAL  Final   Organism ID, Bacteria ESCHERICHIA COLI (A)  Final      Susceptibility   Escherichia coli - MIC*    AMPICILLIN <=2 SENSITIVE Sensitive     CEFAZOLIN  <=4 SENSITIVE Sensitive     CEFEPIME <=0.12 SENSITIVE Sensitive     CEFTRIAXONE  <=0.25 SENSITIVE Sensitive     CIPROFLOXACIN <=0.25 SENSITIVE Sensitive     GENTAMICIN <=1 SENSITIVE Sensitive     IMIPENEM <=0.25 SENSITIVE Sensitive     NITROFURANTOIN <=16 SENSITIVE Sensitive     TRIMETH/SULFA <=20 SENSITIVE Sensitive     AMPICILLIN/SULBACTAM <=2 SENSITIVE Sensitive     PIP/TAZO <=4 SENSITIVE Sensitive ug/mL    * >=100,000 COLONIES/mL ESCHERICHIA COLI  Resp panel by RT-PCR (RSV, Flu A&B, Covid) Anterior Nasal Swab     Status: None   Collection  Time: 08/15/23 12:09 AM   Specimen: Anterior Nasal Swab  Result Value Ref Range Status   SARS Coronavirus 2 by RT PCR NEGATIVE NEGATIVE Final    Comment: (NOTE) SARS-CoV-2 target nucleic acids are NOT DETECTED.  The SARS-CoV-2 RNA is generally detectable in upper respiratory specimens during the acute phase of infection. The lowest concentration of SARS-CoV-2 viral copies this assay can detect is 138 copies/mL. A negative result does not preclude SARS-Cov-2 infection and should not be used as the sole basis for treatment or other patient management decisions. A negative result may occur with  improper specimen collection/handling, submission of specimen other than nasopharyngeal swab, presence of viral mutation(s) within the areas targeted by this assay, and inadequate number of viral copies(<138 copies/mL). A negative result must be combined with clinical observations, patient history, and epidemiological information. The expected result is Negative.  Fact Sheet for Patients:  BloggerCourse.com  Fact Sheet for Healthcare Providers:  SeriousBroker.it  This test is no t yet approved or cleared by the [place]  FDA and  has been authorized for detection and/or diagnosis of SARS-CoV-2 by FDA under an Emergency Use Authorization (EUA). This EUA will remain  in effect (meaning this test can be used) for the duration of the COVID-19 declaration under Section 564(b)(1) of the Act, 21 U.S.C.section 360bbb-3(b)(1), unless the authorization is terminated  or revoked sooner.       Influenza A by PCR NEGATIVE NEGATIVE Final   Influenza B by PCR NEGATIVE NEGATIVE Final    Comment: (NOTE) The Xpert Xpress SARS-CoV-2/FLU/RSV plus assay is intended as an aid in the diagnosis of influenza from Nasopharyngeal swab specimens and should not be used as a sole basis for treatment. Nasal washings and aspirates are unacceptable for Xpert Xpress  SARS-CoV-2/FLU/RSV testing.  Fact Sheet for Patients: BloggerCourse.com  Fact Sheet for Healthcare Providers: SeriousBroker.it  This test is not yet approved or cleared by the [place]  FDA and has been authorized for detection and/or diagnosis of SARS-CoV-2 by FDA under an Emergency Use Authorization (EUA). This EUA will remain in effect (meaning this test can be used) for the duration of the COVID-19 declaration under Section 564(b)(1) of the Act, 21 U.S.C. section 360bbb-3(b)(1), unless the authorization is terminated or revoked.     Resp Syncytial Virus by PCR NEGATIVE NEGATIVE Final    Comment: (NOTE) Fact Sheet for Patients: BloggerCourse.com  Fact Sheet for Healthcare Providers: SeriousBroker.it  This test is not yet approved or cleared by  the [place]  FDA and has been authorized for detection and/or diagnosis of SARS-CoV-2 by FDA under an Emergency Use Authorization (EUA). This EUA will remain in effect (meaning this test can be used) for the duration of the COVID-19 declaration under Section 564(b)(1) of the Act, 21 U.S.C. section 360bbb-3(b)(1), unless the authorization is terminated or revoked.  Performed at [facility], [address], [place], [place] [address]   MRSA Next Gen by PCR, Nasal     Status: None   Collection Time: 08/15/23  5:09 PM   Specimen: Nasal Mucosa; Nasal Swab  Result Value Ref Range Status   MRSA by PCR Next Gen NOT DETECTED NOT DETECTED Final    Comment: (NOTE) The GeneXpert MRSA Assay (FDA approved for NASAL specimens only), is one component of a comprehensive MRSA colonization surveillance program. It is not intended to diagnose MRSA infection nor to guide or monitor treatment for MRSA infections. Test performance is not FDA approved in patients less than 45 years old. Performed at [facility], [address] [place],  [place] [address]   Culture, blood (Routine X 2) w Reflex to ID Panel     Status: None (Preliminary result)   Collection Time: 08/16/23  6:44 PM   Specimen: BLOOD  Result Value Ref Range Status   Specimen Description BLOOD BLOOD RIGHT HAND  Final   Special Requests   Final    BOTTLES DRAWN AEROBIC ONLY Blood Culture results may not be optimal due to an inadequate volume of blood received in culture bottles   Culture   Final    NO GROWTH < 12 HOURS Performed at [facility], [address], [place], [place] [address]    Report Status PENDING  Incomplete  Culture, blood (Routine X 2) w Reflex to ID Panel     Status: None (Preliminary result)   Collection Time: 08/16/23  6:44 PM   Specimen: BLOOD  Result Value Ref Range Status   Specimen Description BLOOD BLOOD LEFT HAND  Final   Special Requests   Final    BOTTLES DRAWN AEROBIC ONLY Blood Culture results may not be optimal due to an inadequate volume of blood received in culture bottles   Culture   Final    NO GROWTH < 12 HOURS Performed at [facility], [address], [place], [place] [address]    Report Status PENDING  Incomplete     Scheduled Meds:  aspirin   300 mg Rectal Daily   budesonide (PULMICORT) nebulizer solution  0.5 mg Nebulization BID   Chlorhexidine  Gluconate Cloth  6 each Topical Q0600   levothyroxine   50 mcg Oral QAC breakfast   midodrine   5 mg Oral TID WC   pantoprazole (PROTONIX) IV  40 mg Intravenous Q24H   sodium chloride  flush  10-40 mL Intracatheter Q12H   sodium chloride  flush  3 mL Intravenous Q12H   Continuous Infusions:  amiodarone 30 mg/hr (08/17/23 0317)   meropenem (MERREM) IV 1 g (08/17/23 0815)   milrinone 0.25 mcg/kg/min (08/17/23 0439)   norepinephrine (LEVOPHED) Adult infusion      Procedures/Studies: DG CHEST PORT 1 VIEW Result Date: 08/16/2023 CLINICAL DATA:  Cough with weakness and shortness of breath. EXAM: PORTABLE CHEST 1 VIEW COMPARISON:  08/15/2023. FINDINGS: Leftward patient rotation. The  cardio pericardial silhouette is enlarged. Possible retrocardiac atelectasis or infiltrate. Right lung clear. No acute bony abnormality. Telemetry leads overlie the chest. IMPRESSION: Rotated film with cardiomegaly. Possible retrocardiac left base atelectasis or infiltrate. Electronically Signed   By:  Donnal Fusi M.D.   On: 08/16/2023 06:39   US  EKG SITE RITE Result Date: 08/15/2023 If Site Rite image not attached, placement could not be confirmed due to current cardiac rhythm.  DG Chest Port 1 View Result Date: 08/15/2023 CLINICAL DATA:  Shortness of breath with loss of appetite and diarrhea. EXAM: PORTABLE CHEST 1 VIEW COMPARISON:  July 14, 2023 FINDINGS: The cardiac silhouette is mildly enlarged and unchanged in size. There is no evidence of acute infiltrate, pleural effusion or pneumothorax. The visualized skeletal structures are unremarkable. IMPRESSION: No active cardiopulmonary disease. Electronically Signed   By: Virgle Grime M.D.   On: 08/15/2023 00:30    Demaris Fillers, DO  [facility]  If 7PM-7AM, please contact night-coverage [email or web address] Password [email or web address] 08/17/2023, 9:58 AM   LOS: 2 days

## 2023-08-17 NOTE — Progress Notes (Signed)
 Palliative:  [name] is lying quietly in bed.  She appears acutely/chronically ill and frail.  She will briefly open her eyes when I shake her and call her name.  She is able to mumble something when I ask her name but quickly closes her eyes.  I do not believe that she can make her basic needs known.  Her spouse since 2019, [name], is present at bedside.  Bedside nursing staff is also present attending to needs.  [name] and I talked about [name] acute health concerns and the treatment plan.  We talked about the medications being used to help strengthen her heart.  [name] states that he has concerns about her kidney function also.  We talked about cardiogenic shock and cardiorenal syndrome.  We talked about CODE STATUS, the concept of "treat the treatable, but allow a natural passing".  I encouraged [name] to talk with [name]/HCPOA and to consider what [name] would and would not want, what is meaningful to her.  He is somewhat tearful during our conversation.  Conference with attending, cardiology, bedside nursing staff, transition of care team related to patient condition, needs, goals of care.  Plan: At this point continue full scope/full code.  No limits set on life support.  Time for outcomes. HCPOA: Daughter, [name].  50 minutes  Arla Lab, NP Palliative Medicine Team  Team Phone [phone]

## 2023-08-17 NOTE — Telephone Encounter (Signed)
 Pts daughter called requesting if we could send lab orders to the hospital where the pt is currently. Pts daughter [name]) is requesting a call back to confirm those orders were sent.

## 2023-08-17 NOTE — Telephone Encounter (Signed)
 Contacted the patient's daughter, [name], and advised we have CBC and CMP labs up to date and the patient's TB Gold test is not due until August. Patient inquired if the patient needed any other labs before her appointment in June. The patient is currently at [facility] and is due to be transferred to [facility]. The patient currently has a picc line now. Please advise if the patient needs any other labs.

## 2023-08-17 NOTE — Progress Notes (Signed)
 Hypoglycemic Event  CBG: 59  Treatment: D50 25 mL (12.5 gm)  Symptoms: None  Follow-up CBG: Time:1616 CBG Result:75  Possible Reasons for Event: Inadequate meal intake  Comments/MD notified: dalton at bedside    Shacara Cozine A Rayon Mcchristian

## 2023-08-18 DIAGNOSIS — I5033 Acute on chronic diastolic (congestive) heart failure: Secondary | ICD-10-CM | POA: Diagnosis not present

## 2023-08-18 DIAGNOSIS — N179 Acute kidney failure, unspecified: Secondary | ICD-10-CM

## 2023-08-18 DIAGNOSIS — I10 Essential (primary) hypertension: Secondary | ICD-10-CM | POA: Diagnosis not present

## 2023-08-18 DIAGNOSIS — I639 Cerebral infarction, unspecified: Secondary | ICD-10-CM | POA: Diagnosis not present

## 2023-08-18 DIAGNOSIS — I4892 Unspecified atrial flutter: Secondary | ICD-10-CM | POA: Insufficient documentation

## 2023-08-18 DIAGNOSIS — G9341 Metabolic encephalopathy: Secondary | ICD-10-CM | POA: Diagnosis not present

## 2023-08-18 LAB — COMPREHENSIVE METABOLIC PANEL WITH GFR
ALT: 532 U/L — ABNORMAL HIGH (ref 0–44)
ALT: 513 U/L — ABNORMAL HIGH (ref 0–44)
AST: 1343 U/L — ABNORMAL HIGH (ref 15–41)
AST: 1283 U/L — ABNORMAL HIGH (ref 15–41)
Albumin: 1.9 g/dL — ABNORMAL LOW (ref 3.5–5.0)
Albumin: 2 g/dL — ABNORMAL LOW (ref 3.5–5.0)
Alkaline Phosphatase: 100 U/L (ref 38–126)
Alkaline Phosphatase: 98 U/L (ref 38–126)
Anion gap: 11 (ref 5–15)
Anion gap: 7 (ref 5–15)
BUN: 70 mg/dL — ABNORMAL HIGH (ref 6–20)
BUN: 70 mg/dL — ABNORMAL HIGH (ref 6–20)
CO2: 26 mmol/L (ref 22–32)
CO2: 27 mmol/L (ref 22–32)
Calcium: 8.4 mg/dL — ABNORMAL LOW (ref 8.9–10.3)
Calcium: 8.4 mg/dL — ABNORMAL LOW (ref 8.9–10.3)
Chloride: 96 mmol/L — ABNORMAL LOW (ref 98–111)
Chloride: 99 mmol/L (ref 98–111)
Creatinine, Ser: 2.05 mg/dL — ABNORMAL HIGH (ref 0.44–1.00)
Creatinine, Ser: 2.15 mg/dL — ABNORMAL HIGH (ref 0.44–1.00)
GFR, Estimated: 28 mL/min — ABNORMAL LOW (ref >60)
GFR, Estimated: 26 mL/min — ABNORMAL LOW
Glucose, Bld: 105 mg/dL — ABNORMAL HIGH (ref 70–99)
Glucose, Bld: 117 mg/dL — ABNORMAL HIGH (ref 70–99)
Potassium: 3.5 mmol/L (ref 3.5–5.1)
Potassium: 3.7 mmol/L (ref 3.5–5.1)
Sodium: 133 mmol/L — ABNORMAL LOW (ref 135–145)
Sodium: 133 mmol/L — ABNORMAL LOW (ref 135–145)
Total Bilirubin: 1.8 mg/dL — ABNORMAL HIGH (ref 0.0–1.2)
Total Bilirubin: 1.7 mg/dL — ABNORMAL HIGH (ref 0.0–1.2)
Total Protein: 10.1 g/dL — ABNORMAL HIGH (ref 6.5–8.1)
Total Protein: 9.9 g/dL — ABNORMAL HIGH (ref 6.5–8.1)

## 2023-08-18 LAB — COOXEMETRY PANEL
Carboxyhemoglobin: 2.3 % — ABNORMAL HIGH (ref 0.5–1.5)
Carboxyhemoglobin: 1.8 % — ABNORMAL HIGH (ref 0.5–1.5)
Methemoglobin: <0.7 % (ref 0.0–1.5)
Methemoglobin: <0.7 % (ref 0.0–1.5)
O2 Saturation: 74 %
O2 Saturation: 58.9 %
Total hemoglobin: 12 g/dL (ref 12.0–16.0)
Total hemoglobin: 12 g/dL (ref 12.0–16.0)

## 2023-08-18 LAB — CBC
HCT: 34.9 % — ABNORMAL LOW (ref 36.0–46.0)
HCT: 35.6 % — ABNORMAL LOW (ref 36.0–46.0)
Hemoglobin: 11.2 g/dL — ABNORMAL LOW (ref 12.0–15.0)
Hemoglobin: 11.5 g/dL — ABNORMAL LOW (ref 12.0–15.0)
MCH: 28 pg (ref 26.0–34.0)
MCH: 27.8 pg (ref 26.0–34.0)
MCHC: 32.1 g/dL (ref 30.0–36.0)
MCHC: 32.3 g/dL (ref 30.0–36.0)
MCV: 87.3 fL (ref 80.0–100.0)
MCV: 86.2 fL (ref 80.0–100.0)
Platelets: UNDETERMINED 10*3/uL (ref 150–400)
Platelets: 73 10*3/uL — ABNORMAL LOW (ref 150–400)
RBC: 4 MIL/uL (ref 3.87–5.11)
RBC: 4.13 MIL/uL (ref 3.87–5.11)
RDW: 17.9 % — ABNORMAL HIGH (ref 11.5–15.5)
RDW: 18 % — ABNORMAL HIGH (ref 11.5–15.5)
WBC: 5.8 10*3/uL (ref 4.0–10.5)
WBC: 5.7 10*3/uL (ref 4.0–10.5)
nRBC: 2.6 % — ABNORMAL HIGH (ref 0.0–0.2)
nRBC: 2.3 % — ABNORMAL HIGH (ref 0.0–0.2)

## 2023-08-18 LAB — GLUCOSE, CAPILLARY
Glucose-Capillary: 116 mg/dL — ABNORMAL HIGH (ref 70–99)
Glucose-Capillary: 91 mg/dL (ref 70–99)
Glucose-Capillary: 88 mg/dL (ref 70–99)
Glucose-Capillary: 98 mg/dL (ref 70–99)
Glucose-Capillary: 141 mg/dL — ABNORMAL HIGH (ref 70–99)

## 2023-08-18 LAB — PROTIME-INR
INR: 1.8 — ABNORMAL HIGH (ref 0.8–1.2)
Prothrombin Time: 21.1 s — ABNORMAL HIGH (ref 11.4–15.2)

## 2023-08-18 MED ORDER — GERHARDT'S BUTT CREAM
TOPICAL_CREAM | Freq: Two times a day (BID) | CUTANEOUS | Status: DC
  Administered 2023-08-18 – 2023-08-23 (×4): 1 via TOPICAL
  Filled 2023-08-18 (×2): qty 60

## 2023-08-18 MED ORDER — LACTULOSE 10 GM/15ML PO SOLN
30.0000 g | Freq: Two times a day (BID) | ORAL | Status: DC
  Administered 2023-08-18 – 2023-08-26 (×14): 30 g via ORAL
  Filled 2023-08-18 (×17): qty 45

## 2023-08-18 MED ORDER — POTASSIUM CHLORIDE CRYS ER 20 MEQ PO TBCR
40.0000 meq | EXTENDED_RELEASE_TABLET | Freq: Once | ORAL | Status: AC
  Administered 2023-08-18: 40 meq via ORAL
  Filled 2023-08-18: qty 2

## 2023-08-18 MED ORDER — ASPIRIN 81 MG PO TBEC
81.0000 mg | DELAYED_RELEASE_TABLET | Freq: Every day | ORAL | Status: DC
  Administered 2023-08-18 – 2023-08-26 (×8): 81 mg via ORAL
  Filled 2023-08-18 (×9): qty 1

## 2023-08-18 MED ORDER — FUROSEMIDE 10 MG/ML IJ SOLN
80.0000 mg | Freq: Once | INTRAMUSCULAR | Status: AC
  Administered 2023-08-18: 80 mg via INTRAVENOUS
  Filled 2023-08-18: qty 8

## 2023-08-18 NOTE — Plan of Care (Signed)

## 2023-08-18 NOTE — Assessment & Plan Note (Addendum)
 Echocardiogram with reduced mild  LV systolic function EF 40 to 45%, interventricular septum is flattened in systole and diastole, RV systolic function with severe reduction, RV with severe enlargement, TR mild to moderate, RVSP 46.9 mmHg,   Pulmonary hypertension  RV failure Acute on chronic core pulmonale.  Cardiogenic shock   No Documented urine output  Systolic blood pressure 95 to 295 mmHg range  SV02 82.4   Milrinone  for inotropic support 0.25 mcg/kg/min Midodrine  for blood pressure support 5, mg tid  05/17 furosemide  80 mg x1   Congested hepatopathy, ALT and AST are trending down to 189 and 24 respectively.   Very poor prognosis, end stage heart failure. Limited therapy due to hypotension

## 2023-08-18 NOTE — Assessment & Plan Note (Signed)
 Continue amiodarone for rate control  Holding anticoagulation for now Positive thrombocytopenia

## 2023-08-18 NOTE — Assessment & Plan Note (Addendum)
 CKD stage 3a, hyperkalemia/ hypokalemia  hyponatremia.   Renal function with serum cr at 0,98 with K at 3,5 and serum bicarbonate at 28  Na 136   Continue milrinone  for inotropic support.  Add 40 meq today to aim for K of 4

## 2023-08-18 NOTE — TOC Initial Note (Signed)
 Transition of Care [facility]) - Initial/Assessment Note    Patient Details  Name: [name] MRN: [id number] Date of Birth: [birth year]  Transition [facility] CM/SW Contact:    Ernst Heap Phone Number: [phone] 08/18/2023, 1:50 PM  Clinical Narrative:  HF CSW met with patient and husband at bedside. Patient stated the he and his spouse live alone. Patient stated that the patient has past history of HH services, but could not remember the agency name. Patients husband stated that services are not current. Patient stated that she does not use any equipment, and unsure about at home oxygen. Patients husband stated that she has a PCP. CSW explained that a hospital follow up appointment will be scheduled closer towards dc.    TOC will continue following.                  Expected Discharge Plan: Home/Self Care Barriers to Discharge: Continued Medical Work up   Patient Goals and CMS Choice Patient states their goals for this hospitalization and ongoing recovery are:: return home CMS Medicare.gov Compare Post Acute Care list provided to:: Patient Choice offered to / list presented to : Patient      Expected Discharge Plan and Services In-house Referral: Clinical Social Work Discharge Planning Services: CM Consult   Living arrangements for the past 2 months: Single Family Home                                      Prior Living Arrangements/Services Living arrangements for the past 2 months: Single Family Home Lives with:: Spouse, Minor Children Patient language and need for interpreter reviewed:: Yes Do you feel safe going back to the place where you live?: Yes      Need for Family Participation in Patient Care: Yes (Comment) Care giver support system in place?: Yes (comment) Current home services: DME Criminal Activity/Legal Involvement Pertinent to Current Situation/Hospitalization: No - Comment as needed  Activities of Daily Living   ADL Screening  (condition at time of admission) Independently performs ADLs?: No Does the patient have a NEW difficulty with bathing/dressing/toileting/self-feeding that is expected to last >3 days?: Yes (Initiates electronic notice to provider for possible OT consult) Does the patient have a NEW difficulty with getting in/out of bed, walking, or climbing stairs that is expected to last >3 days?: Yes (Initiates electronic notice to provider for possible PT consult) Does the patient have a NEW difficulty with communication that is expected to last >3 days?: No Is the patient deaf or have difficulty hearing?: No Does the patient have difficulty seeing, even when wearing glasses/contacts?: No Does the patient have difficulty concentrating, remembering, or making decisions?: No  Permission Sought/Granted                  Emotional Assessment Appearance:: Appears stated age Attitude/Demeanor/Rapport: Engaged Affect (typically observed): Accepting Orientation: : Oriented to Self, Oriented to Place, Oriented to  Time, Oriented to Situation Alcohol / Substance Use: Not Applicable Psych Involvement: No (comment)  Admission diagnosis:  Acute exacerbation of CHF (congestive heart failure) (HCC) [I50.9] Cystitis [N30.90] Acute on chronic congestive heart failure, unspecified heart failure type [facility]) [I50.9] Patient Active Problem List   Diagnosis Date Noted   Acute metabolic encephalopathy 08/16/2023   Acute on chronic systolic right heart failure (HCC) 08/16/2023   Cardiogenic shock (HCC) 08/16/2023   Acute on chronic heart failure with preserved ejection  fraction (HFpEF) (HCC) 08/15/2023   UTI (urinary tract infection) 08/15/2023   Reactive airway disease 08/15/2023   Pulmonary hypertension, unspecified (HCC) 07/14/2023   Shortness of breath 07/11/2023   Pressure injury of skin 07/11/2023   Elevated brain natriuretic peptide (BNP) level 07/10/2023   Elevated d-dimer 07/10/2023   Elevated troponin  07/10/2023   Leukopenia 07/10/2023   Prolonged QT interval 07/10/2023   Hypoalbuminemia due to protein-calorie malnutrition (HCC) 07/10/2023   Transaminitis 07/10/2023   Class II obesity 07/10/2023   Acute CHF (congestive heart failure) (HCC) 07/09/2023   Acute respiratory failure with hypoxia (HCC) 01/15/2022   Hypokalemia 01/15/2022   Hyponatremia 01/15/2022   CAP (community acquired pneumonia) 01/14/2022   Sepsis (HCC) 01/14/2022   Pain in right knee 12/14/2021   Mild intermittent asthma without complication 07/07/2021   Normocytic anemia 12/02/2020   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/19/2020   High risk medication use 06/19/2020   Vitamin D  deficiency 06/19/2020   Arthritis involving multiple sites 02/18/2018   Mixed hyperlipidemia 02/16/2018   Class 3 severe obesity due to excess calories with serious comorbidity and body mass index (BMI) of 45.0 to 49.9 in adult 02/16/2018   Hemiparesis and alteration of sensations as late effects of stroke (HCC) 02/16/2018   Anogenital (venereal) warts 08/04/2017   Screening for colorectal cancer 08/04/2017   Encounter for gynecological examination with Papanicolaou smear of cervix 08/04/2017   Vertigo 08/04/2017   Vertigo as late effect of stroke 08/04/2017   Tobacco abuse 06/01/2017   Acquired hypothyroidism 05/12/2017   CVA (cerebral vascular accident) (HCC) 06/25/2013   Tremor of left hand 06/25/2013   Hemiparesis, left (HCC) 06/25/2013   History of CVA with residual deficit 06/25/2013   Difficulty walking 08/31/2011   Unstable balance 08/31/2011   Weakness of left side of body 08/31/2011   Difficulty walking 08/31/2011   Cerebral infarction (HCC) 04/07/2011   Left hand weakness 04/07/2011   Essential hypertension 04/07/2011   PCP:  [facility]:   [facility] [address] [place] [address] [place] [address] [place] [address] [place] [address] Phone: [phone] Fax: [phone]  [facility] [address] [place] [address] Phone: [phone] Fax: [phone]     Social Drivers of Health (SDOH) Social History: SDOH Screenings   Food Insecurity: No Food Insecurity (08/15/2023)  Housing: Low Risk  (08/15/2023)  Transportation Needs: No Transportation Needs (08/15/2023)  Utilities: Not At Risk (08/15/2023)  Alcohol Screen: Low Risk  (08/31/2021)  Depression (PHQ2-9): Low Risk  (08/31/2021)  Financial Resource Strain: Low Risk  (04/07/2023)   Received from [facility]  Physical Activity: Sufficiently Active (08/31/2021)  Recent Concern: Physical Activity - Inactive (07/07/2021)   Received from [facility], [facility]  Social Connections: Unknown (07/08/2022)   Received from [facility], [facility]  Stress: No Stress Concern Present (08/31/2021)  Tobacco Use: High Risk (08/15/2023)   SDOH Interventions:     Readmission Risk Interventions    08/15/2023    1:45 PM  Readmission Risk Prevention Plan  Transportation Screening Complete  Home Care Screening Complete  Medication Review (RN CM) Complete

## 2023-08-18 NOTE — Telephone Encounter (Signed)
 She does not need any particular labs before our next visit.

## 2023-08-18 NOTE — Assessment & Plan Note (Addendum)
 Multifactorial, related to cardiogenic shock Clinically has resolved, likely patient is back to her baseline.  Cognitive impairment due to history of CVA

## 2023-08-18 NOTE — Progress Notes (Signed)
   08/18/23 1205  Spiritual Encounters  Type of Visit Initial  Care provided to: Pt and family  Referral source Family  Reason for visit Urgent spiritual support  OnCall Visit No   Patient's spouse visited chaplain office requesting conversation. Accompanied spouse into chapel and provided spiritual care and prayer. Spouse asked that his wife also be visited and that they both be anointed. Visited patient and provided prayer. Anointed both patient and spouse.

## 2023-08-18 NOTE — Assessment & Plan Note (Addendum)
 Continue blood pressure control, on aspirin , no statin  Stage 2 pressure ulcer on sacrum present on admission.  Local wound care and air mattress.

## 2023-08-18 NOTE — Progress Notes (Addendum)
 Progress Note   Patient: [name]:[id number] DOB: 1965/10/22 DOA: 08/14/2023     3 DOS: the patient was seen and examined on 08/18/2023   Brief hospital course:  58 y.o. female with medical history significant of tobacco abuse, hypertension, hyperlipidemia, hypothyroidism and, history of stroke with left-sided residual deficits and dysarthria, pulmonary hypertension and right-sided heart failure; who presented to the hospital secondary to shortness of breath and weakness.  Patient reports symptom has been present for the last week or so and worsening.  Currently with difficulty laying down flat as she feels gasping for air and also noticing dyspnea on exertion.  Patient expressed increased swelling in her legs and some difficulty speaking in full sentences. Patient's husband at bedside at information of increased frequency and further deconditioning/generalized weakness.  No descriptions of focal deficits, sick contacts, fever, chest pain, hematuria, melena, hematochezia or any other complaints.   Workup in the ED demonstrating elevated BNP, vascular congestion on chest x-ray and positive urinalysis for UTI.  Physical examination revealed tachypnea, decreased breath sounds at the bases and expiratory wheezing.   Steroids, nebulizer treatment, IV Lasix  and antibiotics after cultures taken has been provided; TRH consulted for further evaluation and management.  Assessment and Plan: * Acute on chronic diastolic CHF (congestive heart failure) (HCC) Echocardiogram with reduced mild  LV systolic function EF 40 to 45%, interventricular septum is flattened in systole and diastole, RV systolic function with severe reduction, RV with severe enlargement, TR mild to moderate, RVSP 46.9 mmHg,   Pulmonary hypertension  RV failure Acute on chronic core pulmonale.  Cardiogenic shock   Documented urine output is 300 cc  Systolic blood pressure 100 mmHg range  SV02 58.9   Continue milrinone for  inotropic support, and midodrine  for blood pressure support.  One dose of furosemide  80 mg x1   Acute metabolic encephalopathy Multifactorial, related to cardiogenic shock Continue supportive medical therapy, avoid sedatives   Essential hypertension Continue blood pressure support with midodrine    CVA (cerebral vascular accident) (HCC) Continue blood pressure control, on aspirin , no statin   Acquired hypothyroidism Continue levothyroxine    Class II obesity Calculated BMI is 34.3   Mixed hyperlipidemia Continue statin therapy   UTI (urinary tract infection) Urine culture positive for  E Coli,  Continue antibiotic therapy with cefazolin  IV  Follow up sensitivities.  No sepsis   AKI (acute kidney injury) (HCC) CKD stage 3a, hyperkalemia/ hypokalemia    Renal function with serum cr at 2,15 with K at 3,7 and serum bicarbonate at 27 Na 133   Plan continue furosemide  IV x 1 80 mg K correction with kcl Continue milrinone for inotropic support.  Follow up renal function and electrolytes in am. Avoid hypotension and nephrotoxic medications   Atrial flutter (HCC) Continue amiodarone for rate control  Holding anticoagulation for now Positive thrombocytopenia      Subjective: Patient very weak and deconditioned, minimally interactive, very deconditioned   Physical Exam: Vitals:   08/18/23 0529 08/18/23 0757 08/18/23 0836 08/18/23 1131  BP: 110/62 (!) 105/58  110/68  Pulse: 86 87 85 86  Resp: 20 17 17 17   Temp: 97.7 F (36.5 C) 97.7 F (36.5 C)  97.7 F (36.5 C)  TempSrc: Oral Oral  Oral  SpO2: 95% 95% 97%   Weight: 96.4 kg     Height:       Neurology somnolent and poorly interactive, responds to simple questions and follows simple commands ENT with pallor with no icterus Cardiovascular with S1  and S2 present and regular with no gallops, rubs, systolic murmur at the right lower sternal border, prominent S2  Respiratory with rales at dependent zones with no  wheezing  Abdomen with no distention  Positive lower extremity edema  Data Reviewed:   Family Communication: .I spoke with patient's husband at the bedside, we talked in detail about patient's condition, plan of care and prognosis and all questions were addressed.   Disposition: Status is: Inpatient Remains inpatient appropriate because: end stage heart failure   Planned Discharge Destination: Home     Author: Albertus Alt, MD 08/18/2023 2:48 PM  For on call review [email or web address].

## 2023-08-18 NOTE — Assessment & Plan Note (Signed)
 Calculated BMI is 34.3

## 2023-08-18 NOTE — Progress Notes (Addendum)
 Advanced Heart Failure Rounding Note  Cardiologist: Ola Berger, MD   Chief Complaint: Acute on chronic RV failure/Cardiogenic shock  Subjective:    Remains on 0.25 milrinone.   CVP 5.   Awaiting AM labs.  Spouse at bedside. Patient drowsy but answer questions. Denies dyspnea. Wants to be left alone to rest.   Objective:   Weight Range: 96.4 kg Body mass index is 34.3 kg/m.   Vital Signs:   Temp:  [96.6 F (35.9 C)-97.7 F (36.5 C)] 97.7 F (36.5 C) (05/16 0529) Pulse Rate:  [86-117] 86 (05/16 0529) Resp:  [16-25] 20 (05/16 0529) BP: (91-121)/(62-86) 110/62 (05/16 0529) SpO2:  [95 %-100 %] 95 % (05/16 0529) Weight:  [96.4 kg] 96.4 kg (05/16 0529) Last BM Date : 08/17/23  Weight change: Filed Weights   08/16/23 0704 08/17/23 0424 08/18/23 0529  Weight: 96.1 kg 96.9 kg 96.4 kg    Intake/Output:   Intake/Output Summary (Last 24 hours) at 08/18/2023 0720 Last data filed at 08/18/2023 0535 Gross per 24 hour  Intake 364.18 ml  Output 300 ml  Net 64.18 ml      Physical Exam    General:  Chronically ill appearing HEENT:Poor dentition Neck: No JVD.  Cor: Regular rate & rhythm. No rubs, gallops or murmurs. Lungs: Breathing nonlabored Abdomen: Soft, nontender, nondistended.  Extremities: 1+ edema Neuro: Lethargic but easily aroused.   Telemetry   SR 80s   Labs    CBC Recent Labs    08/16/23 0445 08/17/23 0438  WBC 3.6* 4.8  HGB 14.2 10.7*  HCT 45.1 33.5*  MCV 89.7 88.9  PLT 81* 65*   Basic Metabolic Panel Recent Labs    16/10/96 0445 08/16/23 1844 08/17/23 0438  NA 130*  --  130*  K 6.1* 4.7 3.9  CL 99  --  92*  CO2 23  --  25  GLUCOSE 128*  --  329*  BUN 53*  --  67*  CREATININE 1.93*  --  2.19*  CALCIUM  8.6*  --  7.9*  MG  --   --  2.3   Liver Function Tests Recent Labs    08/16/23 0445 08/17/23 0438  AST 1,311* 1,650*  ALT 479* 583*  ALKPHOS 107 94  BILITOT 2.4* 2.3*  PROT >12.0* 10.1*  ALBUMIN  2.4* 2.1*   No  results for input(s): "LIPASE", "AMYLASE" in the last 72 hours. Cardiac Enzymes No results for input(s): "CKTOTAL", "CKMB", "CKMBINDEX", "TROPONINI" in the last 72 hours.  BNP: BNP (last 3 results) Recent Labs    07/09/23 1320 08/14/23 2341  BNP 566.0* 1,054.0*    ProBNP (last 3 results) No results for input(s): "PROBNP" in the last 8760 hours.   D-Dimer No results for input(s): "DDIMER" in the last 72 hours. Hemoglobin A1C No results for input(s): "HGBA1C" in the last 72 hours. Fasting Lipid Panel No results for input(s): "CHOL", "HDL", "LDLCALC", "TRIG", "CHOLHDL", "LDLDIRECT" in the last 72 hours. Thyroid Function Tests No results for input(s): "TSH", "T4TOTAL", "T3FREE", "THYROIDAB" in the last 72 hours.  Invalid input(s): "FREET3"  Other results:   Imaging    ECHOCARDIOGRAM LIMITED Result Date: 08/17/2023    ECHOCARDIOGRAM LIMITED REPORT   Patient Name:   [name] Date of Exam: 08/17/2023 Medical Rec #:  [id number]    Height:       66.0 in Accession #:    [id number]   Weight:       213.6 lb Date of Birth:  November 04, 1965  BSA:          2.057 m Patient Age:    58 years     BP:           112/72 mmHg Patient Gender: F            HR:           83 bpm. Exam Location:  [facility] Procedure: Limited Echo, Limited Color Doppler and Cardiac Doppler (Both            Spectral and Color Flow Doppler were utilized during procedure). Indications:    Dyspnea R06.00  History:        Patient has prior history of Echocardiogram examinations, most                 recent 07/11/2023. CHF, Stroke; Risk Factors:Hypertension and                 Dyslipidemia.  Sonographer:    Denese Finn RCS Referring Phys: [id number] Joyceann No BRANCH IMPRESSIONS  1. Left ventricular ejection fraction, by estimation, is 40 to 45%. The left ventricle has low normal function. Left ventricular endocardial border not optimally defined to evaluate regional wall motion. There is the interventricular septum is flattened  in  systole and diastole, consistent with right ventricular pressure and volume overload.  2. Right ventricular systolic function is severely reduced. The right ventricular size is severely enlarged. There is moderately elevated pulmonary artery systolic pressure.  3. The tricuspid valve is abnormal. Tricuspid valve regurgitation is mild to moderate.  4. The inferior vena cava is normal in size with <50% respiratory variability, suggesting right atrial pressure of 8 mmHg.  5. Limited echo to evaluate LV and RV function FINDINGS  Left Ventricle: Left ventricular ejection fraction, by estimation, is 40 to 45%. The left ventricle has low normal function. Left ventricular endocardial border not optimally defined to evaluate regional wall motion. The interventricular septum is flattened in systole and diastole, consistent with right ventricular pressure and volume overload. Right Ventricle: The right ventricular size is severely enlarged. Right ventricular systolic function is severely reduced. There is moderately elevated pulmonary artery systolic pressure. The tricuspid regurgitant velocity is 3.12 m/s, and with an assumed right atrial pressure of 8 mmHg, the estimated right ventricular systolic pressure is 46.9 mmHg. Tricuspid Valve: The tricuspid valve is abnormal. Tricuspid valve regurgitation is mild to moderate. No evidence of tricuspid stenosis. Venous: The inferior vena cava is normal in size with less than 50% respiratory variability, suggesting right atrial pressure of 8 mmHg. LEFT VENTRICLE PLAX 2D LVIDd:         4.40 cm LVIDs:         3.50 cm LV PW:         1.20 cm LV IVS:        1.10 cm LVOT diam:     2.20 cm LVOT Area:     3.80 cm  LV Volumes (MOD) LV vol d, MOD A2C: 88.7 ml LV vol d, MOD A4C: 76.5 ml LV vol s, MOD A2C: 49.3 ml LV vol s, MOD A4C: 44.9 ml LV SV MOD A2C:     39.4 ml LV SV MOD A4C:     76.5 ml LV SV MOD BP:      37.8 ml RIGHT VENTRICLE TAPSE (M-mode): 1.5 cm LEFT ATRIUM         Index       RIGHT  ATRIUM  Index LA diam:    3.40 cm 1.65 cm/m  RA Area:     31.30 cm                                 RA Volume:   131.00 ml 63.70 ml/m   AORTA Ao Root diam: 3.30 cm TRICUSPID VALVE TR Peak grad:   38.9 mmHg TR Vmax:        312.00 cm/s  SHUNTS Systemic Diam: 2.20 cm Armida Lander MD Electronically signed by Armida Lander MD Signature Date/Time: 08/17/2023/2:28:34 PM    Final      Medications:     Scheduled Medications:  aspirin   300 mg Rectal Daily   budesonide (PULMICORT) nebulizer solution  0.5 mg Nebulization BID   Chlorhexidine  Gluconate Cloth  6 each Topical Q0600   levothyroxine   50 mcg Oral QAC breakfast   midodrine   5 mg Oral TID WC   pantoprazole (PROTONIX) IV  40 mg Intravenous Q24H   sodium chloride  flush  10-40 mL Intracatheter Q12H   sodium chloride  flush  3 mL Intravenous Q12H    Infusions:  amiodarone 30 mg/hr (08/17/23 2217)    ceFAZolin  (ANCEF ) IV 1 g (08/18/23 0554)   milrinone 0.25 mcg/kg/min (08/18/23 0620)    PRN Medications: acetaminophen  **OR** acetaminophen , ipratropium-albuterol , prochlorperazine , sodium chloride  flush, sodium chloride  flush    Patient Profile   58 y.o. with a history of CVA w/ residual left hemiparesis, HTN, HLD, anemia, seropositive rheumatoid arthritis, and hypothyroidism and recently diagnosed w/ severe RV failure and PH. Admitted for acute on chronic RV failure with Cardiogenic shock.   Assessment/Plan   1. Acute on Chronic RV Failure w/ Cardiogenic Shock / Pulmonary HTN  - End-stage RV failure - Echo 4/25 preserved LVEF, 50-55%, and severely reduced RV  - RHC 4/25: (RA 4, PA 69/29 m44, PCWP 13, PVR of 6.4 with CI of 2.4 L/min/m2). - V/Q scan negative. Pt declined PFT evaluation  - Failed Sildenafil  due to hypotension. Started on Midodrine   - now w/ cardiogenic shock w/ lactic acidosis, shock liver and AKI. LA 4.7, Co-ox 47%. Now on Milrinone 0.25 mcg/kg/min -> co-ox 61%. Lactic acid cleared. - Awaiting AM labs -  continue milrinone support. CVP 5. Will hold off on diuresis. - continue midodrine  for BP support  - she is end-stage. Has been essentially bedbound since last admission. There are no durable solutions for her.  - Can continue milrinone for now - Discussed her situation with patient's spouse at bedside again this morning. He is calling family to further discuss. She has end-stage RV failure w/ cardiogenic shock and MSOF. Overall poor prognosis. Improving with milrinone but no long-term options. Currently remains full code. Continue GOC discussions with Palliative Care.  2. Aflutter vs atrial tachycardia -Noted intermittently at [facility] on 05/14. Initially started on amiodarone gtt and later stopped d/t hypotension. Amiodarone gtt now back on at 30/hr. -She was not anticoagulated d/t INR > 2 in setting of liver disease -Currently SR -? Role of anticoagulation. Of note, she has thrombocytopenia. Platelets 122>65K this admit   3. Shock Liver - AST 1650/ALT 583 yesterday, AM labs pending - NH3 elevated at 50 - Continue inotropic support - watch for encephalopathy. Defer initiation of lactulose to IM    3. AKI on CKD IIIa - SCr 1.2>>1.9 (b/l 0.6) -> 2.2 - CG shock management per above - support BP w/ midodrine  - daily BMETs  4. Hyperkalemia - K 6.1 in setting of ARF - treated w/ Lokelma. K 3.9 yesterday   5. Seropositive Rheumatoid Arthritis  - on Humira  and methotrexate  as outpatient  - Followed by Dr. Rodell Citrin   6. Hx CVA - Weelchair bound, some aphasia - On aspirin . Hold off on restarting statin with shock liver   Length of Stay: 3  FINCH, LINDSAY N, PA-C  08/18/2023, 7:20 AM  Advanced Heart Failure Team Pager [phone] (M-F; 7a - 5p)  Please contact [facility] for night-coverage after hours (5p -7a ) and weekends on [email or web address]   Patient seen with PA, I formulated the plan and agree with the above note.   Patient will wake up for me this morning and then fall back to sleep.   Has baseline aphasia but also seems confused.   CVP 11, co-ox 59%.  She is on milrinone 0.25 for RV support. BP stable on midodrine .   She is in NSR on amiodarone gtt.   General: Drowsy Neck: JVP 10-12 cm, no thyromegaly or thyroid nodule.  Lungs: Clear to auscultation bilaterally with normal respiratory effort. CV: Nondisplaced PMI.  Heart regular S1/S2, no S3/S4, no murmur.  No peripheral edema.   Abdomen: Soft, nontender, no hepatosplenomegaly, no distention.  Skin: Intact without lesions or rashes.  Neurologic: Drowsy/confused, aphasia, left-sided weakness.   Psych: Normal affect. Extremities: No clubbing or cyanosis.  HEENT: Normal.   Suspect end-stage RV failure in setting of pulmonary arterial hypertension (?group 1 from RA). Echo this admission with LV EF 40-45%, D-shaped septum, severe RV dilation and severe RV dysfunction. Unable to tolerate sildenafil  due to hypotension and has been on midodrine .  She was admitted with cardiogenic shock at [facility], milrinone 0.25 started.  Co-ox 59% this morning with CVP 11.  - Continue milrinone 0.25 - Lasix  80 mg IV x 1 today.   Elevated LFTs, suspect shock liver in setting of low cardiac output.  LFTs starting to trend down.  NH3 was elevated and she has been confused, possible component of hepatic encephalopathy.  - Would start her on lactulose 30 g bid.   AKI on CKD 3, suspect due to low output. Creatinine stable at 2.19 today .  Question of atrial tachycardia at [facility], she is now on amiodarone gtt to maintain NSR.  I have not seen a rhythm that would merit anticoagulation yet, will use heparin  DVT prophylaxis for now.   Overall, very poor prognosis.  She has end-stage RV failure with renal failure and liver failure.  She is confused.  She is bed-bound at baseline.  I told her significant other today that I thought we should start considering comfort care as I do not think we are going to be able to turn this process around.  Palliative care consultation needed.   Peder Bourdon 08/18/2023 10:38 AM

## 2023-08-18 NOTE — Plan of Care (Signed)
 Alert and oriented x2-3.  Daughter came to bedside and requested that she be updated by MD/Care Team.  Daughter requested Gerhardt cream for incontinence and air mattress bed.  Dr. Sunnie England made aware of daughter's requests.   Problem: Education: Goal: Knowledge of General Education information will improve Description: Including pain rating scale, medication(s)/side effects and non-pharmacologic comfort measures Outcome: Progressing   Problem: Health Behavior/Discharge Planning: Goal: Ability to manage health-related needs will improve Outcome: Progressing   Problem: Clinical Measurements: Goal: Ability to maintain clinical measurements within normal limits will improve Outcome: Progressing Goal: Will remain free from infection Outcome: Progressing

## 2023-08-18 NOTE — Assessment & Plan Note (Signed)
 Continue statin therapy.

## 2023-08-18 NOTE — Progress Notes (Signed)
 Heart Failure Navigator Progress Note  Assessed for Heart & Vascular TOC clinic readiness.  Patient does not meet criteria due to Advanced Heart Failure Team patient of Dr. Gasper Lloyd.   Navigator will sign off at this time.   Rhae Hammock, BSN, Scientist, clinical (histocompatibility and immunogenetics) Only

## 2023-08-18 NOTE — Assessment & Plan Note (Addendum)
 Urine culture positive for  E Coli, sensitive to cephalosporins.  She has completed antibiotic therapy   No sepsis

## 2023-08-18 NOTE — Assessment & Plan Note (Signed)
 Continue levothyroxine 

## 2023-08-18 NOTE — Assessment & Plan Note (Signed)
 Continue blood pressure support with midodrine.

## 2023-08-19 DIAGNOSIS — I2609 Other pulmonary embolism with acute cor pulmonale: Secondary | ICD-10-CM

## 2023-08-19 DIAGNOSIS — N1831 Chronic kidney disease, stage 3a: Secondary | ICD-10-CM

## 2023-08-19 DIAGNOSIS — I4892 Unspecified atrial flutter: Secondary | ICD-10-CM | POA: Diagnosis not present

## 2023-08-19 DIAGNOSIS — I5021 Acute systolic (congestive) heart failure: Secondary | ICD-10-CM

## 2023-08-19 DIAGNOSIS — Z8673 Personal history of transient ischemic attack (TIA), and cerebral infarction without residual deficits: Secondary | ICD-10-CM

## 2023-08-19 DIAGNOSIS — G9341 Metabolic encephalopathy: Secondary | ICD-10-CM | POA: Diagnosis not present

## 2023-08-19 DIAGNOSIS — I2721 Secondary pulmonary arterial hypertension: Secondary | ICD-10-CM | POA: Diagnosis not present

## 2023-08-19 DIAGNOSIS — I5033 Acute on chronic diastolic (congestive) heart failure: Secondary | ICD-10-CM | POA: Diagnosis not present

## 2023-08-19 DIAGNOSIS — N179 Acute kidney failure, unspecified: Secondary | ICD-10-CM | POA: Diagnosis not present

## 2023-08-19 DIAGNOSIS — R570 Cardiogenic shock: Secondary | ICD-10-CM | POA: Diagnosis not present

## 2023-08-19 LAB — GLUCOSE, CAPILLARY
Glucose-Capillary: 107 mg/dL — ABNORMAL HIGH (ref 70–99)
Glucose-Capillary: 117 mg/dL — ABNORMAL HIGH (ref 70–99)
Glucose-Capillary: 223 mg/dL — ABNORMAL HIGH (ref 70–99)
Glucose-Capillary: 87 mg/dL (ref 70–99)
Glucose-Capillary: 94 mg/dL (ref 70–99)

## 2023-08-19 LAB — COMPREHENSIVE METABOLIC PANEL WITH GFR
ALT: 245 U/L — ABNORMAL HIGH (ref 0–44)
AST: 844 U/L — ABNORMAL HIGH (ref 15–41)
Albumin: 1.9 g/dL — ABNORMAL LOW (ref 3.5–5.0)
Alkaline Phosphatase: 92 U/L (ref 38–126)
Anion gap: 7 (ref 5–15)
BUN: 62 mg/dL — ABNORMAL HIGH (ref 6–20)
CO2: 27 mmol/L (ref 22–32)
Calcium: 8 mg/dL — ABNORMAL LOW (ref 8.9–10.3)
Chloride: 95 mmol/L — ABNORMAL LOW (ref 98–111)
Creatinine, Ser: 1.69 mg/dL — ABNORMAL HIGH (ref 0.44–1.00)
GFR, Estimated: 35 mL/min — ABNORMAL LOW (ref >60)
Glucose, Bld: 243 mg/dL — ABNORMAL HIGH (ref 70–99)
Potassium: 3.3 mmol/L — ABNORMAL LOW (ref 3.5–5.1)
Sodium: 129 mmol/L — ABNORMAL LOW (ref 135–145)
Total Bilirubin: 1.3 mg/dL — ABNORMAL HIGH (ref 0.0–1.2)
Total Protein: 9.4 g/dL — ABNORMAL HIGH (ref 6.5–8.1)

## 2023-08-19 LAB — CBC
HCT: 34.9 % — ABNORMAL LOW (ref 36.0–46.0)
Hemoglobin: 11.5 g/dL — ABNORMAL LOW (ref 12.0–15.0)
MCH: 28.4 pg (ref 26.0–34.0)
MCHC: 33 g/dL (ref 30.0–36.0)
MCV: 86.2 fL (ref 80.0–100.0)
Platelets: 79 10*3/uL — ABNORMAL LOW (ref 150–400)
RBC: 4.05 MIL/uL (ref 3.87–5.11)
RDW: 18.2 % — ABNORMAL HIGH (ref 11.5–15.5)
WBC: 4 10*3/uL (ref 4.0–10.5)
nRBC: 3.8 % — ABNORMAL HIGH (ref 0.0–0.2)

## 2023-08-19 LAB — COOXEMETRY PANEL
Carboxyhemoglobin: 2.3 % — ABNORMAL HIGH (ref 0.5–1.5)
Methemoglobin: <0.7 % (ref 0.0–1.5)
O2 Saturation: 67.5 %
Total hemoglobin: 11.1 g/dL — ABNORMAL LOW (ref 12.0–16.0)

## 2023-08-19 MED ORDER — POTASSIUM CHLORIDE CRYS ER 20 MEQ PO TBCR
40.0000 meq | EXTENDED_RELEASE_TABLET | Freq: Once | ORAL | Status: AC
  Administered 2023-08-19: 40 meq via ORAL
  Filled 2023-08-19: qty 2

## 2023-08-19 MED ORDER — AMIODARONE HCL 200 MG PO TABS
200.0000 mg | ORAL_TABLET | Freq: Every day | ORAL | Status: DC
  Administered 2023-08-20 – 2023-08-26 (×6): 200 mg via ORAL
  Filled 2023-08-19 (×7): qty 1

## 2023-08-19 MED ORDER — PANTOPRAZOLE SODIUM 40 MG PO TBEC
40.0000 mg | DELAYED_RELEASE_TABLET | Freq: Every day | ORAL | Status: DC
  Administered 2023-08-20 – 2023-08-26 (×6): 40 mg via ORAL
  Filled 2023-08-19 (×7): qty 1

## 2023-08-19 NOTE — Plan of Care (Signed)
  Problem: Education: Goal: Knowledge of General Education information will improve Description: Including pain rating scale, medication(s)/side effects and non-pharmacologic comfort measures Outcome: Not Progressing   Problem: Health Behavior/Discharge Planning: Goal: Ability to manage health-related needs will improve Outcome: Not Progressing   Problem: Clinical Measurements: Goal: Ability to maintain clinical measurements within normal limits will improve Outcome: Not Progressing Goal: Will remain free from infection Outcome: Not Progressing Goal: Diagnostic test results will improve Outcome: Not Progressing Goal: Respiratory complications will improve Outcome: Not Progressing Goal: Cardiovascular complication will be avoided Outcome: Not Progressing   Problem: Activity: Goal: Risk for activity intolerance will decrease Outcome: Not Progressing   Problem: Nutrition: Goal: Adequate nutrition will be maintained Outcome: Not Progressing   Problem: Coping: Goal: Level of anxiety will decrease Outcome: Not Progressing   Problem: Elimination: Goal: Will not experience complications related to bowel motility Outcome: Not Progressing Goal: Will not experience complications related to urinary retention Outcome: Not Progressing   Problem: Pain Managment: Goal: General experience of comfort will improve and/or be controlled Outcome: Not Progressing   Problem: Safety: Goal: Ability to remain free from injury will improve Outcome: Not Progressing   Problem: Skin Integrity: Goal: Risk for impaired skin integrity will decrease Outcome: Not Progressing   Problem: Education: Goal: Ability to demonstrate management of disease process will improve Outcome: Not Progressing Goal: Ability to verbalize understanding of medication therapies will improve Outcome: Not Progressing Goal: Individualized Educational Video(s) Outcome: Not Progressing   Problem: Activity: Goal:  Capacity to carry out activities will improve Outcome: Not Progressing   Problem: Cardiac: Goal: Ability to achieve and maintain adequate cardiopulmonary perfusion will improve Outcome: Not Progressing   Problem: Education: Goal: Ability to demonstrate management of disease process will improve Outcome: Not Progressing Goal: Ability to verbalize understanding of medication therapies will improve Outcome: Not Progressing Goal: Individualized Educational Video(s) Outcome: Not Progressing   Problem: Activity: Goal: Capacity to carry out activities will improve Outcome: Not Progressing   Problem: Cardiac: Goal: Ability to achieve and maintain adequate cardiopulmonary perfusion will improve Outcome: Not Progressing

## 2023-08-19 NOTE — Plan of Care (Addendum)
 Pt resting throughout shift. Aox2-3, no c/o pain. VSS. Abd soft, round, 1 small BM this shift. Voiding w/o difficulty. BG and CVP monitored. Paste applied to skin tear on sacrum. Pt refusing am weight, states she "doesn't want to be bothered". Safety precautions in place, call light within reach. Family at bedside.   Problem: Education: Goal: Knowledge of General Education information will improve Description: Including pain rating scale, medication(s)/side effects and non-pharmacologic comfort measures Outcome: Not Progressing   Problem: Health Behavior/Discharge Planning: Goal: Ability to manage health-related needs will improve Outcome: Not Progressing   Problem: Clinical Measurements: Goal: Ability to maintain clinical measurements within normal limits will improve Outcome: Not Progressing Goal: Will remain free from infection Outcome: Not Progressing Goal: Diagnostic test results will improve Outcome: Not Progressing Goal: Respiratory complications will improve Outcome: Not Progressing Goal: Cardiovascular complication will be avoided Outcome: Not Progressing   Problem: Activity: Goal: Risk for activity intolerance will decrease Outcome: Not Progressing   Problem: Nutrition: Goal: Adequate nutrition will be maintained Outcome: Not Progressing   Problem: Coping: Goal: Level of anxiety will decrease Outcome: Not Progressing   Problem: Elimination: Goal: Will not experience complications related to bowel motility Outcome: Not Progressing Goal: Will not experience complications related to urinary retention Outcome: Not Progressing   Problem: Pain Managment: Goal: General experience of comfort will improve and/or be controlled Outcome: Not Progressing   Problem: Safety: Goal: Ability to remain free from injury will improve Outcome: Not Progressing   Problem: Skin Integrity: Goal: Risk for impaired skin integrity will decrease Outcome: Not Progressing   Problem:  Education: Goal: Ability to demonstrate management of disease process will improve Outcome: Not Progressing Goal: Ability to verbalize understanding of medication therapies will improve Outcome: Not Progressing Goal: Individualized Educational Video(s) Outcome: Not Progressing   Problem: Activity: Goal: Capacity to carry out activities will improve Outcome: Not Progressing   Problem: Cardiac: Goal: Ability to achieve and maintain adequate cardiopulmonary perfusion will improve Outcome: Not Progressing   Problem: Education: Goal: Ability to demonstrate management of disease process will improve Outcome: Not Progressing Goal: Ability to verbalize understanding of medication therapies will improve Outcome: Not Progressing Goal: Individualized Educational Video(s) Outcome: Not Progressing   Problem: Activity: Goal: Capacity to carry out activities will improve Outcome: Not Progressing   Problem: Cardiac: Goal: Ability to achieve and maintain adequate cardiopulmonary perfusion will improve Outcome: Not Progressing

## 2023-08-19 NOTE — Progress Notes (Signed)
 Progress Note   Patient: [name] WUJ:[id number] DOB: 1965-08-01 DOA: 08/14/2023     4 DOS: the patient was seen and examined on 08/19/2023   Brief hospital course: [name] was admitted to the hospital with the working diagnosis of heart failure exacerbation.   58 y.o. female with medical history significant of tobacco abuse, hypertension, hyperlipidemia, hypothyroidism and, history of stroke with left-sided residual deficits and dysarthria, pulmonary hypertension and right-sided heart failure; who presented to the hospital secondary to shortness of breath and weakness.  Patient reports symptom has been present for the last week or so and worsening.  Currently with difficulty laying down flat as she feels gasping for air and also noticing dyspnea on exertion.  Patient expressed increased swelling in her legs and some difficulty speaking in full sentences. Patient's husband at bedside at information of increased frequency and further deconditioning/generalized weakness.  No descriptions of focal deficits, sick contacts, fever, chest pain, hematuria, melena, hematochezia or any other complaints.   Workup in the ED demonstrating elevated BNP, vascular congestion on chest x-ray and positive urinalysis for UTI.  Physical examination revealed tachypnea, decreased breath sounds at the bases and expiratory wheezing.   Steroids, nebulizer treatment, IV Lasix  and antibiotics after cultures taken has been provided; TRH consulted for further evaluation and management.  Assessment and Plan: * Acute on chronic diastolic CHF (congestive heart failure) (HCC) Echocardiogram with reduced mild  LV systolic function EF 40 to 45%, interventricular septum is flattened in systole and diastole, RV systolic function with severe reduction, RV with severe enlargement, TR mild to moderate, RVSP 46.9 mmHg,   Pulmonary hypertension  RV failure Acute on chronic core pulmonale.  Cardiogenic shock   Documented urine  output is 1000 cc  Systolic blood pressure 95 to 956 mmHg range  SV02 67.5   Continue milrinone  for inotropic support 0.25 mcg/kg/min Midodrine  for blood pressure support 5, mg tid  05/17 furosemide  80 mg x1   Congested hepatopathy, ALT and AST are trending down 844 and 245.   Very poor prognosis, end stage heart failure. Limited therapy due to hypotension   AKI (acute kidney injury) (HCC) CKD stage 3a, hyperkalemia/ hypokalemia    Worsening renal function with serum cr at 1.69 with K at 3,3 and serum bicarbonate at 27  Na 129/   Add 40 meq Kcl  Continue milrinone  for inotropic support.  Follow up renal function and electrolytes in am. Avoid hypotension and nephrotoxic medications  Patient with poor prognosis, cardio renal syndrome.   Atrial flutter (HCC) Continue amiodarone  for rate control  Holding anticoagulation for now Positive thrombocytopenia   Essential hypertension Continue blood pressure support with midodrine    Acute metabolic encephalopathy Multifactorial, related to cardiogenic shock Continue supportive medical therapy, avoid sedatives   CVA (cerebral vascular accident) (HCC) Continue blood pressure control, on aspirin , no statin  Stage 2 pressure ulcer on sacrum present on admission.  Local wound care and air mattress.   UTI (urinary tract infection) Urine culture positive for  E Coli, sensitive to cephalosporins.  Continue antibiotic therapy with cefazolin  IV   No sepsis   Mixed hyperlipidemia Continue statin therapy   Acquired hypothyroidism Continue levothyroxine    Class II obesity Calculated BMI is 34.3        Subjective: patient very weak and deconditioned, only responding to simple questions,   Physical Exam: Vitals:   08/19/23 0000 08/19/23 0400 08/19/23 0741 08/19/23 1122  BP: 98/67 90/66 (!) 110/92 (!) 92/56  Pulse: 86  86  81  Resp: 19 17 18 18   Temp: 97.6 F (36.4 C)  (!) 97.4 F (36.3 C) 97.6 F (36.4 C)  TempSrc: Oral   Oral Axillary  SpO2: 95%     Weight:      Height:       Neurology awake, very deconditioned and ill looking appearing ENT with mild pallor Cardiovascular with S1 and S2 present and regular with no gallops or rubs, positive systolic murmur at the right lower sternal border  Positive JVD Positive lower extremity edema  Respiratory with poor inspiratory effort with bilateral rales with no wheezing Abdomen with no distention  Data Reviewed:    Family Communication: I spoke with patient's husband at the bedside, we talked in detail about patient's condition, plan of care and prognosis and all questions were addressed.   Disposition: Status is: Inpatient Remains inpatient appropriate because: IV diuresis and inotropic support   Planned Discharge Destination: Home      Author: Albertus Alt, MD 08/19/2023 1:30 PM  For on call review [email or web address].

## 2023-08-19 NOTE — IPAL (Signed)
 I spoke with patient's daughter (POA) at the bedside. I explained critical condition of [name] with end stage heart failure, affecting now other organs including her kidneys, liver and brain. Patient in multiorgan failure and not responding to maximal medical therapy.  She has a very poor prognosis and very high risk to continue to deteriorate, including cardiac and respiratory arrest.  Her daughter understands patient's condition and would like to continue current line of treatment. She wants to keep patient full code for now.

## 2023-08-19 NOTE — Progress Notes (Addendum)
 Progress Note  Patient Name: [name] Date of Encounter: 08/19/2023  Primary Cardiologist: Ola Berger, MD  Subjective   No symptoms, feels weak.  Had bowel movement today.  Inpatient Medications    Scheduled Meds:  aspirin  EC  81 mg Oral Daily   budesonide  (PULMICORT ) nebulizer solution  0.5 mg Nebulization BID   Chlorhexidine  Gluconate Cloth  6 each Topical Q0600   Gerhardt's butt cream   Topical BID   lactulose   30 g Oral BID   levothyroxine   50 mcg Oral QAC breakfast   midodrine   5 mg Oral TID WC   pantoprazole  (PROTONIX ) IV  40 mg Intravenous Q24H   potassium chloride   40 mEq Oral Once   sodium chloride  flush  10-40 mL Intracatheter Q12H   sodium chloride  flush  3 mL Intravenous Q12H   Continuous Infusions:  amiodarone  30 mg/hr (08/18/23 2255)    ceFAZolin  (ANCEF ) IV 1 g (08/18/23 2255)   milrinone  0.25 mcg/kg/min (08/18/23 2029)   PRN Meds: acetaminophen  **OR** acetaminophen , ipratropium-albuterol , prochlorperazine , sodium chloride  flush, sodium chloride  flush   Vital Signs    Vitals:   08/18/23 1955 08/19/23 0000 08/19/23 0400 08/19/23 0741  BP:  98/67 90/66 (!) 110/92  Pulse: 88 86  86  Resp: 18 19 17 18   Temp:  97.6 F (36.4 C)  (!) 97.4 F (36.3 C)  TempSrc:  Oral  Oral  SpO2: 97% 95%    Weight:      Height:        Intake/Output Summary (Last 24 hours) at 08/19/2023 0847 Last data filed at 08/19/2023 0600 Gross per 24 hour  Intake 1020.58 ml  Output 1000 ml  Net 20.58 ml   Filed Weights   08/16/23 0704 08/17/23 0424 08/18/23 0529  Weight: 96.1 kg 96.9 kg 96.4 kg    Telemetry     Personally reviewed.  NSR.  ECG    None performed today.  Physical Exam   GEN: No acute distress.   Neck: No JVD. Cardiac: RRR, no murmur, rub, or gallop.  Respiratory: Nonlabored. Clear to auscultation bilaterally. GI: Soft, nontender, bowel sounds present. MS: No edema; No deformity. Neuro:  Nonfocal. Psych: Alert and oriented x 3. Normal  affect.  Labs    Chemistry Recent Labs  Lab 08/18/23 0505 08/18/23 0846 08/19/23 0500  NA 133* 133* 129*  K 3.5 3.7 3.3*  CL 96* 99 95*  CO2 26 27 27   GLUCOSE 105* 117* 243*  BUN 70* 70* 62*  CREATININE 2.05* 2.15* 1.69*  CALCIUM  8.4* 8.4* 8.0*  PROT 10.1* 9.9* 9.4*  ALBUMIN  1.9* 2.0* 1.9*  AST 1,343* 1,283* 844*  ALT 532* 513* 245*  ALKPHOS 100 98 92  BILITOT 1.8* 1.7* 1.3*  GFRNONAA 28* 26* 35*  ANIONGAP 11 7 7      Hematology Recent Labs  Lab 08/18/23 0505 08/18/23 0846 08/19/23 0500  WBC 5.8 5.7 4.0  RBC 4.00 4.13 4.05  HGB 11.2* 11.5* 11.5*  HCT 34.9* 35.6* 34.9*  MCV 87.3 86.2 86.2  MCH 28.0 27.8 28.4  MCHC 32.1 32.3 33.0  RDW 17.9* 18.0* 18.2*  PLT PLATELET CLUMPS NOTED ON SMEAR, UNABLE TO ESTIMATE 73* 79*    Cardiac Enzymes Recent Labs  Lab 08/14/23 2341 08/15/23 0101  TROPONINIHS 117* 119*    BNP Recent Labs  Lab 08/14/23 2341  BNP 1,054.0*     DDimerNo results for input(s): "DDIMER" in the last 168 hours.   Radiology    ECHOCARDIOGRAM LIMITED Result Date: 08/17/2023  ECHOCARDIOGRAM LIMITED REPORT   Patient Name:   SAMARRA RIDGELY Date of Exam: 08/17/2023 Medical Rec #:  [id number]    Height:       66.0 in Accession #:    [id number]   Weight:       213.6 lb Date of Birth:  April 25, 1965    BSA:          2.057 m Patient Age:    58 years     BP:           112/72 mmHg Patient Gender: F            HR:           83 bpm. Exam Location:  [facility] Procedure: Limited Echo, Limited Color Doppler and Cardiac Doppler (Both            Spectral and Color Flow Doppler were utilized during procedure). Indications:    Dyspnea R06.00  History:        Patient has prior history of Echocardiogram examinations, most                 recent 07/11/2023. CHF, Stroke; Risk Factors:Hypertension and                 Dyslipidemia.  Sonographer:    Denese Finn RCS Referring Phys: [id number] Joyceann No BRANCH IMPRESSIONS  1. Left ventricular ejection fraction, by estimation, is 40 to  45%. The left ventricle has low normal function. Left ventricular endocardial border not optimally defined to evaluate regional wall motion. There is the interventricular septum is flattened  in systole and diastole, consistent with right ventricular pressure and volume overload.  2. Right ventricular systolic function is severely reduced. The right ventricular size is severely enlarged. There is moderately elevated pulmonary artery systolic pressure.  3. The tricuspid valve is abnormal. Tricuspid valve regurgitation is mild to moderate.  4. The inferior vena cava is normal in size with <50% respiratory variability, suggesting right atrial pressure of 8 mmHg.  5. Limited echo to evaluate LV and RV function FINDINGS  Left Ventricle: Left ventricular ejection fraction, by estimation, is 40 to 45%. The left ventricle has low normal function. Left ventricular endocardial border not optimally defined to evaluate regional wall motion. The interventricular septum is flattened in systole and diastole, consistent with right ventricular pressure and volume overload. Right Ventricle: The right ventricular size is severely enlarged. Right ventricular systolic function is severely reduced. There is moderately elevated pulmonary artery systolic pressure. The tricuspid regurgitant velocity is 3.12 m/s, and with an assumed right atrial pressure of 8 mmHg, the estimated right ventricular systolic pressure is 46.9 mmHg. Tricuspid Valve: The tricuspid valve is abnormal. Tricuspid valve regurgitation is mild to moderate. No evidence of tricuspid stenosis. Venous: The inferior vena cava is normal in size with less than 50% respiratory variability, suggesting right atrial pressure of 8 mmHg. LEFT VENTRICLE PLAX 2D LVIDd:         4.40 cm LVIDs:         3.50 cm LV PW:         1.20 cm LV IVS:        1.10 cm LVOT diam:     2.20 cm LVOT Area:     3.80 cm  LV Volumes (MOD) LV vol d, MOD A2C: 88.7 ml LV vol d, MOD A4C: 76.5 ml LV vol s, MOD  A2C: 49.3 ml LV vol s, MOD A4C: 44.9 ml LV SV MOD A2C:  39.4 ml LV SV MOD A4C:     76.5 ml LV SV MOD BP:      37.8 ml RIGHT VENTRICLE TAPSE (M-mode): 1.5 cm LEFT ATRIUM         Index       RIGHT ATRIUM           Index LA diam:    3.40 cm 1.65 cm/m  RA Area:     31.30 cm                                 RA Volume:   131.00 ml 63.70 ml/m   AORTA Ao Root diam: 3.30 cm TRICUSPID VALVE TR Peak grad:   38.9 mmHg TR Vmax:        312.00 cm/s  SHUNTS Systemic Diam: 2.20 cm Armida Lander MD Electronically signed by Armida Lander MD Signature Date/Time: 08/17/2023/2:28:34 PM    Final     Assessment & Plan    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  Echo this admission showed LVEF 40 to 45%, severe RV systolic dysfunction, severe RV enlargement, moderate PASP, CVP 8 mmHg and D-shaped IV septum.  AST/ALT initially significantly elevated likely secondary low cardiac output but currently trending down, will continue milrinone .  Discuss GOC.  Advanced heart failure team discussed with the family that she has end-stage RV failure with renal failure and liver failure, bedbound at baseline and family should need to discuss GOC, especially considering comfort care.  Strongly consider palliative care consultation.  Atrial tachycardia versus atrial flutter at [facility]: On amiodarone  drip.  AST/ALT significantly elevated likely secondary to low cardiac output versus amiodarone  toxicity. Will stop the IV amiodarone  drip and switch to p.o. amiodarone  200 mg twice daily.  Telemetry reviewed, has been in normal sinus rhythm.  AKI on CKD stage IIIa: Serum creatinine 1.2 on admission increased to 2 ish and currently 1.69.  Leg secondary low cardiac output.  Continue milrinone .  History of CVA, wheelchair-bound: Has aphasia but able to provide history.  On aspirin  81 mg once  daily.  Hold statin due to abnormal LFTs.  Rheumatoid arthritis: On methotrexate  and Humira  as outpatient, follows with rheum.   Signed, Lasalle Pointer, MD  08/19/2023, 8:47 AM

## 2023-08-19 NOTE — Plan of Care (Signed)
   Problem: Education: Goal: Knowledge of General Education information will improve Description Including pain rating scale, medication(s)/side effects and non-pharmacologic comfort measures Outcome: Progressing   Problem: Clinical Measurements: Goal: Will remain free from infection Outcome: Progressing   Problem: Nutrition: Goal: Adequate nutrition will be maintained Outcome: Progressing   Problem: Coping: Goal: Level of anxiety will decrease Outcome: Progressing

## 2023-08-20 DIAGNOSIS — I2721 Secondary pulmonary arterial hypertension: Secondary | ICD-10-CM | POA: Diagnosis not present

## 2023-08-20 DIAGNOSIS — I2609 Other pulmonary embolism with acute cor pulmonale: Secondary | ICD-10-CM | POA: Diagnosis not present

## 2023-08-20 DIAGNOSIS — I10 Essential (primary) hypertension: Secondary | ICD-10-CM | POA: Diagnosis not present

## 2023-08-20 DIAGNOSIS — I5021 Acute systolic (congestive) heart failure: Secondary | ICD-10-CM | POA: Diagnosis not present

## 2023-08-20 DIAGNOSIS — I4892 Unspecified atrial flutter: Secondary | ICD-10-CM | POA: Diagnosis not present

## 2023-08-20 DIAGNOSIS — R570 Cardiogenic shock: Secondary | ICD-10-CM | POA: Diagnosis not present

## 2023-08-20 DIAGNOSIS — I5033 Acute on chronic diastolic (congestive) heart failure: Secondary | ICD-10-CM | POA: Diagnosis not present

## 2023-08-20 DIAGNOSIS — N179 Acute kidney failure, unspecified: Secondary | ICD-10-CM | POA: Diagnosis not present

## 2023-08-20 LAB — COMPREHENSIVE METABOLIC PANEL WITH GFR
ALT: 97 U/L — ABNORMAL HIGH (ref 0–44)
AST: 492 U/L — ABNORMAL HIGH (ref 15–41)
Albumin: 1.9 g/dL — ABNORMAL LOW (ref 3.5–5.0)
Alkaline Phosphatase: 88 U/L (ref 38–126)
Anion gap: 7 (ref 5–15)
BUN: 54 mg/dL — ABNORMAL HIGH (ref 6–20)
CO2: 27 mmol/L (ref 22–32)
Calcium: 8.4 mg/dL — ABNORMAL LOW (ref 8.9–10.3)
Chloride: 98 mmol/L (ref 98–111)
Creatinine, Ser: 1.43 mg/dL — ABNORMAL HIGH (ref 0.44–1.00)
GFR, Estimated: 43 mL/min — ABNORMAL LOW (ref >60)
Glucose, Bld: 189 mg/dL — ABNORMAL HIGH (ref 70–99)
Potassium: 3.5 mmol/L (ref 3.5–5.1)
Sodium: 132 mmol/L — ABNORMAL LOW (ref 135–145)
Total Bilirubin: 1.5 mg/dL — ABNORMAL HIGH (ref 0.0–1.2)
Total Protein: 9.5 g/dL — ABNORMAL HIGH (ref 6.5–8.1)

## 2023-08-20 LAB — CBC
HCT: 35.4 % — ABNORMAL LOW (ref 36.0–46.0)
Hemoglobin: 11.5 g/dL — ABNORMAL LOW (ref 12.0–15.0)
MCH: 28 pg (ref 26.0–34.0)
MCHC: 32.5 g/dL (ref 30.0–36.0)
MCV: 86.1 fL (ref 80.0–100.0)
Platelets: 79 10*3/uL — ABNORMAL LOW (ref 150–400)
RBC: 4.11 MIL/uL (ref 3.87–5.11)
RDW: 18.5 % — ABNORMAL HIGH (ref 11.5–15.5)
WBC: 3.2 10*3/uL — ABNORMAL LOW (ref 4.0–10.5)
nRBC: 2.5 % — ABNORMAL HIGH (ref 0.0–0.2)

## 2023-08-20 LAB — COOXEMETRY PANEL
Carboxyhemoglobin: 2.5 % — ABNORMAL HIGH (ref 0.5–1.5)
Methemoglobin: 1 % (ref 0.0–1.5)
O2 Saturation: 73.9 %
Total hemoglobin: 12 g/dL (ref 12.0–16.0)

## 2023-08-20 LAB — GLUCOSE, CAPILLARY
Glucose-Capillary: 100 mg/dL — ABNORMAL HIGH (ref 70–99)
Glucose-Capillary: 102 mg/dL — ABNORMAL HIGH (ref 70–99)
Glucose-Capillary: 115 mg/dL — ABNORMAL HIGH (ref 70–99)
Glucose-Capillary: 145 mg/dL — ABNORMAL HIGH (ref 70–99)
Glucose-Capillary: 94 mg/dL (ref 70–99)

## 2023-08-20 MED ORDER — POTASSIUM CHLORIDE CRYS ER 20 MEQ PO TBCR
40.0000 meq | EXTENDED_RELEASE_TABLET | Freq: Once | ORAL | Status: AC
  Administered 2023-08-20: 40 meq via ORAL
  Filled 2023-08-20: qty 2

## 2023-08-20 MED ORDER — ACETAMINOPHEN 325 MG PO TABS
650.0000 mg | ORAL_TABLET | ORAL | Status: AC
  Administered 2023-08-20: 650 mg via ORAL
  Filled 2023-08-20: qty 2

## 2023-08-20 NOTE — Progress Notes (Signed)
 Progress Note  Patient Name: [name] Date of Encounter: 08/20/2023  Primary Cardiologist: Ola Berger, MD  Subjective   No symptoms except for pain in the left foot that resolved with Tylenol .  No erythema or swelling.  Inpatient Medications    Scheduled Meds:  amiodarone   200 mg Oral Daily   aspirin  EC  81 mg Oral Daily   budesonide  (PULMICORT ) nebulizer solution  0.5 mg Nebulization BID   Chlorhexidine  Gluconate Cloth  6 each Topical Q0600   Gerhardt's butt cream   Topical BID   lactulose   30 g Oral BID   levothyroxine   50 mcg Oral QAC breakfast   midodrine   5 mg Oral TID WC   pantoprazole   40 mg Oral Daily   sodium chloride  flush  10-40 mL Intracatheter Q12H   sodium chloride  flush  3 mL Intravenous Q12H   Continuous Infusions:   ceFAZolin  (ANCEF ) IV 1 g (08/20/23 0827)   milrinone  0.25 mcg/kg/min (08/20/23 0415)   PRN Meds: acetaminophen  **OR** acetaminophen , ipratropium-albuterol , prochlorperazine , sodium chloride  flush, sodium chloride  flush   Vital Signs    Vitals:   08/20/23 0400 08/20/23 0500 08/20/23 0805 08/20/23 0831  BP: 112/71  104/85   Pulse:   86   Resp: 18  17   Temp: 98.6 F (37 C)     TempSrc: Axillary     SpO2:   96% 99%  Weight:  113.4 kg    Height:        Intake/Output Summary (Last 24 hours) at 08/20/2023 0946 Last data filed at 08/20/2023 0900 Gross per 24 hour  Intake 461.98 ml  Output 750 ml  Net -288.02 ml   Filed Weights   08/18/23 0529 08/19/23 0713 08/20/23 0500  Weight: 96.4 kg 112.5 kg 113.4 kg    Telemetry     Personally reviewed.  NSR.  ECG    None performed today.  Physical Exam   GEN: No acute distress.   Neck: No JVD. Cardiac: RRR, no murmur, rub, or gallop.  Respiratory: Nonlabored. Clear to auscultation bilaterally. GI: Soft, nontender, bowel sounds present. MS: No edema; No deformity. Neuro:  Nonfocal. Psych: Alert and oriented x 3. Normal affect.  Labs    Chemistry Recent Labs  Lab  08/18/23 0846 08/19/23 0500 08/20/23 0440  NA 133* 129* 132*  K 3.7 3.3* 3.5  CL 99 95* 98  CO2 27 27 27   GLUCOSE 117* 243* 189*  BUN 70* 62* 54*  CREATININE 2.15* 1.69* 1.43*  CALCIUM  8.4* 8.0* 8.4*  PROT 9.9* 9.4* 9.5*  ALBUMIN  2.0* 1.9* 1.9*  AST 1,283* 844* 492*  ALT 513* 245* 97*  ALKPHOS 98 92 88  BILITOT 1.7* 1.3* 1.5*  GFRNONAA 26* 35* 43*  ANIONGAP 7 7 7      Hematology Recent Labs  Lab 08/18/23 0846 08/19/23 0500 08/20/23 0440  WBC 5.7 4.0 3.2*  RBC 4.13 4.05 4.11  HGB 11.5* 11.5* 11.5*  HCT 35.6* 34.9* 35.4*  MCV 86.2 86.2 86.1  MCH 27.8 28.4 28.0  MCHC 32.3 33.0 32.5  RDW 18.0* 18.2* 18.5*  PLT 73* 79* 79*    Cardiac Enzymes Recent Labs  Lab 08/14/23 2341 08/15/23 0101  TROPONINIHS 117* 119*    BNP Recent Labs  Lab 08/14/23 2341  BNP 1,054.0*     DDimerNo results for input(s): "DDIMER" in the last 168 hours.   Radiology    No results found.   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 Echo this admission showed LVEF 40 to 45%, severe RV systolic dysfunction, severe RV enlargement, moderate PASP, CVP 8 mmHg and D-shaped IV septum.  Holding diuretics and currently on midodrine .  Initially AST/ALT significantly elevated due to low cardiac output versus amiodarone  toxicity.  Trending down after discontinuing amiodarone .  Discussed GOC. Advanced heart failure team discussed with the family that she has end-stage RV failure with renal failure and liver failure, bedbound at baseline and family should need to discuss GOC, especially considering comfort care.  Strongly consider palliative care consultation.  Atrial tachycardia versus atrial flutter at [facility]: Initially on amiodarone  drip, discontinued yesterday due to abnormal LFTs. No recurrence of atrial arrhythmias. Continue p.o.  amiodarone  200 mg once daily.  LFTs significantly improved now.  Telemetry reviewed, has been in normal sinus rhythm with no alarms.  AKI on CKD stage IIIa: Serum creatinine 1.2 on admission increased to 2 ish and currently 1.4.  Likely secondary to low cardiac output.  Continue milrinone .  History of CVA, wheelchair-bound: Has aphasia but able to provide history.  On aspirin  81 mg once daily, hold statin due to abnormal LFTs.  Rheumatoid arthritis: On methotrexate  and Humira  as outpatient.   Signed, Lasalle Pointer, MD  08/20/2023, 9:46 AM

## 2023-08-20 NOTE — Progress Notes (Addendum)
 Progress Note   Patient: [name] WUJ:[id number] DOB: 11/02/1965 DOA: 08/14/2023     5 DOS: the patient was seen and examined on 08/20/2023   Brief hospital course: [name] was admitted to the hospital with the working diagnosis of heart failure exacerbation.   58 y.o. 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   Patient reports symptoms has been present for the last week prior to admission with orthopnea, dyspnea on exertion and lower extremity edema.  On her initial physical examination her blood pressure was 118/94, HR 112, RR 22 and 02 saturation 100% on supplemental 02 per [place]  Lungs with positive bilateral wheezing and decreased breath sounds, heart with S1 and S2 present and regular with no gallops, abdomen with no distention and positive lower extremity edema.   VBG 7,39/ 50/ <31/30/27.8%   Na 133, K 4.7 Cl 100, bicarbonate 25 glucose 81, bun 39 cr 1.23 Mg 2.2  AST 936 Frost 346 total bilirubin 1.8  High sensitive troponin 117 and 119  BNP 1,054  Lactic acid 3,0  Wbc 2.9 hgb 13,8 plt 122  TSH 5.9  Sars covid 19 negative Influenza negative  RSV negative   Urine analsys SG 1,011, protein 100, positive nitrates, moderate leukocytes and moderate hgb, 21-50 wbc and 0-5 rbc  Urine culture positive for E coli > 100,000 CFU    Chest radiograph with left rotation, cardiomegaly, bilateral hilar vascular congestion with fluid in the right fissure.   EKG 107 bpm, left axis deviation, normal intervals, qtc 453, sinus rhythm with bi atrial enlargement, with no significant ST segment changes, negative T wave lead I and aVL.    Patient placed on IV ceftriaxone  for antibiotic therapy and with IV furosemide  for diuresis.  Noted low output failure and placed on  inotropic support with milrinone .  05/16 Transferred to [facility]. Advanced heart failure consultation, determined patient end stage right heart failure.  05/18 continue very poor prognosis.    Assessment and Plan: * Acute on chronic diastolic CHF (congestive heart failure) (HCC) Echocardiogram with reduced mild  LV systolic function EF 40 to 45%, interventricular septum is flattened in systole and diastole, RV systolic function with severe reduction, RV with severe enlargement, TR mild to moderate, RVSP 46.9 mmHg,   Pulmonary hypertension  RV failure Acute on chronic core pulmonale.  Cardiogenic shock   Documented urine output is 750 cc  Systolic blood pressure 95 to 956 mmHg range  SV02 73.9  Milrinone  for inotropic support 0.25 mcg/kg/min Midodrine  for blood pressure support 5, mg tid  05/17 furosemide  80 mg x1   Congested hepatopathy, Frost and AST are trending down.  Very poor prognosis, end stage heart failure. Limited therapy due to hypotension   AKI (acute kidney injury) (HCC) CKD stage 3a, hyperkalemia/ hypokalemia  hyponatremia.   Renal function with serum cr at 1,43 with K at 3,5 and serum bicarbonate at 27  Na 132   Add 40 meq Kcl  Continue milrinone  for inotropic support.  Follow up renal function and electrolytes in am. Avoid hypotension and nephrotoxic medications  Patient with poor prognosis, cardio renal syndrome.   Atrial flutter (HCC) Continue amiodarone  for rate control  Holding anticoagulation for now Positive thrombocytopenia   Essential hypertension Continue blood pressure support with midodrine    Acute metabolic encephalopathy Multifactorial, related to cardiogenic shock  Continue supportive medical therapy, avoid sedatives   CVA (cerebral vascular accident) (HCC) Continue blood pressure control, on aspirin , no statin  Stage 2 pressure ulcer on sacrum present on admission.  Local wound care and air mattress.   UTI (urinary tract infection) Urine  culture positive for  E Coli, sensitive to cephalosporins.  She has completed antibiotic therapy   No sepsis   Mixed hyperlipidemia Continue statin therapy   Acquired hypothyroidism Continue levothyroxine    Class II obesity Calculated BMI is 34.3         Subjective: Patient very weak and deconditioned, poorly reactive, noted to have dyspnea at rest.   Physical Exam: Vitals:   08/20/23 0500 08/20/23 0805 08/20/23 0831 08/20/23 1140  BP:  104/85  101/67  Pulse:  86  88  Resp:  17  17  Temp:      TempSrc:      SpO2:  96% 99% 97%  Weight: 113.4 kg     Height:       Neurology awake, deconditioned and ill looking appearing, chronic left sided hemiparesis and speech difficulties.  ENT with mild pallor Cardiovascular with S1 and S2 present and regular with no gallops or rubs, positive systolic murmur at the right lower sternal border Positive JVD Positive lower extremity edema Respiratory with poor inspiratory effort with bilateral rales on anterior auscultation  Abdomen with no distention   Data Reviewed:    Family Communication: her husband is at the bedside   Disposition: Status is: Inpatient Remains inpatient appropriate because: end stage heart failure   Planned Discharge Destination: Home      Author: Albertus Alt, MD 08/20/2023 2:00 PM  For on call review [email or web address].

## 2023-08-21 DIAGNOSIS — Z515 Encounter for palliative care: Secondary | ICD-10-CM | POA: Diagnosis not present

## 2023-08-21 DIAGNOSIS — N179 Acute kidney failure, unspecified: Secondary | ICD-10-CM | POA: Diagnosis not present

## 2023-08-21 DIAGNOSIS — Z7189 Other specified counseling: Secondary | ICD-10-CM | POA: Diagnosis not present

## 2023-08-21 DIAGNOSIS — I4892 Unspecified atrial flutter: Secondary | ICD-10-CM | POA: Diagnosis not present

## 2023-08-21 DIAGNOSIS — I10 Essential (primary) hypertension: Secondary | ICD-10-CM | POA: Diagnosis not present

## 2023-08-21 DIAGNOSIS — I5033 Acute on chronic diastolic (congestive) heart failure: Secondary | ICD-10-CM | POA: Diagnosis not present

## 2023-08-21 LAB — COMPREHENSIVE METABOLIC PANEL WITH GFR
ALT: 40 U/L (ref 0–44)
AST: 311 U/L — ABNORMAL HIGH (ref 15–41)
Albumin: 1.9 g/dL — ABNORMAL LOW (ref 3.5–5.0)
Alkaline Phosphatase: 88 U/L (ref 38–126)
Anion gap: 7 (ref 5–15)
BUN: 44 mg/dL — ABNORMAL HIGH (ref 6–20)
CO2: 27 mmol/L (ref 22–32)
Calcium: 8.7 mg/dL — ABNORMAL LOW (ref 8.9–10.3)
Chloride: 103 mmol/L (ref 98–111)
Creatinine, Ser: 1.17 mg/dL — ABNORMAL HIGH (ref 0.44–1.00)
GFR, Estimated: 54 mL/min — ABNORMAL LOW (ref >60)
Glucose, Bld: 84 mg/dL (ref 70–99)
Potassium: 4 mmol/L (ref 3.5–5.1)
Sodium: 137 mmol/L (ref 135–145)
Total Bilirubin: 1.6 mg/dL — ABNORMAL HIGH (ref 0.0–1.2)
Total Protein: 9.8 g/dL — ABNORMAL HIGH (ref 6.5–8.1)

## 2023-08-21 LAB — GLUCOSE, CAPILLARY
Glucose-Capillary: 101 mg/dL — ABNORMAL HIGH (ref 70–99)
Glucose-Capillary: 79 mg/dL (ref 70–99)
Glucose-Capillary: 85 mg/dL (ref 70–99)
Glucose-Capillary: 86 mg/dL (ref 70–99)
Glucose-Capillary: 89 mg/dL (ref 70–99)

## 2023-08-21 LAB — COOXEMETRY PANEL
Carboxyhemoglobin: 3 % — ABNORMAL HIGH (ref 0.5–1.5)
Methemoglobin: 1.1 % (ref 0.0–1.5)
O2 Saturation: 65.2 %
Total hemoglobin: 12.7 g/dL (ref 12.0–16.0)

## 2023-08-21 LAB — CULTURE, BLOOD (ROUTINE X 2)
Culture: NO GROWTH
Culture: NO GROWTH

## 2023-08-21 LAB — CBC
HCT: 37.6 % (ref 36.0–46.0)
Hemoglobin: 12.1 g/dL (ref 12.0–15.0)
MCH: 28.4 pg (ref 26.0–34.0)
MCHC: 32.2 g/dL (ref 30.0–36.0)
MCV: 88.3 fL (ref 80.0–100.0)
Platelets: 86 10*3/uL — ABNORMAL LOW (ref 150–400)
RBC: 4.26 MIL/uL (ref 3.87–5.11)
RDW: 18.8 % — ABNORMAL HIGH (ref 11.5–15.5)
WBC: 4.4 10*3/uL (ref 4.0–10.5)
nRBC: 1.4 % — ABNORMAL HIGH (ref 0.0–0.2)

## 2023-08-21 NOTE — TOC Progression Note (Signed)
 Transition of Care [facility]) - Progression Note    Patient Details  Name: [name] MRN: [id number] Date of Birth: 1966-03-24  [facility] CM/SW Contact  Ernst Heap Phone Number: [phone] 08/21/2023, 3:27 PM  Clinical Narrative:  HF CSW/NCM will continue to follow and monitor patients dc needs.   TOC will continue following.      Expected Discharge Plan: Home/Self Care Barriers to Discharge: Continued Medical Work up  Expected Discharge Plan and Services In-house Referral: Clinical Social Work Discharge Planning Services: CM Consult   Living arrangements for the past 2 months: Single Family Home                                       Social Determinants of Health (SDOH) Interventions SDOH Screenings   Food Insecurity: No Food Insecurity (08/15/2023)  Housing: Low Risk  (08/15/2023)  Transportation Needs: No Transportation Needs (08/15/2023)  Utilities: Not At Risk (08/15/2023)  Alcohol Screen: Low Risk  (08/31/2021)  Depression (PHQ2-9): Low Risk  (08/31/2021)  Financial Resource Strain: Low Risk  (04/07/2023)   Received from [facility]  Physical Activity: Sufficiently Active (08/31/2021)  Recent Concern: Physical Activity - Inactive (07/07/2021)   Received from [facility], [facility]  Social Connections: Unknown (07/08/2022)   Received from [facility], [facility]  Stress: No Stress Concern Present (08/31/2021)  Tobacco Use: High Risk (08/15/2023)    Readmission Risk Interventions    08/15/2023    1:45 PM  Readmission Risk Prevention Plan  Transportation Screening Complete  Home Care Screening Complete  Medication Review (RN CM) Complete

## 2023-08-21 NOTE — Progress Notes (Signed)
 Progress Note   Patient: [name] ZOX:[id number] DOB: [birth year] DOA: 08/14/2023     6 DOS: the patient was seen and examined on 08/21/2023   Brief hospital course: [name] was admitted to the hospital with the working diagnosis of heart failure exacerbation.   58 y.o. 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   Patient reports symptoms has been present for the last week prior to admission with orthopnea, dyspnea on exertion and lower extremity edema.  On her initial physical examination her blood pressure was 118/94, HR 112, RR 22 and 02 saturation 100% on supplemental 02 per Foley  Lungs with positive bilateral wheezing and decreased breath sounds, heart with S1 and S2 present and regular with no gallops, abdomen with no distention and positive lower extremity edema.   VBG 7,39/ 50/ <31/30/27.8%   Na 133, K 4.7 Cl 100, bicarbonate 25 glucose 81, bun 39 cr 1.23 Mg 2.2  AST 936 ALT 346 total bilirubin 1.8  High sensitive troponin 117 and 119  BNP 1,054  Lactic acid 3,0  Wbc 2.9 hgb 13,8 plt 122  TSH 5.9  Sars covid 19 negative Influenza negative  RSV negative   Urine analsys SG 1,011, protein 100, positive nitrates, moderate leukocytes and moderate hgb, 21-50 wbc and 0-5 rbc  Urine culture positive for [name] > 100,000 CFU    Chest radiograph with left rotation, cardiomegaly, bilateral hilar vascular congestion with fluid in the right fissure.   EKG 107 bpm, left axis deviation, normal intervals, qtc 453, sinus rhythm with bi atrial enlargement, with no significant ST segment changes, negative T wave lead I and aVL.    Patient placed on IV ceftriaxone  for antibiotic therapy and with IV furosemide  for diuresis.  Noted low output failure and placed on  inotropic support with milrinone .  05/16 Transferred to [facility]. Advanced heart failure consultation, determined patient end stage right heart failure.  05/18 continue very poor prognosis.    Assessment and Plan: * Acute on chronic diastolic CHF (congestive heart failure) (HCC) Echocardiogram with reduced mild  LV systolic function EF 40 to 45%, interventricular septum is flattened in systole and diastole, RV systolic function with severe reduction, RV with severe enlargement, TR mild to moderate, RVSP 46.9 mmHg,   Pulmonary hypertension  RV failure Acute on chronic core pulmonale.  Cardiogenic shock   No Documented urine output  Systolic blood pressure 95 to 811 mmHg range  SV02 65.2  Milrinone  for inotropic support 0.25 mcg/kg/min Midodrine  for blood pressure support 5, mg tid  05/17 furosemide  80 mg x1   Congested hepatopathy, ALT and AST are trending down.  Very poor prognosis, end stage heart failure. Limited therapy due to hypotension   AKI (acute kidney injury) (HCC) CKD stage 3a, hyperkalemia/ hypokalemia  hyponatremia.   Serum cr today is 1,17 with K at 4,0 and serum bicarbonate at 27  Na 137  Continue milrinone  for inotropic support.  Follow up renal function and electrolytes in am. Avoid hypotension and nephrotoxic medications  Patient with poor prognosis, cardio renal syndrome.   Atrial flutter (HCC) Continue amiodarone  for rate control  Holding anticoagulation for now Positive thrombocytopenia   Essential hypertension Continue blood pressure support with midodrine    Acute metabolic encephalopathy Multifactorial, related to cardiogenic shock Continue supportive medical therapy, avoid sedatives   CVA (cerebral  vascular accident) (HCC) Continue blood pressure control, on aspirin , no statin  Stage 2 pressure ulcer on sacrum present on admission.  Local wound care and air mattress.   UTI (urinary tract infection) Urine culture positive for  [name], sensitive  to cephalosporins.  She has completed antibiotic therapy   No sepsis   Mixed hyperlipidemia Continue statin therapy   Acquired hypothyroidism Continue levothyroxine    Class II obesity Calculated BMI is 34.3         Subjective: Patient continue very weak and deconditioned.   Physical Exam: Vitals:   08/21/23 0300 08/21/23 0712 08/21/23 0808 08/21/23 1128  BP: 95/71 115/83  114/86  Pulse: 85 89  93  Resp: 18 20  19   Temp: 97.9 F (36.6 C) (!) 96.9 F (36.1 C)  (!) 97.2 F (36.2 C)  TempSrc: Axillary Axillary  Axillary  SpO2: 100% 96% 100% 94%  Weight: 115.7 kg     Height:       Neurology awake but somnolent, only responds to simple questions, and commands ENT with mild pallor Cardiovascular with S1 and S2 present and regular with no gallops, or rubs, positive murmur at the right lower sternal border Positive JVD Respiratory with rales at bases with no wheezing  Abdomen with no distention  Lower extremity edema  Data Reviewed:    Family Communication: I spoke with patient's daughter and husband at the bedside, we talked in detail about patient's condition, plan of care and prognosis and all questions were addressed.   Disposition: Status is: Inpatient Remains inpatient appropriate because: cardiogenic shock   Planned Discharge Destination: Home     Author: Albertus Alt, MD 08/21/2023 3:16 PM  For on call review [email or web address].

## 2023-08-21 NOTE — Progress Notes (Signed)
 Patient's daughter called this nurse in to report that the patient's right hand was dirty and wanted to know why if she has had a CHG bath why would her hand be dirty. This nurse was unable to explain why due to patient having multiple baths during this shift. Patient's daughter requesting for patient's hands to be wash before each meal because the patient is feeding herself.

## 2023-08-21 NOTE — IPAL (Signed)
 Family meeting today with daughter, son and husband. Informed family that patient with end stage heart failure, not candidate for advance therapies. Continue to have very poor prognosis and likely will continue to deteriorate despite aggressive medical therapy.  Her daughter who is the POA is in agreement of no further escalation of care, she will discuss with the rest of the family further advance directives. For now she wants to keep her mother full code.

## 2023-08-21 NOTE — Telephone Encounter (Signed)
 Patient's daughter, [name], advised She does not need any particular labs before our next visit. [name] verbalized understanding.

## 2023-08-21 NOTE — Progress Notes (Signed)
Patient refused to have her blood sugar checked.

## 2023-08-21 NOTE — Progress Notes (Addendum)
 Advanced Heart Failure Rounding Note  Cardiologist: Ola Berger, MD  Chief Complaint: Acute on chronic RV failure/Cardiogenic shock Subjective:    Coox 65%. CVP 6-7. On 0.25 milrinone .  Weights do not appear accurate.  Cr 1.43>1.17  Sleeping in bed. Not participatory in exam. Confused.  Objective:    Weight Range: 115.7 kg Body mass index is 41.16 kg/m.   Vital Signs:   Temp:  [96.9 F (36.1 C)-98.7 F (37.1 C)] 96.9 F (36.1 C) (05/19 0712) Pulse Rate:  [80-89] 89 (05/19 0712) Resp:  [17-20] 20 (05/19 0712) BP: (95-116)/(67-85) 115/83 (05/19 0712) SpO2:  [96 %-100 %] 96 % (05/19 0712) Weight:  [115.7 kg] 115.7 kg (05/19 0300) Last BM Date : 08/20/23  Weight change: Filed Weights   08/19/23 0713 08/20/23 0500 08/21/23 0300  Weight: 112.5 kg 113.4 kg 115.7 kg   Intake/Output:  Intake/Output Summary (Last 24 hours) at 08/21/2023 0715 Last data filed at 08/20/2023 1820 Gross per 24 hour  Intake 315.36 ml  Output --  Net 315.36 ml    Physical Exam    CVP 6-7 General: Chronically-ill appearing.  Cardiac: JVP difficult to assess. S1 and S2 present. No murmurs or rub. Extremities: Warm and dry. No peripheral edema.  Neuro: Confused, garbled speech  Telemetry   SR in 80s (personally reviewed)  Labs    CBC Recent Labs    08/20/23 0440 08/21/23 0350  WBC 3.2* 4.4  HGB 11.5* 12.1  HCT 35.4* 37.6  MCV 86.1 88.3  PLT 79* 86*   Basic Metabolic Panel Recent Labs    16/10/96 0440 08/21/23 0350  NA 132* 137  K 3.5 4.0  CL 98 103  CO2 27 27  GLUCOSE 189* 84  BUN 54* 44*  CREATININE 1.43* 1.17*  CALCIUM  8.4* 8.7*   Liver Function Tests Recent Labs    08/20/23 0440 08/21/23 0350  AST 492* 311*  ALT 97* 40  ALKPHOS 88 88  BILITOT 1.5* 1.6*  PROT 9.5* 9.8*  ALBUMIN  1.9* 1.9*   BNP (last 3 results) Recent Labs    07/09/23 1320 08/14/23 2341  BNP 566.0* 1,054.0*   Imaging   No results found.  Medications:    Scheduled  Medications:  amiodarone   200 mg Oral Daily   aspirin  EC  81 mg Oral Daily   budesonide  (PULMICORT ) nebulizer solution  0.5 mg Nebulization BID   Chlorhexidine  Gluconate Cloth  6 each Topical Q0600   Gerhardt's butt cream   Topical BID   lactulose   30 g Oral BID   levothyroxine   50 mcg Oral QAC breakfast   midodrine   5 mg Oral TID WC   pantoprazole   40 mg Oral Daily   sodium chloride  flush  10-40 mL Intracatheter Q12H   sodium chloride  flush  3 mL Intravenous Q12H   Infusions:  milrinone  0.25 mcg/kg/min (08/21/23 0512)   PRN Medications: acetaminophen  **OR** acetaminophen , ipratropium-albuterol , prochlorperazine , sodium chloride  flush, sodium chloride  flush  Patient Profile   58 y.o. with a history of CVA w/ residual left hemiparesis, HTN, HLD, anemia, seropositive rheumatoid arthritis, and hypothyroidism and recently diagnosed w/ severe RV failure and PH. Admitted for acute on chronic RV failure with Cardiogenic shock.   Assessment/Plan   1. Acute on Chronic RV Failure w/ Cardiogenic Shock / Pulmonary HTN  - End-stage RV failure - Echo 4/25 preserved LVEF, 50-55%, and severely reduced RV  - RHC 4/25: (RA 4, PA 69/29 m44, PCWP 13, PVR of 6.4 with CI of 2.4 L/min/m2). -  V/Q scan negative. Pt declined PFT evaluation  - Failed Sildenafil  due to hypotension. Started on Midodrine   - w/ cardiogenic shock w/ lactic acidosis, shock liver and AKI. LA 4.7, Co-ox 47%. Improving on Milrinone  0.25.  - continue milrinone  support. CVP only 6-7. - continue midodrine  for BP support  - she is end-stage. Has been essentially bedbound since last admission. There are no durable solutions for her.  - Discussed her situation with patient's spouse at bedside. He is calling family to further discuss. She has end-stage RV failure w/ cardiogenic shock and MSOF. Overall poor prognosis. Improving with milrinone  but no long-term options. After multiple discussions with family she currently remains full code.    2. Aflutter vs atrial tachycardia - Noted intermittently at [facility] on 05/14. Initially started on amiodarone  gtt and later stopped d/t hypotension. - She was not anticoagulated d/t INR > 2 in setting of liver disease - In SR with 1AVB - ?role of anticoagulation.    3. Shock Liver - improving with inotrope. AST/ALT 311/40 - NH3 elevated at 50 - Continue inotropic support - now on lactulose  per primary   3. AKI on CKD IIIa - SCr 1.2>>1.9 (b/l 0.6) -> 2.2 - CG shock management per above - support BP w/ midodrine  - sCr down to 1.17; weight significantly up since admision   4. Hyperkalemia - resolved with Lokelma    5. Seropositive Rheumatoid Arthritis  - on Humira  and methotrexate  as outpatient  - Followed by Dr. Rodell Citrin   6. Hx CVA - Weelchair bound, some aphasia - On aspirin . Hold off on restarting statin with liver shock  Length of Stay: 6  [place] Lee, NP  08/21/2023, 7:15 AM  Advanced Heart Failure Team Pager [phone] (M-F; 7a - 5p)  Please contact [facility] for night-coverage after hours (5p -7a ) and weekends on [email or web address]  Patient seen and examined with the above-signed Advanced Practice Provider and/or Housestaff. I personally reviewed laboratory data, imaging studies and relevant notes. I independently examined the patient and formulated the important aspects of the plan. I have edited the note to reflect any of my changes or salient points. I have personally discussed the plan with the patient and/or family.  Fatigued this am. Confused at times. Denies SOB. Says she wants to sleep a bit more  Husband at bedside  General:  Lying in bed. Very weak  No resp difficulty HEENT: normal Neck: supple. JVP 7-8 Carotids 2+ bilat; no bruits. No lymphadenopathy or thryomegaly appreciated. Cor: Regular rate & rhythm. + RV lift 2/6 TR Lungs: clear Abdomen: soft, nontender, nondistended. No hepatosplenomegaly. No bruits or masses. Good bowel sounds. Extremities: no cyanosis,  clubbing, rash, edema Neuro: awake but fatigued. Moves all 4  She has end-stage HF with no durable options for improvement. Although hemodynamics have improved with milrinone  this effect will be transient and she will still be bedbound.   I do not see any way her family will be able to provide enough care for her at home despite their care for her. Have recommend residential Hospice as the only viable option.  Palliative Care team in room and we will plan to have a family meeting to work through options.   Jules Oar, MD  11:00 AM

## 2023-08-21 NOTE — Progress Notes (Addendum)
+  Palliative:  HPI:  58 y.o. female  with past medical history of HTN, HLD, CVA, arthritis, asthma, CHF, pulmonary hypertension admitted on 08/14/2023 with acute exacerbation of heart failure, elevated troponin secondary to demand ischemia.    Chart review completed. Reviewed heart failure and previous palliative visits. Reviewed labs. I met today at Ms. Mcanelly' bedside along with her husband and Dr. Julane Ny. Dr. Bensimhon is explaining to Mr. Wilbourne as well as a family member, Ms. Keary Passey, that is over phone. Explained end stage RV failure. Explained poor prognosis. Explained concern for suffering and poor outcome. Dr. Bensimhon discussed his concern for care moving forward and recommendation for consideration of hospice. Mr. Biernat is very tearful. I spent some time with Mr. Fite for support.   Update: I received notification from RN that family are at bedside. I met with Ms. Melena, Mr. Viglione, daughter [name], and one of her sons with Dr. Sunnie England. We discussed end stage RV failure. We discussed milrinone  and how this is helping but also it's limitations. [name] asks about any procedures and why she would not be a candidate. We discussed that there is no course of action for valve repair, stenting, or LVAD that would be beneficial as this will not improve the function of the heart muscle. Unfortunately we do not have any interventions that will reverse the weakening to the heart muscle.   [name] spoke with her family at bedside. They discussed that one of Ms. Pompei' sons in Feb had a critical illness and hospitalized in 2H and they were told that he would not make it but he is up and walking and doing well. They report that they wish to maintain full code status and they have no desire to stop milrinone . [name] also shares that palliative does not need to come by and continue frequent visits but family will call on us  when needed - I provided them with my contact information. I also encouraged [name]  to bring in a copy of HCPOA/Living Will or any documents that they so we can file these in her mother's records.   All questions/concerns addressed to the best of my ability. Emotional support provided. Discussed with RN, Dr. Sunnie England, and Dr. Bensimhon.   Exam: Awake, alert. Responds to simple questions/commands. She does speak and make her needs known. No distress. Poor reserve. Breathing regular, unlabored at rest.   Plan: - Full code, full scope - Palliative will follow peripherally - please call for acute needs and f/u  - Will be available when/if family desires f/u  60 min  Vila Grayer, NP Palliative Medicine Team Pager [phone] (Please see [email or web address] for schedule) Team Phone [phone]

## 2023-08-22 ENCOUNTER — Other Ambulatory Visit (HOSPITAL_COMMUNITY): Payer: Self-pay

## 2023-08-22 DIAGNOSIS — N179 Acute kidney failure, unspecified: Secondary | ICD-10-CM | POA: Diagnosis not present

## 2023-08-22 DIAGNOSIS — I4892 Unspecified atrial flutter: Secondary | ICD-10-CM | POA: Diagnosis not present

## 2023-08-22 DIAGNOSIS — I10 Essential (primary) hypertension: Secondary | ICD-10-CM | POA: Diagnosis not present

## 2023-08-22 DIAGNOSIS — I5033 Acute on chronic diastolic (congestive) heart failure: Secondary | ICD-10-CM | POA: Diagnosis not present

## 2023-08-22 LAB — COMPREHENSIVE METABOLIC PANEL WITH GFR
ALT: 24 U/L (ref 0–44)
AST: 189 U/L — ABNORMAL HIGH (ref 15–41)
Albumin: 1.7 g/dL — ABNORMAL LOW (ref 3.5–5.0)
Alkaline Phosphatase: 79 U/L (ref 38–126)
Anion gap: 4 — ABNORMAL LOW (ref 5–15)
BUN: 36 mg/dL — ABNORMAL HIGH (ref 6–20)
CO2: 28 mmol/L (ref 22–32)
Calcium: 8.3 mg/dL — ABNORMAL LOW (ref 8.9–10.3)
Chloride: 104 mmol/L (ref 98–111)
Creatinine, Ser: 0.98 mg/dL (ref 0.44–1.00)
GFR, Estimated: >60 mL/min (ref >60)
Glucose, Bld: 128 mg/dL — ABNORMAL HIGH (ref 70–99)
Potassium: 3.5 mmol/L (ref 3.5–5.1)
Sodium: 136 mmol/L (ref 135–145)
Total Bilirubin: 1.3 mg/dL — ABNORMAL HIGH (ref 0.0–1.2)
Total Protein: 8.5 g/dL — ABNORMAL HIGH (ref 6.5–8.1)

## 2023-08-22 LAB — CBC
HCT: 36.1 % (ref 36.0–46.0)
Hemoglobin: 11.1 g/dL — ABNORMAL LOW (ref 12.0–15.0)
MCH: 27.4 pg (ref 26.0–34.0)
MCHC: 30.7 g/dL (ref 30.0–36.0)
MCV: 89.1 fL (ref 80.0–100.0)
Platelets: 84 10*3/uL — ABNORMAL LOW (ref 150–400)
RBC: 4.05 MIL/uL (ref 3.87–5.11)
RDW: 18.7 % — ABNORMAL HIGH (ref 11.5–15.5)
WBC: 3.2 10*3/uL — ABNORMAL LOW (ref 4.0–10.5)
nRBC: 1.2 % — ABNORMAL HIGH (ref 0.0–0.2)

## 2023-08-22 LAB — COOXEMETRY PANEL
Carboxyhemoglobin: 2.9 % — ABNORMAL HIGH (ref 0.5–1.5)
Methemoglobin: 0.9 % (ref 0.0–1.5)
O2 Saturation: 82.4 %
Total hemoglobin: 11.8 g/dL — ABNORMAL LOW (ref 12.0–16.0)

## 2023-08-22 LAB — GLUCOSE, CAPILLARY
Glucose-Capillary: 101 mg/dL — ABNORMAL HIGH (ref 70–99)
Glucose-Capillary: 105 mg/dL — ABNORMAL HIGH (ref 70–99)
Glucose-Capillary: 129 mg/dL — ABNORMAL HIGH (ref 70–99)
Glucose-Capillary: 73 mg/dL (ref 70–99)
Glucose-Capillary: 75 mg/dL (ref 70–99)
Glucose-Capillary: 78 mg/dL (ref 70–99)

## 2023-08-22 LAB — AMMONIA: Ammonia: 21 umol/L (ref 9–35)

## 2023-08-22 MED ORDER — FUROSEMIDE 20 MG PO TABS
20.0000 mg | ORAL_TABLET | Freq: Every day | ORAL | 8 refills | Status: DC
  Filled 2023-08-22: qty 30, 30d supply, fill #0

## 2023-08-22 MED ORDER — MILRINONE LACTATE IN DEXTROSE 20-5 MG/100ML-% IV SOLN: 0.2500 ug/kg/min | INTRAVENOUS | Status: DC

## 2023-08-22 MED ORDER — AMIODARONE HCL 200 MG PO TABS
200.0000 mg | ORAL_TABLET | Freq: Every day | ORAL | 0 refills | Status: DC
  Filled 2023-08-22: qty 30, 30d supply, fill #0

## 2023-08-22 MED ORDER — POTASSIUM CHLORIDE CRYS ER 20 MEQ PO TBCR
40.0000 meq | EXTENDED_RELEASE_TABLET | Freq: Once | ORAL | Status: AC
  Administered 2023-08-22: 40 meq via ORAL
  Filled 2023-08-22: qty 2

## 2023-08-22 NOTE — Plan of Care (Signed)

## 2023-08-22 NOTE — TOC Progression Note (Signed)
 Transition of Care [facility]) - Progression Note    Patient Details  Name: [name] MRN: [id number] Date of Birth: 21-Jan-1966  Transition [facility] CM/SW Contact  Benjiman Bras, RN Phone Number:336 [phone] 08/22/2023, 2:24 PM  Clinical Narrative:     TOC CM spoke to pt and husband at bedside. Offered choice for Home Hospice with Home Milrinone . Pt states to all daughter, [name]. Contacted dtr and she is agreeable to [facility]. Medicare.gov list placed on chart.   Will need ambulance transport home.   Expected Discharge Plan: Home w Hospice Care Barriers to Discharge: Continued Medical Work up  Expected Discharge Plan and Services In-house Referral: Clinical Social Work Discharge Planning Services: CM Consult Post Acute Care Choice: Hospice Living arrangements for the past 2 months: Single Family Home                           HH Arranged: RN [facility] Agency: [facility] ([facility]) Date HH Agency Contacted: 08/22/23 Time HH Agency Contacted: 1423 Representative spoke with at [facility] Agency: Ardine Beckwith   Social Determinants of Health (SDOH) Interventions SDOH Screenings   Food Insecurity: No Food Insecurity (08/15/2023)  Housing: Low Risk  (08/15/2023)  Transportation Needs: No Transportation Needs (08/15/2023)  Utilities: Not At Risk (08/15/2023)  Alcohol Screen: Low Risk  (08/31/2021)  Depression (PHQ2-9): Low Risk  (08/31/2021)  Financial Resource Strain: Low Risk  (04/07/2023)   Received from [facility]  Physical Activity: Sufficiently Active (08/31/2021)  Recent Concern: Physical Activity - Inactive (07/07/2021)   Received from [facility], [facility]  Social Connections: Unknown (07/08/2022)   Received from [facility], [facility]  Stress: No Stress Concern Present (08/31/2021)  Tobacco Use: High Risk (08/15/2023)    Readmission Risk Interventions    08/15/2023    1:45 PM  Readmission Risk  Prevention Plan  Transportation Screening Complete  Home Care Screening Complete  Medication Review (RN CM) Complete

## 2023-08-22 NOTE — Discharge Summary (Signed)
 Physician Discharge Summary   Patient: [name] MRN: [id number] DOB: 27-Oct-1965  Admit date:     08/14/2023  Discharge date: 08/23/23  Discharge Physician: Albertus Alt   PCP: [facility]   Recommendations at discharge:    Plan to continue milrinone  at home under the care of hospice.  Follow up with family meeting on 08/23/23  Continue diuresis with furosemide  20 mg daily  Added amiodarone  for atrial fibrillation rate and rhythm control, not on full anticoagulation due to  low platelets.   Discharge Diagnoses: Principal Problem:   Acute on chronic diastolic CHF (congestive heart failure) (HCC) Active Problems:   AKI (acute kidney injury) (HCC)   Atrial flutter (HCC)   Essential hypertension   Acute metabolic encephalopathy   CVA (cerebral vascular accident) (HCC)   UTI (urinary tract infection)   Mixed hyperlipidemia   Acquired hypothyroidism   Class II obesity  Resolved Problems:   * No resolved hospital problems. [facility] Course: [name] was admitted to the hospital with the working diagnosis of heart failure exacerbation.   58 y.o. 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   Patient reports symptoms has been present for the last week prior to admission with orthopnea, dyspnea on exertion and lower extremity edema.  On her initial physical examination her blood pressure was 118/94, HR 112, RR 22 and 02 saturation 100% on supplemental 02 per [place]  Lungs with positive bilateral wheezing and decreased breath sounds, heart with S1 and S2 present and regular with no gallops, abdomen with no distention and positive lower extremity edema.   VBG 7,39/ 50/ <31/30/27.8%   Na 133, K 4.7 Cl 100,  bicarbonate 25 glucose 81, bun 39 cr 1.23 Mg 2.2  AST 936 ALT 346 total bilirubin 1.8  High sensitive troponin 117 and 119  BNP 1,054  Lactic acid 3,0  Wbc 2.9 hgb 13,8 plt 122  TSH 5.9  Sars covid 19 negative Influenza negative  RSV negative   Urine analsys SG 1,011, protein 100, positive nitrates, moderate leukocytes and moderate hgb, 21-50 wbc and 0-5 rbc  Urine culture positive for E coli > 100,000 CFU    Chest radiograph with left rotation, cardiomegaly, bilateral hilar vascular congestion with fluid in the right fissure.   EKG 107 bpm, left axis deviation, normal intervals, qtc 453, sinus rhythm with bi atrial enlargement, with no significant ST segment changes, negative T wave lead I and aVL.    Patient placed on IV ceftriaxone  for antibiotic therapy and with IV furosemide  for diuresis.  Noted low output failure and placed on inotropic support with milrinone .  05/16 Transferred to [facility]. Advanced heart failure consultation, determined patient end stage right heart failure.  05/18 continue very poor prognosis.  05/20 Palliative care has been consulted. Hospice meeting with family tomorrow. Possible home milrinone  under hospice services.   Assessment and Plan: * Acute on chronic diastolic CHF (congestive heart failure) (HCC) Echocardiogram with reduced mild  LV systolic function EF 40 to 45%, interventricular septum is flattened in systole and diastole, RV systolic function with severe reduction, RV with severe enlargement, TR mild to moderate, RVSP 46.9 mmHg,   Pulmonary hypertension  RV failure Acute on chronic core pulmonale.  Cardiogenic shock   No Documented urine output  Systolic blood pressure 95  to 100 mmHg range  SV02 82.4   Milrinone  for inotropic support 0.25 mcg/kg/min Midodrine  for blood pressure support 5, mg tid  05/17 furosemide  80 mg x1   Congested hepatopathy, ALT and AST are trending down to 189 and 24 respectively.   Very poor prognosis, end stage  heart failure. Limited therapy due to hypotension   AKI (acute kidney injury) (HCC) CKD stage 3a, hyperkalemia/ hypokalemia  hyponatremia.   Renal function with serum cr at 0,98 with K at 3,5 and serum bicarbonate at 28  Na 136   Continue milrinone  for inotropic support.  Add 40 meq today to aim for K of 4  Atrial flutter (HCC) Continue amiodarone  for rate control  Holding anticoagulation for now Positive thrombocytopenia   Essential hypertension Continue blood pressure support with midodrine    Acute metabolic encephalopathy Multifactorial, related to cardiogenic shock Clinically has resolved, likely patient is back to her baseline.  Cognitive impairment due to history of CVA  CVA (cerebral vascular accident) (HCC) Continue blood pressure control, on aspirin , no statin  Stage 2 pressure ulcer on sacrum present on admission.  Local wound care and air mattress.   UTI (urinary tract infection) Urine culture positive for  E Coli, sensitive to cephalosporins.  She has completed antibiotic therapy   No sepsis   Mixed hyperlipidemia Continue statin therapy   Acquired hypothyroidism Continue levothyroxine    Class II obesity Calculated BMI is 34.3        Consultants: cardiology and palliative care  Procedures performed: none   Disposition: Home Diet recommendation:  Cardiac diet DISCHARGE MEDICATION: Allergies as of 08/22/2023       Reactions   Lisinopril  Nausea Only        Medication List     STOP taking these medications    Humira  (2 Pen) 40 MG/0.4ML pen Generic drug: adalimumab        TAKE these medications    acetaminophen  500 MG tablet Commonly known as: TYLENOL  Take 1,000 mg by mouth every 6 (six) hours as needed for mild pain.   amiodarone  200 MG tablet Commonly known as: PACERONE  Take 1 tablet (200 mg total) by mouth daily. Start taking on: Aug 23, 2023   aspirin  EC 81 MG tablet Take 81 mg by mouth daily. Swallow whole.   furosemide   20 MG tablet Commonly known as: LASIX  Take 1 tablet (20 mg total) by mouth daily. What changed: when to take this   levothyroxine  50 MCG tablet Commonly known as: SYNTHROID  Take 50 mcg by mouth daily before breakfast.   midodrine  2.5 MG tablet Commonly known as: PROAMATINE  Take 1 tablet (2.5 mg total) by mouth 3 (three) times daily with meals.   milrinone  20 MG/100 ML Soln infusion Commonly known as: PRIMACOR  Inject 0.0239 mg/min into the vein continuous.   traZODone 100 MG tablet Commonly known as: DESYREL Take 100 mg by mouth at bedtime.        Follow-up Information     [facility] Follow up on 09/18/2023.   Specialty: Cardiology Why: at 3:00 pm Arlin Benes, Entrance C Contact information: [address] [place]  [phone]               Discharge Exam: Filed Weights   08/20/23 0500 08/21/23 0300 08/22/23 0400  Weight: 113.4 kg 115.7 kg 111.6 kg   BP 117/87 (BP Location: Left Arm)   Pulse 91   Temp 97.6 F (36.4 C) (Oral)   Resp  17   Ht 5\' 6"  (1.676 m)   Wt 111.6 kg   LMP 07/29/2012   SpO2 97%   BMI 39.71 kg/m   Patient very weak and deconditioned, not chest pain, and no dyspnea, intermittent leg cramps.   Neurology awake and alert. Positive left sided weakness, chronic ENT with mild pallor with no icterus Cardiovascular with S1 and S2 present and regular with no gallops or rubs. Positive systolic murmur at the right lower sternal border.  Respiratory with poor inspiratory effort with mild rales at the dependent zones with no wheezing or rhonchi  Abdomen with no distention  No lower extremity edema   Condition at discharge: stable  The results of significant diagnostics from this hospitalization (including imaging, microbiology, ancillary and laboratory) are listed below for reference.   Imaging Studies: ECHOCARDIOGRAM LIMITED Result Date: 08/17/2023    ECHOCARDIOGRAM  LIMITED REPORT   Patient Name:   RECHELLE Castagna Date of Exam: 08/17/2023 Medical Rec #:  [id number]    Height:       66.0 in Accession #:    [id number]   Weight:       213.6 lb Date of Birth:  Sep 08, 1965    BSA:          2.057 m Patient Age:    58 years     BP:           112/72 mmHg Patient Gender: F            HR:           83 bpm. Exam Location:  [facility] Procedure: Limited Echo, Limited Color Doppler and Cardiac Doppler (Both            Spectral and Color Flow Doppler were utilized during procedure). Indications:    Dyspnea R06.00  History:        Patient has prior history of Echocardiogram examinations, most                 recent 07/11/2023. CHF, Stroke; Risk Factors:Hypertension and                 Dyslipidemia.  Sonographer:    Denese Finn RCS Referring Phys: [id number] Joyceann No BRANCH IMPRESSIONS  1. Left ventricular ejection fraction, by estimation, is 40 to 45%. The left ventricle has low normal function. Left ventricular endocardial border not optimally defined to evaluate regional wall motion. There is the interventricular septum is flattened  in systole and diastole, consistent with right ventricular pressure and volume overload.  2. Right ventricular systolic function is severely reduced. The right ventricular size is severely enlarged. There is moderately elevated pulmonary artery systolic pressure.  3. The tricuspid valve is abnormal. Tricuspid valve regurgitation is mild to moderate.  4. The inferior vena cava is normal in size with <50% respiratory variability, suggesting right atrial pressure of 8 mmHg.  5. Limited echo to evaluate LV and RV function FINDINGS  Left Ventricle: Left ventricular ejection fraction, by estimation, is 40 to 45%. The left ventricle has low normal function. Left ventricular endocardial border not optimally defined to evaluate regional wall motion. The interventricular septum is flattened in systole and diastole, consistent with right ventricular pressure and volume overload.  Right Ventricle: The right ventricular size is severely enlarged. Right ventricular systolic function is severely reduced. There is moderately elevated pulmonary artery systolic pressure. The tricuspid regurgitant velocity is 3.12 m/s, and with an assumed right atrial pressure of 8 mmHg, the estimated right ventricular systolic pressure  is 46.9 mmHg. Tricuspid Valve: The tricuspid valve is abnormal. Tricuspid valve regurgitation is mild to moderate. No evidence of tricuspid stenosis. Venous: The inferior vena cava is normal in size with less than 50% respiratory variability, suggesting right atrial pressure of 8 mmHg. LEFT VENTRICLE PLAX 2D LVIDd:         4.40 cm LVIDs:         3.50 cm LV PW:         1.20 cm LV IVS:        1.10 cm LVOT diam:     2.20 cm LVOT Area:     3.80 cm  LV Volumes (MOD) LV vol d, MOD A2C: 88.7 ml LV vol d, MOD A4C: 76.5 ml LV vol s, MOD A2C: 49.3 ml LV vol s, MOD A4C: 44.9 ml LV SV MOD A2C:     39.4 ml LV SV MOD A4C:     76.5 ml LV SV MOD BP:      37.8 ml RIGHT VENTRICLE TAPSE (M-mode): 1.5 cm LEFT ATRIUM         Index       RIGHT ATRIUM           Index LA diam:    3.40 cm 1.65 cm/m  RA Area:     31.30 cm                                 RA Volume:   131.00 ml 63.70 ml/m   AORTA Ao Root diam: 3.30 cm TRICUSPID VALVE TR Peak grad:   38.9 mmHg TR Vmax:        312.00 cm/s  SHUNTS Systemic Diam: 2.20 cm Armida Lander MD Electronically signed by Armida Lander MD Signature Date/Time: 08/17/2023/2:28:34 PM    Final    DG CHEST PORT 1 VIEW Result Date: 08/16/2023 CLINICAL DATA:  Cough with weakness and shortness of breath. EXAM: PORTABLE CHEST 1 VIEW COMPARISON:  08/15/2023. FINDINGS: Leftward patient rotation. The cardio pericardial silhouette is enlarged. Possible retrocardiac atelectasis or infiltrate. Right lung clear. No acute bony abnormality. Telemetry leads overlie the chest. IMPRESSION: Rotated film with cardiomegaly. Possible retrocardiac left base atelectasis or infiltrate.  Electronically Signed   By: Donnal Fusi M.D.   On: 08/16/2023 06:39   US  EKG SITE RITE Result Date: 08/15/2023 If Site Rite image not attached, placement could not be confirmed due to current cardiac rhythm.  DG Chest Port 1 View Result Date: 08/15/2023 CLINICAL DATA:  Shortness of breath with loss of appetite and diarrhea. EXAM: PORTABLE CHEST 1 VIEW COMPARISON:  July 14, 2023 FINDINGS: The cardiac silhouette is mildly enlarged and unchanged in size. There is no evidence of acute infiltrate, pleural effusion or pneumothorax. The visualized skeletal structures are unremarkable. IMPRESSION: No active cardiopulmonary disease. Electronically Signed   By: Virgle Grime M.D.   On: 08/15/2023 00:30    Microbiology: Results for orders placed or performed during the hospital encounter of 08/14/23  Urine Culture     Status: Abnormal   Collection Time: 08/14/23 11:55 PM   Specimen: Urine, Clean Catch  Result Value Ref Range Status   Specimen Description   Final    URINE, CLEAN CATCH Performed at [facility], [address], [place], [place] [address]    Special Requests   Final    NONE Performed at [facility], [address], [place], [place] [address]    Culture >=100,000 COLONIES/mL  ESCHERICHIA COLI (A)  Final   Report Status 08/17/2023 FINAL  Final   Organism ID, Bacteria ESCHERICHIA COLI (A)  Final      Susceptibility   Escherichia coli - MIC*    AMPICILLIN <=2 SENSITIVE Sensitive     CEFAZOLIN  <=4 SENSITIVE Sensitive     CEFEPIME <=0.12 SENSITIVE Sensitive     CEFTRIAXONE  <=0.25 SENSITIVE Sensitive     CIPROFLOXACIN <=0.25 SENSITIVE Sensitive     GENTAMICIN <=1 SENSITIVE Sensitive     IMIPENEM <=0.25 SENSITIVE Sensitive     NITROFURANTOIN <=16 SENSITIVE Sensitive     TRIMETH/SULFA <=20 SENSITIVE Sensitive     AMPICILLIN/SULBACTAM <=2 SENSITIVE Sensitive     PIP/TAZO <=4 SENSITIVE Sensitive ug/mL    * >=100,000 COLONIES/mL ESCHERICHIA COLI  Resp panel by RT-PCR (RSV, Flu  A&B, Covid) Anterior Nasal Swab     Status: None   Collection Time: 08/15/23 12:09 AM   Specimen: Anterior Nasal Swab  Result Value Ref Range Status   SARS Coronavirus 2 by RT PCR NEGATIVE NEGATIVE Final    Comment: (NOTE) SARS-CoV-2 target nucleic acids are NOT DETECTED.  The SARS-CoV-2 RNA is generally detectable in upper respiratory specimens during the acute phase of infection. The lowest concentration of SARS-CoV-2 viral copies this assay can detect is 138 copies/mL. A negative result does not preclude SARS-Cov-2 infection and should not be used as the sole basis for treatment or other patient management decisions. A negative result may occur with  improper specimen collection/handling, submission of specimen other than nasopharyngeal swab, presence of viral mutation(s) within the areas targeted by this assay, and inadequate number of viral copies(<138 copies/mL). A negative result must be combined with clinical observations, patient history, and epidemiological information. The expected result is Negative.  Fact Sheet for Patients:  BloggerCourse.com  Fact Sheet for Healthcare Providers:  SeriousBroker.it  This test is no t yet approved or cleared by the [place]  FDA and  has been authorized for detection and/or diagnosis of SARS-CoV-2 by FDA under an Emergency Use Authorization (EUA). This EUA will remain  in effect (meaning this test can be used) for the duration of the COVID-19 declaration under Section 564(b)(1) of the Act, 21 U.S.C.section 360bbb-3(b)(1), unless the authorization is terminated  or revoked sooner.       Influenza A by PCR NEGATIVE NEGATIVE Final   Influenza B by PCR NEGATIVE NEGATIVE Final    Comment: (NOTE) The Xpert Xpress SARS-CoV-2/FLU/RSV plus assay is intended as an aid in the diagnosis of influenza from Nasopharyngeal swab specimens and should not be used as a sole basis for treatment.  Nasal washings and aspirates are unacceptable for Xpert Xpress SARS-CoV-2/FLU/RSV testing.  Fact Sheet for Patients: BloggerCourse.com  Fact Sheet for Healthcare Providers: SeriousBroker.it  This test is not yet approved or cleared by the [place]  FDA and has been authorized for detection and/or diagnosis of SARS-CoV-2 by FDA under an Emergency Use Authorization (EUA). This EUA will remain in effect (meaning this test can be used) for the duration of the COVID-19 declaration under Section 564(b)(1) of the Act, 21 U.S.C. section 360bbb-3(b)(1), unless the authorization is terminated or revoked.     Resp Syncytial Virus by PCR NEGATIVE NEGATIVE Final    Comment: (NOTE) Fact Sheet for Patients: BloggerCourse.com  Fact Sheet for Healthcare Providers: SeriousBroker.it  This test is not yet approved or cleared by the [place]  FDA and has been authorized for detection and/or diagnosis of SARS-CoV-2 by FDA under an Emergency Use Authorization (EUA).  This EUA will remain in effect (meaning this test can be used) for the duration of the COVID-19 declaration under Section 564(b)(1) of the Act, 21 U.S.C. section 360bbb-3(b)(1), unless the authorization is terminated or revoked.  Performed at [facility], [address], [place], [place] [address]   MRSA Next Gen by PCR, Nasal     Status: None   Collection Time: 08/15/23  5:09 PM   Specimen: Nasal Mucosa; Nasal Swab  Result Value Ref Range Status   MRSA by PCR Next Gen NOT DETECTED NOT DETECTED Final    Comment: (NOTE) The GeneXpert MRSA Assay (FDA approved for NASAL specimens only), is one component of a comprehensive MRSA colonization surveillance program. It is not intended to diagnose MRSA infection nor to guide or monitor treatment for MRSA infections. Test performance is not FDA approved in patients less than 71  years old. Performed at [facility], [address], [place], [place] [address]   Culture, blood (Routine X 2) w Reflex to ID Panel     Status: None   Collection Time: 08/16/23  6:44 PM   Specimen: BLOOD  Result Value Ref Range Status   Specimen Description BLOOD BLOOD RIGHT HAND  Final   Special Requests   Final    BOTTLES DRAWN AEROBIC ONLY Blood Culture results may not be optimal due to an inadequate volume of blood received in culture bottles   Culture   Final    NO GROWTH 5 DAYS Performed at [facility], [address], [place], [place] [address]    Report Status 08/21/2023 FINAL  Final  Culture, blood (Routine X 2) w Reflex to ID Panel     Status: None   Collection Time: 08/16/23  6:44 PM   Specimen: BLOOD  Result Value Ref Range Status   Specimen Description BLOOD BLOOD LEFT HAND  Final   Special Requests   Final    BOTTLES DRAWN AEROBIC ONLY Blood Culture results may not be optimal due to an inadequate volume of blood received in culture bottles   Culture   Final    NO GROWTH 5 DAYS Performed at [facility], [address] [place], [place] [address]    Report Status 08/21/2023 FINAL  Final    Labs: CBC: Recent Labs  Lab 08/18/23 0846 08/19/23 0500 08/20/23 0440 08/21/23 0350 08/22/23 0500  WBC 5.7 4.0 3.2* 4.4 3.2*  HGB 11.5* 11.5* 11.5* 12.1 11.1*  HCT 35.6* 34.9* 35.4* 37.6 36.1  MCV 86.2 86.2 86.1 88.3 89.1  PLT 73* 79* 79* 86* 84*   Basic Metabolic Panel: Recent Labs  Lab 08/17/23 0438 08/18/23 0505 08/18/23 0846 08/19/23 0500 08/20/23 0440 08/21/23 0350 08/22/23 0500  NA 130*   < > 133* 129* 132* 137 136  K 3.9   < > 3.7 3.3* 3.5 4.0 3.5  CL 92*   < > 99 95* 98 103 104  CO2 25   < > 27 27 27 27 28   GLUCOSE 329*   < > 117* 243* 189* 84 128*  BUN 67*   < > 70* 62* 54* 44* 36*  CREATININE 2.19*   < > 2.15* 1.69* 1.43* 1.17* 0.98  CALCIUM  7.9*   < > 8.4* 8.0* 8.4* 8.7* 8.3*  MG 2.3  --   --   --   --   --   --    < > = values in this interval  not displayed.   Liver Function Tests: Recent Labs  Lab 08/18/23 [phone]  08/19/23 0500 08/20/23 0440 08/21/23 0350 08/22/23 0500  AST 1,283* 844* 492* 311* 189*  ALT 513* 245* 97* 40 24  ALKPHOS 98 92 88 88 79  BILITOT 1.7* 1.3* 1.5* 1.6* 1.3*  PROT 9.9* 9.4* 9.5* 9.8* 8.5*  ALBUMIN  2.0* 1.9* 1.9* 1.9* 1.7*   CBG: Recent Labs  Lab 08/21/23 2359 08/22/23 0434 08/22/23 0800 08/22/23 1146 08/22/23 1544  GLUCAP 78 129* 75 105* 73    Discharge time spent: greater than 30 minutes.  Signed: Albertus Alt, MD [facility] 08/22/2023

## 2023-08-22 NOTE — Progress Notes (Addendum)
 Advanced Heart Failure Rounding Note  Cardiologist: Ola Berger, MD  Chief Complaint: Acute on chronic RV failure/Cardiogenic shock Subjective:    Coox 82%. CVP remains <5. On 0.25 milrinone . Weights inaccurate Cr improving with inotrope support 1.17>0.98.    Objective:    Weight Range: 111.6 kg Body mass index is 39.71 kg/m.   Vital Signs:   Temp:  [97.2 F (36.2 C)-97.8 F (36.6 C)] 97.8 F (36.6 C) (05/20 0757) Pulse Rate:  [92-95] 95 (05/20 0757) Resp:  [12-25] 19 (05/20 0757) BP: (109-121)/(73-90) 110/81 (05/20 0757) SpO2:  [93 %-100 %] 98 % (05/20 0757) Weight:  [111.6 kg] 111.6 kg (05/20 0400) Last BM Date : 08/21/23  Weight change: Filed Weights   08/20/23 0500 08/21/23 0300 08/22/23 0400  Weight: 113.4 kg 115.7 kg 111.6 kg   Intake/Output:  Intake/Output Summary (Last 24 hours) at 08/22/2023 0848 Last data filed at 08/22/2023 0603 Gross per 24 hour  Intake 375.71 ml  Output 700 ml  Net -324.29 ml    Physical Exam    CVP 6-7 General: Chronically-ill appearing.  Cardiac: JVP difficult to assess. S1 and S2 present. No murmurs or rub. Extremities: Warm and dry. No peripheral edema.  Neuro: Confused, garbled speech  Telemetry   SR in 90s (personally reviewed)  Labs    CBC Recent Labs    08/21/23 0350 08/22/23 0500  WBC 4.4 3.2*  HGB 12.1 11.1*  HCT 37.6 36.1  MCV 88.3 89.1  PLT 86* 84*   Basic Metabolic Panel Recent Labs    32/44/01 0350 08/22/23 0500  NA 137 136  K 4.0 3.5  CL 103 104  CO2 27 28  GLUCOSE 84 128*  BUN 44* 36*  CREATININE 1.17* 0.98  CALCIUM  8.7* 8.3*   Liver Function Tests Recent Labs    08/21/23 0350 08/22/23 0500  AST 311* 189*  ALT 40 24  ALKPHOS 88 79  BILITOT 1.6* 1.3*  PROT 9.8* 8.5*  ALBUMIN  1.9* 1.7*   BNP (last 3 results) Recent Labs    07/09/23 1320 08/14/23 2341  BNP 566.0* 1,054.0*   Imaging   No results found.  Medications:    Scheduled Medications:  amiodarone   200 mg Oral  Daily   aspirin  EC  81 mg Oral Daily   budesonide  (PULMICORT ) nebulizer solution  0.5 mg Nebulization BID   Chlorhexidine  Gluconate Cloth  6 each Topical Q0600   Gerhardt's butt cream   Topical BID   lactulose   30 g Oral BID   levothyroxine   50 mcg Oral QAC breakfast   midodrine   5 mg Oral TID WC   pantoprazole   40 mg Oral Daily   potassium chloride   40 mEq Oral Once   sodium chloride  flush  10-40 mL Intracatheter Q12H   sodium chloride  flush  3 mL Intravenous Q12H   Infusions:  milrinone  0.25 mcg/kg/min (08/21/23 1840)   PRN Medications: acetaminophen  **OR** acetaminophen , ipratropium-albuterol , prochlorperazine , sodium chloride  flush, sodium chloride  flush  Patient Profile   58 y.o. with a history of CVA w/ residual left hemiparesis, HTN, HLD, anemia, seropositive rheumatoid arthritis, and hypothyroidism and recently diagnosed w/ severe RV failure and PH. Admitted for acute on chronic RV failure with Cardiogenic shock.   Assessment/Plan   1. 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 - V/Q scan negative. Pt declined PFT evaluation  - Failed Sildenafil  due to hypotension. Started on Midodrine   - initially CGS w/ lactic acidosis, shock liver and AKI. LA 4.7, Co-ox 47%. Improving on Milrinone  0.25.  - continue milrinone  support. CVP only 3. - continue midodrine  5 mg tid for BP support  - she is end-stage. Has been essentially bedbound since last admission. There are no durable solutions for her.  - again long discussion by Dr. Julane Ny yesterday with family on phone and husband at the bedside. She has end-stage RV failure w/ cardiogenic shock and MSOF. Overall poor prognosis. Improving with milrinone  but no long-term options. After multiple discussions with family she currently remains full code.  Daughter has requested that Palliative not follow the patient at this time.   2. Aflutter vs atrial tachycardia - Noted intermittently at [facility] on 5/14. Initially started on amiodarone  gtt and later stopped d/t hypotension. - She was not anticoagulated d/t INR > 2 in setting of liver disease - In SR with 1AVB - ?role of anticoagulation.    3. Shock Liver - improving with inotrope support. continue - NH3 elevated at 50>lactulose >21   3. AKI on CKD IIIa - SCr 1.2>>1.9 (b/l 0.6) - CG shock management per above - support BP w/ midodrine  - sCr down to 0.98.   4. Hyperkalemia - resolved with Lokelma    5. Seropositive Rheumatoid Arthritis  - on Humira  and methotrexate  as outpatient  - Followed by Dr. Rodell Citrin   6. Hx CVA - Weelchair bound, some aphasia - On aspirin . Hold off on restarting statin with liver dysfunction  Length of Stay: 7  [place] Lee, NP  08/22/2023, 8:48 AM  Advanced Heart Failure Team Pager [phone] (M-F; 7a - 5p)  Please contact [facility] for night-coverage after hours (5p -7a ) and weekends on [email or web address]  Patient seen and examined with the above-signed Advanced Practice Provider and/or Housestaff. I personally reviewed laboratory data, imaging studies and relevant notes. I independently examined the patient and formulated the important aspects of the plan. I have edited the note to reflect any of my changes or salient points. I have personally discussed the plan with the patient and/or family.  Remains on milrinone . More alert to day but still extremely weak. Unable to get out of bed. Breathing ok Co-ox 82% Renal function has normalized  General:  Elderly Chronically ill appearingNo resp difficulty HEENT: normal Neck:supple JVP flat Cor:RRR 2/6 TR Lungs: clear Abdomen: soft, nontender, nondistended. No hepatosplenomegaly. No bruits or masses. Good bowel sounds. Extremities: no cyanosis, clubbing, rash, edema Neuro: awake confused at times very weak. Plegic on  left  Continues on milrinone  for end-stage RV failure. Family has met with Palliative Care and per report daughter very adamant on continuing milrinone  and having her remain full code  SW and Case management looking into whether medicaid will pay for milrinone . If not, Hospice might cover it but would have to be DNR.  I have stressed that benefit of milrinone  likely to be transient (weeks to months).   They will continue to work through options with SW/CM and PC.   HF team will see again on Thursday. Please call if questions  Jules Oar, MD  1:51 PM

## 2023-08-23 ENCOUNTER — Other Ambulatory Visit (HOSPITAL_COMMUNITY): Payer: Self-pay

## 2023-08-23 LAB — CBC
HCT: 34.9 % — ABNORMAL LOW (ref 36.0–46.0)
Hemoglobin: 11.1 g/dL — ABNORMAL LOW (ref 12.0–15.0)
MCH: 28 pg (ref 26.0–34.0)
MCHC: 31.8 g/dL (ref 30.0–36.0)
MCV: 88.1 fL (ref 80.0–100.0)
Platelets: 88 10*3/uL — ABNORMAL LOW (ref 150–400)
RBC: 3.96 MIL/uL (ref 3.87–5.11)
RDW: 19 % — ABNORMAL HIGH (ref 11.5–15.5)
WBC: 2.9 10*3/uL — ABNORMAL LOW (ref 4.0–10.5)
nRBC: 0 % (ref 0.0–0.2)

## 2023-08-23 LAB — GLUCOSE, CAPILLARY
Glucose-Capillary: 101 mg/dL — ABNORMAL HIGH (ref 70–99)
Glucose-Capillary: 108 mg/dL — ABNORMAL HIGH (ref 70–99)
Glucose-Capillary: 116 mg/dL — ABNORMAL HIGH (ref 70–99)
Glucose-Capillary: 50 mg/dL — ABNORMAL LOW (ref 70–99)
Glucose-Capillary: 71 mg/dL (ref 70–99)
Glucose-Capillary: 77 mg/dL (ref 70–99)
Glucose-Capillary: 81 mg/dL (ref 70–99)
Glucose-Capillary: 84 mg/dL (ref 70–99)
Glucose-Capillary: 89 mg/dL (ref 70–99)
Glucose-Capillary: 93 mg/dL (ref 70–99)
Glucose-Capillary: 94 mg/dL (ref 70–99)

## 2023-08-23 LAB — COOXEMETRY PANEL
Carboxyhemoglobin: 3.6 % — ABNORMAL HIGH (ref 0.5–1.5)
Methemoglobin: 0.9 % (ref 0.0–1.5)
O2 Saturation: 65.7 %
Total hemoglobin: 11.7 g/dL — ABNORMAL LOW (ref 12.0–16.0)

## 2023-08-23 MED ORDER — DEXTROSE 50 % IV SOLN
25.0000 g | INTRAVENOUS | Status: AC
  Administered 2023-08-23: 25 g via INTRAVENOUS
  Filled 2023-08-23: qty 50

## 2023-08-23 NOTE — Progress Notes (Signed)
 Hypoglycemic Event  CBG: 50   Time: 0803  Treatment: D50 50 mL (25 gm)  Symptoms: None  Follow-up CBG: Time:0825 CBG Result:89  Possible Reasons for Event: Inadequate meal intake  Comments/MD notified:Udora - charge RN notified MD. No new orders.    Meridith Stanford

## 2023-08-23 NOTE — Progress Notes (Signed)
 Patient seen and examined, reviewed DC summary as completed yesterday by Dr. Sunnie England, briefly 58/F with end-stage right heart failure, pulmonary hypertension, cardiogenic shock, CVA, bedbound, heart failure team following, remains on milrinone , seen by palliative care, daughter remains adamant on continuing milrinone , full code. - Social work and case management looking at payer sources for milrinone  - CBG of 50 early this morning, now 78, 89-p.o. intake is poor to fair, suspect she may have depleted her glycogen stores from right heart failure causing liver disease, monitor for now  Deforest Fast, MD

## 2023-08-23 NOTE — Plan of Care (Signed)
  Problem: Education: Goal: Knowledge of General Education information will improve Description: Including pain rating scale, medication(s)/side effects and non-pharmacologic comfort measures Outcome: Progressing   Problem: Clinical Measurements: Goal: Will remain free from infection Outcome: Progressing Goal: Diagnostic test results will improve Outcome: Progressing Goal: Respiratory complications will improve Outcome: Progressing Goal: Cardiovascular complication will be avoided Outcome: Progressing   Problem: Elimination: Goal: Will not experience complications related to bowel motility Outcome: Progressing Goal: Will not experience complications related to urinary retention Outcome: Progressing   Problem: Safety: Goal: Ability to remain free from injury will improve Outcome: Progressing   Problem: Skin Integrity: Goal: Risk for impaired skin integrity will decrease Outcome: Progressing   Problem: Cardiac: Goal: Ability to achieve and maintain adequate cardiopulmonary perfusion will improve Outcome: Progressing   Problem: Cardiac: Goal: Ability to achieve and maintain adequate cardiopulmonary perfusion will improve Outcome: Progressing   Problem: Health Behavior/Discharge Planning: Goal: Ability to manage health-related needs will improve Outcome: Not Progressing   Problem: Clinical Measurements: Goal: Ability to maintain clinical measurements within normal limits will improve Outcome: Not Progressing   Problem: Activity: Goal: Risk for activity intolerance will decrease Outcome: Not Progressing   Problem: Nutrition: Goal: Adequate nutrition will be maintained Outcome: Not Progressing   Problem: Coping: Goal: Level of anxiety will decrease Outcome: Not Progressing   Problem: Pain Managment: Goal: General experience of comfort will improve and/or be controlled Outcome: Not Progressing   Problem: Education: Goal: Ability to demonstrate management of  disease process will improve Outcome: Not Progressing Goal: Ability to verbalize understanding of medication therapies will improve Outcome: Not Progressing   Problem: Activity: Goal: Capacity to carry out activities will improve Outcome: Not Progressing   Problem: Education: Goal: Ability to demonstrate management of disease process will improve Outcome: Not Progressing Goal: Ability to verbalize understanding of medication therapies will improve Outcome: Not Progressing   Problem: Activity: Goal: Capacity to carry out activities will improve Outcome: Not Progressing

## 2023-08-23 NOTE — Progress Notes (Signed)
 Patients daughter requesting podiatry to be consulted prior to patient discharging home. MD Deforest Fast notified of request.

## 2023-08-23 NOTE — Progress Notes (Signed)
 MD Deforest Fast notified of continuous low blood sugars.

## 2023-08-23 NOTE — Plan of Care (Signed)
  Problem: Elimination: Goal: Will not experience complications related to urinary retention Outcome: Progressing   Problem: Pain Managment: Goal: General experience of comfort will improve and/or be controlled Outcome: Progressing   Problem: Education: Goal: Ability to verbalize understanding of medication therapies will improve Outcome: Not Progressing   Problem: Cardiac: Goal: Ability to achieve and maintain adequate cardiopulmonary perfusion will improve Outcome: Progressing

## 2023-08-23 NOTE — Progress Notes (Signed)
 This chaplain responded to PMT NP-Alicia consult for spiritual care for the Pt. and spouse. The Pt. appears to be resting comfortably at the time of the visit. The Pt. will intermittently open her eyes and raise her head to her spoken name.  The Pt. husband-[name] is at the bedside and agrees to a spiritual care visit. The chaplain listened reflectively to [name]' story of their relationship. The chaplain heard [name] regret and anticipatory grief in the setting of the Pt. prognosis.   The Pt. faith and love for the Pt. is affirmed by the chaplain in the storytelling. [name] identifies himself as a person the Pt. trusts. The chaplain invites [name] to begin thinking about quality of life for the Pt. and what gives the Pt. life meaning. [name] mentions the Pt. desire to go home and the Pt. granddaughter. [name] identifies his brother and daughter as sources of support.  The chaplain understands [name] is interested in an additional person's presence during conversations with the medical team. [name] accepted the chaplain's invitation for prayer and F/U spiritual care.  Chaplain Kathleene Papas [phone]

## 2023-08-23 NOTE — Progress Notes (Signed)
 [place] [id number] - [facility] Liaison Note:  Received referral for home hospice with milrinone  infusion. Hospital liaison met with patient and family at bedside and discussed hospice philosophy, care and goals.   [facility] administration has reviewed this referral for home hospice services to include milrinone  infusion including inpatient chart review.  At this time it is felt that the family is not agreeable to hospice philosophy and goals but will accept hospice services in order to obtain milrinone . After further discussion with daughter, she confirms that they do not want hospice and that the only reason hospice was considered was because they were informed by the case management department that hospice is the only way [name] could receive milrinone  at home.   We have been informed that [employer] coverage would be a payor source for milrinone  "if medically indicated".   We are happy to provide home based primary care services if needed after an infusion company has confirmed.   Discussed with patient's daughter that we are requesting the case management department to explore other payor sources for milrinone  in effort to connect her mother with the correct services.   Please feel free to reach out to hospital liaisons for any other questions or needs.   [name], BSN, RN, OCN [employer] [phone]

## 2023-08-24 ENCOUNTER — Other Ambulatory Visit (HOSPITAL_COMMUNITY): Payer: Self-pay

## 2023-08-24 DIAGNOSIS — I5033 Acute on chronic diastolic (congestive) heart failure: Secondary | ICD-10-CM | POA: Diagnosis not present

## 2023-08-24 LAB — CBC
HCT: 34.6 % — ABNORMAL LOW (ref 36.0–46.0)
Hemoglobin: 11 g/dL — ABNORMAL LOW (ref 12.0–15.0)
MCH: 28.2 pg (ref 26.0–34.0)
MCHC: 31.8 g/dL (ref 30.0–36.0)
MCV: 88.7 fL (ref 80.0–100.0)
Platelets: 85 10*3/uL — ABNORMAL LOW (ref 150–400)
RBC: 3.9 MIL/uL (ref 3.87–5.11)
RDW: 18.9 % — ABNORMAL HIGH (ref 11.5–15.5)
WBC: 2.2 10*3/uL — ABNORMAL LOW (ref 4.0–10.5)
nRBC: 0 % (ref 0.0–0.2)

## 2023-08-24 LAB — GLUCOSE, CAPILLARY
Glucose-Capillary: 79 mg/dL (ref 70–99)
Glucose-Capillary: 59 mg/dL — ABNORMAL LOW (ref 70–99)
Glucose-Capillary: 80 mg/dL (ref 70–99)
Glucose-Capillary: 74 mg/dL (ref 70–99)

## 2023-08-24 LAB — COMPREHENSIVE METABOLIC PANEL WITH GFR
ALT: 21 U/L (ref 0–44)
AST: 116 U/L — ABNORMAL HIGH (ref 15–41)
Albumin: 1.9 g/dL — ABNORMAL LOW (ref 3.5–5.0)
Alkaline Phosphatase: 64 U/L (ref 38–126)
Anion gap: 4 — ABNORMAL LOW (ref 5–15)
BUN: 19 mg/dL (ref 6–20)
CO2: 27 mmol/L (ref 22–32)
Calcium: 8.3 mg/dL — ABNORMAL LOW (ref 8.9–10.3)
Chloride: 105 mmol/L (ref 98–111)
Creatinine, Ser: 0.68 mg/dL (ref 0.44–1.00)
GFR, Estimated: >60 mL/min (ref >60)
Glucose, Bld: 98 mg/dL (ref 70–99)
Potassium: 3.7 mmol/L (ref 3.5–5.1)
Sodium: 136 mmol/L (ref 135–145)
Total Bilirubin: 1 mg/dL (ref 0.0–1.2)
Total Protein: 8.4 g/dL — ABNORMAL HIGH (ref 6.5–8.1)

## 2023-08-24 LAB — COOXEMETRY PANEL
Carboxyhemoglobin: 2.9 % — ABNORMAL HIGH (ref 0.5–1.5)
Methemoglobin: 1.1 % (ref 0.0–1.5)
O2 Saturation: 68.3 %
Total hemoglobin: 11.8 g/dL — ABNORMAL LOW (ref 12.0–16.0)

## 2023-08-24 MED ORDER — ENSURE ENLIVE PO LIQD: 237.0000 mL | Freq: Every day | ORAL | Status: DC

## 2023-08-24 MED ORDER — FUROSEMIDE 40 MG PO TABS
40.0000 mg | ORAL_TABLET | Freq: Every day | ORAL | Status: DC
  Administered 2023-08-25 – 2023-08-26 (×2): 40 mg via ORAL
  Filled 2023-08-24 (×3): qty 1

## 2023-08-24 MED ORDER — POTASSIUM CHLORIDE CRYS ER 20 MEQ PO TBCR
40.0000 meq | EXTENDED_RELEASE_TABLET | Freq: Two times a day (BID) | ORAL | Status: AC
  Administered 2023-08-24: 40 meq via ORAL
  Filled 2023-08-24 (×2): qty 2

## 2023-08-24 NOTE — Progress Notes (Addendum)
 Patient has been refusing her medications since 10am. Her husband returned to bedside a few minutes ago and he begged for her to take her medication.  She swatted him away and said she was "tired of taking pills,  getting her finger stuck, and tired of everything." She said does not care if she gets better, nurse explained medications were to help her heart again, husband left the room and she said that we all die.   Md notified of all refusals.

## 2023-08-24 NOTE — Plan of Care (Signed)
  Problem: Health Behavior/Discharge Planning: Goal: Ability to manage health-related needs will improve Note: Patient refusing medications, saying she is tired of all of it

## 2023-08-24 NOTE — Progress Notes (Signed)
 PROGRESS NOTE    [name]  ZOX:[id number] DOB: 1966-01-26 DOA: 08/14/2023 PCP: [facility]  58 y.o. with a history of CVA w/ residual left hemiparesis, HTN, HLD, anemia, seropositive rheumatoid arthritis, and hypothyroidism and recently diagnosed w/ severe RV failure and PH. Admitted for acute on chronic RV failure with Cardiogenic shock.,  Also treated for E. coli UTI -Admitted, started on diuretics, ceftriaxone  - Transferred to [facility], started on milrinone  for low output, end-stage RV failure - Poor prognosis, seen by palliative care consultation, patient and family declined hospice/palliative care   Subjective: -Feels so-so, episode of hypoglycemia early a.m.  Assessment and Plan:  End-stage RV failure with cardiogenic shock Pulmonary hypertension Acute on chronic diastolic CHF -Echo noted EF 50-55%, severely reduced RV -Admitted with low output state, lactic acidosis, shock liver and AKI, stabilized on milrinone  -Overall poor prognosis, felt to be end-stage also bedbound, with cognitive deficits and failure to thrive -Poor prognosis has been discussed with patient and family on multiple locations, seen by palliative care, they are not on board with hospice philosophy -No payer source for home milrinone  therapy at this time, CSW working on [facility] LOG for short-term milrinone   AKI (acute kidney injury) (HCC) CKD stage 3a, hyperkalemia/ hypokalemia  hyponatremia.  Resolved  Atrial flutter (HCC) Currently on oral amiodarone  therapy, per cards likely atrial tachycardia, not on anticoagulation  AM Hypoglycemia - Likely secondary to liver disease from RV failure, depleted glycogen stores, shock liver - Add nightly Ensure  Shock liver Improving  Acute metabolic encephalopathy Multifactorial, related to cardiogenic shock Clinically has resolved, likely patient is back to her baseline.  Cognitive impairment due to history of CVA  H/o CVA (cerebral  vascular accident) (HCC) With hemiplegia, speech deficits, cognitive deficits Continue aspirin , statin on hold now  UTI (urinary tract infection) Urine culture positive for  E Coli, sensitive to cephalosporins.  She has completed antibiotic therapy   Mixed hyperlipidemia Continue statin therapy   Acquired hypothyroidism Continue levothyroxine    Class II obesity Calculated BMI is 34.3    DVT prophylaxis: SCDs Code Status: Full Code Family Communication: spouse at bedside Disposition Plan: Home pending resolution of milrinone  issue  Consultants:    Procedures:   Antimicrobials:    Objective: Vitals:   08/24/23 0847 08/24/23 1000 08/24/23 1100 08/24/23 1139  BP:  117/81 101/74 101/74  Pulse: 96   97  Resp: (!) 24  17   Temp:    98.2 F (36.8 C)  TempSrc:    Oral  SpO2: 100% 100% 100% 100%  Weight:      Height:        Intake/Output Summary (Last 24 hours) at 08/24/2023 1242 Last data filed at 08/23/2023 2353 Gross per 24 hour  Intake 480 ml  Output 250 ml  Net 230 ml   Filed Weights   08/22/23 0400 08/23/23 0426 08/24/23 0323  Weight: 111.6 kg 114.4 kg 114.3 kg    Examination:  General exam: Appears calm and comfortable  Respiratory system: Clear to auscultation Cardiovascular system: S1 & S2 heard, RRR.  Abd: nondistended, soft and nontender.Normal bowel sounds heard. Central nervous system: Alert and oriented. No focal neurological deficits. Extremities: no edema Skin: No rashes Psychiatry:  Mood & affect appropriate.     Data Reviewed:   CBC: Recent Labs  Lab 08/20/23 0440 08/21/23 0350 08/22/23 0500 08/23/23 0400 08/24/23 0550  WBC 3.2* 4.4 3.2* 2.9* 2.2*  HGB 11.5* 12.1 11.1* 11.1* 11.0*  HCT 35.4* 37.6  36.1 34.9* 34.6*  MCV 86.1 88.3 89.1 88.1 88.7  PLT 79* 86* 84* 88* 85*   Basic Metabolic Panel: Recent Labs  Lab 08/19/23 0500 08/20/23 0440 08/21/23 0350 08/22/23 0500 08/24/23 0550  NA 129* 132* 137 136 136  K 3.3* 3.5  4.0 3.5 3.7  CL 95* 98 103 104 105  CO2 27 27 27 28 27   GLUCOSE 243* 189* 84 128* 98  BUN 62* 54* 44* 36* 19  CREATININE 1.69* 1.43* 1.17* 0.98 0.68  CALCIUM  8.0* 8.4* 8.7* 8.3* 8.3*   GFR: Estimated Creatinine Clearance: 99.6 mL/min (by C-G formula based on SCr of 0.68 mg/dL). Liver Function Tests: Recent Labs  Lab 08/19/23 0500 08/20/23 0440 08/21/23 0350 08/22/23 0500 08/24/23 0550  AST 844* 492* 311* 189* 116*  ALT 245* 97* 40 24 21  ALKPHOS 92 88 88 79 64  BILITOT 1.3* 1.5* 1.6* 1.3* 1.0  PROT 9.4* 9.5* 9.8* 8.5* 8.4*  ALBUMIN  1.9* 1.9* 1.9* 1.7* 1.9*   No results for input(s): "LIPASE", "AMYLASE" in the last 168 hours. Recent Labs  Lab 08/22/23 0435  AMMONIA 21   Coagulation Profile: Recent Labs  Lab 08/18/23 0901  INR 1.8*   Cardiac Enzymes: No results for input(s): "CKTOTAL", "CKMB", "CKMBINDEX", "TROPONINI" in the last 168 hours. BNP (last 3 results) No results for input(s): "PROBNP" in the last 8760 hours. HbA1C: No results for input(s): "HGBA1C" in the last 72 hours. CBG: Recent Labs  Lab 08/23/23 1919 08/23/23 2350 08/24/23 0356 08/24/23 0756 08/24/23 0833  GLUCAP 108* 101* 79 59* 80   Lipid Profile: No results for input(s): "CHOL", "HDL", "LDLCALC", "TRIG", "CHOLHDL", "LDLDIRECT" in the last 72 hours. Thyroid Function Tests: No results for input(s): "TSH", "T4TOTAL", "FREET4", "T3FREE", "THYROIDAB" in the last 72 hours. Anemia Panel: No results for input(s): "VITAMINB12", "FOLATE", "FERRITIN", "TIBC", "IRON", "RETICCTPCT" in the last 72 hours. Urine analysis:    Component Value Date/Time   COLORURINE YELLOW 08/14/2023 2355   APPEARANCEUR HAZY (A) 08/14/2023 2355   LABSPEC 1.011 08/14/2023 2355   PHURINE 5.0 08/14/2023 2355   GLUCOSEU NEGATIVE 08/14/2023 2355   HGBUR MODERATE (A) 08/14/2023 2355   BILIRUBINUR NEGATIVE 08/14/2023 2355   KETONESUR NEGATIVE 08/14/2023 2355   PROTEINUR 100 (A) 08/14/2023 2355   NITRITE POSITIVE (A)  08/14/2023 2355   LEUKOCYTESUR MODERATE (A) 08/14/2023 2355   Sepsis Labs: @LABRCNTIP (procalcitonin:4,lacticidven:4)  ) Recent Results (from the past 240 hours)  Urine Culture     Status: Abnormal   Collection Time: 08/14/23 11:55 PM   Specimen: Urine, Clean Catch  Result Value Ref Range Status   Specimen Description   Final    URINE, CLEAN CATCH Performed at [facility], [address] [place], [place] [address]    Special Requests   Final    NONE Performed at [facility], [address], [place], [place] [address]    Culture >=100,000 COLONIES/mL ESCHERICHIA COLI (A)  Final   Report Status 08/17/2023 FINAL  Final   Organism ID, Bacteria ESCHERICHIA COLI (A)  Final      Susceptibility   Escherichia coli - MIC*    AMPICILLIN <=2 SENSITIVE Sensitive     CEFAZOLIN  <=4 SENSITIVE Sensitive     CEFEPIME <=0.12 SENSITIVE Sensitive     CEFTRIAXONE  <=0.25 SENSITIVE Sensitive     CIPROFLOXACIN <=0.25 SENSITIVE Sensitive     GENTAMICIN <=1 SENSITIVE Sensitive     IMIPENEM <=0.25 SENSITIVE Sensitive     NITROFURANTOIN <=16 SENSITIVE Sensitive     TRIMETH/SULFA <=20  SENSITIVE Sensitive     AMPICILLIN/SULBACTAM <=2 SENSITIVE Sensitive     PIP/TAZO <=4 SENSITIVE Sensitive ug/mL    * >=100,000 COLONIES/mL ESCHERICHIA COLI  Resp panel by RT-PCR (RSV, Flu A&B, Covid) Anterior Nasal Swab     Status: None   Collection Time: 08/15/23 12:09 AM   Specimen: Anterior Nasal Swab  Result Value Ref Range Status   SARS Coronavirus 2 by RT PCR NEGATIVE NEGATIVE Final    Comment: (NOTE) SARS-CoV-2 target nucleic acids are NOT DETECTED.  The SARS-CoV-2 RNA is generally detectable in upper respiratory specimens during the acute phase of infection. The lowest concentration of SARS-CoV-2 viral copies this assay can detect is 138 copies/mL. A negative result does not preclude SARS-Cov-2 infection and should not be used as the sole basis for treatment or other patient management decisions. A negative  result may occur with  improper specimen collection/handling, submission of specimen other than nasopharyngeal swab, presence of viral mutation(s) within the areas targeted by this assay, and inadequate number of viral copies(<138 copies/mL). A negative result must be combined with clinical observations, patient history, and epidemiological information. The expected result is Negative.  Fact Sheet for Patients:  BloggerCourse.com  Fact Sheet for Healthcare Providers:  SeriousBroker.it  This test is no t yet approved or cleared by the [place]  FDA and  has been authorized for detection and/or diagnosis of SARS-CoV-2 by FDA under an Emergency Use Authorization (EUA). This EUA will remain  in effect (meaning this test can be used) for the duration of the COVID-19 declaration under Section 564(b)(1) of the Act, 21 U.S.C.section 360bbb-3(b)(1), unless the authorization is terminated  or revoked sooner.       Influenza A by PCR NEGATIVE NEGATIVE Final   Influenza B by PCR NEGATIVE NEGATIVE Final    Comment: (NOTE) The Xpert Xpress SARS-CoV-2/FLU/RSV plus assay is intended as an aid in the diagnosis of influenza from Nasopharyngeal swab specimens and should not be used as a sole basis for treatment. Nasal washings and aspirates are unacceptable for Xpert Xpress SARS-CoV-2/FLU/RSV testing.  Fact Sheet for Patients: BloggerCourse.com  Fact Sheet for Healthcare Providers: SeriousBroker.it  This test is not yet approved or cleared by the [place]  FDA and has been authorized for detection and/or diagnosis of SARS-CoV-2 by FDA under an Emergency Use Authorization (EUA). This EUA will remain in effect (meaning this test can be used) for the duration of the COVID-19 declaration under Section 564(b)(1) of the Act, 21 U.S.C. section 360bbb-3(b)(1), unless the authorization is  terminated or revoked.     Resp Syncytial Virus by PCR NEGATIVE NEGATIVE Final    Comment: (NOTE) Fact Sheet for Patients: BloggerCourse.com  Fact Sheet for Healthcare Providers: SeriousBroker.it  This test is not yet approved or cleared by the [place]  FDA and has been authorized for detection and/or diagnosis of SARS-CoV-2 by FDA under an Emergency Use Authorization (EUA). This EUA will remain in effect (meaning this test can be used) for the duration of the COVID-19 declaration under Section 564(b)(1) of the Act, 21 U.S.C. section 360bbb-3(b)(1), unless the authorization is terminated or revoked.  Performed at [facility], [address], [place], [place] [address]   MRSA Next Gen by PCR, Nasal     Status: None   Collection Time: 08/15/23  5:09 PM   Specimen: Nasal Mucosa; Nasal Swab  Result Value Ref Range Status   MRSA by PCR Next Gen NOT DETECTED NOT DETECTED Final    Comment: (NOTE) The GeneXpert MRSA Assay (  FDA approved for NASAL specimens only), is one component of a comprehensive MRSA colonization surveillance program. It is not intended to diagnose MRSA infection nor to guide or monitor treatment for MRSA infections. Test performance is not FDA approved in patients less than 64 years old. Performed at [facility], [address] [place], [place] [address]   Culture, blood (Routine X 2) w Reflex to ID Panel     Status: None   Collection Time: 08/16/23  6:44 PM   Specimen: BLOOD  Result Value Ref Range Status   Specimen Description BLOOD BLOOD RIGHT HAND  Final   Special Requests   Final    BOTTLES DRAWN AEROBIC ONLY Blood Culture results may not be optimal due to an inadequate volume of blood received in culture bottles   Culture   Final    NO GROWTH 5 DAYS Performed at [facility], [address] [place], [place] [address]    Report Status 08/21/2023 FINAL  Final  Culture, blood (Routine X 2) w Reflex to  ID Panel     Status: None   Collection Time: 08/16/23  6:44 PM   Specimen: BLOOD  Result Value Ref Range Status   Specimen Description BLOOD BLOOD LEFT HAND  Final   Special Requests   Final    BOTTLES DRAWN AEROBIC ONLY Blood Culture results may not be optimal due to an inadequate volume of blood received in culture bottles   Culture   Final    NO GROWTH 5 DAYS Performed at [facility], [address], [place], [place] [address]    Report Status 08/21/2023 FINAL  Final     Radiology Studies: No results found.   Scheduled Meds:  amiodarone   200 mg Oral Daily   aspirin  EC  81 mg Oral Daily   budesonide  (PULMICORT ) nebulizer solution  0.5 mg Nebulization BID   Chlorhexidine  Gluconate Cloth  6 each Topical Q0600   feeding supplement  237 mL Oral QHS   furosemide   40 mg Oral Daily   Gerhardt's butt cream   Topical BID   lactulose   30 g Oral BID   levothyroxine   50 mcg Oral QAC breakfast   midodrine   5 mg Oral TID WC   pantoprazole   40 mg Oral Daily   potassium chloride   40 mEq Oral BID   sodium chloride  flush  10-40 mL Intracatheter Q12H   sodium chloride  flush  3 mL Intravenous Q12H   Continuous Infusions:  milrinone  0.25 mcg/kg/min (08/24/23 0142)     LOS: 9 days    Time spent:    Deforest Fast, MD [facility]   08/24/2023, 12:42 PM

## 2023-08-24 NOTE — Progress Notes (Signed)
 Non-patient visitor entered the chaplain's office appearing visibly upset. After inquiry, chaplain learned that it was pt's spouse [name]. Chaplain invited him to sit and provided supportive presence. [name] had some difficulty expressing himself. He shared his concerns and emotional distress for pt. Chaplain offered a listening ear and spiritual support. Concluded with a prayer for him and his wife at his request.   Spiritual Care will continue to follow-up with both [name] and the pt.

## 2023-08-24 NOTE — TOC Progression Note (Addendum)
 Transition of Care [facility]) - Progression Note    Patient Details  Name: [name] MRN: [id number] Date of Birth: 1965-07-01  [facility] CM/SW Contact  Benjiman Bras, RN Phone Number: [phone] 08/24/2023, 11:53 AM  Clinical Narrative:   TOC CM contacted dtr, [name] and left message for return call.   Dtr declined Home Hospice with Milrinone . Will discuss with dtr dc options with HH RN, PT and aide and set up with [facility] for aide to come in home to assist with ADL's.   TOC CM contacted TOC Director, Zack to assist with discharge planning.   Received call back from pt's dtr, stating she did not decline Home Hospice with Milrinone . She had questions about DNR status. Contacted [facility] rep, Melissa and she will have liaison give call back.   Contacted Pam, [employer] rep and she will contact [facility] to assist with Home Milrinone .   Received notification from [facility] and can arrange primary care service and outpatient palliative. Unable to offer Home Hospice with Milrinone .   Contacted [facility], rep Cheri and cannot service Milrinone  in the home.    Expected Discharge Plan: Home w Hospice Care Barriers to Discharge: Continued Medical Work up  Expected Discharge Plan and Services In-house Referral: Clinical Social Work Discharge Planning Services: CM Consult Post Acute Care Choice: Hospice Living arrangements for the past 2 months: Single Family Home                           HH Arranged: RN [facility] Agency: [facility] ([facility]) Date HH Agency Contacted: 08/22/23 Time HH Agency Contacted: 1423 Representative spoke with at [facility] Agency: Ardine Beckwith   Social Determinants of Health (SDOH) Interventions SDOH Screenings   Food Insecurity: No Food Insecurity (08/15/2023)  Housing: Low Risk  (08/15/2023)  Transportation Needs: No Transportation Needs (08/15/2023)  Utilities: Not  At Risk (08/15/2023)  Alcohol Screen: Low Risk  (08/31/2021)  Depression (PHQ2-9): Low Risk  (08/31/2021)  Financial Resource Strain: Low Risk  (04/07/2023)   Received from [facility]  Physical Activity: Sufficiently Active (08/31/2021)  Recent Concern: Physical Activity - Inactive (07/07/2021)   Received from [facility], [facility]  Social Connections: Unknown (07/08/2022)   Received from [facility], [facility]  Stress: No Stress Concern Present (08/31/2021)  Tobacco Use: High Risk (08/15/2023)    Readmission Risk Interventions    08/15/2023    1:45 PM  Readmission Risk Prevention Plan  Transportation Screening Complete  Home Care Screening Complete  Medication Review (RN CM) Complete

## 2023-08-24 NOTE — Progress Notes (Addendum)
 Advanced Heart Failure Rounding Note  Cardiologist: Ola Berger, MD   Chief Complaint: Acute on chronic RV failure/Cardiogenic shock  Subjective:    CO-OX 68% on 0.25 milrinone   CVP 6  No complaints. Denies shortness of breath.   Objective:    Weight Range: 114.3 kg Body mass index is 40.67 kg/m.   Vital Signs:   Temp:  [97.5 F (36.4 C)-98.1 F (36.7 C)] 97.9 F (36.6 C) (05/22 0710) Pulse Rate:  [90-99] 96 (05/22 0847) Resp:  [19-25] 24 (05/22 0847) BP: (116-135)/(73-98) 129/98 (05/22 0800) SpO2:  [94 %-100 %] 100 % (05/22 0847) Weight:  [114.3 kg] 114.3 kg (05/22 0323) Last BM Date : 08/23/23  Weight change: Filed Weights   08/22/23 0400 08/23/23 0426 08/24/23 0323  Weight: 111.6 kg 114.4 kg 114.3 kg   Intake/Output:  Intake/Output Summary (Last 24 hours) at 08/24/2023 0944 Last data filed at 08/23/2023 2353 Gross per 24 hour  Intake 498.34 ml  Output 250 ml  Net 248.34 ml    Physical Exam    General:  Chronically ill appearing Neck: JVP 6 cm Cor: Regular rate & rhythm. No rubs, gallops or murmurs. Lungs: breathing nonlabored Abdomen: obese, soft, nontender, nondistended.  Extremities: no edema Neuro: Awake. Gives brief answers to questions   Telemetry   SR/ST 90s-100s  Labs    CBC Recent Labs    08/23/23 0400 08/24/23 0550  WBC 2.9* 2.2*  HGB 11.1* 11.0*  HCT 34.9* 34.6*  MCV 88.1 88.7  PLT 88* 85*   Basic Metabolic Panel Recent Labs    95/28/41 0500 08/24/23 0550  NA 136 136  K 3.5 3.7  CL 104 105  CO2 28 27  GLUCOSE 128* 98  BUN 36* 19  CREATININE 0.98 0.68  CALCIUM  8.3* 8.3*   Liver Function Tests Recent Labs    08/22/23 0500 08/24/23 0550  AST 189* 116*  ALT 24 21  ALKPHOS 79 64  BILITOT 1.3* 1.0  PROT 8.5* 8.4*  ALBUMIN  1.7* 1.9*   BNP (last 3 results) Recent Labs    07/09/23 1320 08/14/23 2341  BNP 566.0* 1,054.0*   Imaging   No results found.  Medications:    Scheduled Medications:   amiodarone   200 mg Oral Daily   aspirin  EC  81 mg Oral Daily   budesonide  (PULMICORT ) nebulizer solution  0.5 mg Nebulization BID   Chlorhexidine  Gluconate Cloth  6 each Topical Q0600   feeding supplement  237 mL Oral QHS   Gerhardt's butt cream   Topical BID   lactulose   30 g Oral BID   levothyroxine   50 mcg Oral QAC breakfast   midodrine   5 mg Oral TID WC   pantoprazole   40 mg Oral Daily   sodium chloride  flush  10-40 mL Intracatheter Q12H   sodium chloride  flush  3 mL Intravenous Q12H   Infusions:  milrinone  0.25 mcg/kg/min (08/24/23 0142)   PRN Medications: acetaminophen  **OR** acetaminophen , ipratropium-albuterol , prochlorperazine , sodium chloride  flush, sodium chloride  flush  Patient Profile   58 y.o. with a history of CVA w/ residual left hemiparesis, HTN, HLD, anemia, seropositive rheumatoid arthritis, and hypothyroidism and recently diagnosed w/ severe RV failure and PH. Admitted for acute on chronic RV failure with Cardiogenic shock.   Assessment/Plan   1. Acute on Chronic RV Failure w/ Cardiogenic Shock / Pulmonary HTN  - End-stage RV failure - Echo 4/25 preserved LVEF, 50-55%, and severely reduced RV  - RHC 4/25: (RA 4, PA 69/29 m44, PCWP 13,  PVR of 6.4 with CI of 2.4 L/min/m2). - V/Q scan negative. Pt declined PFT evaluation  - Failed Sildenafil  due to hypotension. Started on Midodrine   - initially CGS w/ lactic acidosis, shock liver and AKI. LA 4.7, Co-ox 47%. Improving on Milrinone  0.25.  - continue milrinone  support.  - CVP 6. Start po lasix  40 daily and supp K - continue midodrine  5 mg tid for BP support  - she is end-stage. Has been essentially bedbound since last admission. There are no durable solutions for her.  - She has end-stage RV failure and admission c/b cardiogenic shock and MSOF. Overall poor prognosis. Improved with milrinone  but no long-term options. After multiple discussions with family she currently remains full code. Daughter has requested that  Palliative not follow the patient at this time. Have not found a payer source for home milrinone . HF CSW working on LOG from [facility] but this would only guarantee 30 days of home milrinone .  2. ? Aflutter vs atrial tachycardia - Noted intermittently at [facility] on 5/14. Initially started on amiodarone  gtt and later stopped d/t hypotension. Now on po amiodarone  200 mg daily to reduce risk of recurrent arrhythmias on inotrope support. - Rhythm strips available were reviewed by our team. Felt to be possible atrial tachycardia. No clear role for anticoagulation.  - [facility]   3. Shock Liver - improving with inotrope support. continue - NH3 elevated at 50>lactulose >21   3. AKI on CKD IIIa - SCr 1.2>>2.2 (b/l 0.6) - CG shock management per above - Resolved.   4. Hyperkalemia/Hypokalemia - resolved with Lokelma  - Now hypokalemic. Supp today.   5. Seropositive Rheumatoid Arthritis  - on Humira  and methotrexate  as outpatient  - Followed by Dr. Rodell Citrin   6. Hx CVA - Weelchair bound, some aphasia - On aspirin . Statin with liver dysfunction    Length of Stay: 9  Frost, Elizabeth N, PA-C  08/24/2023, 9:44 AM  Advanced Heart Failure Team Pager [phone] (M-F; 7a - 5p)  Please contact [facility] for night-coverage after hours (5p -7a ) and weekends on [email or web address]   Patient seen and examined with the above-signed Advanced Practice Provider and/or Housestaff. I personally reviewed laboratory data, imaging studies and relevant notes. I independently examined the patient and formulated the important aspects of the plan. I have edited the note to reflect any of my changes or salient points. I have personally discussed the plan with the patient and/or family.  Remains on milrinone . Co-ox and CVP stable. Remains very weak. Renal function normalized.   General:  Weak appearing. No resp difficulty HEENT: normal  Neck: supple. no JVD. Carotids 2+ bilat; no bruits. No lymphadenopathy  or thryomegaly appreciated. Cor: RRR 2/6 TR Lungs: clear Abdomen: soft, nontender, nondistended. No hepatosplenomegaly. No bruits or masses. Good bowel sounds. Extremities: no cyanosis, clubbing, rash, edema Neuro: alert dysarthic weak on left  Difficult situation. She has end-stage RV failure. Hemodynamically improved with milrinone .   Have discussed with family that benefit of milrinone  will likely be short-lived (weeks to months). They would like to proceed with home milrinone .   Unfortunately given insurance status unable to find agency to support home milrinone . Only option to get milrinone  currently would be through [facility] but family has met with Hospice team and not aligned with Hospice philosophy.   At this point, only options are for family to pay for milrinone  and home care personally or wean off milrinone .   SW discussing with family.  At  this point, the AHF team has little left to offer.   Elizabeth Oar, MD  1:14 PM

## 2023-08-24 NOTE — Progress Notes (Deleted)
 Office Visit Note  Patient: [name]             Date of Birth: [birth year]           MRN: [id number]             PCP: [facility] Referring: Alfredia Ina, MD Visit Date: 09/06/2023   Subjective:  No chief complaint on file.   History of Present Illness: [name] is a 58 y.o. female here for follow up for seropositive RA on Humira  40 mg subcu q. 14 days and methotrexate  15 mg p.o. weekly and folic acid  1 mg daily.    Previous HPI 02/22/2023 [name] is a 58 y.o. female here for follow up for seropositive RA on Humira  40 mg subcu q. 14 days and methotrexate  15 mg p.o. weekly and folic acid  1 mg daily.  But she is off the methotrexate  for the past 2 weeks due to running out of medications.  She is noticing increased joint pain and swelling in multiple areas worst affected at the right knee.  Less so with right hand and right shoulder pain increased.  She has not had any significant infections since last visit.  She has not made any progress increasing mobility around the house still uses her wheelchair pretty much continuously.   Previous HPI 11/22/2022 [name] is a 58 y.o. female here for follow up for seropositive RA on Humira  40 mg subcu q. 14 days and methotrexate  15 mg p.o. weekly and folic acid  1 mg daily.  She has been doing some work with physical therapy now mobility is slightly improved doing better at getting herself back and forth to the bathroom and moving around at home.  Does feel like her leg pain is worse with increased use.  Is having some increased shoulder pain at night as well.   08/22/2022 [name] is a 58 y.o. female here for follow up for seropositive RA on Humira  40 mg subcu q. 14 days and methotrexate  15 mg p.o. weekly and folic acid  1 mg daily.  Overall she feels symptoms are doing okay without major exacerbation.  She still has very limited mobility and did not start any formal work with physical therapy yet  about her leg weakness and limited walking.  With weakness on left side gets some more joint pain and swelling in her right hand and right knee has not had any falls.   05/24/22 [name] is a 58 y.o. female here for follow up for seropositive RA on Humira  40 mg subcu q. 14 days and methotrexate  15 mg p.o. weekly folic acid  1 mg daily.  Since her last visit she had 1 hospitalization for severe illness with sepsis requiring IV antibiotic treatment but she recovered entirely after this.  She has been doing reasonably well without severe RA flareups off of any maintenance steroids for at least 3 months.  Started on Humira  in November taking her injections subcutaneous on the abdomen with no problems.  No serious infections since starting this medication.  She ran out of the methotrexate  within the past few weeks due to missing some scheduled clinic follow-up.  She remains mostly immobile without much progress walking or doing any regular exercises towards this goal.   12/14/21 [name] is a 58 y.o. female here for follow up for seropositive RA on MTX 15 mg PO weekly and folic acid  1 mg daily. She continues to have swelling in her hands but not  much pain unless tightly gripping or under pressure. Her right knee pain limits weightbearing severely and she has not been able to travel even 5-10 ft distances to the bathroom consistently. She is not sure whether this knee swells usually. 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   Previous HPI [name] is a 58 y.o. female here for follow up for seropositive RA on MTX 20 mg PO weekly and folic acid  1 mg daily. She took a course of oral prednisone  after last visit in November but joint symptoms have been reasonably controlled on just methotrexate  since then. We had discussed possible addition of Humira . She remains limited in mobility mostly due to left leg weakness and stiffness, using a cane at home.   Previous  HPI 02/10/21 [name] is a 58 y.o. female here for telehealth visit by video for follow up of her seropositive rheumatoid arthritis on methotrexate  20 mg PO weekly. She is experiencing increased joint pain and swelling for the past 2 to 3 weeks unable to tightly close her right hand and cannot bear weight enough to assist in transferring out of bed and wheelchair. She reports feeling better each week after taking methotrexate  but quickly worsens again within a few days and this has been worsening. Otherwise no new side effect or health changes. We reviewed her previous clinic visit still had some ongoing swelling and very high sedimentation rate tests but the pain and mobility is a lot worse.   Previous HPI 06/19/20 [name] is a 58 y.o. female with history of hypothyroidism, CVA with residual left hemiparesis, HTN, HLD here for evaluation of arthritis of multiple joints and positive rheumatoid factor. She has symptoms for some amount of time at least 6 months, swelling, and stiffness of joints on the right half of her body including elbow, wrist, fingers, and knee. She was evaluated in PCP clinic with highly positive inflammatory markers and RA antibodies with swollen joints noted on exam in 11/2019. She also had positive ANA and RNP antibodies. Xray of the right hand was checked and reportedly not show other cause of joint pain and no erosion. She has used some topical diclofenac without much improvement and tried tramadol  also without much improvement. She has become unable to walk much due to severe pain bearing weight on her right knee and the left leg is too weak. Today she says symptoms are doing better compared to 2 months ago severity. She denies any complaints of hair loss, rashes, lymphadenopathy, oral ulcers, raynaud's.    Labs reviewed 11/2019 ESR 114 CRP 36 RF 421.6 CCP >250 ANA pos RNP >8.0 DsDNA, Sm, SSA, SSB neg Vit D 12.7   No Rheumatology ROS completed.   PMFS History:   Patient Active Problem List   Diagnosis Date Noted   AKI (acute kidney injury) (HCC) 08/18/2023   Atrial flutter (HCC) 08/18/2023   Acute metabolic encephalopathy 08/16/2023   Acute on chronic diastolic CHF (congestive heart failure) (HCC) 08/16/2023   Cardiogenic shock (HCC) 08/16/2023   Acute on chronic heart failure with preserved ejection fraction (HFpEF) (HCC) 08/15/2023   UTI (urinary tract infection) 08/15/2023   Reactive airway disease 08/15/2023   Pulmonary hypertension, unspecified (HCC) 07/14/2023   Shortness of breath 07/11/2023   Pressure injury of skin 07/11/2023   Elevated brain natriuretic peptide (BNP) level 07/10/2023   Elevated d-dimer 07/10/2023   Elevated troponin 07/10/2023   Leukopenia 07/10/2023   Prolonged QT interval 07/10/2023  Hypoalbuminemia due to protein-calorie malnutrition (HCC) 07/10/2023   Transaminitis 07/10/2023   Class II obesity 07/10/2023   Acute CHF (congestive heart failure) (HCC) 07/09/2023   Acute respiratory failure with hypoxia (HCC) 01/15/2022   Hypokalemia 01/15/2022   Hyponatremia 01/15/2022   CAP (community acquired pneumonia) 01/14/2022   Sepsis (HCC) 01/14/2022   Pain in right knee 12/14/2021   Mild intermittent asthma without complication 07/07/2021   Normocytic anemia 12/02/2020   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/19/2020   High risk medication use 06/19/2020   Vitamin D  deficiency 06/19/2020   Arthritis involving multiple sites 02/18/2018   Mixed hyperlipidemia 02/16/2018   Class 3 severe obesity due to excess calories with serious comorbidity and body mass index (BMI) of 45.0 to 49.9 in adult 02/16/2018   Hemiparesis and alteration of sensations as late effects of stroke (HCC) 02/16/2018   Anogenital (venereal) warts 08/04/2017   Screening for colorectal cancer 08/04/2017   Encounter for gynecological examination with Papanicolaou smear of cervix 08/04/2017    Vertigo 08/04/2017   Vertigo as late effect of stroke 08/04/2017   Tobacco abuse 06/01/2017   Acquired hypothyroidism 05/12/2017   CVA (cerebral vascular accident) (HCC) 06/25/2013   Tremor of left hand 06/25/2013   Hemiparesis, left (HCC) 06/25/2013   History of CVA with residual deficit 06/25/2013   Difficulty walking 08/31/2011   Unstable balance 08/31/2011   Weakness of left side of body 08/31/2011   Difficulty walking 08/31/2011   Cerebral infarction (HCC) 04/07/2011   Left hand weakness 04/07/2011   Essential hypertension 04/07/2011    Past Medical History:  Diagnosis Date   High cholesterol    Hypertension    Hypothyroidism    Stroke (HCC)    weakness on left side.   Vaginal Pap smear, abnormal     Family History  Problem Relation Age of Onset   Hypertension Mother    Liver disease Mother    Heart attack Father    HIV Sister    Arrhythmia Sister    Mood Disorder Sister    Arrhythmia Brother    CAD Brother    Past Surgical History:  Procedure Laterality Date   DILATION AND CURETTAGE OF UTERUS     LASER ABLATION CONDOLAMATA Bilateral 09/27/2017   Procedure: LASER ABLATION CONDYLOMA AND REMOVAL OF PERIANAL WART;  Surgeon: Wendelyn Halter, MD;  Location: [facility];  Service: Gynecology;  Laterality: Bilateral;   MASS EXCISION Left 09/27/2017   Procedure: EXCISION LESION LEFT THIGH;  Surgeon: Wendelyn Halter, MD;  Location: [facility];  Service: Gynecology;  Laterality: Left;   RIGHT HEART CATH N/A 07/14/2023   Procedure: RIGHT HEART CATH;  Surgeon: Alwin Baars, DO;  Location: [facility];  Service: Cardiovascular;  Laterality: N/A;   Social History   Social History Narrative   Grew up in [place], [place].   Engaged to be married.   5 children.    Eats mainly meat/junk food.   Immunization History  Administered Date(s) Administered   PFIZER Comirnaty(Gray Top)Covid-19 Tri-Sucrose Vaccine 08/26/2020   PFIZER(Purple Top)SARS-COV-2 Vaccination 12/12/2019,  01/02/2020   Pfizer(Comirnaty)Fall Seasonal Vaccine 12 years and older 01/25/2022, 01/04/2023     Objective: Vital Signs: LMP 07/29/2012    Physical Exam   Musculoskeletal Exam: ***  CDAI Exam: CDAI Score: -- Patient Global: --; Provider Global: -- Swollen: --; Tender: -- Joint Exam 09/06/2023   No joint exam has been documented for this visit   There is  currently no information documented on the homunculus. Go to the Rheumatology activity and complete the homunculus joint exam.  Investigation: No additional findings.  Imaging: ECHOCARDIOGRAM LIMITED Result Date: 08/17/2023    ECHOCARDIOGRAM LIMITED REPORT   Patient Name:   THOMASENA Tenbrink Date of Exam: 08/17/2023 Medical Rec #:  [id number]    Height:       66.0 in Accession #:    [id number]   Weight:       213.6 lb Date of Birth:  [birth year]    BSA:          2.057 m Patient Age:    58 years     BP:           112/72 mmHg Patient Gender: F            HR:           83 bpm. Exam Location:  [facility] Procedure: Limited Echo, Limited Color Doppler and Cardiac Doppler (Both            Spectral and Color Flow Doppler were utilized during procedure). Indications:    Dyspnea R06.00  History:        Patient has prior history of Echocardiogram examinations, most                 recent 07/11/2023. CHF, Stroke; Risk Factors:Hypertension and                 Dyslipidemia.  Sonographer:    Denese Finn RCS Referring Phys: [id number] Joyceann No BRANCH IMPRESSIONS  1. Left ventricular ejection fraction, by estimation, is 40 to 45%. The left ventricle has low normal function. Left ventricular endocardial border not optimally defined to evaluate regional wall motion. There is the interventricular septum is flattened  in systole and diastole, consistent with right ventricular pressure and volume overload.  2. Right ventricular systolic function is severely reduced. The right ventricular size is severely enlarged. There is moderately elevated pulmonary artery systolic  pressure.  3. The tricuspid valve is abnormal. Tricuspid valve regurgitation is mild to moderate.  4. The inferior vena cava is normal in size with <50% respiratory variability, suggesting right atrial pressure of 8 mmHg.  5. Limited echo to evaluate LV and RV function FINDINGS  Left Ventricle: Left ventricular ejection fraction, by estimation, is 40 to 45%. The left ventricle has low normal function. Left ventricular endocardial border not optimally defined to evaluate regional wall motion. The interventricular septum is flattened in systole and diastole, consistent with right ventricular pressure and volume overload. Right Ventricle: The right ventricular size is severely enlarged. Right ventricular systolic function is severely reduced. There is moderately elevated pulmonary artery systolic pressure. The tricuspid regurgitant velocity is 3.12 m/s, and with an assumed right atrial pressure of 8 mmHg, the estimated right ventricular systolic pressure is 46.9 mmHg. Tricuspid Valve: The tricuspid valve is abnormal. Tricuspid valve regurgitation is mild to moderate. No evidence of tricuspid stenosis. Venous: The inferior vena cava is normal in size with less than 50% respiratory variability, suggesting right atrial pressure of 8 mmHg. LEFT VENTRICLE PLAX 2D LVIDd:         4.40 cm LVIDs:         3.50 cm LV PW:         1.20 cm LV IVS:        1.10 cm LVOT diam:     2.20 cm LVOT Area:     3.80 cm  LV Volumes (MOD) LV vol d,  MOD A2C: 88.7 ml LV vol d, MOD A4C: 76.5 ml LV vol s, MOD A2C: 49.3 ml LV vol s, MOD A4C: 44.9 ml LV SV MOD A2C:     39.4 ml LV SV MOD A4C:     76.5 ml LV SV MOD BP:      37.8 ml RIGHT VENTRICLE TAPSE (M-mode): 1.5 cm LEFT ATRIUM         Index       RIGHT ATRIUM           Index LA diam:    3.40 cm 1.65 cm/m  RA Area:     31.30 cm                                 RA Volume:   131.00 ml 63.70 ml/m   AORTA Ao Root diam: 3.30 cm TRICUSPID VALVE TR Peak grad:   38.9 mmHg TR Vmax:        312.00 cm/s   SHUNTS Systemic Diam: 2.20 cm Armida Lander MD Electronically signed by Armida Lander MD Signature Date/Time: 08/17/2023/2:28:34 PM    Final    DG CHEST PORT 1 VIEW Result Date: 08/16/2023 CLINICAL DATA:  Cough with weakness and shortness of breath. EXAM: PORTABLE CHEST 1 VIEW COMPARISON:  08/15/2023. FINDINGS: Leftward patient rotation. The cardio pericardial silhouette is enlarged. Possible retrocardiac atelectasis or infiltrate. Right lung clear. No acute bony abnormality. Telemetry leads overlie the chest. IMPRESSION: Rotated film with cardiomegaly. Possible retrocardiac left base atelectasis or infiltrate. Electronically Signed   By: Donnal Fusi M.D.   On: 08/16/2023 06:39   US  EKG SITE RITE Result Date: 08/15/2023 If Site Rite image not attached, placement could not be confirmed due to current cardiac rhythm.  DG Chest Port 1 View Result Date: 08/15/2023 CLINICAL DATA:  Shortness of breath with loss of appetite and diarrhea. EXAM: PORTABLE CHEST 1 VIEW COMPARISON:  July 14, 2023 FINDINGS: The cardiac silhouette is mildly enlarged and unchanged in size. There is no evidence of acute infiltrate, pleural effusion or pneumothorax. The visualized skeletal structures are unremarkable. IMPRESSION: No active cardiopulmonary disease. Electronically Signed   By: Virgle Grime M.D.   On: 08/15/2023 00:30    Recent Labs: Lab Results  Component Value Date   WBC 2.2 (L) 08/24/2023   HGB 11.0 (L) 08/24/2023   PLT 85 (L) 08/24/2023   NA 136 08/24/2023   K 3.7 08/24/2023   CL 105 08/24/2023   CO2 27 08/24/2023   GLUCOSE 98 08/24/2023   BUN 19 08/24/2023   CREATININE 0.68 08/24/2023   BILITOT 1.0 08/24/2023   ALKPHOS 64 08/24/2023   AST 116 (H) 08/24/2023   ALT 21 08/24/2023   PROT 8.4 (H) 08/24/2023   ALBUMIN  1.9 (L) 08/24/2023   CALCIUM  8.3 (L) 08/24/2023   GFRAA 115 07/29/2020   QFTBGOLDPLUS NEGATIVE 11/22/2022    Speciality Comments: No specialty comments  available.  Procedures:  No procedures performed Allergies: Lisinopril    Assessment / Plan:     Visit Diagnoses: No diagnosis found.  ***  Orders: No orders of the defined types were placed in this encounter.  No orders of the defined types were placed in this encounter.    Follow-Up Instructions: No follow-ups on file.   Glena Landau, RT  Note - This record has been created using [employer].  Chart creation errors have been sought, but may not always  have been located.  Such creation errors do not reflect on  the standard of medical care.

## 2023-08-25 ENCOUNTER — Other Ambulatory Visit (HOSPITAL_COMMUNITY): Payer: Self-pay

## 2023-08-25 DIAGNOSIS — G9341 Metabolic encephalopathy: Secondary | ICD-10-CM | POA: Diagnosis not present

## 2023-08-25 DIAGNOSIS — Z7189 Other specified counseling: Secondary | ICD-10-CM | POA: Diagnosis not present

## 2023-08-25 DIAGNOSIS — Z515 Encounter for palliative care: Secondary | ICD-10-CM | POA: Diagnosis not present

## 2023-08-25 DIAGNOSIS — I5033 Acute on chronic diastolic (congestive) heart failure: Secondary | ICD-10-CM | POA: Diagnosis not present

## 2023-08-25 LAB — BASIC METABOLIC PANEL WITH GFR
Anion gap: 5 (ref 5–15)
BUN: 14 mg/dL (ref 6–20)
CO2: 26 mmol/L (ref 22–32)
Calcium: 8.1 mg/dL — ABNORMAL LOW (ref 8.9–10.3)
Chloride: 105 mmol/L (ref 98–111)
Creatinine, Ser: 0.77 mg/dL (ref 0.44–1.00)
GFR, Estimated: >60 mL/min (ref >60)
Glucose, Bld: 80 mg/dL (ref 70–99)
Potassium: 4.1 mmol/L (ref 3.5–5.1)
Sodium: 136 mmol/L (ref 135–145)

## 2023-08-25 LAB — GLUCOSE, CAPILLARY
Glucose-Capillary: 81 mg/dL (ref 70–99)
Glucose-Capillary: 83 mg/dL (ref 70–99)
Glucose-Capillary: 67 mg/dL — ABNORMAL LOW (ref 70–99)
Glucose-Capillary: 92 mg/dL (ref 70–99)

## 2023-08-25 LAB — CBC
HCT: 33.8 % — ABNORMAL LOW (ref 36.0–46.0)
Hemoglobin: 10.3 g/dL — ABNORMAL LOW (ref 12.0–15.0)
MCH: 27.3 pg (ref 26.0–34.0)
MCHC: 30.5 g/dL (ref 30.0–36.0)
MCV: 89.7 fL (ref 80.0–100.0)
Platelets: 85 10*3/uL — ABNORMAL LOW (ref 150–400)
RBC: 3.77 MIL/uL — ABNORMAL LOW (ref 3.87–5.11)
RDW: 18.6 % — ABNORMAL HIGH (ref 11.5–15.5)
WBC: 2 10*3/uL — ABNORMAL LOW (ref 4.0–10.5)
nRBC: 0 % (ref 0.0–0.2)

## 2023-08-25 LAB — COOXEMETRY PANEL
Carboxyhemoglobin: 2.6 % — ABNORMAL HIGH (ref 0.5–1.5)
Methemoglobin: <0.7 % (ref 0.0–1.5)
O2 Saturation: 69.7 %
Total hemoglobin: 11.1 g/dL — ABNORMAL LOW (ref 12.0–16.0)

## 2023-08-25 MED ORDER — MIDODRINE HCL 5 MG PO TABS
5.0000 mg | ORAL_TABLET | Freq: Three times a day (TID) | ORAL | 0 refills | Status: DC
  Filled 2023-08-25: qty 90, 30d supply, fill #0

## 2023-08-25 MED ORDER — AMIODARONE HCL 200 MG PO TABS
200.0000 mg | ORAL_TABLET | Freq: Every day | ORAL | 0 refills | Status: DC
  Filled 2023-08-25: qty 30, 30d supply, fill #0

## 2023-08-25 MED ORDER — POTASSIUM CHLORIDE CRYS ER 20 MEQ PO TBCR
20.0000 meq | EXTENDED_RELEASE_TABLET | Freq: Every day | ORAL | 0 refills | Status: DC
  Filled 2023-08-25: qty 30, 30d supply, fill #0

## 2023-08-25 MED ORDER — MILRINONE LACTATE IN DEXTROSE 20-5 MG/100ML-% IV SOLN
0.2500 ug/kg/min | INTRAVENOUS | Status: DC
  Administered 2023-08-25: 0.25 ug/kg/min via INTRAVENOUS
  Filled 2023-08-25 (×2): qty 100

## 2023-08-25 MED ORDER — FUROSEMIDE 40 MG PO TABS
40.0000 mg | ORAL_TABLET | Freq: Every day | ORAL | 0 refills | Status: DC
  Filled 2023-08-25: qty 60, 60d supply, fill #0

## 2023-08-25 NOTE — Progress Notes (Addendum)
 Advanced Heart Failure Rounding Note  Cardiologist: Ola Berger, MD  Chief Complaint: Acute on chronic RV failure/Cardiogenic shock Subjective:    Co-ox 70 on Milrinone  0.25.  Has been refusing medications and blood sugar checks intermittently, as patient has stated that she does not want them anymore and wants to go home.   Discharge planning continues to be complicated by the patient's current medical condition and differing goals of care between family members and the patient, in conjunction with available resources. Will continue to work with social work and primary team on above.   Objective:    Weight Range: 112 kg Body mass index is 39.87 kg/m.   Vital Signs:   Temp:  [97.2 F (36.2 C)-99 F (37.2 C)] 98.2 F (36.8 C) (05/23 0715) Pulse Rate:  [90-100] 100 (05/23 0715) Resp:  [17-30] 30 (05/23 0900) BP: (101-122)/(70-89) 120/85 (05/23 0900) SpO2:  [99 %-100 %] 100 % (05/23 0900) Weight:  [562 kg] 112 kg (05/23 0500) Last BM Date : 08/24/23  Weight change: Filed Weights   08/23/23 0426 08/24/23 0323 08/25/23 0500  Weight: 114.4 kg 114.3 kg 112 kg   Intake/Output:  Intake/Output Summary (Last 24 hours) at 08/25/2023 0940 Last data filed at 08/25/2023 0840 Gross per 24 hour  Intake 120 ml  Output 225 ml  Net -105 ml    Physical Exam    General: Chronically ill appearing Cardiac: JVP flat. S1 and S2 present. No murmurs or rub. Resp: Lung sounds clear and equal B/L Extremities: Warm and dry.  No edema.  Neuro: Awake, briefly answering questions  Telemetry   SR in 100s (personally reviewed)  Labs    CBC Recent Labs    08/24/23 0550 08/25/23 0500  WBC 2.2* 2.0*  HGB 11.0* 10.3*  HCT 34.6* 33.8*  MCV 88.7 89.7  PLT 85* 85*   Basic Metabolic Panel Recent Labs    13/08/65 0550 08/25/23 0500  NA 136 136  K 3.7 4.1  CL 105 105  CO2 27 26  GLUCOSE 98 80  BUN 19 14  CREATININE 0.68 0.77  CALCIUM  8.3* 8.1*   Liver Function Tests Recent  Labs    08/24/23 0550  AST 116*  ALT 21  ALKPHOS 64  BILITOT 1.0  PROT 8.4*  ALBUMIN  1.9*   BNP (last 3 results) Recent Labs    07/09/23 1320 08/14/23 2341  BNP 566.0* 1,054.0*   Imaging   No results found.  Medications:    Scheduled Medications:  amiodarone   200 mg Oral Daily   aspirin  EC  81 mg Oral Daily   budesonide  (PULMICORT ) nebulizer solution  0.5 mg Nebulization BID   Chlorhexidine  Gluconate Cloth  6 each Topical Q0600   feeding supplement  237 mL Oral QHS   furosemide   40 mg Oral Daily   Gerhardt's butt cream   Topical BID   lactulose   30 g Oral BID   levothyroxine   50 mcg Oral QAC breakfast   midodrine   5 mg Oral TID WC   pantoprazole   40 mg Oral Daily   potassium chloride   40 mEq Oral BID   sodium chloride  flush  10-40 mL Intracatheter Q12H   sodium chloride  flush  3 mL Intravenous Q12H   Infusions:  milrinone  0.25 mcg/kg/min (08/25/23 0618)   PRN Medications: acetaminophen  **OR** acetaminophen , ipratropium-albuterol , prochlorperazine , sodium chloride  flush, sodium chloride  flush  Patient Profile   58 y.o. with a history of CVA w/ residual left hemiparesis, HTN, HLD, anemia, seropositive rheumatoid arthritis,  and hypothyroidism and recently diagnosed w/ severe RV failure and PH. Admitted for acute on chronic RV failure with Cardiogenic shock.   Assessment/Plan   1. Acute on Chronic RV Failure w/ Cardiogenic Shock / Pulmonary HTN  - End-stage RV failure - Echo 4/25 preserved LVEF, 50-55%, and severely reduced RV  - RHC 4/25: (RA 4, PA 69/29 m44, PCWP 13, PVR of 6.4 with CI of 2.4 L/min/m2). - V/Q scan negative. Pt declined PFT evaluation  - Failed Sildenafil  due to hypotension - initially CGS w/ lactic acidosis, shock liver and AKI. LA 4.7, Co-ox 47%. Started on Milrinone .  - continue Lasix  40 mg daily - continue midodrine  5 mg tid for BP support  - She has end-stage RV failure and admission c/b cardiogenic shock and MSOF. Has been essentially  bedbound since last admission. Overall poor prognosis. Improved with milrinone  but no long-term options. Discussed with patient and husband this morning, they would like to go home with hospice. Denied hospice services through [facility], d/t goals initially not aligning with hospice philosophy and sole want for home milrinone  coverage. Have not found a payor for home milrinone . HF SW attempted LOG from [facility], however this was not feasible d/t no durable options past 30d milrinone  use.  Primary team to reach out to other hospice services for discharge, as this is the patients wish. Will discontinue milrinone , with plans to discharge home with hospice.   2. ? Aflutter vs atrial tachycardia - Noted intermittently at [facility] on 5/14. Initially started on amiodarone  gtt and later stopped d/t hypotension. Now on po amiodarone  200 mg daily to reduce risk of recurrent arrhythmias on inotrope support. - Rhythm strips available were reviewed by our team. Felt to be possible AT. No clear role for anticoagulation.  - in NSR on tele   3. Shock Liver - resolved with milrinone , plan as above.    3. AKI on CKD IIIa - resolved, plan as above   4. Hyperkalemia/Hypokalemia - resolved   5. Seropositive Rheumatoid Arthritis  - on Humira  and methotrexate  as outpatient  - Followed by Dr. Rodell Citrin   6. Hx CVA - Weelchair bound, some aphasia - continue aspirin   Length of Stay: 10  [place] Lee, NP  08/25/2023, 9:40 AM  Advanced Heart Failure Team Pager [phone] (M-F; 7a - 5p)  Please contact [facility] for night-coverage after hours (5p -7a ) and weekends on [email or web address]  Patient seen and examined with the above-signed Advanced Practice Provider and/or Housestaff. I personally reviewed laboratory data, imaging studies and relevant notes. I independently examined the patient and formulated the important aspects of the plan. I have edited the note to reflect any of my changes or salient points. I have personally  discussed the plan with the patient and/or family.  She remains on milrinone . Co-ox ok. Renal function ok.   Feels tired. Was refusing meds yesterday. She says she is ready to leave it in God's hands.   General:  Weak appearing. No resp difficulty HEENT: normal Neck: supple. no JVD. Carotids 2+ bilat; no bruits. No lymphadenopathy or thryomegaly appreciated. Cor: PMI nondisplaced. Regular rate & rhythm. No rubs, gallops or murmurs. Lungs: clear Abdomen: soft, nontender, nondistended. No hepatosplenomegaly. No bruits or masses. Good bowel sounds. Extremities: no cyanosis, clubbing, rash, edema Neuro: alert & orientedx3,flat affect weak on left  She is clearly stating that she is tired and is not interested in more aggressive care. She wants to go home and her husband wants her to come home as  well. That said her husband would like her to remain on milrinone .   We attempted to arrange home hospice with home milrinone  with [facility] but her daughter stated clearly that she was not in agreement with Hospice philosophy so that is no longer an option. [facility] is only Hospice program that will approve home milrinone .   So current options are  1. Home with Hospice but no milrinone  2. Home with milrinone  but family would have to cover cost of medicine and infusion (~$2,500/mo)  The HF team has little left to offer. We will sign off at this point.   Jules Oar, MD  12:50 PM

## 2023-08-25 NOTE — TOC Transition Note (Addendum)
 Transition of Care [facility]) - Discharge Note   Patient Details  Name: [name] MRN: [id number] Date of Birth: 18-Sep-1965  Transition of Care [facility]) CM/SW Contact:  Jennett Model, RN Phone Number: 08/25/2023, 2:30 PM   Clinical Narrative:    For dc today home with [facility], she will need ambulance transport.  Picc line will need to be pulled before discharged.  NCM tried to set up transport with [facility] since ptar was so backed up , patient would be number 36 on the list.  We planned for 10 am pickup by [facility] [phone].  Patient's insurance is suppose to call [facility] to set this up, Reservation number is [id number] from [employer] [phone].    1751- NCM received call from [facility] stating she is scheduled for a 2 pm pickup by [facility] tomorrow,  NCM informed MD of this information and the Staff RN and the [facility] Rep.       Barriers to Discharge: Continued Medical Work up   Patient Goals and CMS Choice Patient states their goals for this hospitalization and ongoing recovery are:: ready to go home CMS Medicare.gov Compare Post Acute Care list provided to:: Patient Represenative (must comment) Choice offered to / list presented to : Patient      Discharge Placement                       Discharge Plan and Services Additional resources added to the After Visit Summary for   In-house Referral: Clinical Social Work Discharge Planning Services: CM Consult Post Acute Care Choice: Hospice                    HH Arranged: RN [place] Agency: [facility]) Date HH Agency Contacted: 08/22/23 Time HH Agency Contacted: 1423 Representative spoke with at [facility] Agency: Ardine Beckwith  Social Drivers of Health (SDOH) Interventions SDOH Screenings   Food Insecurity: No Food Insecurity (08/15/2023)  Housing: Low Risk  (08/15/2023)  Transportation Needs: No Transportation Needs (08/15/2023)   Utilities: Not At Risk (08/15/2023)  Alcohol Screen: Low Risk  (08/31/2021)  Depression (PHQ2-9): Low Risk  (08/31/2021)  Financial Resource Strain: Low Risk  (04/07/2023)   Received from [facility]  Physical Activity: Sufficiently Active (08/31/2021)  Recent Concern: Physical Activity - Inactive (07/07/2021)   Received from [facility], [facility]  Social Connections: Unknown (07/08/2022)   Received from [facility], [facility]  Stress: No Stress Concern Present (08/31/2021)  Tobacco Use: High Risk (08/15/2023)     Readmission Risk Interventions    08/15/2023    1:45 PM  Readmission Risk Prevention Plan  Transportation Screening Complete  Home Care Screening Complete  Medication Review (RN CM) Complete

## 2023-08-25 NOTE — Plan of Care (Signed)
  Problem: Education: Goal: Knowledge of General Education information will improve Description: Including pain rating scale, medication(s)/side effects and non-pharmacologic comfort measures Outcome: Progressing   Problem: Health Behavior/Discharge Planning: Goal: Ability to manage health-related needs will improve Outcome: Progressing   Problem: Clinical Measurements: Goal: Ability to maintain clinical measurements within normal limits will improve Outcome: Progressing Goal: Will remain free from infection Outcome: Progressing Goal: Diagnostic test results will improve Outcome: Progressing Goal: Respiratory complications will improve Outcome: Progressing Goal: Cardiovascular complication will be avoided Outcome: Progressing   Problem: Activity: Goal: Risk for activity intolerance will decrease Outcome: Progressing   Problem: Nutrition: Goal: Adequate nutrition will be maintained Outcome: Progressing   Problem: Coping: Goal: Level of anxiety will decrease Outcome: Progressing   Problem: Elimination: Goal: Will not experience complications related to bowel motility Outcome: Progressing Goal: Will not experience complications related to urinary retention Outcome: Progressing   Problem: Pain Managment: Goal: General experience of comfort will improve and/or be controlled Outcome: Progressing   Problem: Safety: Goal: Ability to remain free from injury will improve Outcome: Progressing   Problem: Skin Integrity: Goal: Risk for impaired skin integrity will decrease Outcome: Progressing   Problem: Education: Goal: Ability to demonstrate management of disease process will improve Outcome: Progressing Goal: Ability to verbalize understanding of medication therapies will improve Outcome: Progressing Goal: Individualized Educational Video(s) Outcome: Progressing   Problem: Activity: Goal: Capacity to carry out activities will improve Outcome: Progressing    Problem: Cardiac: Goal: Ability to achieve and maintain adequate cardiopulmonary perfusion will improve Outcome: Progressing   Problem: Education: Goal: Ability to demonstrate management of disease process will improve Outcome: Progressing Goal: Ability to verbalize understanding of medication therapies will improve Outcome: Progressing Goal: Individualized Educational Video(s) Outcome: Progressing   Problem: Activity: Goal: Capacity to carry out activities will improve Outcome: Progressing   Problem: Cardiac: Goal: Ability to achieve and maintain adequate cardiopulmonary perfusion will improve Outcome: Progressing

## 2023-08-25 NOTE — Discharge Summary (Addendum)
 Physician Discharge Summary  [name] ZOX:[id number] DOB: Apr 16, 1965 DOA: 08/14/2023  PCP: [facility]  Admit date: 08/14/2023 Discharge date: 08/25/2023  Time spent: 45 minutes  Recommendations for Outpatient Follow-up:  Home with Hospice for End of life care  Discharge Diagnoses:  Principal Problem:   Acute on chronic diastolic CHF (congestive heart failure) (HCC)   End Stage RV Failure   AKI (acute kidney injury) (HCC)   Atrial flutter (HCC)   Essential hypertension   Acute metabolic encephalopathy   CVA (cerebral vascular accident) (HCC)   UTI (urinary tract infection)   Mixed hyperlipidemia   Acquired hypothyroidism   Class II obesity   Discharge Condition: poor  Diet recommendation: comfort  Filed Weights   08/23/23 0426 08/24/23 0323 08/25/23 0500  Weight: 114.4 kg 114.3 kg 112 kg    History of present illness:  58 y.o. with a history of CVA w/ residual left hemiparesis, HTN, HLD, anemia, seropositive rheumatoid arthritis, and hypothyroidism and recently diagnosed w/ severe RV failure and PH. Admitted for acute on chronic RV failure with Cardiogenic shock.,  Also treated for E. coli UTI -Admitted, started on diuretics, ceftriaxone  - Transferred to [facility], started on milrinone  for low output, end-stage RV failure - Poor prognosis, seen by palliative care consultation, patient and family declined hospice/palliative care   Hospital Course:   End-stage RV failure with cardiogenic shock Pulmonary hypertension Acute on chronic diastolic CHF -Echo noted EF 50-55%, severely reduced RV -Admitted with low output state, lactic acidosis, shock liver and AKI, stabilized on milrinone  -Overall poor prognosis, felt to be end-stage also bedbound, with failure to thrive -Poor prognosis has been discussed with patient and family on multiple locations, seen by palliative care,  -after much back and forth with family, she was declined by  [facility] for Home hospice and Home milrinone  therapy -No payer source for home milrinone  therapy at this time,  reviewed by [facility] and not felt to be an option too. We also discussed out of pocket payor option and this wasn't feasible too. In the Meantime, pt started declining meds for the last 2days, expressed desire to go home and let nature nature its course, after discussion with patient and her spouse by myself and CHF team, now decision is for DC home with hospice off milrinone  therapy for end of life care and DNR. Her mental status is better now and she told me today she doesnt want her daughter making her decisions any more.   AKI (acute kidney injury) (HCC) CKD stage 3a, hyperkalemia/ hypokalemia  hyponatremia.  Resolved   Atrial flutter (HCC) Currently on oral amiodarone  therapy, per cards likely atrial tachycardia, not on anticoagulation   AM Hypoglycemia - Likely secondary to liver disease from RV failure, depleted glycogen stores, shock liver - Added nightly Ensure, improving   Shock liver Improving   Acute metabolic encephalopathy Multifactorial, related to cardiogenic shock Clinically has improved, more alert and appropriate now Cognitive impairment due to history of CVA   H/o CVA (cerebral vascular accident) (HCC) With hemiplegia, speech deficits, cognitive deficits Continue aspirin , statin on hold now   UTI (urinary tract infection) Urine culture positive for  E Coli, sensitive to cephalosporins.  She has completed antibiotic therapy    Mixed hyperlipidemia Continue statin therapy    Acquired hypothyroidism Continue levothyroxine     Class II obesity Calculated BMI is 34.3    Discharge Exam: Vitals:   08/25/23 1105 08/25/23 1210  BP: 115/84   Pulse: 95  Resp: 20   Temp: 98.7 F (37.1 C) 98.7 F (37.1 C)  SpO2: 100% 100%    General: awake alert, oriented to self and place, partly to time Cardiovascular: S1S2/RRR Respiratory: decreased BS at  bases  Discharge Instructions    Allergies as of 08/25/2023       Reactions   Lisinopril  Nausea Only        Medication List     STOP taking these medications    Humira  (2 Pen) 40 MG/0.4ML pen Generic drug: adalimumab        TAKE these medications    acetaminophen  500 MG tablet Commonly known as: TYLENOL  Take 1,000 mg by mouth every 6 (six) hours as needed for mild pain.   amiodarone  200 MG tablet Commonly known as: PACERONE  Take 1 tablet (200 mg total) by mouth daily.   aspirin  EC 81 MG tablet Take 81 mg by mouth daily. Swallow whole.   furosemide  40 MG tablet Commonly known as: LASIX  Take 1 tablet (40 mg total) by mouth daily. What changed:  medication strength how much to take when to take this   levothyroxine  50 MCG tablet Commonly known as: SYNTHROID  Take 50 mcg by mouth daily before breakfast.   midodrine  5 MG tablet Commonly known as: PROAMATINE  Take 1 tablet (5 mg total) by mouth 3 (three) times daily with meals. What changed:  medication strength how much to take   potassium chloride  SA 20 MEQ tablet Commonly known as: KLOR-CON  M Take 1 tablet (20 mEq total) by mouth daily.   traZODone 100 MG tablet Commonly known as: DESYREL Take 100 mg by mouth at bedtime.       Allergies  Allergen Reactions   Lisinopril  Nausea Only    Follow-up Information     [facility] Follow up on 09/18/2023.   Specialty: Cardiology Why: at 3:00 pm Arlin Benes, Entrance C Contact information: [address] [place]  [phone]                 The results of significant diagnostics from this hospitalization (including imaging, microbiology, ancillary and laboratory) are listed below for reference.    Significant Diagnostic Studies: ECHOCARDIOGRAM LIMITED Result Date: 08/17/2023    ECHOCARDIOGRAM LIMITED REPORT   Patient Name:   [name] Date of Exam: 08/17/2023  Medical Rec #:  [id number]    Height:       66.0 in Accession #:    [id number]   Weight:       213.6 lb Date of Birth:  June 29, 1965    BSA:          2.057 m Patient Age:    58 years     BP:           112/72 mmHg Patient Gender: F            HR:           83 bpm. Exam Location:  [facility] Procedure: Limited Echo, Limited Color Doppler and Cardiac Doppler (Both            Spectral and Color Flow Doppler were utilized during procedure). Indications:    Dyspnea R06.00  History:        Patient has prior history of Echocardiogram examinations, most                 recent 07/11/2023. CHF, Stroke; Risk Factors:Hypertension and  Dyslipidemia.  Sonographer:    Denese Finn RCS Referring Phys: [id number] Joyceann No BRANCH IMPRESSIONS  1. Left ventricular ejection fraction, by estimation, is 40 to 45%. The left ventricle has low normal function. Left ventricular endocardial border not optimally defined to evaluate regional wall motion. There is the interventricular septum is flattened  in systole and diastole, consistent with right ventricular pressure and volume overload.  2. Right ventricular systolic function is severely reduced. The right ventricular size is severely enlarged. There is moderately elevated pulmonary artery systolic pressure.  3. The tricuspid valve is abnormal. Tricuspid valve regurgitation is mild to moderate.  4. The inferior vena cava is normal in size with <50% respiratory variability, suggesting right atrial pressure of 8 mmHg.  5. Limited echo to evaluate LV and RV function FINDINGS  Left Ventricle: Left ventricular ejection fraction, by estimation, is 40 to 45%. The left ventricle has low normal function. Left ventricular endocardial border not optimally defined to evaluate regional wall motion. The interventricular septum is flattened in systole and diastole, consistent with right ventricular pressure and volume overload. Right Ventricle: The right ventricular size is severely enlarged.  Right ventricular systolic function is severely reduced. There is moderately elevated pulmonary artery systolic pressure. The tricuspid regurgitant velocity is 3.12 m/s, and with an assumed right atrial pressure of 8 mmHg, the estimated right ventricular systolic pressure is 46.9 mmHg. Tricuspid Valve: The tricuspid valve is abnormal. Tricuspid valve regurgitation is mild to moderate. No evidence of tricuspid stenosis. Venous: The inferior vena cava is normal in size with less than 50% respiratory variability, suggesting right atrial pressure of 8 mmHg. LEFT VENTRICLE PLAX 2D LVIDd:         4.40 cm LVIDs:         3.50 cm LV PW:         1.20 cm LV IVS:        1.10 cm LVOT diam:     2.20 cm LVOT Area:     3.80 cm  LV Volumes (MOD) LV vol d, MOD A2C: 88.7 ml LV vol d, MOD A4C: 76.5 ml LV vol s, MOD A2C: 49.3 ml LV vol s, MOD A4C: 44.9 ml LV SV MOD A2C:     39.4 ml LV SV MOD A4C:     76.5 ml LV SV MOD BP:      37.8 ml RIGHT VENTRICLE TAPSE (M-mode): 1.5 cm LEFT ATRIUM         Index       RIGHT ATRIUM           Index LA diam:    3.40 cm 1.65 cm/m  RA Area:     31.30 cm                                 RA Volume:   131.00 ml 63.70 ml/m   AORTA Ao Root diam: 3.30 cm TRICUSPID VALVE TR Peak grad:   38.9 mmHg TR Vmax:        312.00 cm/s  SHUNTS Systemic Diam: 2.20 cm Armida Lander MD Electronically signed by Armida Lander MD Signature Date/Time: 08/17/2023/2:28:34 PM    Final    DG CHEST PORT 1 VIEW Result Date: 08/16/2023 CLINICAL DATA:  Cough with weakness and shortness of breath. EXAM: PORTABLE CHEST 1 VIEW COMPARISON:  08/15/2023. FINDINGS: Leftward patient rotation. The cardio pericardial silhouette is enlarged. Possible retrocardiac atelectasis or infiltrate. Right lung clear. No acute  bony abnormality. Telemetry leads overlie the chest. IMPRESSION: Rotated film with cardiomegaly. Possible retrocardiac left base atelectasis or infiltrate. Electronically Signed   By: Donnal Fusi M.D.   On: 08/16/2023 06:39    US  EKG SITE RITE Result Date: 08/15/2023 If Site Rite image not attached, placement could not be confirmed due to current cardiac rhythm.  DG Chest Port 1 View Result Date: 08/15/2023 CLINICAL DATA:  Shortness of breath with loss of appetite and diarrhea. EXAM: PORTABLE CHEST 1 VIEW COMPARISON:  July 14, 2023 FINDINGS: The cardiac silhouette is mildly enlarged and unchanged in size. There is no evidence of acute infiltrate, pleural effusion or pneumothorax. The visualized skeletal structures are unremarkable. IMPRESSION: No active cardiopulmonary disease. Electronically Signed   By: Virgle Grime M.D.   On: 08/15/2023 00:30    Microbiology: Recent Results (from the past 240 hours)  MRSA Next Gen by PCR, Nasal     Status: None   Collection Time: 08/15/23  5:09 PM   Specimen: Nasal Mucosa; Nasal Swab  Result Value Ref Range Status   MRSA by PCR Next Gen NOT DETECTED NOT DETECTED Final    Comment: (NOTE) The GeneXpert MRSA Assay (FDA approved for NASAL specimens only), is one component of a comprehensive MRSA colonization surveillance program. It is not intended to diagnose MRSA infection nor to guide or monitor treatment for MRSA infections. Test performance is not FDA approved in patients less than 32 years old. Performed at [facility], [address], [place], [place] [address]   Culture, blood (Routine X 2) w Reflex to ID Panel     Status: None   Collection Time: 08/16/23  6:44 PM   Specimen: BLOOD  Result Value Ref Range Status   Specimen Description BLOOD BLOOD RIGHT HAND  Final   Special Requests   Final    BOTTLES DRAWN AEROBIC ONLY Blood Culture results may not be optimal due to an inadequate volume of blood received in culture bottles   Culture   Final    NO GROWTH 5 DAYS Performed at [facility], [address] [place], [place] [address]    Report Status 08/21/2023 FINAL  Final  Culture, blood (Routine X 2) w Reflex to ID Panel     Status: None   Collection Time:  08/16/23  6:44 PM   Specimen: BLOOD  Result Value Ref Range Status   Specimen Description BLOOD BLOOD LEFT HAND  Final   Special Requests   Final    BOTTLES DRAWN AEROBIC ONLY Blood Culture results may not be optimal due to an inadequate volume of blood received in culture bottles   Culture   Final    NO GROWTH 5 DAYS Performed at [facility], [address], [place], [place] [address]    Report Status 08/21/2023 FINAL  Final     Labs: Basic Metabolic Panel: Recent Labs  Lab 08/20/23 0440 08/21/23 0350 08/22/23 0500 08/24/23 0550 08/25/23 0500  NA 132* 137 136 136 136  K 3.5 4.0 3.5 3.7 4.1  CL 98 103 104 105 105  CO2 27 27 28 27 26   GLUCOSE 189* 84 128* 98 80  BUN 54* 44* 36* 19 14  CREATININE 1.43* 1.17* 0.98 0.68 0.77  CALCIUM  8.4* 8.7* 8.3* 8.3* 8.1*   Liver Function Tests: Recent Labs  Lab 08/19/23 0500 08/20/23 0440 08/21/23 0350 08/22/23 0500 08/24/23 0550  AST 844* 492* 311* 189* 116*  ALT 245* 97* 40 24 21  ALKPHOS 92 88 88 79 64  BILITOT 1.3* 1.5* 1.6* 1.3* 1.0  PROT 9.4* 9.5* 9.8* 8.5* 8.4*  ALBUMIN  1.9* 1.9* 1.9* 1.7* 1.9*   No results for input(s): "LIPASE", "AMYLASE" in the last 168 hours. Recent Labs  Lab 08/22/23 0435  AMMONIA 21   CBC: Recent Labs  Lab 08/21/23 0350 08/22/23 0500 08/23/23 0400 08/24/23 0550 08/25/23 0500  WBC 4.4 3.2* 2.9* 2.2* 2.0*  HGB 12.1 11.1* 11.1* 11.0* 10.3*  HCT 37.6 36.1 34.9* 34.6* 33.8*  MCV 88.3 89.1 88.1 88.7 89.7  PLT 86* 84* 88* 85* 85*   Cardiac Enzymes: No results for input(s): "CKTOTAL", "CKMB", "CKMBINDEX", "TROPONINI" in the last 168 hours. BNP: BNP (last 3 results) Recent Labs    07/09/23 1320 08/14/23 2341  BNP 566.0* 1,054.0*    ProBNP (last 3 results) No results for input(s): "PROBNP" in the last 8760 hours.  CBG: Recent Labs  Lab 08/24/23 0756 08/24/23 0833 08/24/23 1557 08/25/23 0810 08/25/23 1136  GLUCAP 59* 80 74 81 83       Signed:  Deforest Fast MD.  [facility] 08/25/2023, 1:43 PM

## 2023-08-25 NOTE — TOC Progression Note (Addendum)
 Transition of Care [facility]) - Progression Note    Patient Details  Name: [name] MRN: [id number] Date of Birth: 10/25/1965  [facility]) CM/SW Contact  Jennett Model, RN Phone Number: 08/25/2023, 12:20 PM  Clinical Narrative:    MD informed NCM that patient will need new Home Hospice.  NCM offered choice, they chose [facility], NCM made referral to [facility] with [facility].  NCM received call back from [facility] stating they are outside of the service area for them.  NCM went back to speak with husband, so he decided to go with [facility],  NCM made referral to [facility], awaiting a call back, the secretary states they are having difficulty with their communication lines, so she will have to have someone call this NCM back. Still no call from [facility], NCM informed husband he said to try someone else, NCM contacted [facility], spoke with Alexandra Ang at [phone].  He states he will see if they will have a Nurse available today.  Patient will also need ambulance transport when dc.  He will come to the room to speak with patient and spouse tin 10 minutes.  NCM  spoke with Kay Parson earlier to get the cost of the Milrinone  which is close to 2000 to 2500 per month, and they will also have to add in Nursing cost if they decide to pay privately for this.  Per Spouse they can not afford this so the plan per MD , which patient has agreed to  is to turn off milrinone  before discharge home with home hospice.  Loralee Roche Rep will be coming to speak with patient at bedside.   Expected Discharge Plan: Home w Hospice Care Barriers to Discharge: Continued Medical Work up  Expected Discharge Plan and Services In-house Referral: Clinical Social Work Discharge Planning Services: CM Consult Post Acute Care Choice: Hospice Living arrangements for the past 2 months: Single Family Home                           HH Arranged: RN [facility] Agency: [facility]) Date HH Agency Contacted: 08/22/23 Time HH Agency Contacted: 1423 Representative spoke with at [facility] Agency: Ardine Beckwith   Social Determinants of Health (SDOH) Interventions SDOH Screenings   Food Insecurity: No Food Insecurity (08/15/2023)  Housing: Low Risk  (08/15/2023)  Transportation Needs: No Transportation Needs (08/15/2023)  Utilities: Not At Risk (08/15/2023)  Alcohol Screen: Low Risk  (08/31/2021)  Depression (PHQ2-9): Low Risk  (08/31/2021)  Financial Resource Strain: Low Risk  (04/07/2023)   Received from [facility]  Physical Activity: Sufficiently Active (08/31/2021)  Recent Concern: Physical Activity - Inactive (07/07/2021)   Received from [facility], [facility]  Social Connections: Unknown (07/08/2022)   Received from [facility], [facility]  Stress: No Stress Concern Present (08/31/2021)  Tobacco Use: High Risk (08/15/2023)    Readmission Risk Interventions    08/15/2023    1:45 PM  Readmission Risk Prevention Plan  Transportation Screening Complete  Home Care Screening Complete  Medication Review (RN CM) Complete

## 2023-08-25 NOTE — Progress Notes (Signed)
 Daily Progress Note   Patient Name: [name]       Date: 08/25/2023 DOB: April 27, 1965  Age: 58 y.o. MRN#: [id number] Attending Physician: Deforest Fast, MD Primary Care Physician: [facility] Admit Date: 08/14/2023  Reason for Consultation/Follow-up: family requested PMT call  Subjective: Received notification that patient's family called PMT phone line with request for another meeting. Medical records reviewed including progress notes/previous PMT visits, labs and imaging.  Called daughter [name] and she shared that she wanted to ensure patient received outpatient palliative care follow up. She still has questions about home services as well. [name] would like assistance with determining if patient could qualify for medicare early given her situation. I shared that I would pass along the above to NCM and she was appreciative. Discussed with TOC (NCM, HF CM, LCSW) via secure chat.  Patient assessed at the bedside. She is very ready to go home. Her husband is present visiting. I provided her with update on the call I received from her daughter and explored whether she has additional needs or concerns that I could assist with. She does not want her daughter to be involved in medical decision making but did not seem to mind the questions that were asked by daughter. Patient asked about transportation home. This is her priority and she wants to be changed into her gown before leaving. She is ready to "let nature take its course." Husband was wondering about a purewick at discharge and when they may hear back about going home. He also has questions about how PICC will be managed at home. I shared that I would speak with TOC about their questions for transition to home, as well as daughter's questions. Patient and  husband verbalized their understanding.  Per my discussion with primary attending, patient is now agreeable to hospice at home after discussion with her and Dr. Bensimhon.  Questions and concerns addressed. PMT will continue to support holistically.   Length of Stay: 10   Physical Exam Vitals and nursing note reviewed.  Constitutional:      General: She is not in acute distress.    Appearance: She is ill-appearing.  Cardiovascular:     Rate and Rhythm: Normal rate.  Pulmonary:     Effort: Pulmonary effort is normal.  Neurological:     Mental Status: She is alert.  Psychiatric:        Behavior: Behavior normal.             Vital Signs: BP 120/85   Pulse 100   Temp 98.2 F (36.8 C) (Oral)   Resp Aaron Aas)  30   Ht 5\' 6"  (1.676 m)   Wt 112 kg   LMP 07/29/2012   SpO2 100%   BMI 39.87 kg/m  SpO2: SpO2: 100 % O2 Device: O2 Device: Room Air O2 Flow Rate: O2 Flow Rate (L/min): 3 L/min      Palliative Care Assessment & Plan   Patient Profile: 58 y.o. female  with past medical history of HTN, HLD, CVA, arthritis, asthma, CHF, pulmonary hypertension admitted on 08/14/2023 with acute exacerbation of heart failure, elevated troponin secondary to demand ischemia.   Assessment: Acute on Chronic RV Failure Cardiogenic Shock Pulmonary HTN  Recommendations/Plan: Advocated for patient and family's questions regarding insurance and home resources with TOC, their assistance is greatly appreciated Patient's goal is to return home as soon as possible and let nature take its course Psychosocial and emotional support provided PMT remains available as needed   Prognosis:  Poor  Discharge Planning: [facility]  Care plan was discussed with Patient, patient's husband, patient's daughter, [facility], primary hospitalist    Samir Ishaq Alroy Jericho, PA-C  Palliative Medicine Team Team phone # [phone]  Thank you for allowing the Palliative Medicine Team to assist in the care of this  patient. Please utilize secure chat with additional questions, if there is no response within 30 minutes please call the above phone number.  Palliative Medicine Team providers are available by phone from 7am to 7pm daily and can be reached through the team cell phone.  Should this patient require assistance outside of these hours, please call the patient's attending physician.

## 2023-08-25 NOTE — TOC Transition Note (Addendum)
 Transition of Care [facility]) - Discharge Note   Patient Details  Name: [name] MRN: [id number] Date of Birth: [birth year]  Transition of Care [facility] CM/SW Contact:  Jennett Model, RN Phone Number: 08/25/2023, 5:52 PM   Clinical Narrative:    63- NCM received call from [facility] stating she is scheduled for a 2 pm pickup by [facility] tomorrow,  NCM informed MD of this information and the Staff RN and the [facility] Rep, Elizabeth.  He will check to make sure they can have a Nurse there, he will call this NCM back.  Per [facility] states they will have a Nurse available.      Barriers to Discharge: Continued Medical Work up   Patient Goals and CMS Choice Patient states their goals for this hospitalization and ongoing recovery are:: ready to go home CMS Medicare.gov Compare Post Acute Care list provided to:: Patient Represenative (must comment) Choice offered to / list presented to : Patient      Discharge Placement                       Discharge Plan and Services Additional resources added to the After Visit Summary for   In-house Referral: Clinical Social Work Discharge Planning Services: CM Consult Post Acute Care Choice: Hospice                    HH Arranged: RN [facility] Agency: [facility] ([facility]) Date HH Agency Contacted: 08/22/23 Time HH Agency Contacted: 1423 Representative spoke with at [facility] Agency: Ardine Beckwith  Social Drivers of Health (SDOH) Interventions SDOH Screenings   Food Insecurity: No Food Insecurity (08/15/2023)  Housing: Low Risk  (08/15/2023)  Transportation Needs: No Transportation Needs (08/15/2023)  Utilities: Not At Risk (08/15/2023)  Alcohol Screen: Low Risk  (08/31/2021)  Depression (PHQ2-9): Low Risk  (08/31/2021)  Financial Resource Strain: Low Risk  (04/07/2023)   Received from [facility]  Physical Activity: Sufficiently Active (08/31/2021)  Recent Concern:  Physical Activity - Inactive (07/07/2021)   Received from [facility], [facility]  Social Connections: Unknown (07/08/2022)   Received from [facility], [facility]  Stress: No Stress Concern Present (08/31/2021)  Tobacco Use: High Risk (08/15/2023)     Readmission Risk Interventions    08/15/2023    1:45 PM  Readmission Risk Prevention Plan  Transportation Screening Complete  Home Care Screening Complete  Medication Review (RN CM) Complete

## 2023-08-25 NOTE — TOC Progression Note (Signed)
 Transition of Care [facility]) - Progression Note    Patient Details  Name: [name] MRN: [id number] Date of Birth: 12-22-1965  Transition of Care [facility]) CM/SW Contact  Ernst Heap Phone Number: [phone] 08/25/2023, 1:55 PM  Clinical Narrative:   HF CSW met with patient and husband at bedside. CSW discussed the oop cost with the family for home milrinone  and nursing services to present options for the family. CSW shared that the estimate would range from $2000-2500/month per NCM. Family stated at this time that is not something that they could afford. Patient and family understand that going home without being able to afford the milrinone  is the next only option.    CSW notified MD and NCM.     Expected Discharge Plan: Home w Hospice Care Barriers to Discharge: Continued Medical Work up  Expected Discharge Plan and Services In-house Referral: Clinical Social Work Discharge Planning Services: CM Consult Post Acute Care Choice: Hospice Living arrangements for the past 2 months: Single Family Home                           HH Arranged: RN [facility] Agency: [facility] ([facility]) Date HH Agency Contacted: 08/22/23 Time HH Agency Contacted: 1423 Representative spoke with at [facility] Agency: Ardine Beckwith   Social Determinants of Health (SDOH) Interventions SDOH Screenings   Food Insecurity: No Food Insecurity (08/15/2023)  Housing: Low Risk  (08/15/2023)  Transportation Needs: No Transportation Needs (08/15/2023)  Utilities: Not At Risk (08/15/2023)  Alcohol Screen: Low Risk  (08/31/2021)  Depression (PHQ2-9): Low Risk  (08/31/2021)  Financial Resource Strain: Low Risk  (04/07/2023)   Received from [facility]  Physical Activity: Sufficiently Active (08/31/2021)  Recent Concern: Physical Activity - Inactive (07/07/2021)   [facility] from [facility], [facility]  Social Connections: Unknown (07/08/2022)   [facility] from [facility], [facility]  Stress: No Stress Concern Present (08/31/2021)  Tobacco Use: High Risk (08/15/2023)    Readmission Risk Interventions    08/15/2023    1:45 PM  Readmission Risk Prevention Plan  Transportation Screening Complete  Home Care Screening Complete  Medication Review (RN CM) Complete

## 2023-08-25 NOTE — Plan of Care (Signed)

## 2023-08-26 LAB — BASIC METABOLIC PANEL WITH GFR
Anion gap: 3 — ABNORMAL LOW (ref 5–15)
BUN: 12 mg/dL (ref 6–20)
CO2: 29 mmol/L (ref 22–32)
Calcium: 8 mg/dL — ABNORMAL LOW (ref 8.9–10.3)
Chloride: 106 mmol/L (ref 98–111)
Creatinine, Ser: 0.75 mg/dL (ref 0.44–1.00)
GFR, Estimated: >60 mL/min (ref >60)
Glucose, Bld: 81 mg/dL (ref 70–99)
Potassium: 3.7 mmol/L (ref 3.5–5.1)
Sodium: 138 mmol/L (ref 135–145)

## 2023-08-26 LAB — GLUCOSE, CAPILLARY
Glucose-Capillary: 82 mg/dL (ref 70–99)
Glucose-Capillary: 63 mg/dL — ABNORMAL LOW (ref 70–99)
Glucose-Capillary: 68 mg/dL — ABNORMAL LOW (ref 70–99)
Glucose-Capillary: 107 mg/dL — ABNORMAL HIGH (ref 70–99)

## 2023-08-26 LAB — CBC
HCT: 34.3 % — ABNORMAL LOW (ref 36.0–46.0)
Hemoglobin: 10.5 g/dL — ABNORMAL LOW (ref 12.0–15.0)
MCH: 27.3 pg (ref 26.0–34.0)
MCHC: 30.6 g/dL (ref 30.0–36.0)
MCV: 89.1 fL (ref 80.0–100.0)
Platelets: 80 10*3/uL — ABNORMAL LOW (ref 150–400)
RBC: 3.85 MIL/uL — ABNORMAL LOW (ref 3.87–5.11)
RDW: 18.6 % — ABNORMAL HIGH (ref 11.5–15.5)
WBC: 1.7 10*3/uL — ABNORMAL LOW (ref 4.0–10.5)
nRBC: 1.2 % — ABNORMAL HIGH (ref 0.0–0.2)

## 2023-08-26 LAB — COOXEMETRY PANEL
Carboxyhemoglobin: 2.2 % — ABNORMAL HIGH (ref 0.5–1.5)
Methemoglobin: <0.7 % (ref 0.0–1.5)
O2 Saturation: 70 %
Total hemoglobin: 11.1 g/dL — ABNORMAL LOW (ref 12.0–16.0)

## 2023-08-26 NOTE — Progress Notes (Signed)
 Pt seen and examined, spouse at bedside, no changes from a discharge summary yesterday, plan for DC home with home hospice off milrinone , for comfort focused care  Deforest Fast, MD

## 2023-08-26 NOTE — Progress Notes (Signed)
 Per nurse tech, pt refused midnight blood sugar check.

## 2023-08-26 NOTE — TOC Transition Note (Signed)
 Transition of Care [facility]) - Discharge Note   Patient Details  Name: [name] MRN: [id number] Date of Birth: April 16, 1965  [facility]) CM/SW Contact:  Omie Bickers, RN Phone Number: 08/26/2023, 9:01 AM   Clinical Narrative:     Confirmed w [facility] that they have care starting today.  Confirmed with [facility] transport they will pick her up at 2pm.  Nurse updated     Barriers to Discharge: Continued Medical Work up   Patient Goals and CMS Choice Patient states their goals for this hospitalization and ongoing recovery are:: ready to go home CMS Medicare.gov Compare Post Acute Care list provided to:: Patient Represenative (must comment) Choice offered to / list presented to : Patient      Discharge Placement                       Discharge Plan and Services Additional resources added to the After Visit Summary for   In-house Referral: Clinical Social Work Discharge Planning Services: CM Consult Post Acute Care Choice: Hospice                    HH Arranged: RN [facility] Agency: [facility]) Date HH Agency Contacted: 08/22/23 Time HH Agency Contacted: 1423 Representative spoke with at [facility] Agency: Ardine Beckwith  Social Drivers of Health (SDOH) Interventions SDOH Screenings   Food Insecurity: No Food Insecurity (08/15/2023)  Housing: Low Risk  (08/15/2023)  Transportation Needs: No Transportation Needs (08/15/2023)  Utilities: Not At Risk (08/15/2023)  Alcohol Screen: Low Risk  (08/31/2021)  Depression (PHQ2-9): Low Risk  (08/31/2021)  Financial Resource Strain: Low Risk  (04/07/2023)   Received from [facility]  Physical Activity: Sufficiently Active (08/31/2021)  Recent Concern: Physical Activity - Inactive (07/07/2021)   Received from [facility], [facility]  Social Connections: Unknown (07/08/2022)   Received from [facility], [facility]  Stress: No Stress Concern Present (08/31/2021)   Tobacco Use: High Risk (08/15/2023)     Readmission Risk Interventions    08/15/2023    1:45 PM  Readmission Risk Prevention Plan  Transportation Screening Complete  Home Care Screening Complete  Medication Review (RN CM) Complete

## 2023-08-26 NOTE — Progress Notes (Signed)
 [facility] called, confirmed pick up at 2 pm today.   No DC needs.

## 2023-08-26 NOTE — Progress Notes (Signed)
 DISCHARGE NOTE HOME [name] to be discharged Home per MD order. Discussed prescriptions and follow up appointments with the patient. Prescriptions given to patient; medication list explained in detail. Patient verbalized understanding.  Skin clean, dry and intact without evidence of skin break down, no evidence of skin tears noted. IV catheter discontinued intact. Site without signs and symptoms of complications. Dressing and pressure applied. Pt denies pain at the site currently. No complaints noted.  Patient free of lines, drains, and wounds.   An After Visit Summary (AVS) was printed and given to the patient. Patient escorted stretcher via [facility] home  [name] K Layce Sprung, RN

## 2023-08-26 NOTE — Progress Notes (Signed)
 Arrived to room to remove PICC per order.  Patient's family requested that it not be removed until closer to discharge which is 2pm.  Will plan to return at 12pm to remove PICC, primary RN aware and can contact IV team if line needs to be removed sooner.

## 2023-08-26 NOTE — Progress Notes (Signed)
PICC line was removed per order with no complications.  Pt supine and suspended inspiration during removal with instructions to remain supine for 30 minutes after removal.  Pressure held to achieve hemostasis.  Vaseline/gauze/tegaderm applied.  Patient education provided regarding lifting restrictions, site care, and signs of infection.  Pt verbalized understanding and had no questions.

## 2023-08-31 ENCOUNTER — Telehealth: Payer: Self-pay | Admitting: Internal Medicine

## 2023-08-31 NOTE — Telephone Encounter (Signed)
 Patient's daughter [name] called to see if her mom's appointment on 09/06/23 at 3:00 pm could be rescheduled for a later date since she was just discharged from the hospital and is still very weak.

## 2023-09-06 ENCOUNTER — Ambulatory Visit: Admitting: Internal Medicine

## 2023-09-06 DIAGNOSIS — G8929 Other chronic pain: Secondary | ICD-10-CM

## 2023-09-06 DIAGNOSIS — M059 Rheumatoid arthritis with rheumatoid factor, unspecified: Secondary | ICD-10-CM

## 2023-09-06 DIAGNOSIS — Z79899 Other long term (current) drug therapy: Secondary | ICD-10-CM

## 2023-09-18 ENCOUNTER — Encounter (HOSPITAL_COMMUNITY): Admitting: Cardiology

## 2023-10-12 ENCOUNTER — Other Ambulatory Visit: Payer: Self-pay | Admitting: Internal Medicine

## 2023-10-12 DIAGNOSIS — M059 Rheumatoid arthritis with rheumatoid factor, unspecified: Secondary | ICD-10-CM

## 2023-10-13 ENCOUNTER — Other Ambulatory Visit: Payer: Self-pay | Admitting: Internal Medicine

## 2023-10-13 DIAGNOSIS — M059 Rheumatoid arthritis with rheumatoid factor, unspecified: Secondary | ICD-10-CM

## 2023-10-17 ENCOUNTER — Other Ambulatory Visit (HOSPITAL_COMMUNITY): Payer: Self-pay

## 2023-10-26 ENCOUNTER — Other Ambulatory Visit: Payer: Self-pay | Admitting: Internal Medicine

## 2023-10-26 DIAGNOSIS — M059 Rheumatoid arthritis with rheumatoid factor, unspecified: Secondary | ICD-10-CM

## 2023-10-26 NOTE — Telephone Encounter (Signed)
 Last Fill: 07/17/2023, shows discontinued  Labs: 08/26/2023 Calcium  8.0 Anion Gap 3 WBC 1.7 RBC 3.85 Hemoglobin 10.5 HCT 34.3 RDW 18.6 Platelets 80 nRBC 1.2  TB Gold: 11/22/2022 Negative   Next Visit: Due 05/25/2023. Message sent to the front to schedule.   Last Visit: 02/22/2023  IK:Dzmnendpupcz rheumatoid arthritis (HCC)   Current Dose per office note 02/22/2023: Humira  40 mg subcu q. 14 days   Okay to refill Humira ?

## 2023-10-26 NOTE — Telephone Encounter (Signed)
 Please schedule patient a follow up visit. Patient due 05/25/2023. Thanks!

## 2023-10-27 NOTE — Telephone Encounter (Signed)
 Patient was dismissed to hospice and is now bed written, daughter would like to know if this care can be transferred to [facility]. Please advise.

## 2023-10-27 NOTE — Telephone Encounter (Signed)
LMOM for patient to schedule follow up appointment. °

## 2023-10-27 NOTE — Telephone Encounter (Signed)
 Needs follow up to discuss her treatment, recent hospitalization for heart failure.

## 2023-11-02 ENCOUNTER — Other Ambulatory Visit: Payer: Self-pay | Admitting: Internal Medicine

## 2023-11-02 DIAGNOSIS — M059 Rheumatoid arthritis with rheumatoid factor, unspecified: Secondary | ICD-10-CM

## 2023-11-04 ENCOUNTER — Other Ambulatory Visit: Payer: Self-pay | Admitting: Internal Medicine

## 2023-11-04 DIAGNOSIS — M059 Rheumatoid arthritis with rheumatoid factor, unspecified: Secondary | ICD-10-CM

## 2023-11-06 ENCOUNTER — Other Ambulatory Visit: Payer: Self-pay | Admitting: Internal Medicine

## 2023-11-06 DIAGNOSIS — M059 Rheumatoid arthritis with rheumatoid factor, unspecified: Secondary | ICD-10-CM

## 2023-11-06 NOTE — Telephone Encounter (Signed)
 Pharmacy keeps sending over refill for patient. Please advise

## 2023-11-07 NOTE — Telephone Encounter (Signed)
 I am declining her Humira  because of her hospitalization with severe heart failure that is a likely contraindication for this drug. We would at the least have to see her back in the office first before deciding to resume or not.

## 2023-11-09 ENCOUNTER — Other Ambulatory Visit: Payer: Self-pay | Admitting: Internal Medicine

## 2023-11-09 DIAGNOSIS — M059 Rheumatoid arthritis with rheumatoid factor, unspecified: Secondary | ICD-10-CM

## 2023-12-04 DEATH — deceased
